# Patient Record
Sex: Female | Born: 1935
Health system: Southern US, Community
[De-identification: ages and names within clinical notes are randomized; demographics above are authoritative.]

## PROBLEM LIST (undated history)

## (undated) DIAGNOSIS — M8580 Other specified disorders of bone density and structure, unspecified site: Secondary | ICD-10-CM

## (undated) DIAGNOSIS — I10 Essential (primary) hypertension: Secondary | ICD-10-CM

## (undated) DIAGNOSIS — G2581 Restless legs syndrome: Secondary | ICD-10-CM

## (undated) DIAGNOSIS — Q211 Atrial septal defect, unspecified: Secondary | ICD-10-CM

## (undated) DIAGNOSIS — M1990 Unspecified osteoarthritis, unspecified site: Secondary | ICD-10-CM

## (undated) DIAGNOSIS — F329 Major depressive disorder, single episode, unspecified: Secondary | ICD-10-CM

## (undated) DIAGNOSIS — Z860100 Personal history of colon polyps, unspecified: Secondary | ICD-10-CM

## (undated) DIAGNOSIS — F419 Anxiety disorder, unspecified: Secondary | ICD-10-CM

## (undated) DIAGNOSIS — M109 Gout, unspecified: Secondary | ICD-10-CM

## (undated) DIAGNOSIS — D649 Anemia, unspecified: Secondary | ICD-10-CM

## (undated) HISTORY — DX: Personal history of colonic polyps: Z86.010

## (undated) HISTORY — PX: TONSILLECTOMY: SUR1361

## (undated) HISTORY — DX: Gout, unspecified: M10.9

## (undated) HISTORY — DX: Anxiety disorder, unspecified: F41.9

## (undated) HISTORY — DX: Personal history of colon polyps, unspecified: Z86.0100

## (undated) HISTORY — DX: Other specified disorders of bone density and structure, unspecified site: M85.80

## (undated) HISTORY — DX: Essential (primary) hypertension: I10

## (undated) HISTORY — DX: Atrial septal defect, unspecified: Q21.10

## (undated) HISTORY — DX: Atrial septal defect: Q21.1

## (undated) HISTORY — DX: Unspecified osteoarthritis, unspecified site: M19.90

## (undated) HISTORY — PX: BREAST SURGERY: SHX581

## (undated) HISTORY — DX: Major depressive disorder, single episode, unspecified: F32.9

## (undated) HISTORY — DX: Restless legs syndrome: G25.81

## (undated) HISTORY — DX: Anemia, unspecified: D64.9

---

## 1998-11-13 HISTORY — PX: BACK SURGERY: SHX140

## 2004-11-13 LAB — HM COLONOSCOPY: HM Colonoscopy: NORMAL

## 2005-08-14 ENCOUNTER — Ambulatory Visit (HOSPITAL_COMMUNITY): Admission: RE | Admit: 2005-08-14 | Discharge: 2005-08-14 | Payer: Self-pay | Admitting: Gastroenterology

## 2006-06-11 ENCOUNTER — Ambulatory Visit: Payer: Self-pay | Admitting: Internal Medicine

## 2006-08-09 ENCOUNTER — Encounter: Payer: Self-pay | Admitting: Internal Medicine

## 2006-09-10 ENCOUNTER — Ambulatory Visit: Payer: Self-pay | Admitting: Internal Medicine

## 2006-09-18 ENCOUNTER — Ambulatory Visit: Payer: Self-pay | Admitting: Cardiovascular Disease

## 2006-09-26 ENCOUNTER — Ambulatory Visit: Payer: Self-pay | Admitting: Internal Medicine

## 2006-10-09 ENCOUNTER — Ambulatory Visit: Payer: Self-pay

## 2006-10-09 ENCOUNTER — Encounter: Payer: Self-pay | Admitting: Cardiology

## 2006-10-23 ENCOUNTER — Ambulatory Visit: Payer: Self-pay | Admitting: Cardiovascular Disease

## 2006-10-31 ENCOUNTER — Ambulatory Visit: Payer: Self-pay | Admitting: Cardiovascular Disease

## 2006-10-31 ENCOUNTER — Inpatient Hospital Stay (HOSPITAL_BASED_OUTPATIENT_CLINIC_OR_DEPARTMENT_OTHER): Admission: RE | Admit: 2006-10-31 | Discharge: 2006-10-31 | Payer: Self-pay | Admitting: Cardiovascular Disease

## 2006-11-01 ENCOUNTER — Ambulatory Visit: Payer: Self-pay | Admitting: Cardiovascular Disease

## 2006-12-11 ENCOUNTER — Ambulatory Visit: Payer: Self-pay | Admitting: Internal Medicine

## 2006-12-31 ENCOUNTER — Ambulatory Visit: Payer: Self-pay | Admitting: Cardiovascular Disease

## 2007-01-01 ENCOUNTER — Other Ambulatory Visit: Admission: RE | Admit: 2007-01-01 | Discharge: 2007-01-01 | Payer: Self-pay | Admitting: Internal Medicine

## 2007-01-01 ENCOUNTER — Ambulatory Visit: Payer: Self-pay | Admitting: Internal Medicine

## 2007-01-01 ENCOUNTER — Encounter: Payer: Self-pay | Admitting: Internal Medicine

## 2007-01-01 LAB — CONVERTED CEMR LAB
ALT: 14 units/L (ref 0–40)
AST: 24 units/L (ref 0–37)
Basophils Relative: 1.7 % — ABNORMAL HIGH (ref 0.0–1.0)
Cholesterol: 190 mg/dL (ref 0–200)
HCT: 34.9 % — ABNORMAL LOW (ref 36.0–46.0)
HDL: 69.8 mg/dL (ref >39.0)
Hemoglobin: 11.9 g/dL — ABNORMAL LOW (ref 12.0–15.0)
Iron: 96 ug/dL (ref 42–145)
LDL Cholesterol: 107 mg/dL — ABNORMAL HIGH (ref 0–99)
Lymphocytes Relative: 31.4 % (ref 12.0–46.0)
MCHC: 34 g/dL (ref 30.0–36.0)
Monocytes Absolute: 0.4 10*3/uL (ref 0.2–0.7)
Monocytes Relative: 11.1 % — ABNORMAL HIGH (ref 3.0–11.0)
Neutro Abs: 1.6 10*3/uL (ref 1.4–7.7)
Neutrophils Relative %: 53.8 % (ref 43.0–77.0)
Pap Smear: NORMAL
RDW: 13.3 % (ref 11.5–14.6)

## 2007-01-22 ENCOUNTER — Ambulatory Visit: Payer: Self-pay | Admitting: Internal Medicine

## 2007-04-15 DIAGNOSIS — I10 Essential (primary) hypertension: Secondary | ICD-10-CM

## 2007-04-15 DIAGNOSIS — Z860100 Personal history of colon polyps, unspecified: Secondary | ICD-10-CM | POA: Insufficient documentation

## 2007-04-15 DIAGNOSIS — M8580 Other specified disorders of bone density and structure, unspecified site: Secondary | ICD-10-CM

## 2007-04-23 ENCOUNTER — Ambulatory Visit: Payer: Self-pay | Admitting: Internal Medicine

## 2007-04-23 DIAGNOSIS — I429 Cardiomyopathy, unspecified: Secondary | ICD-10-CM

## 2007-04-23 LAB — CONVERTED CEMR LAB: Rh Type: POSITIVE

## 2007-04-25 LAB — CONVERTED CEMR LAB
BUN: 26 mg/dL — ABNORMAL HIGH (ref 6–23)
Calcium: 10.6 mg/dL — ABNORMAL HIGH (ref 8.4–10.5)
Chloride: 104 meq/L (ref 96–112)
GFR calc Af Amer: 62 mL/min
GFR calc non Af Amer: 51 mL/min

## 2007-06-06 ENCOUNTER — Telehealth (INDEPENDENT_AMBULATORY_CARE_PROVIDER_SITE_OTHER): Payer: Self-pay | Admitting: *Deleted

## 2007-06-18 ENCOUNTER — Ambulatory Visit: Payer: Self-pay | Admitting: Cardiovascular Disease

## 2007-06-18 LAB — CONVERTED CEMR LAB
BUN: 26 mg/dL — ABNORMAL HIGH (ref 6–23)
Calcium: 10 mg/dL (ref 8.4–10.5)
GFR calc Af Amer: 56 mL/min
GFR calc non Af Amer: 46 mL/min
Potassium: 4.3 meq/L (ref 3.5–5.1)

## 2007-06-28 ENCOUNTER — Ambulatory Visit: Payer: Self-pay

## 2007-07-04 ENCOUNTER — Ambulatory Visit: Payer: Self-pay | Admitting: Cardiovascular Disease

## 2007-08-10 ENCOUNTER — Emergency Department (HOSPITAL_COMMUNITY): Admission: EM | Admit: 2007-08-10 | Discharge: 2007-08-10 | Payer: Self-pay | Admitting: Emergency Medicine

## 2007-08-26 ENCOUNTER — Encounter: Payer: Self-pay | Admitting: Internal Medicine

## 2007-08-28 ENCOUNTER — Telehealth (INDEPENDENT_AMBULATORY_CARE_PROVIDER_SITE_OTHER): Payer: Self-pay | Admitting: *Deleted

## 2007-10-08 ENCOUNTER — Telehealth: Payer: Self-pay | Admitting: Internal Medicine

## 2007-10-21 ENCOUNTER — Ambulatory Visit: Payer: Self-pay | Admitting: Internal Medicine

## 2007-10-21 ENCOUNTER — Telehealth: Payer: Self-pay | Admitting: Internal Medicine

## 2007-10-23 ENCOUNTER — Encounter: Payer: Self-pay | Admitting: Internal Medicine

## 2007-11-12 ENCOUNTER — Ambulatory Visit: Payer: Self-pay

## 2007-11-12 ENCOUNTER — Encounter: Payer: Self-pay | Admitting: Internal Medicine

## 2007-12-03 ENCOUNTER — Telehealth (INDEPENDENT_AMBULATORY_CARE_PROVIDER_SITE_OTHER): Payer: Self-pay | Admitting: *Deleted

## 2007-12-19 ENCOUNTER — Encounter: Payer: Self-pay | Admitting: Internal Medicine

## 2008-01-23 ENCOUNTER — Telehealth (INDEPENDENT_AMBULATORY_CARE_PROVIDER_SITE_OTHER): Payer: Self-pay | Admitting: *Deleted

## 2008-01-24 ENCOUNTER — Ambulatory Visit: Payer: Self-pay | Admitting: Internal Medicine

## 2008-01-24 DIAGNOSIS — R21 Rash and other nonspecific skin eruption: Secondary | ICD-10-CM | POA: Insufficient documentation

## 2008-03-09 ENCOUNTER — Ambulatory Visit: Payer: Self-pay | Admitting: Internal Medicine

## 2008-03-11 ENCOUNTER — Encounter (INDEPENDENT_AMBULATORY_CARE_PROVIDER_SITE_OTHER): Payer: Self-pay | Admitting: *Deleted

## 2008-03-12 ENCOUNTER — Telehealth (INDEPENDENT_AMBULATORY_CARE_PROVIDER_SITE_OTHER): Payer: Self-pay | Admitting: *Deleted

## 2008-03-13 ENCOUNTER — Ambulatory Visit: Payer: Self-pay | Admitting: Internal Medicine

## 2008-03-13 LAB — CONVERTED CEMR LAB
AST: 25 units/L (ref 0–37)
Basophils Absolute: 0 10*3/uL (ref 0.0–0.1)
Basophils Relative: 0.2 % (ref 0.0–1.0)
CO2: 31 meq/L (ref 19–32)
Calcium: 9.8 mg/dL (ref 8.4–10.5)
Cholesterol: 177 mg/dL (ref 0–200)
Creatinine, Ser: 1.1 mg/dL (ref 0.4–1.2)
GFR calc Af Amer: 62 mL/min
HCT: 34.5 % — ABNORMAL LOW (ref 36.0–46.0)
Hemoglobin: 11.7 g/dL — ABNORMAL LOW (ref 12.0–15.0)
LDL Cholesterol: 98 mg/dL (ref 0–99)
Lymphocytes Relative: 34.7 % (ref 12.0–46.0)
MCHC: 34 g/dL (ref 30.0–36.0)
MCV: 97.4 fL (ref 78.0–100.0)
Monocytes Absolute: 0.4 10*3/uL (ref 0.1–1.0)
Neutro Abs: 2.2 10*3/uL (ref 1.4–7.7)
RBC: 3.54 M/uL — ABNORMAL LOW (ref 3.87–5.11)
Triglycerides: 43 mg/dL (ref 0–149)

## 2008-03-30 ENCOUNTER — Ambulatory Visit: Payer: Self-pay | Admitting: Internal Medicine

## 2008-03-30 LAB — CONVERTED CEMR LAB
OCCULT 1: NEGATIVE
OCCULT 2: NEGATIVE

## 2008-04-03 ENCOUNTER — Encounter (INDEPENDENT_AMBULATORY_CARE_PROVIDER_SITE_OTHER): Payer: Self-pay | Admitting: *Deleted

## 2008-04-13 ENCOUNTER — Encounter (INDEPENDENT_AMBULATORY_CARE_PROVIDER_SITE_OTHER): Payer: Self-pay | Admitting: *Deleted

## 2008-04-14 ENCOUNTER — Telehealth (INDEPENDENT_AMBULATORY_CARE_PROVIDER_SITE_OTHER): Payer: Self-pay | Admitting: *Deleted

## 2008-04-15 ENCOUNTER — Ambulatory Visit: Payer: Self-pay | Admitting: Internal Medicine

## 2008-05-04 ENCOUNTER — Telehealth (INDEPENDENT_AMBULATORY_CARE_PROVIDER_SITE_OTHER): Payer: Self-pay | Admitting: *Deleted

## 2008-05-07 ENCOUNTER — Encounter (INDEPENDENT_AMBULATORY_CARE_PROVIDER_SITE_OTHER): Payer: Self-pay | Admitting: *Deleted

## 2008-06-23 ENCOUNTER — Ambulatory Visit: Payer: Self-pay | Admitting: Cardiovascular Disease

## 2008-08-04 ENCOUNTER — Encounter (INDEPENDENT_AMBULATORY_CARE_PROVIDER_SITE_OTHER): Payer: Self-pay | Admitting: *Deleted

## 2008-08-11 ENCOUNTER — Telehealth (INDEPENDENT_AMBULATORY_CARE_PROVIDER_SITE_OTHER): Payer: Self-pay | Admitting: *Deleted

## 2008-08-31 ENCOUNTER — Encounter: Payer: Self-pay | Admitting: Internal Medicine

## 2008-08-31 LAB — HM MAMMOGRAPHY: HM Mammogram: NORMAL

## 2008-09-07 ENCOUNTER — Encounter (INDEPENDENT_AMBULATORY_CARE_PROVIDER_SITE_OTHER): Payer: Self-pay | Admitting: *Deleted

## 2008-09-08 ENCOUNTER — Ambulatory Visit: Payer: Self-pay | Admitting: Internal Medicine

## 2008-09-10 LAB — CONVERTED CEMR LAB
GFR calc Af Amer: 56 mL/min
GFR calc non Af Amer: 46 mL/min
Potassium: 4.2 meq/L (ref 3.5–5.1)
Saturation Ratios: 26 % (ref 20.0–50.0)
Sodium: 142 meq/L (ref 135–145)
Transferrin: 272.1 mg/dL (ref >=212.0)

## 2008-09-11 ENCOUNTER — Encounter (INDEPENDENT_AMBULATORY_CARE_PROVIDER_SITE_OTHER): Payer: Self-pay | Admitting: *Deleted

## 2008-09-18 ENCOUNTER — Encounter: Payer: Self-pay | Admitting: Internal Medicine

## 2008-10-12 ENCOUNTER — Encounter: Payer: Self-pay | Admitting: Internal Medicine

## 2008-12-11 ENCOUNTER — Ambulatory Visit: Payer: Self-pay | Admitting: Family Medicine

## 2008-12-17 ENCOUNTER — Emergency Department (HOSPITAL_COMMUNITY): Admission: EM | Admit: 2008-12-17 | Discharge: 2008-12-18 | Payer: Self-pay | Admitting: Emergency Medicine

## 2008-12-23 ENCOUNTER — Encounter: Payer: Self-pay | Admitting: Internal Medicine

## 2008-12-31 ENCOUNTER — Encounter: Payer: Self-pay | Admitting: Internal Medicine

## 2009-01-07 ENCOUNTER — Encounter: Payer: Self-pay | Admitting: Internal Medicine

## 2009-02-10 ENCOUNTER — Encounter: Payer: Self-pay | Admitting: Internal Medicine

## 2009-03-09 ENCOUNTER — Ambulatory Visit: Payer: Self-pay | Admitting: Internal Medicine

## 2009-03-15 LAB — CONVERTED CEMR LAB
BUN: 31 mg/dL — ABNORMAL HIGH (ref 6–23)
Calcium: 10.4 mg/dL (ref 8.4–10.5)
Cholesterol: 205 mg/dL — ABNORMAL HIGH (ref 0–200)
Creatinine, Ser: 1.3 mg/dL — ABNORMAL HIGH (ref 0.4–1.2)
GFR calc non Af Amer: 50.72 mL/min (ref >60)

## 2009-05-28 ENCOUNTER — Encounter: Payer: Self-pay | Admitting: Internal Medicine

## 2009-07-10 DIAGNOSIS — G2581 Restless legs syndrome: Secondary | ICD-10-CM

## 2009-07-10 HISTORY — DX: Restless legs syndrome: G25.81

## 2009-07-16 DIAGNOSIS — F329 Major depressive disorder, single episode, unspecified: Secondary | ICD-10-CM | POA: Insufficient documentation

## 2009-07-16 DIAGNOSIS — F419 Anxiety disorder, unspecified: Secondary | ICD-10-CM

## 2009-07-16 DIAGNOSIS — F32A Depression, unspecified: Secondary | ICD-10-CM

## 2009-07-16 HISTORY — DX: Depression, unspecified: F32.A

## 2009-07-16 HISTORY — DX: Anxiety disorder, unspecified: F41.9

## 2009-07-20 ENCOUNTER — Ambulatory Visit: Payer: Self-pay | Admitting: Cardiovascular Disease

## 2009-07-20 DIAGNOSIS — I493 Ventricular premature depolarization: Secondary | ICD-10-CM

## 2009-07-30 ENCOUNTER — Telehealth: Payer: Self-pay | Admitting: Cardiovascular Disease

## 2009-08-04 ENCOUNTER — Ambulatory Visit: Payer: Self-pay

## 2009-08-04 ENCOUNTER — Encounter: Payer: Self-pay | Admitting: Cardiovascular Disease

## 2009-08-09 ENCOUNTER — Telehealth: Payer: Self-pay | Admitting: Cardiovascular Disease

## 2009-08-12 ENCOUNTER — Ambulatory Visit: Payer: Self-pay | Admitting: Internal Medicine

## 2009-08-19 ENCOUNTER — Encounter (INDEPENDENT_AMBULATORY_CARE_PROVIDER_SITE_OTHER): Payer: Self-pay | Admitting: *Deleted

## 2009-08-19 LAB — CONVERTED CEMR LAB
Basophils Absolute: 0.1 10*3/uL (ref 0.0–0.1)
CO2: 30 meq/L (ref 19–32)
GFR calc non Af Amer: 50.67 mL/min (ref >60)
Glucose, Bld: 76 mg/dL (ref 70–99)
HCT: 36.2 % (ref 36.0–46.0)
Hemoglobin: 12.4 g/dL (ref 12.0–15.0)
LDL Cholesterol: 117 mg/dL — ABNORMAL HIGH (ref 0–99)
Lymphs Abs: 1.9 10*3/uL (ref 0.7–4.0)
MCHC: 34.2 g/dL (ref 30.0–36.0)
Monocytes Relative: 10.5 % (ref 3.0–12.0)
Neutro Abs: 2.5 10*3/uL (ref 1.4–7.7)
Potassium: 4.8 meq/L (ref 3.5–5.1)
RDW: 13 % (ref 11.5–14.6)
Sodium: 141 meq/L (ref 135–145)
VLDL: 11.8 mg/dL (ref 0.0–40.0)

## 2009-09-01 ENCOUNTER — Encounter: Payer: Self-pay | Admitting: Internal Medicine

## 2009-12-01 ENCOUNTER — Ambulatory Visit: Payer: Self-pay | Admitting: Cardiovascular Disease

## 2010-02-09 ENCOUNTER — Ambulatory Visit: Payer: Self-pay | Admitting: Internal Medicine

## 2010-02-09 DIAGNOSIS — M1990 Unspecified osteoarthritis, unspecified site: Secondary | ICD-10-CM

## 2010-02-12 LAB — CONVERTED CEMR LAB
BUN: 37 mg/dL — ABNORMAL HIGH (ref 6–23)
CO2: 29 meq/L (ref 19–32)
Calcium: 10.2 mg/dL (ref 8.4–10.5)
Chloride: 103 meq/L (ref 96–112)
Creatinine, Ser: 1.3 mg/dL — ABNORMAL HIGH (ref 0.4–1.2)

## 2010-02-28 ENCOUNTER — Telehealth (INDEPENDENT_AMBULATORY_CARE_PROVIDER_SITE_OTHER): Payer: Self-pay | Admitting: *Deleted

## 2010-04-25 ENCOUNTER — Telehealth: Payer: Self-pay | Admitting: Internal Medicine

## 2010-06-29 ENCOUNTER — Encounter: Payer: Self-pay | Admitting: Internal Medicine

## 2010-06-29 ENCOUNTER — Ambulatory Visit: Payer: Self-pay | Admitting: Internal Medicine

## 2010-07-25 ENCOUNTER — Encounter: Payer: Self-pay | Admitting: Internal Medicine

## 2010-08-03 ENCOUNTER — Other Ambulatory Visit: Admission: RE | Admit: 2010-08-03 | Discharge: 2010-08-03 | Payer: Self-pay | Admitting: Internal Medicine

## 2010-08-03 ENCOUNTER — Ambulatory Visit: Payer: Self-pay | Admitting: Internal Medicine

## 2010-08-03 DIAGNOSIS — L989 Disorder of the skin and subcutaneous tissue, unspecified: Secondary | ICD-10-CM | POA: Insufficient documentation

## 2010-08-03 LAB — CONVERTED CEMR LAB: Vit D, 25-Hydroxy: 64 ng/mL (ref 30–89)

## 2010-08-03 LAB — HM PAP SMEAR: HM Pap smear: NORMAL

## 2010-08-08 LAB — CONVERTED CEMR LAB
ALT: 11 units/L (ref 0–35)
Chloride: 105 meq/L (ref 96–112)
Direct LDL: 123.7 mg/dL
GFR calc non Af Amer: 53.38 mL/min (ref >60)
Hemoglobin: 11.6 g/dL — ABNORMAL LOW (ref 12.0–15.0)
Pap Smear: NEGATIVE
Potassium: 4.1 meq/L (ref 3.5–5.1)
Sodium: 141 meq/L (ref 135–145)
Total CHOL/HDL Ratio: 3

## 2010-08-10 ENCOUNTER — Encounter: Payer: Self-pay | Admitting: Internal Medicine

## 2010-08-30 ENCOUNTER — Encounter: Payer: Self-pay | Admitting: Internal Medicine

## 2010-08-30 LAB — HM COLONOSCOPY

## 2010-09-07 ENCOUNTER — Encounter: Payer: Self-pay | Admitting: Internal Medicine

## 2010-09-12 ENCOUNTER — Encounter: Payer: Self-pay | Admitting: Internal Medicine

## 2010-09-27 ENCOUNTER — Telehealth: Payer: Self-pay | Admitting: Internal Medicine

## 2010-10-26 ENCOUNTER — Telehealth: Payer: Self-pay | Admitting: Internal Medicine

## 2010-12-04 ENCOUNTER — Encounter: Payer: Self-pay | Admitting: Cardiovascular Disease

## 2010-12-05 ENCOUNTER — Ambulatory Visit
Admission: RE | Admit: 2010-12-05 | Discharge: 2010-12-05 | Payer: Self-pay | Source: Home / Self Care | Attending: Internal Medicine | Admitting: Internal Medicine

## 2010-12-07 ENCOUNTER — Ambulatory Visit: Admission: RE | Admit: 2010-12-07 | Discharge: 2010-12-07 | Payer: Self-pay | Source: Home / Self Care

## 2010-12-07 ENCOUNTER — Encounter: Payer: Self-pay | Admitting: Cardiovascular Disease

## 2010-12-12 ENCOUNTER — Ambulatory Visit: Admission: RE | Admit: 2010-12-12 | Discharge: 2010-12-12 | Payer: Self-pay | Source: Home / Self Care

## 2010-12-12 ENCOUNTER — Ambulatory Visit (HOSPITAL_COMMUNITY)
Admission: RE | Admit: 2010-12-12 | Discharge: 2010-12-12 | Payer: Self-pay | Source: Home / Self Care | Attending: Cardiovascular Disease | Admitting: Cardiovascular Disease

## 2010-12-13 NOTE — Progress Notes (Signed)
Summary: venlafaxine rx should be xr cap NOT Venlafaxine hcl tab  Phone Note From Pharmacy   Caller: [facility]bbie [facility] pharmacist [facility] [address]  Summary of Call: pt questioning rx Venlafaxine rx. Has always been on the xr capsules, now rx given was for Venlafaxine Hcl tab. Per 07/20/09 med list rx was changed by cardilogy at office visit. Pt has always been on the XR capsule.  Pharmacy informed and rx changed back to xr Initial call taken by: Kandice Hams,  February 28, 2010 4:20 PM    New/Updated Medications: VENLAFAXINE HCL 75 MG XR24H-CAP (VENLAFAXINE HCL) 1 by mouth once daily

## 2010-12-13 NOTE — Progress Notes (Signed)
Summary: Refill Request  Phone Note Refill Request Call back at [phone] Message from:  Pharmacy on April 25, 2010 10:09 AM  Refills Requested: Medication #1:  KLONOPIN 0.5 MG TABS Take 1/2 tablet by mouth at bedtime   Dosage confirmed as above?Dosage Confirmed   Supply Requested: 1 month   Last Refilled: 02/23/2010 [facility]  Next Appointment Scheduled: Sept 21st 2011 Initial call taken by: Harold Barban,  April 25, 2010 10:10 AM  Follow-up for Phone Call        last filled 09-02-09 #30 3 refills, last OV 02-09-29 .....Marland KitchenMarland KitchenFelecia Deloach CMA  April 25, 2010 10:19 AM   ok 30 and 3 RF Jose E. Paz MD  April 25, 2010 11:24 AM      Prescriptions: KLONOPIN 0.5 MG TABS (CLONAZEPAM) Take 1/2 tablet by mouth at bedtime  #30 x 3   Entered by:   Jeremy Johann CMA   Authorized by:   Nolon Rod. Paz MD   Signed by:   Jeremy Johann CMA on 04/25/2010   Method used:   Printed then faxed to ...       [facility] (retail)       [phone]       [place], [place]  [address]       Ph: [phone]       Fax: [phone]   RxID:   [id number]

## 2010-12-13 NOTE — Assessment & Plan Note (Signed)
Summary: 6 MONTH FOLLOWUP/ALR   Vital Signs:  Patient profile:   77 year old female Height:      64 inches Weight:      145.8 pounds BMI:     25.12 Pulse rate:   70 / minute BP sitting:   124 / 62  Vitals Entered By: Shary Decamp (February 09, 2010 10:02 AM) CC: rov - fasting   History of Present Illness: routine office visit CARDIOMYOPATHY--cardiology note from January reviewed, stable    Current Medications (verified): 1)  Lisinopril-Hydrochlorothiazide 20-12.5 Mg Tabs (Lisinopril-Hydrochlorothiazide) .... Take 2 Tablet By Mouth Once A Day 2)  Losartan Potassium 100 Mg Tabs (Losartan Potassium) .Marland Kitchen.. 1 Tab By Mouth Once Daily 3)  Klonopin 0.5 Mg Tabs (Clonazepam) .... Take 1/2 Tablet By Mouth At Bedtime 4)  Venlafaxine Hcl 75 Mg Tabs (Venlafaxine Hcl) .Marland Kitchen.. 1 Tab By Mouth Once Daily 5)  Mirtazapine 30 Mg Tabs (Mirtazapine) .... 2by Mouth At Bedtime [phone] 6)  Aspirin 325 Mg Tabs (Aspirin) .Marland Kitchen.. 1 Tab By Mouth Once Daily 7)  Multivitamins  Tabs (Multiple Vitamin) .Marland Kitchen.. 1 Tab By Mouth Once Daily 8)  Calcium Carbonate 600 Mg Tabs (Calcium Carbonate) .... 2 By Mouth Qd 9)  Metamucil 30.9 % Powd (Psyllium) .... As Needed 10)  Echinacea (During Flu Season) .... Seasonal As Needed 11)  Vitamin D .... 1 Tab By Mouth Once Daily 12)  Coreg 12.5 Mg Tabs (Carvedilol) .... Take One Tablet By Mouth Twice A Day 13)  Voltaren 1 % Gel (Diclofenac Sodium) .... As Directed  Allergies (verified): 1)  ! Codeine 2)  ! Hydrocodone 3)  ! Adhesive Tape 4)  ! * Shellfish  Past History:  Past Medical History: CARDIOMYOPATHY nonischemic cardiomyopathy.  She had a normal cath in December 2007, followup MUGA scan showed an EF improving to 52% from 40%.  h/o ASD per Bubble study 2004     Colonic polyps, hx of  Depression  Hypertension  Osteopenia Anemia  Osteoarthritis  Past Surgical History: Reviewed history from 08/12/2009 and no changes required. back surgery --2000--due to a fall Breast  Bx (-) tonsillectomy   Social History: Reviewed history from 03/09/2008 and no changes required. Never Smoked Alcohol use-no Widow , moved to [place] 2007 (has a daughter in town) Live by self motivational speaker  Review of Systems       feels very well more active in the last few months, has noted some weight loss her diet remains excellent, she eats a lot of fruits, vegetables, fish denies chest pain, lower extremity edema or difficulty breathing sometimes her DIPs  in both hands is a little numb, symptoms decrease with massage.  Denies redness or puffiness in any of her hands joints or wrists;no actual  numbness in her hands  Physical Exam  General:  alert, well-developed, and well-nourished.   Lungs:  normal respiratory effort, no intercostal retractions, no accessory muscle use, and normal breath sounds.   Heart:  normal rate, regular rhythm, and no murmur.   Extremities:  no lower extremity edema inspection on palpation of the hands and wrists is normal except for some changes consistent with OA, no puffiness or redness   Impression & Recommendations:  Problem # 1:  OSTEOARTHRITIS (ICD-715.90) finger numbness likely OA, observe Her updated medication list for this problem includes:    Aspirin 325 Mg Tabs (Aspirin) .Marland Kitchen... 1 tab by mouth once daily  Problem # 2:  HYPERTENSION (ICD-401.9) at goal  Her updated medication list for this problem  includes:    Lisinopril-hydrochlorothiazide 20-12.5 Mg Tabs (Lisinopril-hydrochlorothiazide) .Marland Kitchen... Take 2 tablet by mouth once a day    Losartan Potassium 100 Mg Tabs (Losartan potassium) .Marland Kitchen... 1 tab by mouth once daily    Coreg 12.5 Mg Tabs (Carvedilol) .Marland Kitchen... Take one tablet by mouth twice a day  BP today: 124/62 Prior BP: 126/75 (12/01/2009)  Labs Reviewed: K+: 4.8 (08/12/2009) Creat: : 1.3 (08/12/2009)   Chol: 187 (08/12/2009)   HDL: 57.90 (08/12/2009)   LDL: 117 (08/12/2009)   TG: 59.0 (08/12/2009)  Orders: Venipuncture  ([address]) TLB-BMP (Basic Metabolic Panel-BMET) (80048-METABOL)  Problem # 3:  CARDIOMYOPATHY (ICD-425.4) stable   Complete Medication List: 1)  Lisinopril-hydrochlorothiazide 20-12.5 Mg Tabs (Lisinopril-hydrochlorothiazide) .... Take 2 tablet by mouth once a day 2)  Losartan Potassium 100 Mg Tabs (Losartan potassium) .Marland Kitchen.. 1 tab by mouth once daily 3)  Coreg 12.5 Mg Tabs (Carvedilol) .... Take one tablet by mouth twice a day 4)  Klonopin 0.5 Mg Tabs (Clonazepam) .... Take 1/2 tablet by mouth at bedtime 5)  Venlafaxine Hcl 75 Mg Tabs (Venlafaxine hcl) .Marland Kitchen.. 1 tab by mouth once daily 6)  Mirtazapine 30 Mg Tabs (Mirtazapine) .... 2by mouth at bedtime [phone] 7)  Aspirin 325 Mg Tabs (Aspirin) .Marland Kitchen.. 1 tab by mouth once daily 8)  Multivitamins Tabs (Multiple vitamin) .Marland Kitchen.. 1 tab by mouth once daily 9)  Calcium Carbonate 600 Mg Tabs (Calcium carbonate) .... 2 by mouth qd 10)  Metamucil 30.9 % Powd (Psyllium) .... As needed 11)  Echinacea (during Flu Season)  .... Seasonal as needed 12)  Vitamin D  .... 1 tab by mouth once daily 13)  Voltaren 1 % Gel (Diclofenac sodium) .... As directed  Patient Instructions: 1)  Please schedule a follow-up appointment in 6 months ( fasting, Yearly checkup) Prescriptions: VENLAFAXINE HCL 75 MG TABS (VENLAFAXINE HCL) 1 tab by mouth once daily  #30 Capsule x 6   Entered by:   Shary Decamp   Authorized by:   Nolon Rod. Paz MD   Signed by:   Shary Decamp on 02/09/2010   Method used:   Electronically to        [id number] (retail)       [address]       [place], [place]  [address]       Ph: [phone]       Fax: [phone]   RxID:   [id number] COREG 12.5 MG TABS (CARVEDILOL) take one tablet by mouth twice a day  #60 x 6   Entered by:   Shary Decamp   Authorized by:   Nolon Rod. Paz MD   Signed by:   Shary Decamp on 02/09/2010   Method used:   Electronically to        [id number] (retail)       [address]       [place], [place]  [address]       Ph: [phone]       Fax: [phone]   RxID:   [id number] LISINOPRIL-HYDROCHLOROTHIAZIDE 20-12.5 MG TABS (LISINOPRIL-HYDROCHLOROTHIAZIDE) Take 2 tablet by mouth once a day  #60 Tablet x 6   Entered by:   Shary Decamp   Authorized by:   Nolon Rod. Paz MD   Signed by:   Shary Decamp on 02/09/2010   Method used:   Electronically to        [id number] (retail)  [address]       [place], [place]  [address]       Ph: [phone]       Fax: [phone]   RxID:   [id number]

## 2010-12-13 NOTE — Progress Notes (Signed)
Summary: Question on meds  Phone Note Call from Patient Call back at Home Phone [phone]   Summary of Call: Patient called a little confused about being on Losartan and Lisinopril. The pharmacist mentioned the meds and she wanted to be sure she was taking them. I made the patient aware to continue the meds (she has been on both for a great while) and call us if she has any problems. She notes that she feels fine and expressed understanding. Initial call taken by: Lucious Groves CMA,  September 27, 2010 11:13 AM

## 2010-12-13 NOTE — Letter (Signed)
Summary: return PRN  [facility]   Imported By: Lanelle Bal 08/04/2010 08:27:12  _____________________________________________________________________  External Attachment:    Type:   Image     Comment:   External Document

## 2010-12-13 NOTE — Assessment & Plan Note (Signed)
Summary: cpx/kdc   Vital Signs:  Patient profile:   77 year old female Height:      64 inches Weight:      145.13 pounds Pulse rate:   81 / minute Pulse rhythm:   regular BP sitting:   126 / 88  (left arm) Cuff size:   large  Vitals Entered By: Army Fossa CMA (August 03, 2010 9:01 AM) CC: CPX, fasting Comments her BP cuff read 134/81 pap if insurance will cover- has medicare flu shot pharm- [facility]   History of Present Illness: Here for Medicare AWV: 1.   Risk factors based on Past M, S, F history: reviewed  2.   Physical Activities: [facility] the [facility]  3.   Depression/mood: symptoms well controlled w/ effexor  4.   Hearing: no problems reported or noted  5.   ADL's: totally independent  6.   Fall Risk: no recent fall, low risk  7.   Home Safety: does feel safe ar home  8.   Height, weight, &visual acuity: see VS, saw the eye doctor 2 days ago, vision well corrected                w/ glasses  9.   Counseling: yes, see a/p  10.   Labs ordered based on risk factors: yes  11.           Referral Coordination: yes , see below  12.           Care Plan:see a/p  13.            Cognitive Assessment.. motor skills, memory and cognition seemed appropriate    in addition we discussed the following issues --has 2 skin lesion , new x few months -- saw urology few days ago in ref. to abnormal urinary stream, a bladder scan was done and it was ok. in the past she used a hormonal cream w/o much help and it was expensive. She is to return to urology as needed; no h/o recurrent UTIs    --Colonic polyps, got a letter from GI...should I proceed w/ Cscope? -- Hypertension-- ambulatory BPs 120-130/70 -- Osteopenia, records reviewed, DEXA normal 06-2010      Preventive Screening-Counseling & Management  Caffeine-Diet-Exercise     Does Patient Exercise: yes     Times/week: 3  Current Medications (verified): 1)  Lisinopril-Hydrochlorothiazide 20-12.5 Mg Tabs  (Lisinopril-Hydrochlorothiazide) .... Take 2 Tablet By Mouth Once A Day 2)  Losartan Potassium 100 Mg Tabs (Losartan Potassium) .Marland Kitchen.. 1 Tab By Mouth Once Daily 3)  Coreg 12.5 Mg Tabs (Carvedilol) .... Take One Tablet By Mouth Twice A Day 4)  Klonopin 0.5 Mg Tabs (Clonazepam) .... Take 1/2 Tablet By Mouth At Bedtime 5)  Venlafaxine Hcl 75 Mg Xr24h-Cap (Venlafaxine Hcl) .Marland Kitchen.. 1 By Mouth Once Daily 6)  Mirtazapine 30 Mg Tabs (Mirtazapine) .... 2by Mouth At Bedtime [phone] 7)  Aspirin 325 Mg Tabs (Aspirin) .Marland Kitchen.. 1 Tab By Mouth Once Daily 8)  Multivitamins  Tabs (Multiple Vitamin) .Marland Kitchen.. 1 Tab By Mouth Once Daily 9)  Calcium Carbonate 600 Mg Tabs (Calcium Carbonate) .... 2 By Mouth Qd 10)  Metamucil 30.9 % Powd (Psyllium) .... As Needed 11)  Echinacea (During Flu Season) .... Seasonal As Needed 12)  Vitamin D .... 1 Tab By Mouth Once Daily  Allergies: 1)  ! Codeine 2)  ! Hydrocodone 3)  ! Adhesive Tape 4)  ! * Shellfish  Past History:  Past Medical History: CARDIOMYOPATHY  nonischemic cardiomyopathy.  She had a normal cath in December 2007, followup MUGA scan showed an EF improving to 52% from 40%.  h/o ASD per Bubble study 2004     Colonic polyps, hx of  Depression  Hypertension  Osteopenia Anemia  Osteoarthritis  Past Surgical History: Reviewed history from 08/12/2009 and no changes required. back surgery --2000--due to a fall Breast Bx (-) tonsillectomy   Family History: Reviewed history from 08/12/2009 and no changes required. Hypertension... + Heart disease--MI F and 2 Brothers DM--M  and mother side  colon ca--no breast ca--no  Social History: Never Smoked Alcohol use-no Widow , moved to [place] 2007 (has a daughter in town) Live by self motivational speaker, works PT; writting a book Does Patient Exercise:  yes  Review of Systems CV:  Denies chest pain or discomfort and swelling of feet. Resp:  Denies cough and wheezing. GI:  Denies bloody stools, diarrhea, and  nausea. GU:  no vag d/c or bleed  no SBE .  Physical Exam  General:  alert, well-developed, and well-nourished.   Neck:  no masses, no thyromegaly, and normal carotid upstroke.   Breasts:  No mass, nodules, thickening, tenderness, bulging, retraction, inflamation, nipple discharge or skin changes noted.  no axillary lymphadenopathy  Lungs:  normal respiratory effort, no intercostal retractions, no accessory muscle use, and normal breath sounds.   Heart:  normal rate, regular rhythm, and no murmur.   Abdomen:  soft, non-tender, no distention, and no masses.   Genitalia:  normal introitus, no external lesions, and no vaginal discharge.  there was some vaginal atrophy but no lesions. Cervix was a small without lesions. 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     Impression & Recommendations:  Problem # 1:  HEALTH SCREENING (ICD-V70.0)  CHART REVIEWED  Td 2-08 shingles shot 2009 pneumonia shot 1-08 flu shot today  MMG (-) 10-10 , recommend to schedule one  PAP 2-08 (-), PAP sent today she never had an abnormal Pap smear, we discussed that with the patient and decide not to do any further Paps unless something changes.  bone density test  06-2010 negative s/p Cscope Dr Randa Evens 2006, encouraged to go back to Dr. Ladona Ridgel for another colonoscopy  Diet and exercise discussed  Orders: Radiology Referral (Radiology) Welcome to [facility], Physical [phone])  Problem # 2:  BLADDER DISORDER (ICD-596.9)  saw urology several times in the past, last visit few days ago she has and abnormal urinary stream (" sprays all over")  a  bladder scan was done and it was ok. in the past she used a hormonal cream w/o much help and it was expensive.  She was discharged from urology and is to return her p.r.n. no h/o recurrent UTIs    Plan: Observation, discontinue hormonal cream because it was expensive and did not helped  much  Problem # 3:  SKIN LESION (ICD-709.9)  refer to dermatology in reference of the 2 skin lesions. Bx the leg lesion??  Orders: Dermatology Referral (Derma)  Problem # 4:  CARDIOMYOPATHY (ICD-425.4)  seems stable, labs  Orders: Venipuncture ([address]) TLB-Lipid Panel (80061-LIPID) TLB-ALT (SGPT) (  84460-ALT) TLB-AST (SGOT) (84450-SGOT) Specimen Handling ([address])  Problem # 5:  HYPERTENSION (ICD-401.9) at goal  Her updated medication list for this problem includes:    Lisinopril-hydrochlorothiazide 20-12.5 Mg Tabs (Lisinopril-hydrochlorothiazide) .Marland Kitchen... Take 2 tablet by mouth once a day    Losartan Potassium 100 Mg Tabs (Losartan potassium) .Marland Kitchen... 1 tab by mouth once daily    Coreg 12.5 Mg Tabs (Carvedilol) .Marland Kitchen... Take one tablet by mouth twice a day    BP today: 126/88 Prior BP: 124/62 (02/09/2010)  Labs Reviewed: K+: 4.4 (02/09/2010) Creat: : 1.3 (02/09/2010)   Chol: 187 (08/12/2009)   HDL: 57.90 (08/12/2009)   LDL: 117 (08/12/2009)   TG: 59.0 (08/12/2009)  Orders: TLB-BMP (Basic Metabolic Panel-BMET) (80048-METABOL) TLB-Hemoglobin (Hgb) (85018-HGB) Specimen Handling ([address])  Problem # 6:  OSTEOPENIA (ICD-733.90) bone density it is actually negative 8-11 Her updated medication list for this problem includes:    Calcium Carbonate 600 Mg Tabs (Calcium carbonate) .Marland Kitchen... 2 by mouth qd  Orders: T-Vitamin D (25-Hydroxy) ([address]) Specimen Handling ([address])  Problem # 7:  DEPRESSION (ICD-311) well-controlled Her updated medication list for this problem includes:    Klonopin 0.5 Mg Tabs (Clonazepam) .Marland Kitchen... Take 1/2 tablet by mouth at bedtime    Venlafaxine Hcl 75 Mg Xr24h-cap  (Venlafaxine hcl) .Marland Kitchen... 1 by mouth once daily    Mirtazapine 30 Mg Tabs (Mirtazapine) .Marland Kitchen... 2by mouth at bedtime [phone]  Complete Medication List: 1)  Lisinopril-hydrochlorothiazide 20-12.5 Mg Tabs (Lisinopril-hydrochlorothiazide) .... Take 2 tablet by mouth once a day 2)  Losartan Potassium 100 Mg Tabs (Losartan potassium) .Marland Kitchen.. 1 tab by mouth once daily 3)  Coreg 12.5 Mg Tabs (Carvedilol) .... Take one tablet by mouth twice a day 4)  Klonopin 0.5 Mg Tabs (Clonazepam) .... Take 1/2 tablet by mouth at bedtime 5)  Venlafaxine Hcl 75 Mg Xr24h-cap (Venlafaxine hcl) .Marland Kitchen.. 1 by mouth once daily 6)  Mirtazapine 30 Mg Tabs (Mirtazapine) .... 2by mouth at bedtime [phone] 7)  Aspirin 325 Mg Tabs (Aspirin) .Marland Kitchen.. 1 tab by mouth once daily 8)  Multivitamins Tabs (Multiple vitamin) .Marland Kitchen.. 1 tab by mouth once daily 9)  Calcium Carbonate 600 Mg Tabs (Calcium carbonate) .... 2 by mouth qd 10)  Metamucil 30.9 % Powd (Psyllium) .... As needed 11)  Echinacea (during Flu Season)  .... Seasonal as needed 12)  Vitamin D  .... 1 tab by mouth once daily  Other Orders: Flu Vaccine 35yrs + MEDICARE PATIENTS (X5284) Administration Flu vaccine - MCR (X3244)  Patient Instructions: 1)  Please schedule a follow-up appointment in 6 months .  Prescriptions: VENLAFAXINE HCL 75 MG XR24H-CAP (VENLAFAXINE HCL) 1 by mouth once daily  #30 x 3   Entered by:   Army Fossa CMA   Authorized by:   Nolon Rod. Paz MD   Signed by:   Nolon Rod. Paz MD on 08/03/2010   Method used:   Electronically to        [facility] [address] (retail)       [address]       [place], [place]  [address]       Ph: [phone]       Fax: [phone]   RxID:   [id number] COREG 12.5 MG TABS (CARVEDILOL) take one tablet by mouth twice a day  #60 x 5   Entered by:   Army Fossa CMA   Authorized by:   Nolon Rod. Paz MD   Signed by:   Nolon Rod. Paz MD on 08/03/2010   Method  used:   Electronically to        [facility] [address] (retail)       [address]       [place], [place]  [address]       Ph: [phone]       Fax: [phone]   RxID:   [id number] LOSARTAN POTASSIUM 100 MG TABS (LOSARTAN POTASSIUM) 1 tab by mouth once daily  #30 Tablet x 5   Entered by:   Army Fossa CMA   Authorized by:   Nolon Rod. Paz MD   Signed by:   Nolon Rod. Paz MD on 08/03/2010   Method used:   Electronically to        [facility] [address] (retail)       [address]       [place], [place]  [address]       Ph: [phone]       Fax: [phone]   RxID:   [id number]    Risk Factors:  Exercise:  yes    Times per week:  3 Flu Vaccine Consent Questions     Do you have a history of severe allergic reactions to this vaccine? no    Any prior history of allergic reactions to egg and/or gelatin? no    Do you have a sensitivity to the preservative Thimersol? no    Do you have a past history of Guillan-Barre Syndrome? no    Do you currently have an acute febrile illness? no    Have you ever had a severe reaction to latex? no    Vaccine information given and explained to patient? yes    Are you currently pregnant? no    Lot Number:[id number]   Exp Date:05/13/2011   Site Given  Left Deltoid IM yes    Times per week:  3    .lbmedflu

## 2010-12-13 NOTE — Miscellaneous (Signed)
Summary: BONE DENSITY  Clinical Lists Changes  Orders: Added new Test order of T-Bone Densitometry (77080) - Signed Added new Test order of T-Lumbar Vertebral Assessment (77082) - Signed 

## 2010-12-13 NOTE — Assessment & Plan Note (Signed)
Summary: per check out/sf      Allergies Added:   CC:  check up.  History of Present Illness: [name] [name] is seen today for F/U of DCM presumed non-ischemic with normal cath in 2007.  Her EF has returned to nomral by most recent echo. She has had asymptomatic PVC's in the past with no history of syncope.  She was concerned about her left ankle which has been broken before.  She has no venous or arterial disease in this leg and I think her issues are more orthopedic or related to restless legs.  She will F/U with Dr Clydell Hakim for her orthopedic issues.  She has not had any dyspnea, palpitaotns SSCP or syncope.  She is active and involved with her church.  Current Problems (verified): 1)  Premature Ventricular Contractions  (ICD-427.69) 2)  Cardiomyopathy  (ICD-425.4) 3)  Hypertension  (ICD-401.9) 4)  Restless Leg Syndrome  (ICD-333.94) 5)  Elbow Pain, Left  (ICD-719.42) 6)  Bladder Disorder  (ICD-596.9) 7)  Anemia-nos  (ICD-285.9) 8)  Localized Superficial Swelling Mass or Lump  (ICD-782.2) 9)  Osteopenia  (ICD-733.90) 10)  Depression  (ICD-311) 11)  Colonic Polyps, Hx of  (ICD-V12.72) 12)  Health Screening  (ICD-V70.0) 13)  Anxiety  (ICD-300.00)  Current Medications (verified): 1)  Lisinopril-Hydrochlorothiazide 20-12.5 Mg Tabs (Lisinopril-Hydrochlorothiazide) .... Take 2 Tablet By Mouth Once A Day 2)  Losartan Potassium 100 Mg Tabs (Losartan Potassium) .Marland Kitchen.. 1 Tab By Mouth Once Daily 3)  Klonopin 0.5 Mg Tabs (Clonazepam) .... Take 1/2 Tablet By Mouth At Bedtime 4)  Venlafaxine Hcl 75 Mg Tabs (Venlafaxine Hcl) .Marland Kitchen.. 1 Tab By Mouth Once Daily 5)  Mirtazapine 30 Mg Tabs (Mirtazapine) .... 2by Mouth At Bedtime [phone] 6)  Aspirin 325 Mg Tabs (Aspirin) .Marland Kitchen.. 1 Tab By Mouth Once Daily 7)  Multivitamins  Tabs (Multiple Vitamin) .Marland Kitchen.. 1 Tab By Mouth Once Daily 8)  Calcium Carbonate 600 Mg Tabs (Calcium Carbonate) .... 2 By Mouth Qd 9)  Metamucil 30.9 % Powd (Psyllium) .... As Needed 10)   Echinacea (During Flu Season) .... Seasonal As Needed 11)  Vitamin D .... 1 Tab By Mouth Once Daily 12)  Coreg 12.5 Mg Tabs (Carvedilol) .... Take One Tablet By Mouth Twice A Day 13)  Voltaren 1 % Gel (Diclofenac Sodium) .... As Directed  Allergies (verified): 1)  ! Codeine 2)  ! Hydrocodone 3)  ! Adhesive Tape 4)  ! * Shellfish  Past History:  Past Medical History: Last updated: 08/12/2009 CARDIOMYOPATHY nonischemic cardiomyopathy.  She had a normal cath in December 2007, followup MUGA scan showed an EF improving to 52% from 40%.  h/o ASD per Bubble study 2004     Colonic polyps, hx of  Depression  Hypertension  Osteopenia Anemia   Past Surgical History: Last updated: 08/12/2009 back surgery --2000--due to a fall Breast Bx (-) tonsillectomy   Family History: Last updated: 08/12/2009 Hypertension... + Heart disease--MI F and 2 Brothers DM--M  and mother side  colon ca--no breast ca--no  Social History: Last updated: 03/09/2008 Never Smoked Alcohol use-no Widow , moved to [place] 2007 (has a daughter in town) Live by self motivational speaker  Review of Systems       Denies fever, malais, weight loss, blurry vision, decreased visual acuity, cough, sputum, SOB, hemoptysis, pleuritic pain, palpitaitons, heartburn, abdominal pain, melena, lower extremity edema, claudication, or rash.   Vital Signs:  Patient profile:   77 year old female Height:      64 inches Weight:  150 pounds BMI:     25.84 Pulse rate:   78 / minute Resp:     12 per minute BP sitting:   126 / 75  (left arm)  Vitals Entered By: Kem Parkinson (December 01, 2009 10:47 AM)  Physical Exam  General:  Affect appropriate Healthy:  appears stated age HEENT: normal Neck supple with no adenopathy JVP normal no bruits no thyromegaly Lungs clear with no wheezing and good diaphragmatic motion Heart:  S1/S2 no murmur,rub, gallop or click PMI normal Abdomen: benighn, BS positve, no  tenderness, no AAA no bruit.  No HSM or HJR Distal pulses intact with no bruits No edema Neuro non-focal Skin warm and dry Hig arches in feet   Impression & Recommendations:  Problem # 1:  PREMATURE VENTRICULAR CONTRACTIONS (ICD-427.69) Benign continue BB Her updated medication list for this problem includes:    Lisinopril-hydrochlorothiazide 20-12.5 Mg Tabs (Lisinopril-hydrochlorothiazide) .Marland Kitchen... Take 2 tablet by mouth once a day    Aspirin 325 Mg Tabs (Aspirin) .Marland Kitchen... 1 tab by mouth once daily    Coreg 12.5 Mg Tabs (Carvedilol) .Marland Kitchen... Take one tablet by mouth twice a day  Problem # 2:  CARDIOMYOPATHY (ICD-425.4) Improved, functinal class one.  No need to assess LV function at this time Her updated medication list for this problem includes:    Lisinopril-hydrochlorothiazide 20-12.5 Mg Tabs (Lisinopril-hydrochlorothiazide) .Marland Kitchen... Take 2 tablet by mouth once a day    Losartan Potassium 100 Mg Tabs (Losartan potassium) .Marland Kitchen... 1 tab by mouth once daily    Aspirin 325 Mg Tabs (Aspirin) .Marland Kitchen... 1 tab by mouth once daily    Coreg 12.5 Mg Tabs (Carvedilol) .Marland Kitchen... Take one tablet by mouth twice a day  Problem # 3:  HYPERTENSION (ICD-401.9) Well cdntrolled including reviewe of home readings Her updated medication list for this problem includes:    Lisinopril-hydrochlorothiazide 20-12.5 Mg Tabs (Lisinopril-hydrochlorothiazide) .Marland Kitchen... Take 2 tablet by mouth once a day    Losartan Potassium 100 Mg Tabs (Losartan potassium) .Marland Kitchen... 1 tab by mouth once daily    Aspirin 325 Mg Tabs (Aspirin) .Marland Kitchen... 1 tab by mouth once daily    Coreg 12.5 Mg Tabs (Carvedilol) .Marland Kitchen... Take one tablet by mouth twice a day  Patient Instructions: 1)  Your physician recommends that you schedule a follow-up appointment in:  1 year with Dr Eden Emms 2)  Your physician recommends that you continue on your current medications as directed. Please refer to the Current Medication list given to you today.

## 2010-12-13 NOTE — Procedures (Signed)
Summary: Colonoscopy--no further cscopes, iFOBs in 3-5 years   Colonoscopy/[facility]   Imported By: Lanelle Bal 09/13/2010 10:37:07  _____________________________________________________________________  External Attachment:    Type:   Image     Comment:   External Document

## 2010-12-15 NOTE — Assessment & Plan Note (Signed)
Summary: 1 year rov PVC,Edema  pfh,rn  Medications Added PREDNISONE 10 MG TABS (PREDNISONE) as directed * HYDROCORTISONE as directed      Allergies Added:   CC:  check up.  History of Present Illness: [name] [name] is seen today for F/U of DCM presumed non-ischemic with normal cath in 2007.  Her EF has returned to nomral by most recent echo. She has had asymptomatic PVC's in the past with no history of syncope.  She was concerned about her left ankle which has been broken before.  She has no venous or arterial disease in this leg and I think her issues are more orthopedic or related to restless legs.  She will F/U with Dr Clydell Hakim for her orthopedic issues.  She has not had any dyspnea, palpitaotns SSCP or syncope.  She is active and involved with her church. Needs a F/U echo to assess EF  Currently on prednisone for rash on neck she got using new perfume.  BP running a little higher on prednisone  Current Problems (verified): 1)  Rash-nonvesicular  (ICD-782.1) 2)  Skin Lesion  (ICD-709.9) 3)  Osteoarthritis  (ICD-715.90) 4)  Premature Ventricular Contractions  (ICD-427.69) 5)  Cardiomyopathy  (ICD-425.4) 6)  Hypertension  (ICD-401.9) 7)  Restless Leg Syndrome  (ICD-333.94) 8)  Bladder Disorder  (ICD-596.9) 9)  Osteopenia  (ICD-733.90) 10)  Depression  (ICD-311) 11)  Colonic Polyps, Hx of  (ICD-V12.72) 12)  Health Screening  (ICD-V70.0) 13)  Anxiety  (ICD-300.00)  Current Medications (verified): 1)  Lisinopril-Hydrochlorothiazide 20-12.5 Mg Tabs (Lisinopril-Hydrochlorothiazide) .... Take 2 Tablet By Mouth Once A Day 2)  Losartan Potassium 100 Mg Tabs (Losartan Potassium) .Marland Kitchen.. 1 Tab By Mouth Once Daily 3)  Coreg 12.5 Mg Tabs (Carvedilol) .... Take One Tablet By Mouth Twice A Day 4)  Klonopin 0.5 Mg Tabs (Clonazepam) .... Take 1/2 Tablet By Mouth At Bedtime 5)  Venlafaxine Hcl 75 Mg Xr24h-Cap (Venlafaxine Hcl) .Marland Kitchen.. 1 By Mouth Once Daily 6)  Mirtazapine 30 Mg Tabs (Mirtazapine) .... 2by  Mouth At Bedtime [phone] 7)  Aspirin 325 Mg Tabs (Aspirin) .Marland Kitchen.. 1 Tab By Mouth Once Daily 8)  Multivitamins  Tabs (Multiple Vitamin) .Marland Kitchen.. 1 Tab By Mouth Once Daily 9)  Calcium Carbonate 600 Mg Tabs (Calcium Carbonate) .... 2 By Mouth Qd 10)  Metamucil 30.9 % Powd (Psyllium) .... As Needed 11)  Echinacea (During Flu Season) .... Seasonal As Needed 12)  Vitamin D .... 1 Tab By Mouth Once Daily 13)  Prednisone 10 Mg Tabs (Prednisone) .... As Directed 14)  Hydrocortisone .... As Directed  Allergies (verified): 1)  ! Codeine 2)  ! Hydrocodone 3)  ! Adhesive Tape 4)  ! * Shellfish  Past History:  Past Medical History: Last updated: 08/03/2010 CARDIOMYOPATHY nonischemic cardiomyopathy.  She had a normal cath in December 2007, followup MUGA scan showed an EF improving to 52% from 40%.  h/o ASD per Bubble study 2004     Colonic polyps, hx of  Depression  Hypertension  Osteopenia Anemia  Osteoarthritis  Past Surgical History: Last updated: 08/12/2009 back surgery --2000--due to a fall Breast Bx (-) tonsillectomy   Family History: Last updated: 08/12/2009 Hypertension... + Heart disease--MI F and 2 Brothers DM--M  and mother side  colon ca--no breast ca--no  Social History: Last updated: 08/03/2010 Never Smoked Alcohol use-no Widow , moved to [place] 2007 (has a daughter in town) Live by self motivational speaker, works PT; writting a book  Review of Systems       Denies  fever, malais, weight loss, blurry vision, decreased visual acuity, cough, sputum, SOB, hemoptysis, pleuritic pain, palpitaitons, heartburn, abdominal pain, melena, lower extremity edema, claudication, or rash.   Vital Signs:  Patient profile:   77 year old female Height:      64 inches Weight:      146 pounds BMI:     25.15 Pulse rate:   71 / minute Resp:     12 per minute BP sitting:   154 / 79  (left arm)  Vitals Entered By: Kem Parkinson (December 07, 2010 9:09 AM)  Physical  Exam  General:  Affect appropriate Healthy:  appears stated age HEENT: normal Neck supple with no adenopathy JVP normal no bruits no thyromegaly Lungs clear with no wheezing and good diaphragmatic motion Heart:  S1/S2 no murmur,rub, gallop or click PMI normal Abdomen: benighn, BS positve, no tenderness, no AAA no bruit.  No HSM or HJR Distal pulses intact with no bruits No edema Neuro non-focal Skin warm and dry Rash on neck from perfume   Impression & Recommendations:  Problem # 1:  RASH-NONVESICULAR (ICD-782.1) F/U Dr Drue Novel improving  Continue sterioid cream and prednisone taper  Problem # 2:  CARDIOMYOPATHY (ICD-425.4) F/U echo to assess EF.  Functional class one Her updated medication list for this problem includes:    Lisinopril-hydrochlorothiazide 20-12.5 Mg Tabs (Lisinopril-hydrochlorothiazide) .Marland Kitchen... Take 2 tablet by mouth once a day    Losartan Potassium 100 Mg Tabs (Losartan potassium) .Marland Kitchen... 1 tab by mouth once daily    Coreg 12.5 Mg Tabs (Carvedilol) .Marland Kitchen... Take one tablet by mouth twice a day    Aspirin 325 Mg Tabs (Aspirin) .Marland Kitchen... 1 tab by mouth once daily  Orders: Echocardiogram (Echo) EKG w/ Interpretation (93000)  Problem # 3:  HYPERTENSION (ICD-401.9) F/U after being of prednisone.  Continue current meds Her updated medication list for this problem includes:    Lisinopril-hydrochlorothiazide 20-12.5 Mg Tabs (Lisinopril-hydrochlorothiazide) .Marland Kitchen... Take 2 tablet by mouth once a day    Losartan Potassium 100 Mg Tabs (Losartan potassium) .Marland Kitchen... 1 tab by mouth once daily    Coreg 12.5 Mg Tabs (Carvedilol) .Marland Kitchen... Take one tablet by mouth twice a day    Aspirin 325 Mg Tabs (Aspirin) .Marland Kitchen... 1 tab by mouth once daily  Orders: EKG w/ Interpretation (93000)  Patient Instructions: 1)  Your physician recommends that you schedule a follow-up appointment in: YEAR WITH DR Eden Emms 2)  Your physician recommends that you continue on your current medications as directed. Please  refer to the Current Medication list given to you today. 3)  Your physician has requested that you have an echocardiogram.  Echocardiography is a painless test that uses sound waves to create images of your heart. It provides your doctor with information about the size and shape of your heart and how well your heart's chambers and valves are working.  This procedure takes approximately one hour. There are no restrictions for this procedure.

## 2010-12-15 NOTE — Assessment & Plan Note (Signed)
Summary: rash on neck//lch   Vital Signs:  Patient profile:   77 year old female Height:      64 inches Weight:      144 pounds BMI:     24.81 Temp:     98.9 degrees F oral Pulse rate:   78 / minute Pulse rhythm:   regular BP sitting:   148 / 84  (left arm) Cuff size:   large  Vitals Entered By: Army Fossa CMA (December 05, 2010 1:37 PM) CC: Pt here for a rash on neck Comments Started Saturday Used Triple ATB Ointment Itches BP Before rash- 129/60, 127/69, 127/65 BP after rash- 157/84, 157/86, 149/78 [facility]   History of Present Illness: symptoms started a week ago with itching around the neck 3 days ago symptoms got worse after she used antibiotic ointment over-the-counter. She has also noted that her BP has increased. BP Before rash- 129/60, 127/69, 127/65  ROS  No fevers No other areas affected except the neck On looking back, the patient noticed that she has been using a new perfume and a new necklace. She also has eaten pineapple,  tuna which she does not eat frequently.  Current Medications (verified): 1)  Lisinopril-Hydrochlorothiazide 20-12.5 Mg Tabs (Lisinopril-Hydrochlorothiazide) .... Take 2 Tablet By Mouth Once A Day 2)  Losartan Potassium 100 Mg Tabs (Losartan Potassium) .Marland Kitchen.. 1 Tab By Mouth Once Daily 3)  Coreg 12.5 Mg Tabs (Carvedilol) .... Take One Tablet By Mouth Twice A Day 4)  Klonopin 0.5 Mg Tabs (Clonazepam) .... Take 1/2 Tablet By Mouth At Bedtime 5)  Venlafaxine Hcl 75 Mg Xr24h-Cap (Venlafaxine Hcl) .Marland Kitchen.. 1 By Mouth Once Daily 6)  Mirtazapine 30 Mg Tabs (Mirtazapine) .... 2by Mouth At Bedtime [phone] 7)  Aspirin 325 Mg Tabs (Aspirin) .Marland Kitchen.. 1 Tab By Mouth Once Daily 8)  Multivitamins  Tabs (Multiple Vitamin) .Marland Kitchen.. 1 Tab By Mouth Once Daily 9)  Calcium Carbonate 600 Mg Tabs (Calcium Carbonate) .... 2 By Mouth Qd 10)  Metamucil 30.9 % Powd (Psyllium) .... As Needed 11)  Echinacea (During Flu Season) .... Seasonal As Needed 12)  Vitamin D  .... 1 Tab By Mouth Once Daily  Allergies (verified): 1)  ! Codeine 2)  ! Hydrocodone 3)  ! Adhesive Tape 4)  ! * Shellfish   Past History:  Past Medical History: Reviewed history from 08/03/2010 and no changes required. CARDIOMYOPATHY nonischemic cardiomyopathy.  She had a normal cath in December 2007, followup MUGA scan showed an EF improving to 52% from 40%.  h/o ASD per Bubble study 2004     Colonic polyps, hx of  Depression  Hypertension  Osteopenia Anemia  Osteoarthritis  Past Surgical History: Reviewed history from 08/12/2009 and no changes required. back surgery --2000--due to a fall Breast Bx (-) tonsillectomy   Social History: Reviewed history from 08/03/2010 and no changes required. Never Smoked Alcohol use-no Widow , moved to [place] 2007 (has a daughter in town) Live by self motivational speaker, works PT; writting a book  Physical Exam  General:  alert, well-developed, and well-nourished.   Skin:  skin in the neck has a rash that is papular, red, few blisters, some scales, rashes mostly linear and confluent. Much worse anteriorly. hands and wrists unaffected   Impression & Recommendations:  Problem # 1:  RASH-NONVESICULAR (ICD-782.1) symptoms consistent with contact dermatitis, likely triggered by her new perfume or necklace. Symptoms worse after using an over-the-counter antibiotic ointment. Recommend to discontinue the perfume, neck place and antibiotic ointment. Start  hydrocortisone and prednisone by mouth. See instructions Her updated medication list for this problem includes:    Hydrocortisone 2.5 % Crea (Hydrocortisone) .Marland Kitchen... Apply three times a day x 1 week  Complete Medication List: 1)  Lisinopril-hydrochlorothiazide 20-12.5 Mg Tabs (Lisinopril-hydrochlorothiazide) .... Take 2 tablet by mouth once a day 2)  Losartan Potassium 100 Mg Tabs (Losartan potassium) .Marland Kitchen.. 1 tab by mouth once daily 3)  Coreg 12.5 Mg Tabs (Carvedilol) .... Take one  tablet by mouth twice a day 4)  Klonopin 0.5 Mg Tabs (Clonazepam) .... Take 1/2 tablet by mouth at bedtime 5)  Venlafaxine Hcl 75 Mg Xr24h-cap (Venlafaxine hcl) .Marland Kitchen.. 1 by mouth once daily 6)  Mirtazapine 30 Mg Tabs (Mirtazapine) .... 2by mouth at bedtime [phone] 7)  Aspirin 325 Mg Tabs (Aspirin) .Marland Kitchen.. 1 tab by mouth once daily 8)  Multivitamins Tabs (Multiple vitamin) .Marland Kitchen.. 1 tab by mouth once daily 9)  Calcium Carbonate 600 Mg Tabs (Calcium carbonate) .... 2 by mouth qd 10)  Metamucil 30.9 % Powd (Psyllium) .... As needed 11)  Echinacea (during Flu Season)  .... Seasonal as needed 12)  Vitamin D  .... 1 tab by mouth once daily 13)  Hydrocortisone 2.5 % Crea (Hydrocortisone) .... Apply three times a day x 1 week 14)  Prednisone 10 Mg Tabs (Prednisone) .... 4 by mouth once daily x 2 days, 3 by mouth once daily x 2 days, 2 by mouth once daily x 2 days, 1 by mouth once daily x2 days  Patient Instructions: 1)  avoid the new perfume and neck lace 2)  cream and pills as prescribed 3)  call if no better in 5 days  Prescriptions: PREDNISONE 10 MG TABS (PREDNISONE) 4 by mouth once daily x 2 days, 3 by mouth once daily x 2 days, 2 by mouth once daily x 2 days, 1 by mouth once daily x2 days  #20 x 0   Entered and Authorized by:   Nolon Rod. Cashus Halterman MD   Signed by:   Nolon Rod. Nell Schrack MD on 12/05/2010   Method used:   Print then Give to Patient   RxID:   [id number] HYDROCORTISONE 2.5 % CREA (HYDROCORTISONE) apply three times a day x 1 week  #1 x 0   Entered and Authorized by:   Nolon Rod. Tyiana Hill MD   Signed by:   Nolon Rod. Drequan Ironside MD on 12/05/2010   Method used:   Print then Give to Patient   RxID:   [id number]    Orders Added: 1)  Est. Patient Level III [84696]

## 2010-12-15 NOTE — Progress Notes (Signed)
Summary: Clonazepam refill  Phone Note Refill Request Message from:  Fax from Pharmacy on October 26, 2010 10:34 AM  Refills Requested: Medication #1:  KLONOPIN 0.5 MG TABS Take 1/2 tablet by mouth at bedtime   Last Refilled: 08/24/2010 [facility], [place], [place], [place]   phone-[phone], fax  [phone]   qty - 30  Next Appointment Scheduled: Mon 01/30/2011  Paz Initial call taken by: Jerolyn Shin,  October 26, 2010 10:39 AM  Follow-up for Phone Call        ok 30 and 6  RF Jose E. Paz MD  October 26, 2010 10:44 AM     Prescriptions: KLONOPIN 0.5 MG TABS (CLONAZEPAM) Take 1/2 tablet by mouth at bedtime  #30 x 6   Entered by:   Army Fossa CMA   Authorized by:   Nolon Rod. Paz MD   Signed by:   Army Fossa CMA on 10/26/2010   Method used:   Printed then faxed to ...       [facility] [address] (retail)       [address]       [place], [place]  [address]       Ph: [phone]       Fax: [phone]   RxID:   [id number]

## 2010-12-16 NOTE — Consult Note (Signed)
Summary: Duard Larsen MD Dermatology  Duard Larsen MD Dermatology   Imported By: Lanelle Bal 08/31/2010 15:34:46  _____________________________________________________________________  External Attachment:    Type:   Image     Comment:   External Document

## 2010-12-26 ENCOUNTER — Encounter: Payer: Self-pay | Admitting: Internal Medicine

## 2011-01-24 NOTE — Letter (Signed)
Summary: Health Assessment/[employer]  Health Assessment/[employer]   Imported By: Lanelle Bal 01/10/2011 13:49:38  _____________________________________________________________________  External Attachment:    Type:   Image     Comment:   External Document

## 2011-01-30 ENCOUNTER — Other Ambulatory Visit (INDEPENDENT_AMBULATORY_CARE_PROVIDER_SITE_OTHER): Payer: Medicare Other | Admitting: Internal Medicine

## 2011-01-30 ENCOUNTER — Encounter: Payer: Self-pay | Admitting: Internal Medicine

## 2011-01-30 ENCOUNTER — Ambulatory Visit (INDEPENDENT_AMBULATORY_CARE_PROVIDER_SITE_OTHER): Payer: Medicare Other | Admitting: Internal Medicine

## 2011-01-30 DIAGNOSIS — I10 Essential (primary) hypertension: Secondary | ICD-10-CM

## 2011-01-31 LAB — BASIC METABOLIC PANEL
CO2: 30 mEq/L (ref 19–32)
Calcium: 9.6 mg/dL (ref 8.4–10.5)
Creatinine, Ser: 1.1 mg/dL (ref 0.4–1.2)
GFR: 63.2 mL/min (ref >60.00)
Sodium: 136 mEq/L (ref 135–145)

## 2011-02-09 NOTE — Assessment & Plan Note (Signed)
Summary: 6 MONTH ROV/SPH   Vital Signs:  Patient profile:   77 year old female Weight:      148.38 pounds Pulse rate:   76 / minute Pulse rhythm:   regular BP sitting:   128 / 80  (left arm) Cuff size:   large  Vitals Entered By: Army Fossa CMA (January 30, 2011 1:06 PM) CC: 6 month f/u- not fasting  Comments [place]   History of Present Illness: ROV  CARDIOMYOPATHY, saw cards, good report   was seen here w/ a rash, doing better    Colonic polyps, hx of-- recent Cscope neg, no further Cscopes per GI   Depression-- on meds, symptoms well controlled    Hypertension-  ambulatory BPs 120/60s       Current Medications (verified): 1)  Lisinopril-Hydrochlorothiazide 20-12.5 Mg Tabs (Lisinopril-Hydrochlorothiazide) .... Take 2 Tablet By Mouth Once A Day 2)  Losartan Potassium 100 Mg Tabs (Losartan Potassium) .Marland Kitchen.. 1 Tab By Mouth Once Daily 3)  Coreg 12.5 Mg Tabs (Carvedilol) .... Take One Tablet By Mouth Twice A Day 4)  Klonopin 0.5 Mg Tabs (Clonazepam) .... Take 1/2 Tablet By Mouth At Bedtime 5)  Venlafaxine Hcl 75 Mg Xr24h-Cap (Venlafaxine Hcl) .Marland Kitchen.. 1 By Mouth Once Daily 6)  Mirtazapine 30 Mg Tabs (Mirtazapine) .... 2by Mouth At Bedtime [phone] 7)  Aspirin 325 Mg Tabs (Aspirin) .Marland Kitchen.. 1 Tab By Mouth Once Daily 8)  Multivitamins  Tabs (Multiple Vitamin) .Marland Kitchen.. 1 Tab By Mouth Once Daily 9)  Calcium Carbonate 600 Mg Tabs (Calcium Carbonate) .... 2 By Mouth Qd 10)  Metamucil 30.9 % Powd (Psyllium) .... As Needed 11)  Echinacea (During Flu Season) .... Seasonal As Needed 12)  Vitamin D .... 1 Tab By Mouth Once Daily  Allergies (verified): 1)  ! Codeine 2)  ! Hydrocodone 3)  ! Adhesive Tape 4)  ! * Shellfish  Past History:  Past Medical History: Reviewed history from 08/03/2010 and no changes required. CARDIOMYOPATHY nonischemic cardiomyopathy.  She had a normal cath in December 2007, followup MUGA scan showed an EF improving to 52% from 40%.  h/o ASD per [place]  study 2004     Colonic polyps, hx of  Depression  Hypertension  Osteopenia Anemia  Osteoarthritis  Past Surgical History: Reviewed history from 08/12/2009 and no changes required. back surgery --2000--due to a fall Breast Bx (-) tonsillectomy   Social History: Reviewed history from 08/03/2010 and no changes required. Never Smoked Alcohol use-no Widow , moved to [place] 2007 (has a daughter in town) Live by self motivational speaker, works PT; writting a book  Review of Systems General:  diet very good, exercises, goes to the "[employer]"". CV:  Denies chest pain or discomfort and swelling of feet. Resp:  Denies cough and shortness of breath. GU:  Denies dysuria and hematuria.  Physical Exam  General:  alert, well-developed, and well-nourished.   Lungs:  normal respiratory effort, no intercostal retractions, no accessory muscle use, and normal breath sounds.   Heart:  normal rate, regular rhythm, and no murmur.   Psych:  Oriented X3, memory intact for recent and remote, normally interactive, good eye contact, not anxious appearing, and not depressed appearing.  Good spirits   Impression & Recommendations:  Problem # 1:  HYPERTENSION (ICD-401.9) at goal  Her updated medication list for this problem includes:    Lisinopril-hydrochlorothiazide 20-12.5 Mg Tabs (Lisinopril-hydrochlorothiazide) .Marland Kitchen... Take 2 tablet by mouth once a day    Losartan Potassium 100 Mg Tabs (Losartan potassium) .Marland KitchenMarland KitchenMarland KitchenMarland Kitchen  1 tab by mouth once daily    Coreg 12.5 Mg Tabs (Carvedilol) .Marland Kitchen... Take one tablet by mouth twice a day  Orders: Venipuncture (16109) TLB-BMP (Basic Metabolic Panel-BMET) (80048-METABOL)  BP today: 128/80 Prior BP: 154/79 (12/07/2010)  Labs Reviewed: K+: 4.1 (08/03/2010) Creat: : 1.2 (08/03/2010)   Chol: 209 (08/03/2010)   HDL: 68.40 (08/03/2010)   LDL: 117 (08/12/2009)   TG: 62.0 (08/03/2010)  Problem # 2:  CARDIOMYOPATHY (ICD-425.4) saw cards, stable   Complete Medication List: 1)   Lisinopril-hydrochlorothiazide 20-12.5 Mg Tabs (Lisinopril-hydrochlorothiazide) .... Take 2 tablet by mouth once a day 2)  Losartan Potassium 100 Mg Tabs (Losartan potassium) .Marland Kitchen.. 1 tab by mouth once daily 3)  Coreg 12.5 Mg Tabs (Carvedilol) .... Take one tablet by mouth twice a day 4)  Klonopin 0.5 Mg Tabs (Clonazepam) .... Take 1/2 tablet by mouth at bedtime 5)  Venlafaxine Hcl 75 Mg Xr24h-cap (Venlafaxine hcl) .Marland Kitchen.. 1 by mouth once daily 6)  Mirtazapine 30 Mg Tabs (Mirtazapine) .... 2by mouth at bedtime [phone] 7)  Aspirin 325 Mg Tabs (Aspirin) .Marland Kitchen.. 1 tab by mouth once daily 8)  Multivitamins Tabs (Multiple vitamin) .Marland Kitchen.. 1 tab by mouth once daily 9)  Calcium Carbonate 600 Mg Tabs (Calcium carbonate) .... 2 by mouth qd 10)  Metamucil 30.9 % Powd (Psyllium) .... As needed 11)  Echinacea (during Flu Season)  .... Seasonal as needed 12)  Vitamin D  .... 1 tab by mouth once daily  Patient Instructions: 1)  Please schedule a follow-up appointment in 6 months . Fasting, physical exam   Orders Added: 1)  Venipuncture [36415] 2)  TLB-BMP (Basic Metabolic Panel-BMET) [80048-METABOL] 3)  Est. Patient Level III [60454]

## 2011-02-14 ENCOUNTER — Ambulatory Visit (INDEPENDENT_AMBULATORY_CARE_PROVIDER_SITE_OTHER): Payer: Medicare Other | Admitting: Internal Medicine

## 2011-02-14 ENCOUNTER — Encounter: Payer: Self-pay | Admitting: Internal Medicine

## 2011-02-14 VITALS — BP 130/78 | HR 72 | Temp 98.4°F | Wt 145.4 lb

## 2011-02-14 DIAGNOSIS — S0083XA Contusion of other part of head, initial encounter: Secondary | ICD-10-CM

## 2011-02-14 DIAGNOSIS — S1093XA Contusion of unspecified part of neck, initial encounter: Secondary | ICD-10-CM

## 2011-02-14 NOTE — Patient Instructions (Addendum)
Until  the bruising has completely resolved, please use warm moist compresses 3-4 times a day. Decrease the aspirin to 81 mg daily until the hematoma has resolved completely as well.  Call immediately should you have significant headaches, visual change, or limb weakness.
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
## 2011-02-17 ENCOUNTER — Other Ambulatory Visit: Payer: Self-pay | Admitting: Internal Medicine

## 2011-03-15 ENCOUNTER — Other Ambulatory Visit: Payer: Self-pay | Admitting: Internal Medicine

## 2011-03-19 ENCOUNTER — Other Ambulatory Visit: Payer: Self-pay | Admitting: Internal Medicine

## 2011-03-28 NOTE — Assessment & Plan Note (Signed)
[facility]                            CARDIOLOGY OFFICE NOTE   NAME:Kolbeck, VANA ARIF                MRN:          [id number]  DATE:06/23/2008                            DOB:          28-Sep-1927    HISTORY OF PRESENT ILLNESS:  Ms. Erck returns today for followup.  She has had previous nonischemic cardiomyopathy.  She had a normal cath  in December 2007, followup MUGA scan showed an EF improving to 52% from  40%.  Her hypertension has been well controlled.  She is not having any  significant chest pain, PND, or orthopnea.   REVIEW OF SYSTEMS:  Remarkable for some left shoulder pain.  I believe  she probably strained the muscle, pulling a hose, otherwise negative.  In particular, her weight is stable.  She is not having PND, orthopnea,  no palpitations, or syncope.   MEDICATIONS:  1. Lisinopril and hydrochlorothiazide 20/12.5.  2. Cozaar 100 a day.  3. Mirtazapine.  4. Effexor 75 a day.  5. Aspirin a day.  6. Centrum.  7. Metamucil.  8. Cardizem 240 a day.   PHYSICAL EXAMINATION:  GENERAL:  Remarkable for healthy-appearing  elderly black female, who looks younger than her stated age.  VITAL SIGNS:  Blood pressure is 120/70, pulse 81 regular, respiratory  rate 14, afebrile.  HEENT:  Unremarkable.  NECK:  Carotids are normal without bruit, no lymphadenopathy, no  thyromegaly, no JVP elevation.  LUNGS:  Clear.  Good diaphragmatic motion.  No wheezing.  S1 and S2 with  normal heart sounds.  PMI normal.  ABDOMEN:  Benign.  Bowel sounds positive.  No AAA, no tenderness, no  bruit, no hepatosplenomegaly,  no hepatojugular reflux.  EXTREMITIES:  Distal pulses are intact.  No edema.  NEURO:  Nonfocal.  SKIN:  Warm and dry.  No weakness.   EKG is normal.   IMPRESSION:  1. Hypertension, currently well controlled.  Continue current      medication, low-salt diet.  2. History of dilated cardiomyopathy with no coronary artery disease.   Ejection fraction improved on last MUGA scan from 40 to 52%.      Consider followup MRI or MUGA in a year.  Continue current ARB and      ACE inhibitor dose.  3. Restless leg syndrome.  Continue mirtazapine 60 mg at bedtime.      Overall, I think [name] is      doing well.  Her cardiomyopathy seems to be improved.  She will      follow with Dr. Drue Novel for her primary care needs, and I will see her      back in a year.     [name]. Eden Emms, MD, [facility]  Electronically Signed    PCN/MedQ  DD: 06/23/2008  DT: 06/24/2008  Job #: [id number]   cc:   Willow Ora, MD

## 2011-03-28 NOTE — Assessment & Plan Note (Signed)
[facility]                            CARDIOLOGY OFFICE NOTE   NAME:Geerts, NICLOE FRONTERA                MRN:          [id number]  DATE:07/04/2007                            DOB:          09-Mar-1927    [place] returns today for followup.  She is her usual animated self.  She has had significant hypertension and nonischemic cardiomyopathy.   She has been very good about exercising and taking her blood pressure  medicine and watching her diet.  She has lost about 6 pounds.  She works  out at the [employer] 3 days a week.  She has been taking her home  blood pressure monitoring and actually was able to show me the memory of  these readings from her machine which she brought with it.  In short,  [name] has been an ideal patient.  Her EF has been as low as 45% in  the past.  She has no significant coronary disease by cath, I believe,  in the last 2 years.  She had a MUGA scan done on August 15 and her  ejection fraction has improved to 52%.   I congratulated her on this and told her it had a lot to do with how she  is taking care of herself.   REVIEW OF SYSTEMS:  Is otherwise negative.  In particular, she is  walking, she has no PND or orthopnea, she is functional Class I.  There  have been no palpitations, syncope or chest pain.  She has been  compliant with her meds.  She is currently taking lisinopril 20/12.5,  Cozaar 10 a day, mirtazapine 60 q.h.s., Effexor 75 a day, an aspirin a  day, Metamucil, Cardizem 240 a day and clonazepam 0.5 1/2 tablet a day.   EXAM:  Remarkable for a blood pressure 120/80, pulse is 80 and regular,  weight is 144, she is afebrile, respiratory rate is 14.  HEENT:  Normal.  Carotids normal without bruit.  There is no  lymphadenopathy, no thyromegaly, no JVP elevation.  LUNGS:  Clear with good diaphragmatic motion, no wheezing.  There is an  S1, S2 with normal heart sounds.  PMI is normal.  ABDOMEN:  Benign, bowel  sounds positive, there is no tenderness, no AAA  or renal bruits, no hepatosplenomegaly or hepatojugular reflux.  Femorals are +3 bilaterally without bruit.  PTs are +3.  There is no  lower extremity edema.  NEURO:  Nonfocal.  SKIN:  Warm and dry.  There is no muscular weakness.   Her baseline EKG shows sinus rhythm with nonspecific ST-T wave  abnormalities in inferolateral leads.   IMPRESSION:  1. Hypertension, currently well controlled.  Continue home blood      pressure readings and low salt diet.  No change in medication.  2. History of nonischemic cardiomyopathy, currently functional Class      I.  Ejection improved by MUGA scanning.  Continue with current      regimen.  She will call me if her weight starts to go up or if she      has any exercise-induced dyspnea.  3. Direct constipation.  Continue Metamucil on a daily basis, high      fiber diet.  4. Anxiety/depression.  The patient has actually been doing quite      well.  She moved here when she became a widow.  She has a daughter      in the area with grandchildren.  She has had a good result from her      Effexor XR 75 a day and will continue this.   Overall, I think [name] is doing well and I will see her back in a  year.  She will followup with Dr. Drue Novel for her primary care needs.     [name]. Eden Emms, MD, [facility]  Electronically Signed    PCN/MedQ  DD: 07/04/2007  DT: 07/05/2007  Job #: [id number]   cc:   Willow Ora, MD

## 2011-03-28 NOTE — Procedures (Signed)
[facility]                             NUCLEAR IMAGING STUDY   NAME:[name] [name], [name] [name]                MRN:          [id number]  DATE:06/28/2007                            DOB:          11/10/1927    RESTING MUGA:  The patient is a 77 year old female who has a history of  nonischemic cardiomyopathy.  This study is performed to quantify left  ventricular function.   The patient's red blood cells were labeled using the UltraTag method  with 32 mCi of technetium-99 labeled pertechnetate.  The images were  reconstructed in the anterior, lateral, and left anterior oblique views.  The left ventricular function was calculated at 52%.  The wall motion  appeared to be normal.   FINAL INTERPRETATION:  Resting MGA with calculated ejection fraction of  52% and normal wall motion.     Madolyn Frieze Jens Som, MD, [facility]  Electronically Signed    BSC/MedQ  DD: 07/01/2007  DT: 07/02/2007  Job #: [id number]   cc:   [name]. Eden Emms, MD, [facility]

## 2011-03-31 NOTE — Assessment & Plan Note (Signed)
[facility]                            CARDIOLOGY OFFICE NOTE   NAME:[name] [name], [name] [name]                MRN:          [id number]  DATE:10/23/2006                            DOB:          February 22, 1927    HISTORY:  Ms. [name] [name] is seen today in followup.  I saw her as a new  consult on September 18, 2006.   The patient has multiple coronary risk factors including hypertension  and palpitations.  She is going to need shell fish premedication.  The  patient had a 2-D echocardiogram which showed global hypokinesis with an  ejection fraction of 40%-45%.  Her abdominal ultrasound was normal.  The  patient had no evidence of mitral valve prolapse and only trivial  regurgitation.   The patient has been having palpitations.  Given her decreased LV  function, this increases the risk that she may be having PVCs or  nonsustained VT.  I had a long discussion with the patient today,  regarding the need for a right and left heart catheterization, given a  presumed diagnosis of cardiomyopathy.  She agrees to proceed.   MEDICATIONS:  1. Lisinopril 20/12.5 mg.  2. Cozaar 100 mg a day.  3. Effexor 75 mg a day.  4. Aspirin a day.  5. Cardizem 240 mg q.d.   ALLERGIES:  CODEINE AND SHELL FISH AND ADHESIVE TAPE.  She will need dye  allergy prophylaxis.   PHYSICAL EXAMINATION:  VITAL SIGNS:  Blood pressure 132/70, pulse 70 and  regular.  HEENT:  Normal.  NECK:  No carotid bruits.  No lymphadenopathy.  LUNGS:  Clear.  HEART:  There is an S1, S2, without murmur, rub, gallop or click.  ABDOMEN:  Benign.  EXTREMITIES:  Lower extremity pulses good.  No edema.   IMPRESSION:  1. Presumed cardiomyopathy with an ejection fraction of 40% by      echocardiogram, with trivial mitral regurgitation.  2. Palpitations, possible premature atrial contractions and premature      ventricular contractions.   PLAN:  The patient will be referred for a right and left heart  catheterization, as long as there is no presumed coronary disease.  She  will have an outpatient event monitor.  Will continue her current  therapy with an ARB and ACE inhibitor.  She will continue her aspirin.  At some point, given her decreased left ventricular function, I would  like to stop the Cardizem and likely start a beta blocker such as Coreg  3.125 mg b.i.d.  We will make these changes after her heart  catheterization.   The risks of catheterization, including stroke, bleeding, dye allergy  despite prophylaxis and need for emergency surgery were discussed.  The  patient is willing to proceed.     [name]. Eden Emms, MD, [facility]  Electronically Signed    PCN/MedQ  DD: 10/23/2006  DT: 10/23/2006  Job #: [id number]

## 2011-03-31 NOTE — Cardiovascular Report (Signed)
[name] [name], [name] [name]         ACCOUNT NO.:  [id number]   MEDICAL RECORD NO.:  [id number]          PATIENT TYPE:  OIB   LOCATION:  1961                         FACILITY:  [facility]   PHYSICIAN:  Peter C. Eden Emms, MD, FACCDATE OF BIRTH:  Oct 03, 1927   DATE OF PROCEDURE:  10/31/2006  DATE OF DISCHARGE:                            CARDIAC CATHETERIZATION   [name] has decreased LV function by echocardiogram with an EF  of 40%.  Heart catheterization was done to assess for ischemic versus  nonischemic cardiomyopathy.  The patient has been having exertional  dyspnea.   Cine catheterization was done from the right femoral artery and vein. A  4-French arterial catheter was used. A 7-French venous catheter was used   The left main coronary artery was normal.   Left anterior descending artery was normal.   First and second diagonal branches were normal.   The circumflex coronary artery was left dominant and normal.   The right coronary artery was small and nondominant and normal.   RAO ventriculography: RAO ventriculography showed mild global  hypokinesis with an EF of 45-50%.  The inferior base was somewhat more  hypokinetic than the rest.   There was no significant mitral insufficiency.   Right heart catheterization was done due to the diagnosis of  cardiomyopathy and shortness of breath.  Mean right atrial pressure was  6. RV pressure was 35/6. PA pressure was 35/12.  Mean pulmonary  capillary wedge pressure was 11.  Aortic pressure was 150/73. LV  pressure was 155/11.   IMPRESSION:  The patient would appear to have a nonischemic  cardiomyopathy.  We will continue her current medical therapy including  lisinopril, hydrochlorothiazide and Cozaar   The patient will follow up with me in the office.  We will quantitate  her LV function sometime in the future with an MRI   Overall, she tolerated the procedure well, and hopefully her LV function  will stay stable on medical  therapy.      [name]. Eden Emms, MD, [facility]  Electronically Signed     PCN/MEDQ  D:  10/31/2006  T:  10/31/2006  Job:  [id number]   cc:   Willow Ora, MD

## 2011-03-31 NOTE — Assessment & Plan Note (Signed)
[facility]ALTHCARE                              CARDIOLOGY OFFICE NOTE   NAME:[name] [name], [name] [name]                MRN:          [id number]  DATE:09/18/2006                            DOB:          October 24, 1927    CHIEF COMPLAINT:  [name] [name] is a delightful 77 year old patient of Dr.  Elita Quick Paz's.  She is referred for a history of hypertension, palpitations,  question mitral valve prolapse.   HISTORY OF PRESENT ILLNESS:  [name] [name] was seen by a cardiologist in  [place] about two years ago.  I do not have any records.  There is no  documented history of coronary artery disease or myocardial infarction.  The  patient has had irregular heart beats over many years.  She has never had  syncope from them.  She has never had documented atrial fibrillation.  Her  palpitations can occur at rest or stress.  They tend to be isolated skips  and not long runs of tachycardia.  She has a longstanding history of  hypertension.  Her medications have not been changed in quite some time.  Overall, she takes her blood pressure at home and her systolics run 130-  140/80-88.   From a cardiac perspective, there is a questionable history of mitral valve  prolapse by previous echocardiogram.  As far as I know, she has not been  actively using SBE prophylaxis, and there is no documented history of  murmurs.   REVIEW OF SYSTEMS:  Remarkable for previous laminectomy and back problem  after a fall.  She seems to have recovered well from this.   ALLERGIES:  CODEINE, SHELL FISH.   SOCIAL HISTORY:  She is widowed for the last two years.  She just moved here  from [place] and wanted to be established with a cardiologist, and was  referred by Dr. Drue Novel.  She does aerobic walking three days a week.  She does  have a shell fish allergy, but apparently is not allergic to contrast dye.  She does not follow any special diet.  She is a retired Engineer, site.   PAST SURGICAL  HISTORY:  Her only previous surgery is a laminectomy.   FAMILY HISTORY:  Remarkable for two brothers who are alive and well.  Her  mother died at age 5, from aspiration.  Her father died of non-cardiac  problems.   MEDICATIONS:  1. Lisinopril 20/12.5 mg.  2. Cardizem 240 mg b.i.d.  3. Cozaar 100 mg daily.  4. Effexor 75 mg daily.  5. One aspirin daily.  6. Vitamins.   PHYSICAL EXAMINATION:  VITAL SIGNS:  Blood pressure 135/75.  SKIN:  Warm and dry.  HEENT:  Normal.  NECK:  There is no lymphadenopathy.  There is no thyromegaly.  LUNGS:  Clear lung fields.  HEART:  There is an S1, S2, with normal heart sounds.  ABDOMEN:  Remarkable for a palpable aorta.  It seems somewhat enlarged but  not tender.  There are no renal bruits.  There is no hepatosplenomegaly.  There is no hepatojugular reflux.  EXTREMITIES:  Distal pulses intact.  No edema.  NEUROLOGIC:  Nonfocal.  Her baseline electrocardiogram shows sinus rhythm with right atrial  enlargement.   I do not have a copy of a chest x-ray.   IMPRESSION:  [name] [name] essentially has hypertensive heart disease.  I  suspect her palpitations are secondary to longstanding hypertension and some  mild fibrosis.  She probably has occasional premature atrial contractions or  premature ventricular contractions.  There is no documented history of  atrial fibrillation.  There is a questionable history of a patent foramen  ovale or mitral valve prolapse.  I do not hear a significant murmur;  however, I think it is reasonable to check a 2-D echocardiogram to assess  for a patent foramen ovale, as she may be at risk for stroke, given her  hypertensive heart disease and age.  It will also allow [place] to see if her  blood pressure is well-controlled, based on cardiac size and hypertrophy.   I would also like to do an abdominal ultrasound, including a renal artery  evaluation, since she has significant hypertension and a palpable aorta,  which  seems mildly enlarged.  Her electrocardiogram does not show left  ventricular dysfunction.   Overall I think she is on a reasonable blood pressure regimen.  I think her  lisinopril/hydrochlorothiazide could be increased to 40/25 mg, with an  attempt to stop her Cozaar if things needed to be simplified.  Her baseline  heart rate is a bit elevated, and beta blockers could also be added in the  future.   Dr. Drue Novel will follow up with her other risk factors, including a hemoglobin  A1c and fasting lipid profile.   I will see her back in about six months, as long as her echocardiogram and  the ultrasound of her abdominal aorta and renals does not show any  significant abnormality.    ______________________________  [name] Eden Emms, MD, [facility]    PCN/MedQ  DD: 09/18/2006  DT: 09/18/2006  Job #: [id number]

## 2011-03-31 NOTE — Op Note (Signed)
[name] [name], [place]         ACCOUNT NO.:  [id number]   MEDICAL RECORD NO.:  [id number]          PATIENT TYPE:  AMB   LOCATION:  ENDO                         FACILITY:  [facility]   PHYSICIAN:  James L. Malon Kindle., M.D.DATE OF BIRTH:  01-07-1927   DATE OF PROCEDURE:  08/14/2005  DATE OF DISCHARGE:                                 OPERATIVE REPORT   PROCEDURE PERFORMED:  Colonoscopy.   ENDOSCOPIST:  Llana Aliment. Edwards, M.D.   MEDICATIONS:  Fentanyl 75 mcg, Versed 8 mg IV.   INSTRUMENT USED:  Olympus pediatric adjustable colonoscope.   INDICATIONS FOR PROCEDURE:  History of previous colon polyps with recent  colonoscopy inconclusive due to inadequate prep.  This was done to evaluate  for further colon polyps.   DESCRIPTION OF PROCEDURE:  The procedure had been explained to the patient  and consent obtained.  With the patient in the left lateral decubitus  position, the pediatric adjustable Olympus scope was inserted and advanced.  Prep excellent.  The patient had a long tortuous colon.  Multiple maneuvers  including abdominal pressure and position changes were required.  Eventually  the cecum was reached.  The ileocecal valve and appendiceal orifice were  seen.  The scope was withdrawn and all mucosa carefully examined on  withdrawal and no polyps seen throughout the entire colon.  No significant  diverticular disease.  The rectum was free of polyps.  The scope was  withdrawn.  The patient tolerated the procedure well.   ASSESSMENT:  History of colon polyps, negative colonoscopy at this time.   PLAN:  Will recommend yearly Hemoccults, repeat colonoscopy in five years,  routine post colonoscopy instructions.           ______________________________  Llana Aliment Malon Kindle., M.D.     Waldron Session  D:  08/14/2005  T:  08/14/2005  Job:  [id number]   cc:   Onalee Hua M.D. [place]  [address]  [place] [address]

## 2011-03-31 NOTE — Assessment & Plan Note (Signed)
[facility]ALTHCARE                            CARDIOLOGY OFFICE NOTE   NAME:[name] [name] [name], [name] [name] HELBIG                MRN:          [id number]  DATE:12/31/2006                            DOB:          01/01/1927    HISTORY OF PRESENT ILLNESS:  [name] [name] [name] [name] [name] returns today for follow up.  She  has nonischemic cardiomyopathy with an EF in the 45% range.  We just  catheterized her and her filling pressures were fine.  She denied  significant coronary artery disease.  She is on good medical therapy.   She is really not having any significant PND or orthopnea.   The patient has been compliant with her medications.  She has not had  any significant presyncope or lower extremity edema.   MEDICATIONS:  1. Lisinopril/Hydrochlorothiazide 10/12.5.  2. Cozaar 100 mg daily.  3. Mirtazapine 60 mg q.h.s.  4. Effexor 75 mg daily.  5. Aspirin daily.  6. Metamucil p.r.n.  7. Cardizem 240 mg daily.   PHYSICAL EXAMINATION:  VITAL SIGNS:  Blood pressure 118/70, pulse 60 and  regular.  HEENT:  Normal.  Carotids without bruit.  There is no JVP elevation.  LUNGS:  Clear.  HEART:  S1, S2.  Normal heart sounds.  ABDOMEN:  Benign.  EXTREMITIES:  Lower extremities intact.  No edema.  Cath site is well-  healed.  NEUROLOGICAL:  Nonfocal.  SKIN:  Warm and dry.   IMPRESSION:  Nonischemic cardiomyopathy.  Good feeling pressures and no  coronary artery disease by catheter.  Cath site is well-healed.  In the  future, we may stop her Cardizem and switch her over to Coreg.  However,  her current resting heart rate is only 60.  She is currently functional  class I.  She will see me back in about six months.  We will follow up  her EF with an MRI.  Hopefully, she will stay in the 40% range.  She is  active, and I think she will mostly likely stay in functional class I.  Overall for her age, she is doing extremely well.  Her blood pressure is  well controlled.  Although, I did not start her on  both ACE inhibitors  and ARB.  We will continue it since she seems to be tolerating it well.  I have reviewed her blood pressure readings from  home on her blood pressure machine, and they were all within a good  range with only a couple having systolics over 140.  I will see her in  six months when she has her cardiac MRI.     [name]. Eden Emms, MD, [facility]  Electronically Signed    PCN/MedQ  DD: 12/31/2006  DT: 12/31/2006  Job #: [id number]

## 2011-03-31 NOTE — Assessment & Plan Note (Signed)
[facility] HEALTHCARE                            CARDIOLOGY OFFICE NOTE   NAME:[name], [name]                MRN:          [id number]  DATE:11/01/2006                            DOB:          [birth year]    [name] [name] returns today for followup.  I saw her yesterday and did  a right and left heart catheterization.  She called this morning to say  that she was having some bleeding from her groin site.  Patient has an  extensive history of nonischemic cardiomyopathy with mildly decreased LV  function.  She had normal coronary arteries and no evidence of pulmonary  hypertension on a catheterization  yesterday.  I inspected her wound  today, and she had some very mild oozing from the skin at the  arteriotomy site.  The venous site was fine.  There were no bruits and  no hematoma.   Otherwise, she is doing fairly well.  There was a question of a drug  allergy to shellfish and adhesive tape, but she had no reactions during  the catheterization.   MEDICATIONS:  1. Lisinopril/hydrochlorothiazide 20/12.5.  2. Cozaar 100 a day.  3. Mirtazapine 60 nightly.  4. Effexor 75 a day.  5. Aspirin a day.  6. Cardizem 240 a day.   Here in the office her blood pressure was 126/73.  Pulse is 82 and  regular.  HEENT:  Was normal.  There was no carotid bruits.  There was no thyromegaly.  LUNGS:  Clear.  There was an S1 and S2 with normal heart sounds.  ABDOMEN:  Benign.  Right groin was as inspected with very minimal oozing from the  arteriotomy site with no hematoma and no bruit.  No active bleeding.  This was dressed with Neosporin and a bandage.  Distal pulses were  intact with no edema.  NEUROLOGIC:  Nonfocal.   IMPRESSION:  Stable nonischemic cardiomyopathy.  Ejection fraction 45%  to 50%.  Continue afterload reduction with angiotensin II receptor  blocker and angiotensin-converting enzyme inhibitor.  Continue an  aspirin a day.   At some point in time  we may stop her Cardizem and switch her over to  Coreg.   She will call us if she has anymore problems with her wound, otherwise I  will see her in 2 to 3 months.     [name] [name]. [name] Emms, MD, [facility]  Electronically Signed    PCN/MedQ  DD: 11/01/2006  DT: 11/01/2006  Job #: [id number]

## 2011-03-31 NOTE — Assessment & Plan Note (Signed)
[facility] HEALTHCARE                          [place] OFFICE NOTE   NAME:[name] [name], [name] [name]                MRN:          [id number]  DATE:06/11/2006                            DOB:          05-27-1927    CHIEF COMPLAINT:  To get established.   HISTORY OF PRESENT ILLNESS:  Mrs. Deandrade is a 77 year old African-  American female, who came to the office for the first time today, to get  established.  She recently left the [place] area two months ago and moved  to [place] to stay closer to her family.  Unfortunately, she lost her  husband, 18 months ago.  Today, she also brought records from Dr. Renata Caprice,  her previous primary care doctor, that she has seen for 20 years.  In  general, she is doing well.  She is slightly concerned about her blood  pressure, which varies from the 120/70 to 139/89, despite the fact that she  is very careful with her diet and exercise routinely three or four times a  week.   PAST MEDICAL HISTORY:  1.  Hypertension.  2.  Depression, which is currently in remission.  She has been seen and her      medications been adjusted by her psychiatrist.  3.  Her heart history includes a history of ASD, diagnosed in 2004 by a      bubble study.  She has been told that she has irregular heartbeat and I      am not sure if this represents transient atrial fibrillation.      Nevertheless, she requests cardiology referral at some point.  4.  Osteopenia.  5.  History of colon polyps.  Her GI doctor is Dr. Randa Evens, here in      [place].  The last colonoscopy was in 2006 and the next is supposed      to be in 2011.  6.  Strong family history of coronary artery disease.  7.  Back surgery in 2000, after she had a fall and back injury.  Currently      asymptomatic.  8.  History of negative breast biopsy in the past.   FAMILY HISTORY:  1.  Father is deceased from heart problems when he was in his fifties.  2.  Mother has a  history of hypertension and osteoarthritis.  3.  One brother died early in his life with MI.  The other one has heart      disease, strokes and CHF.  4.  Positive history of diabetes on the mother's side, particularly an      uncle.  5.  No history of breast or colon cancer.   SOCIAL HISTORY:  Again, she is a widow, who moved from [place] to  [place] two months ago.  She lives by herself.  Denies any tobacco or  alcohol use.   REVIEW OF SYSTEMS:  She denies any back pain, leg pain or paresthesias.  She  gets constipated from time to time, but the use of Metamucil helped  tremendously.   MEDICATION LIST:  1.  Clonazepam 0.5 a half p.o. q.h.s.  2.  Lisinopril/hydrochlorothiazide 20/12.5  two p.o. daily.  3.  Remeron 30 one p.o. q.h.s.  4.  Cozaar 100 one p.o. daily.  5.  Cardizem-LA 240 one p.o. daily.  6.  Effexor-XR 75 one p.o. daily.  7.  Aspirin 325 once daily.  8.  She takes calcium 600 once daily, along with a multivitamin that has      vitamin D in it.  9.  Fiber, multivitamins and Tylenol as needed.   ALLERGIES:  1.  Codeine.  2.  Hydrocodone.  3.  Shellfish.  4.  Adhesive tape.   PHYSICAL EXAMINATION:  GENERAL:  The patient is alert and oriented, in no  apparent distress.  VITAL SIGNS:  Blood pressure today is 140/90.  She weighs 148.2 pounds.  Pulse 80, respirations 16.  NECK:  No thyromegaly.  LUNGS:  Clear to auscultation bilaterally.  CARDIOVASCULAR:  Regular rate and rhythm without murmur.  ABDOMEN:  Not distended, soft, good bowel sounds, no organomegaly.  EXTREMITIES:  No edema on either side.  NEUROLOGICAL EXAM:  Speech, gait and motor are intact.   LABORATORY AND X-RAYS:  I do not have any recent labs.  I did have a chance  to review the records from her previous physician.  The problem list was  very useful.  They have documented good, acceptable blood pressure levels on  the last two office visits to her previous primary care doctor.  They make a   notation of the last creatinine being 1.2 in January 2007.   ASSESSMENT AND PLAN:  1.  Hypertension.  At this point, the blood pressure, per previous records,      is mostly well-controlled.  We will check a BMP and we will not change      any medication.  2.  Depression.  This seems to be stable.  I will refer her to a local      psychiatrist.  3.  History of ASD and irregular heartbeat.  We will get more records from      her previous doctor, including the cardiac workup.  4.  History of osteopenia.  Again, we will try to obtain the last bone      density test.  She is advised to increase the calcium 600 mg to one      twice a day.  5.  History of back surgery, at this point asymptomatic.  I do not think she      needs orthopedic referral at this time.  6.  Office visit in three months.  The patient is signing a release of      information to send to Dr.      Renata Caprice.  We will attempt to get their records from the heart doctor, GI,      x-rays, labs, EKGs, etc.                                   Willow Ora, MD   JP/MedQ  DD:  06/11/2006  DT:  06/11/2006  Job #:  [id number]

## 2011-04-03 ENCOUNTER — Telehealth: Payer: Self-pay | Admitting: Internal Medicine

## 2011-04-03 NOTE — Telephone Encounter (Signed)
Left msg for pt to return call.

## 2011-04-03 NOTE — Telephone Encounter (Signed)
See office visit dated 02/14/2011 ---patient fell on 3/28 and was seen 4/3 by Dr Elvera Maria reduced aspirin from 325 mg to 81 mg until bruising subsided---pt says bruising is gone, face is still sore when she "mashes" on it---should she got back to 325 mg dose of aspirin since bruising is gone?   Or does Dr Drue Novel want to see her for an appointment first???

## 2011-04-04 NOTE — Telephone Encounter (Signed)
Spoke w/ pt aware that after reviewing office note that once bruising was completely gone that she could resume 325mg  of aspirin.

## 2011-05-02 ENCOUNTER — Other Ambulatory Visit: Payer: Self-pay | Admitting: *Deleted

## 2011-05-02 MED ORDER — CLONAZEPAM 0.5 MG PO TABS: ORAL_TABLET | ORAL | Status: DC

## 2011-05-02 NOTE — Telephone Encounter (Signed)
Ok klonopin #30, 6 RF

## 2011-05-15 ENCOUNTER — Other Ambulatory Visit: Payer: Self-pay | Admitting: Internal Medicine

## 2011-06-13 ENCOUNTER — Other Ambulatory Visit: Payer: Self-pay | Admitting: Internal Medicine

## 2011-06-27 ENCOUNTER — Other Ambulatory Visit: Payer: Self-pay | Admitting: Internal Medicine

## 2011-06-27 NOTE — Telephone Encounter (Signed)
Effexor XR 75 mg request [last refill 05/15/11 #30x0]

## 2011-06-28 NOTE — Telephone Encounter (Signed)
Rx done. 

## 2011-06-28 NOTE — Telephone Encounter (Signed)
Ok 30 and 6 RF 

## 2011-08-06 ENCOUNTER — Other Ambulatory Visit: Payer: Self-pay | Admitting: Internal Medicine

## 2011-08-07 NOTE — Telephone Encounter (Signed)
Done

## 2011-08-17 ENCOUNTER — Other Ambulatory Visit: Payer: Self-pay | Admitting: Internal Medicine

## 2011-08-24 LAB — I-STAT 8, (EC8 V) (CONVERTED LAB)
BUN: 38 — ABNORMAL HIGH
Bicarbonate: 25.9 — ABNORMAL HIGH
Hemoglobin: 13.9
Operator id: 285491
Sodium: 138

## 2011-09-04 ENCOUNTER — Encounter: Payer: Self-pay | Admitting: Internal Medicine

## 2011-09-04 ENCOUNTER — Ambulatory Visit (INDEPENDENT_AMBULATORY_CARE_PROVIDER_SITE_OTHER): Payer: Medicare Other | Admitting: Internal Medicine

## 2011-09-04 DIAGNOSIS — I428 Other cardiomyopathies: Secondary | ICD-10-CM

## 2011-09-04 DIAGNOSIS — I10 Essential (primary) hypertension: Secondary | ICD-10-CM

## 2011-09-04 DIAGNOSIS — Z23 Encounter for immunization: Secondary | ICD-10-CM

## 2011-09-04 DIAGNOSIS — F341 Dysthymic disorder: Secondary | ICD-10-CM

## 2011-09-04 DIAGNOSIS — Z0000 Encounter for general adult medical examination without abnormal findings: Secondary | ICD-10-CM

## 2011-09-04 DIAGNOSIS — F329 Major depressive disorder, single episode, unspecified: Secondary | ICD-10-CM

## 2011-09-04 DIAGNOSIS — Z79899 Other long term (current) drug therapy: Secondary | ICD-10-CM

## 2011-09-04 LAB — CBC WITH DIFFERENTIAL/PLATELET
Basophils Relative: 1 % (ref 0.0–3.0)
Eosinophils Relative: 3.7 % (ref 0.0–5.0)
Lymphocytes Relative: 39.1 % (ref 12.0–46.0)
Neutrophils Relative %: 47.5 % (ref 43.0–77.0)
RBC: 3.78 Mil/uL — ABNORMAL LOW (ref 3.87–5.11)
WBC: 4.7 10*3/uL (ref 4.5–10.5)

## 2011-09-04 LAB — BASIC METABOLIC PANEL
Calcium: 10.6 mg/dL — ABNORMAL HIGH (ref 8.4–10.5)
Chloride: 101 mEq/L (ref 96–112)
Creatinine, Ser: 1.3 mg/dL — ABNORMAL HIGH (ref 0.4–1.2)

## 2011-09-04 LAB — LIPID PANEL
Cholesterol: 206 mg/dL — ABNORMAL HIGH (ref 0–200)
HDL: 72.2 mg/dL (ref >39.00)

## 2011-09-04 NOTE — Assessment & Plan Note (Addendum)
CHART REVIEWED  Td 2-08, shingles shot 2009, pneumonia shot 1-08 flu shot today  Has yearly MMGs  PAP 2-08 and 9-11 (-)---> we discussed that with the patient and decide not to do any further Paps unless something changes.  bone density test  06-2010 negative, repeat DEXA next year s/p Cscope Dr Randa Evens 2006, and 10- 2011, no further scopes, iFOB in 5 years? Diet and exercise discussed Continue with calcium and vitamin D

## 2011-09-04 NOTE — Progress Notes (Signed)
Subjective:    Patient ID: [name] [name], female    DOB: [birth year], 77 y.o.   MRN: [id number]  HPI Here for Medicare AWV: 1. Risk factors based on Past M, S, F history: reviewed  2. Physical Activities: goes to the [employer]/week , witting a book 3. Depression/mood: symptoms well controlled w/ effexor  4. Hearing: no problems reported or noted  5. ADL's: totally independent , still drives  6. Fall Risk: had a mechanical fall 3-12, and 4-12---->  counseled 7. Home Safety: does feel safe ar home  8. Height, weight, &visual acuity: see VS,  vision well corrected  w/ glasses  9. Counseling: yes, see a/p  10. Labs ordered based on risk factors: yes  11.       Referral Coordination: yes , see below  12.       Care Plan:see a/p  13.       Cognitive Assessment.. motor skills, memory and cognition seemed appropriate  in addition we discussed the following issues Hypertension, good medication compliance, somehow concerned because she is taking both lisinopril and losartan Cardiomyopathy seems to be doing well, chart reviewed, office visit with cardiology was 9 months ago, had echocardiogram 11-2010 and it was stable. She's also concerned because she had 2 falls  3-12 and 4-12, both were mechanical falls because she was trying hurry, no loss of consciousness. Patient is counseled, see above   Past Medical History  Diagnosis Date  . Cardiomyopathy     had normal cath 2007 w/ EF 52%  . Hx of colonic polyps   . Hypertension   . Osteopenia   . Anemia   . Osteoarthritis   . Anxiety and depression 07/16/2009  . ASD (atrial septal defect)      per Bubble study 2004   Past Surgical History  Procedure Date  . Tonsillectomy   . Back surgery 2000    due to fall  . Breast surgery     negative Bx   History   Social History  . Marital Status: Widowed    Spouse Name: N/A    Number of Children: N/A  . Years of Education: N/A   Occupational History  . Motivational Speaker    Social  History Main Topics  . Smoking status: Never Smoker   . Smokeless tobacco: Never Used  . Alcohol Use: No  . Drug Use: No  . Sexually Active: Not on file   Other Topics Concern  . Not on file   Social History Narrative   Widow, moved to [place] 2007Lives by self, volunteers at churchHas a daughter and a G-d in [place]; lost her son-in-lawwriting a bookStill goes to the [facility]   Family History  Problem Relation Age of Onset  . Diabetes Neg Hx   . Heart attack Father     F and 2 brothers   . Cancer Neg Hx     colon or breast  . Hypertension Other     Review of Systems Denies any chest pain or shortness of breath No nausea, vomiting, diarrhea. No  blood in the stools.    Objective:   Physical Exam  Constitutional: She is oriented to person, place, and time. She appears well-developed and well-nourished. No distress.  HENT:  Head: Normocephalic and atraumatic.  Neck: Normal range of motion. Neck supple. No thyromegaly present.  Cardiovascular: Normal rate, regular rhythm and normal heart sounds.   No murmur heard. Pulmonary/Chest: Effort normal and breath sounds normal. No respiratory distress.  She has no wheezes. She has no rales.  Abdominal: Soft. Bowel sounds are normal. She exhibits no distension. There is no tenderness. There is no rebound and no guarding.  Genitourinary:       Breast exam normal, no dominant nodules, axillary areas free of mass or lymphadenopathy  Musculoskeletal: She exhibits no edema.  Neurological: She is alert and oriented to person, place, and time.  Skin: She is not diaphoretic.  Psychiatric: She has a normal mood and affect. Her behavior is normal. Judgment and thought content normal.          Assessment & Plan:

## 2011-09-04 NOTE — Assessment & Plan Note (Signed)
Seems stable 

## 2011-09-04 NOTE — Assessment & Plan Note (Signed)
Well controlled 

## 2011-09-04 NOTE — Assessment & Plan Note (Signed)
Taking klonopin, effexor and remeron x years (started before she moved to [place]), sx well controlled: no change , RF PRN

## 2011-09-09 ENCOUNTER — Other Ambulatory Visit: Payer: Self-pay | Admitting: Internal Medicine

## 2011-09-13 ENCOUNTER — Telehealth: Payer: Self-pay

## 2011-09-13 NOTE — Telephone Encounter (Signed)
Pt received lab work and noticed that calcium level is slightly elevated. She wants MD to know that she is taking calcium/vit d 600/125u bid and vit d3 2000u qd. She would like to know if she should stop taking one of the supplements. Please advise

## 2011-09-14 NOTE — Telephone Encounter (Signed)
Please reduce calcium to 600 mg daily and vitamin D to 600u

## 2011-09-15 NOTE — Telephone Encounter (Signed)
Pt aware of MD's note and verbalized understanding. Advised pt to call back with questions or concerns

## 2011-09-19 ENCOUNTER — Telehealth: Payer: Self-pay

## 2011-09-19 NOTE — Telephone Encounter (Signed)
Pt called to ask about her vit d supplement. She states that it come in 400 iu or 1000 iu and wanted to know which one would be best for her to take.  Advised pt that the 400 iu is closer to 600 iu than the 1000 iu, so she should be ok to take the 400 iu along with the vit d that she is going to get from the calcium supplement. Advised pt the her levels will be checked periodically and meds will be adjusted accordingly. Pt verbalized understanding

## 2011-09-25 ENCOUNTER — Encounter: Payer: Self-pay | Admitting: Internal Medicine

## 2011-10-13 ENCOUNTER — Other Ambulatory Visit: Payer: Self-pay | Admitting: Internal Medicine

## 2011-12-14 ENCOUNTER — Other Ambulatory Visit: Payer: Self-pay | Admitting: Internal Medicine

## 2011-12-14 MED ORDER — CLONAZEPAM 0.5 MG PO TABS: ORAL_TABLET | ORAL | Status: DC

## 2011-12-14 NOTE — Telephone Encounter (Signed)
Refill done.  

## 2011-12-15 ENCOUNTER — Other Ambulatory Visit: Payer: Self-pay | Admitting: Internal Medicine

## 2011-12-15 NOTE — Telephone Encounter (Signed)
Refill done.  

## 2012-01-15 ENCOUNTER — Telehealth: Payer: Self-pay | Admitting: Internal Medicine

## 2012-01-15 NOTE — Telephone Encounter (Signed)
Patient states she called Friday about getting a handicap sticker mailed to her. I do not see anything in the chart regarding the request. Patient states that this is a time-sensitive matter. Best # [phone]

## 2012-01-15 NOTE — Telephone Encounter (Signed)
Done already

## 2012-01-15 NOTE — Telephone Encounter (Signed)
I called and spoke with patient and informed her message received, I will send to MD for review with the indication (Time Sensitive). Patient stated she received a ticket for an outdated Handicap sticker that she currently has. Patient would like to pick-up when available   Dr.Paz please advise if ok to provide patient with Handicap sticker

## 2012-01-18 ENCOUNTER — Other Ambulatory Visit: Payer: Self-pay | Admitting: Internal Medicine

## 2012-01-18 NOTE — Telephone Encounter (Signed)
Refill done.  

## 2012-01-26 ENCOUNTER — Ambulatory Visit (INDEPENDENT_AMBULATORY_CARE_PROVIDER_SITE_OTHER): Payer: Medicare Other | Admitting: Internal Medicine

## 2012-01-26 VITALS — BP 118/78 | HR 87 | Temp 98.2°F | Wt 143.0 lb

## 2012-01-26 DIAGNOSIS — R221 Localized swelling, mass and lump, neck: Secondary | ICD-10-CM

## 2012-01-26 DIAGNOSIS — M1990 Unspecified osteoarthritis, unspecified site: Secondary | ICD-10-CM

## 2012-01-26 DIAGNOSIS — R220 Localized swelling, mass and lump, head: Secondary | ICD-10-CM

## 2012-01-26 NOTE — Assessment & Plan Note (Signed)
L shoulder pain, ?rotator cuff Discussed options: pain meds, ortho referral, PT Elected PT If no better , refer to ortho

## 2012-01-26 NOTE — Assessment & Plan Note (Signed)
benigh features, ?salivary gland; discussed ENT vs observation We agreed on observation, re asses on RTC

## 2012-01-26 NOTE — Progress Notes (Signed)
  Subjective:    Patient ID: [name] [name], female    DOB: [birth year], 78 y.o.   MRN: [id number]  HPI Acute visit 2 days ago she found a lump on the right side of the neck. No pain-hurting. Denies any sore throat, runny nose. No recent dental work, no dental pain. She is a nonsmoker.  Also for the last 2 months is complaining of left shoulder pain, mostly when she elevated her arm and also when she reaches across. Denies any neck pain or upper extremity paresthesias. Has not taken any meds .  Past Medical History  Diagnosis Date  . Cardiomyopathy     had normal cath 2007 w/ EF 52%  . Hx of colonic polyps   . Hypertension   . Osteopenia   . Anemia   . Osteoarthritis   . Anxiety and depression 07/16/2009  . ASD (atrial septal defect)      per Bubble study 2004   Past Surgical History  Procedure Date  . Tonsillectomy   . Back surgery 2000    due to fall  . Breast surgery     negative Bx      Review of Systems See HPI    Objective:   Physical Exam  HENT:  Head:         Dentition in good condition, palpation of the lower gum without evidence of an abscess. No mucosal lesions found.  Neck: No thyromegaly present.       No supraclavicular mass  Musculoskeletal:       Shoulders symmetric, range of motion normal. Shoulder pain on the left elicited by arm elevation and reaching across.          Assessment & Plan:

## 2012-01-29 ENCOUNTER — Encounter: Payer: Self-pay | Admitting: Internal Medicine

## 2012-02-08 ENCOUNTER — Ambulatory Visit: Payer: Medicare Other | Attending: Internal Medicine | Admitting: Physical Therapy

## 2012-02-08 DIAGNOSIS — IMO0001 Reserved for inherently not codable concepts without codable children: Secondary | ICD-10-CM | POA: Insufficient documentation

## 2012-02-08 DIAGNOSIS — M25519 Pain in unspecified shoulder: Secondary | ICD-10-CM | POA: Insufficient documentation

## 2012-02-08 DIAGNOSIS — M25619 Stiffness of unspecified shoulder, not elsewhere classified: Secondary | ICD-10-CM | POA: Insufficient documentation

## 2012-02-22 ENCOUNTER — Ambulatory Visit: Payer: Medicare Other | Attending: Internal Medicine | Admitting: Physical Therapy

## 2012-02-22 DIAGNOSIS — M25619 Stiffness of unspecified shoulder, not elsewhere classified: Secondary | ICD-10-CM | POA: Insufficient documentation

## 2012-02-22 DIAGNOSIS — M25519 Pain in unspecified shoulder: Secondary | ICD-10-CM | POA: Insufficient documentation

## 2012-02-22 DIAGNOSIS — IMO0001 Reserved for inherently not codable concepts without codable children: Secondary | ICD-10-CM | POA: Insufficient documentation

## 2012-02-28 ENCOUNTER — Ambulatory Visit (INDEPENDENT_AMBULATORY_CARE_PROVIDER_SITE_OTHER): Payer: Medicare Other | Admitting: Internal Medicine

## 2012-02-28 ENCOUNTER — Encounter: Payer: Self-pay | Admitting: Internal Medicine

## 2012-02-28 VITALS — BP 132/84 | HR 70 | Temp 97.8°F | Wt 144.0 lb

## 2012-02-28 DIAGNOSIS — F329 Major depressive disorder, single episode, unspecified: Secondary | ICD-10-CM

## 2012-02-28 DIAGNOSIS — F341 Dysthymic disorder: Secondary | ICD-10-CM

## 2012-02-28 MED ORDER — VENLAFAXINE HCL ER 75 MG PO CP24: 150.0000 mg | ORAL_CAPSULE | Freq: Every day | ORAL | Status: DC

## 2012-02-28 NOTE — Assessment & Plan Note (Signed)
Fatigue most likely due to to increase in anxiety and depression. She is counseled today here. Plan: List of local counselors provided to patient. She actually takes 1 Remeron at night (instead of two), no change Increase Klonopin to twice a day when necessary. Increase Effexor from 75-150 mg daily. Reassess in 4-6 weeks Patient knows to call me if something changes or if she gets worse.

## 2012-02-28 NOTE — Progress Notes (Signed)
  Subjective:    Patient ID: [name] [name], female    DOB: 1927-11-12, 78 y.o.   MRN: [id number]  HPI Acute visit, here with her daughter 2 weeks history of decreased energy, she thinks related to anxiety. She lost her son in law a year ago and she still has a difficult time dealing with that. April is the month of his death anniversary. She is also somehow depressed but not suicidal.  Past Medical History  Diagnosis Date  . Cardiomyopathy     had normal cath 2007 w/ EF 52%  . Hx of colonic polyps   . Hypertension   . Osteopenia   . Anemia   . Osteoarthritis   . Anxiety and depression 07/16/2009  . ASD (atrial septal defect)      per Bubble study 2004   Past Surgical History  Procedure Date  . Tonsillectomy   . Back surgery 2000    due to fall  . Breast surgery     negative Bx     Review of Systems No fever or chills No chest pain or shortness of breath. No lower extremity edema. Note nausea, vomiting, diarrhea or blood in the stools. No headaches. Ambulatory BPs ranging from 100/60-130/76. Most of the numbers are within normal.     Objective:   Physical Exam General -- alert, well-developed, and well-nourished. slt anxious and depress appearing but no distress   Neck --no JVD Lungs -- normal respiratory effort, no intercostal retractions, no accessory muscle use, and normal breath sounds.   Heart-- normal rate, regular rhythm, no murmur, and no gallop.   Extremities-- no pretibial edema bilaterally      Assessment & Plan:

## 2012-02-28 NOTE — Patient Instructions (Signed)
Clonazepam  twice a day, watch for excessive somnolence Remeron one at bedtime Effexor 2 capsules daily. Please come back in 4-6 weeks for another visit.

## 2012-02-29 ENCOUNTER — Encounter: Payer: Medicare Other | Admitting: Physical Therapy

## 2012-03-04 ENCOUNTER — Telehealth: Payer: Self-pay | Admitting: Internal Medicine

## 2012-03-04 NOTE — Telephone Encounter (Signed)
Last seen 02/28/12 and filled 12/14/11 # 30 please advise    KP

## 2012-03-04 NOTE — Telephone Encounter (Signed)
Klonopin 0.5mg  tab. Take one-half tablet by mouth at bedtime  Pharmacy comments: patient says should be 1/2 in morning and 1/2 at night.

## 2012-03-05 ENCOUNTER — Encounter: Payer: Medicare Other | Admitting: Physical Therapy

## 2012-03-05 NOTE — Telephone Encounter (Signed)
Call  #30 and 5 refills. It is ok half tablet twice a day.

## 2012-03-06 ENCOUNTER — Ambulatory Visit: Payer: Medicare Other | Admitting: Internal Medicine

## 2012-03-06 MED ORDER — CLONAZEPAM 0.5 MG PO TABS: 0.2500 mg | ORAL_TABLET | Freq: Two times a day (BID) | ORAL | Status: DC | PRN

## 2012-03-06 NOTE — Telephone Encounter (Signed)
Addended by: Arnette Norris on: 03/06/2012 02:51 PM   Modules accepted: Orders

## 2012-03-06 NOTE — Telephone Encounter (Signed)
Rx reprinted in Dr.Paz's name and faxed.     KP

## 2012-03-07 ENCOUNTER — Telehealth: Payer: Self-pay | Admitting: Internal Medicine

## 2012-03-07 ENCOUNTER — Encounter: Payer: Medicare Other | Admitting: Physical Therapy

## 2012-03-07 DIAGNOSIS — R221 Localized swelling, mass and lump, neck: Secondary | ICD-10-CM

## 2012-03-07 NOTE — Telephone Encounter (Signed)
Advised patient, I got a note  from Dr. Bradly Chris, dentist, he recommended a ENT referral for the swelling in the neck. Please arrange ENT ref

## 2012-03-08 NOTE — Telephone Encounter (Signed)
Discussed with pt, arranged referral.

## 2012-03-11 ENCOUNTER — Telehealth: Payer: Self-pay | Admitting: Internal Medicine

## 2012-03-11 NOTE — Telephone Encounter (Signed)
Patients daughter Harrison Mons called ans states mother is doing better but has increased sleepiness with new medications/dosages. Harrison Mons was not sure which medication was causing the reaction, could be either Effexor or Klonopin. Valeria's ph# [phone]

## 2012-03-11 NOTE — Telephone Encounter (Signed)
Continue with Effexor. Change clonazepam to half tablet at bedtime only. (I already corrected the medication list) Most likely clonazepam is what makes her sleepy.

## 2012-03-11 NOTE — Telephone Encounter (Signed)
Discussed with pt's daughter

## 2012-03-11 NOTE — Telephone Encounter (Signed)
Please advise 

## 2012-03-14 ENCOUNTER — Other Ambulatory Visit: Payer: Self-pay | Admitting: Internal Medicine

## 2012-03-14 NOTE — Telephone Encounter (Signed)
Refill done.  

## 2012-03-28 ENCOUNTER — Ambulatory Visit: Payer: Self-pay | Admitting: Internal Medicine

## 2012-04-02 ENCOUNTER — Ambulatory Visit (INDEPENDENT_AMBULATORY_CARE_PROVIDER_SITE_OTHER): Payer: Medicare Other | Admitting: Internal Medicine

## 2012-04-02 VITALS — BP 130/84 | HR 68 | Temp 97.9°F | Wt 144.0 lb

## 2012-04-02 DIAGNOSIS — R220 Localized swelling, mass and lump, head: Secondary | ICD-10-CM

## 2012-04-02 DIAGNOSIS — M1990 Unspecified osteoarthritis, unspecified site: Secondary | ICD-10-CM

## 2012-04-02 DIAGNOSIS — F419 Anxiety disorder, unspecified: Secondary | ICD-10-CM

## 2012-04-02 DIAGNOSIS — R221 Localized swelling, mass and lump, neck: Secondary | ICD-10-CM

## 2012-04-02 DIAGNOSIS — F341 Dysthymic disorder: Secondary | ICD-10-CM

## 2012-04-02 DIAGNOSIS — I10 Essential (primary) hypertension: Secondary | ICD-10-CM

## 2012-04-02 DIAGNOSIS — F32A Depression, unspecified: Secondary | ICD-10-CM

## 2012-04-02 DIAGNOSIS — M899 Disorder of bone, unspecified: Secondary | ICD-10-CM

## 2012-04-02 LAB — BASIC METABOLIC PANEL
CO2: 29 mEq/L (ref 19–32)
Chloride: 102 mEq/L (ref 96–112)
Potassium: 4 mEq/L (ref 3.5–5.1)
Sodium: 140 mEq/L (ref 135–145)

## 2012-04-02 NOTE — Patient Instructions (Signed)
Come back in 5 months for a general checkup. In the meantime, if anxiety and depression are not as well-controlled as you like them to be, let me know

## 2012-04-02 NOTE — Assessment & Plan Note (Addendum)
Definitely improved with current medications. Because drowsiness, we cut down on, clonazepam to half tablet at night and that is working well for her. She's not feeling "100% better" however we increase her meds only a few weeks ago, most likely she will continue improving. Plan: Continue with counseling and same medications.

## 2012-04-02 NOTE — Assessment & Plan Note (Signed)
Shoulder pain much improved, she is physical therapy. Recommend to take Tylenol as needed and avoid Motrin.

## 2012-04-02 NOTE — Assessment & Plan Note (Signed)
Saw ENT, no further eval recommended

## 2012-04-02 NOTE — Progress Notes (Signed)
  Subjective:    Patient ID: [name] [name], female    DOB: [birth year], 78 y.o.   MRN: [id number]  HPI Routine office visit As far as anxiety, she is doing better. See assessment and plan. Hypertension, good medication compliance and normal ambulatory BPs. She's taking a number of over-the-counter medications, we reviewed them. see "osteopenia"   Past Medical History  Diagnosis Date  . Cardiomyopathy     had normal cath 2007 w/ EF 52%  . Hx of colonic polyps   . Hypertension   . Osteopenia   . Anemia   . Osteoarthritis   . Anxiety and depression 07/16/2009  . ASD (atrial septal defect)      per Bubble study 2004     Review of Systems Was seen recently with shoulder pain, doing much better. No chest pain or shortness of breath No lower extremity edema     Objective:   Physical Exam  General -- alert, well-developed, and well-nourished.   Lungs -- normal respiratory effort, no intercostal retractions, no accessory muscle use, and normal breath sounds.   Heart-- normal rate, regular rhythm, no murmur, and no gallop.   Psych--  seems to be doing great, better compared to the last time she was here.    Assessment & Plan:

## 2012-04-02 NOTE — Assessment & Plan Note (Signed)
She is taking a number of over-the-counter vitamins and supplements, we review all of them, she was taking too much vitamin D.  I recommend her to continue with  --multivitamin 1 tablet daily  --calcium and vitamin D tablet

## 2012-04-02 NOTE — Assessment & Plan Note (Signed)
BP well-controlled, check a BMP 

## 2012-04-03 ENCOUNTER — Encounter: Payer: Self-pay | Admitting: Internal Medicine

## 2012-04-05 ENCOUNTER — Encounter: Payer: Self-pay | Admitting: *Deleted

## 2012-06-18 ENCOUNTER — Ambulatory Visit (INDEPENDENT_AMBULATORY_CARE_PROVIDER_SITE_OTHER): Payer: Medicare Other | Admitting: Family Medicine

## 2012-06-18 ENCOUNTER — Telehealth: Payer: Self-pay | Admitting: Internal Medicine

## 2012-06-18 ENCOUNTER — Encounter: Payer: Self-pay | Admitting: Family Medicine

## 2012-06-18 VITALS — BP 120/82 | HR 83 | Temp 97.8°F | Wt 143.2 lb

## 2012-06-18 DIAGNOSIS — M62838 Other muscle spasm: Secondary | ICD-10-CM

## 2012-06-18 DIAGNOSIS — S20219A Contusion of unspecified front wall of thorax, initial encounter: Secondary | ICD-10-CM

## 2012-06-18 MED ORDER — CYCLOBENZAPRINE HCL 10 MG PO TABS: 10.0000 mg | ORAL_TABLET | Freq: Three times a day (TID) | ORAL | Status: AC | PRN
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
## 2012-06-18 NOTE — Progress Notes (Signed)
  Subjective:    Patient ID: [name] [name], female    DOB: [birth year], 78 y.o.   MRN: [id number]  HPI Pt here c/o pain in breast and brusing after falling down steps in w/s.  She also has a contusion on her forehead and L elbow. No visual changes, no chest pain, no headaches.   No Loc.  EMS came but pt did not go to hospital.   Review of Systems    as above Objective:   Physical Exam  Constitutional: She is oriented to person, place, and time. She appears well-developed and well-nourished.  Musculoskeletal: She exhibits no edema and no tenderness.       Pain in L shoulder and trap--+ muscle spasms  Neurological: She is alert and oriented to person, place, and time.  Skin:       Ecchymosis-- resolving R breast and chest + abrasion L elbow  Psychiatric: She has a normal mood and affect. Her behavior is normal. Judgment and thought content normal.          Assessment & Plan:  Cervical strain=--- flexeril 10 mg po qhs                               Warm compresses Mult ecchymosis--- warm compresses                                They are already resolving

## 2012-06-23 ENCOUNTER — Other Ambulatory Visit: Payer: Self-pay | Admitting: Internal Medicine

## 2012-08-31 ENCOUNTER — Other Ambulatory Visit: Payer: Self-pay | Admitting: Internal Medicine

## 2012-09-01 ENCOUNTER — Other Ambulatory Visit: Payer: Self-pay | Admitting: Internal Medicine

## 2012-09-02 ENCOUNTER — Ambulatory Visit (INDEPENDENT_AMBULATORY_CARE_PROVIDER_SITE_OTHER): Payer: Medicare Other | Admitting: Internal Medicine

## 2012-09-02 ENCOUNTER — Encounter: Payer: Self-pay | Admitting: Internal Medicine

## 2012-09-02 VITALS — BP 118/82 | HR 89 | Temp 98.3°F | Wt 141.0 lb

## 2012-09-02 DIAGNOSIS — F419 Anxiety disorder, unspecified: Secondary | ICD-10-CM

## 2012-09-02 DIAGNOSIS — S139XXA Sprain of joints and ligaments of unspecified parts of neck, initial encounter: Secondary | ICD-10-CM

## 2012-09-02 DIAGNOSIS — I10 Essential (primary) hypertension: Secondary | ICD-10-CM

## 2012-09-02 DIAGNOSIS — F341 Dysthymic disorder: Secondary | ICD-10-CM

## 2012-09-02 DIAGNOSIS — Z23 Encounter for immunization: Secondary | ICD-10-CM

## 2012-09-02 NOTE — Telephone Encounter (Signed)
Rx sent.    MW 

## 2012-09-02 NOTE — Assessment & Plan Note (Signed)
Doing great at the present time. No change
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
## 2012-09-02 NOTE — Patient Instructions (Signed)
Warm compress to neck twice a day Tylenol  500 mg OTC 2 tabs a day every 8 hours as needed for pain Call oif not improving in the next 2-3 weeks. Call any time if pain increases
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
## 2012-09-16 ENCOUNTER — Telehealth: Payer: Self-pay | Admitting: Internal Medicine

## 2012-09-16 NOTE — Telephone Encounter (Signed)
Ok to refill 
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
## 2012-09-17 MED ORDER — CLONAZEPAM 0.5 MG PO TABS: 0.2500 mg | ORAL_TABLET | Freq: Every evening | ORAL | Status: DC | PRN

## 2012-09-17 NOTE — Telephone Encounter (Signed)
done

## 2012-09-26 ENCOUNTER — Telehealth: Payer: Self-pay

## 2012-09-26 NOTE — Telephone Encounter (Signed)
Pt called [facility] triage line asking could she take echinacea with her current med? Plz advise      MW

## 2012-09-27 NOTE — Telephone Encounter (Signed)
Echinacea has several interactions, recommend not to take

## 2012-10-01 NOTE — Telephone Encounter (Signed)
Left detailed msg on pt's vmail.  

## 2012-10-09 ENCOUNTER — Ambulatory Visit (INDEPENDENT_AMBULATORY_CARE_PROVIDER_SITE_OTHER): Payer: Medicare Other | Admitting: Internal Medicine

## 2012-10-09 VITALS — BP 144/74 | HR 71 | Temp 97.4°F | Wt 147.0 lb

## 2012-10-09 DIAGNOSIS — F341 Dysthymic disorder: Secondary | ICD-10-CM

## 2012-10-09 DIAGNOSIS — F329 Major depressive disorder, single episode, unspecified: Secondary | ICD-10-CM

## 2012-10-09 DIAGNOSIS — I10 Essential (primary) hypertension: Secondary | ICD-10-CM

## 2012-10-09 LAB — CBC WITH DIFFERENTIAL/PLATELET
Basophils Relative: 1 % (ref 0–1)
Eosinophils Absolute: 0.1 10*3/uL (ref 0.0–0.7)
Hemoglobin: 11.6 g/dL — ABNORMAL LOW (ref 12.0–15.0)
Lymphs Abs: 1.8 10*3/uL (ref 0.7–4.0)
Monocytes Relative: 10 % (ref 3–12)
Neutro Abs: 3.5 10*3/uL (ref 1.7–7.7)
Neutrophils Relative %: 58 % (ref 43–77)
Platelets: 234 10*3/uL (ref 150–400)
RBC: 3.59 MIL/uL — ABNORMAL LOW (ref 3.87–5.11)

## 2012-10-09 LAB — BASIC METABOLIC PANEL
BUN: 19 mg/dL (ref 6–23)
Chloride: 102 mEq/L (ref 96–112)
Creat: 1.05 mg/dL (ref 0.50–1.10)
Glucose, Bld: 75 mg/dL (ref 70–99)

## 2012-10-09 MED ORDER — AMLODIPINE BESYLATE 5 MG PO TABS: 5.0000 mg | ORAL_TABLET | Freq: Every day | ORAL | Status: DC

## 2012-10-09 NOTE — Assessment & Plan Note (Signed)
Has been very well controlled up until last week. Has changed her diet , BP is elevated and has gain weight. plan Add  Amlodipine Labs

## 2012-10-09 NOTE — Assessment & Plan Note (Addendum)
Used to be better, currently on a good medicsl regimen; at some point she was recommended to take Remeron 60 mg but she is currently taking 30 mg daily, I'm somehow reluctant to increase the doses. Plan: rec referral to psych, list provided

## 2012-10-09 NOTE — Progress Notes (Signed)
  Subjective:    Patient ID: [name] [name], female    DOB: 1927-09-27, 78 y.o.   MRN: [id number]  HPI Here to discuss the following issues: BP has been usually around 120/78, in the last few days has been in the 150s, this morning was 160/89. Also complains of increased anxiety and depression, the last time I talk to her 09/02/2012 she was doing great, she reports that November it is the month she lost her husband, also lost a son in Social worker.  Past Medical History  Diagnosis Date  . Cardiomyopathy     had normal cath 2007 w/ EF 52%  . Hx of colonic polyps   . Hypertension   . Osteopenia   . Anemia   . Osteoarthritis   . Anxiety and depression 07/16/2009  . ASD (atrial septal defect)      per Bubble study 2004   Past Surgical History  Procedure Date  . Tonsillectomy   . Back surgery 2000    due to fall  . Breast surgery     negative Bx      Review of Systems Admits to anxiety as well, feeling fidgety. Denies suicidal ideas. In the morning she feels that she doesn't like to do anything, she sleeps 8 hours qhs but "could sleep more";  she still able to exercise 3 times a week.  Appetite remains good, no weight loss, she actually has gained some weight. She did  change her diet a little but is not taking more salt than usual. Taking medications correctly, not taking OTCs, specifically not taking Motrin and Motrin-like medications. Denies headaches, chest pain, shortness of breath or cough. No nausea, vomiting, diarrhea.     Objective:   Physical Exam General -- alert, well-developed, and well-nourished.   Neck --no JVD at 45  HEENT --not pale or jaundice  Lungs -- normal respiratory effort, no intercostal retractions, no accessory muscle use, and normal breath sounds.   Heart-- normal rate, regular rhythm, no murmur, and no gallop.    Extremities-- no pretibial edema bilaterally Psych-- Cognition and judgment appear intact. Alert and cooperative with normal attention  span and concentration.  slt  anxious but not depress appearing , PHQ 9 is 15     Assessment & Plan:

## 2012-10-09 NOTE — Patient Instructions (Addendum)
Take lisinopril in the mornings Add amlodipine, one tablet every morning as well. Other medications are the same, see list below. Come back in one month. Check the  blood pressure 2 or 3 times a week, be sure it is between 110/60 and 140/85. If it is consistently higher or lower, let me know

## 2012-10-11 ENCOUNTER — Encounter: Payer: Self-pay | Admitting: *Deleted

## 2012-10-14 ENCOUNTER — Other Ambulatory Visit: Payer: Self-pay | Admitting: Internal Medicine

## 2012-10-14 NOTE — Telephone Encounter (Signed)
Please advise      KP 

## 2012-10-16 ENCOUNTER — Other Ambulatory Visit: Payer: Self-pay | Admitting: Internal Medicine

## 2012-10-16 NOTE — Telephone Encounter (Signed)
Refill done.  

## 2012-10-16 NOTE — Telephone Encounter (Signed)
She is on Remeron 30 mg 1 tablet at bedtime. Okay to call #30 and 2 RF

## 2012-10-16 NOTE — Telephone Encounter (Signed)
refill Mirtazapine (Tab) 30 MG 30 mg at bedtime.  #30  last fill 10.4.13--last OV wt/labs 11.27.13

## 2012-10-17 ENCOUNTER — Encounter: Payer: Self-pay | Admitting: Internal Medicine

## 2012-10-22 ENCOUNTER — Other Ambulatory Visit: Payer: Self-pay | Admitting: Family Medicine

## 2012-11-01 ENCOUNTER — Encounter: Payer: Self-pay | Admitting: Internal Medicine

## 2012-11-07 ENCOUNTER — Encounter: Payer: Self-pay | Admitting: Lab

## 2012-11-08 ENCOUNTER — Encounter: Payer: Self-pay | Admitting: Internal Medicine

## 2012-11-08 ENCOUNTER — Other Ambulatory Visit (INDEPENDENT_AMBULATORY_CARE_PROVIDER_SITE_OTHER): Payer: Medicare Other

## 2012-11-08 ENCOUNTER — Ambulatory Visit (INDEPENDENT_AMBULATORY_CARE_PROVIDER_SITE_OTHER): Payer: Medicare Other | Admitting: Internal Medicine

## 2012-11-08 ENCOUNTER — Ambulatory Visit: Payer: Medicare Other | Admitting: Internal Medicine

## 2012-11-08 VITALS — BP 126/82 | HR 79 | Temp 97.8°F | Wt 143.0 lb

## 2012-11-08 DIAGNOSIS — Z79899 Other long term (current) drug therapy: Secondary | ICD-10-CM

## 2012-11-08 DIAGNOSIS — I10 Essential (primary) hypertension: Secondary | ICD-10-CM

## 2012-11-08 DIAGNOSIS — F341 Dysthymic disorder: Secondary | ICD-10-CM

## 2012-11-08 DIAGNOSIS — F3131 Bipolar disorder, current episode depressed, mild: Secondary | ICD-10-CM

## 2012-11-08 DIAGNOSIS — F419 Anxiety disorder, unspecified: Secondary | ICD-10-CM

## 2012-11-08 LAB — HEMOGLOBIN A1C: Hgb A1c MFr Bld: 6.3 % (ref 4.6–6.5)

## 2012-11-08 LAB — LDL CHOLESTEROL, DIRECT: Direct LDL: 116.5 mg/dL

## 2012-11-08 LAB — LIPID PANEL: VLDL: 10 mg/dL (ref 0.0–40.0)

## 2012-11-08 NOTE — Assessment & Plan Note (Signed)
Since the last time she was here, ambulatory BPs are normal. Eventually she decided not to take amlodipine due to potential for side effects. Plan: Continue with present care

## 2012-11-08 NOTE — Progress Notes (Signed)
  Subjective:    Patient ID: [name] [name], female    DOB: Apr 09, 1927, 78 y.o.   MRN: [id number]  HPI Followup from previous visit Since the last time she was here, she went to see Dr. Tomasa Rand, medications were changed, she was diagnosed with bipolar. See assessment and plan Hypertension, after she talked to the pharmacist about  amlodipine, she decided not to take it due to to potential side effects.  Past Medical History  Diagnosis Date  . Cardiomyopathy     had normal cath 2007 w/ EF 52%  . Hx of colonic polyps   . Hypertension   . Osteopenia   . Anemia   . Osteoarthritis   . Anxiety and depression 07/16/2009  . ASD (atrial septal defect)      per Bubble study 2004   Past Surgical History  Procedure Date  . Tonsillectomy   . Back surgery 2000    due to fall  . Breast surgery     negative Bx    Review of Systems In general feeling well, depression improved. Ambulatory BPs 136/79 on average.     Objective:   Physical Exam Alert oriented x3, no apparent distress. Emotionally he seems to be doing better       Assessment & Plan:

## 2012-11-08 NOTE — Assessment & Plan Note (Addendum)
S/p evsal by Dr Koren Bound, she was diagnosed with bipolar, started latuda, Lamictal and Risperdal. They are  weaning her off Effexor. She still has some difficulty with insomnia and I recommended today to increase clonazepam from half tablet to one tablet at night. Dr. Tomasa Rand request labs: A1c 6.3, cholesterol normal. Will fax results to  [phone]. Discussed w/  the patient  that a A1c of 6.3 is in the high side of normal consequently we need to recheck from time to time.

## 2012-11-22 ENCOUNTER — Other Ambulatory Visit: Payer: Self-pay | Admitting: Internal Medicine

## 2012-11-22 NOTE — Telephone Encounter (Signed)
Refill done.  

## 2012-11-28 ENCOUNTER — Other Ambulatory Visit: Payer: Self-pay | Admitting: Internal Medicine

## 2012-11-28 MED ORDER — CLONAZEPAM 0.5 MG PO TABS: 0.5000 mg | ORAL_TABLET | Freq: Every evening | ORAL | Status: DC | PRN

## 2012-11-28 NOTE — Telephone Encounter (Signed)
Done

## 2012-11-28 NOTE — Telephone Encounter (Signed)
Ok to refill? Last OV 12.27.13 Last filled 11.5.13

## 2012-11-28 NOTE — Telephone Encounter (Signed)
REFILL ClonazePAM (Tab) 0.5 MG Take 0.5 mg by mouth at bedtime as needed #30 wt/5-refills. --PT req up directions to 1 HS per hand wrt note from pharmacy--last fill 12.28.13

## 2012-12-28 ENCOUNTER — Emergency Department (HOSPITAL_COMMUNITY)
Admission: EM | Admit: 2012-12-28 | Discharge: 2012-12-29 | Disposition: A | Discharge status: Home / Self Care | Payer: Medicare Other | Attending: Emergency Medicine | Admitting: Emergency Medicine

## 2012-12-28 DIAGNOSIS — E871 Hypo-osmolality and hyponatremia: Secondary | ICD-10-CM | POA: Insufficient documentation

## 2012-12-28 DIAGNOSIS — F341 Dysthymic disorder: Secondary | ICD-10-CM | POA: Insufficient documentation

## 2012-12-28 DIAGNOSIS — F329 Major depressive disorder, single episode, unspecified: Secondary | ICD-10-CM

## 2012-12-28 DIAGNOSIS — Q2111 Secundum atrial septal defect: Secondary | ICD-10-CM | POA: Insufficient documentation

## 2012-12-28 DIAGNOSIS — I10 Essential (primary) hypertension: Secondary | ICD-10-CM | POA: Insufficient documentation

## 2012-12-28 DIAGNOSIS — R4182 Altered mental status, unspecified: Secondary | ICD-10-CM

## 2012-12-28 DIAGNOSIS — Z860100 Personal history of colon polyps, unspecified: Secondary | ICD-10-CM | POA: Insufficient documentation

## 2012-12-28 DIAGNOSIS — Z7982 Long term (current) use of aspirin: Secondary | ICD-10-CM | POA: Insufficient documentation

## 2012-12-28 DIAGNOSIS — Z862 Personal history of diseases of the blood and blood-forming organs and certain disorders involving the immune mechanism: Secondary | ICD-10-CM | POA: Insufficient documentation

## 2012-12-28 DIAGNOSIS — I428 Other cardiomyopathies: Secondary | ICD-10-CM | POA: Insufficient documentation

## 2012-12-28 DIAGNOSIS — Q211 Atrial septal defect: Secondary | ICD-10-CM | POA: Insufficient documentation

## 2012-12-28 DIAGNOSIS — Z79899 Other long term (current) drug therapy: Secondary | ICD-10-CM | POA: Insufficient documentation

## 2012-12-28 DIAGNOSIS — E876 Hypokalemia: Secondary | ICD-10-CM | POA: Insufficient documentation

## 2012-12-28 DIAGNOSIS — Z8739 Personal history of other diseases of the musculoskeletal system and connective tissue: Secondary | ICD-10-CM | POA: Insufficient documentation

## 2012-12-28 DIAGNOSIS — M1990 Unspecified osteoarthritis, unspecified site: Secondary | ICD-10-CM | POA: Insufficient documentation

## 2012-12-28 LAB — COMPREHENSIVE METABOLIC PANEL
Alkaline Phosphatase: 53 U/L (ref 39–117)
BUN: 23 mg/dL (ref 6–23)
CO2: 23 mEq/L (ref 19–32)
Chloride: 93 mEq/L — ABNORMAL LOW (ref 96–112)
Creatinine, Ser: 1.12 mg/dL — ABNORMAL HIGH (ref 0.50–1.10)
GFR calc non Af Amer: 44 mL/min — ABNORMAL LOW (ref >90)
Glucose, Bld: 128 mg/dL — ABNORMAL HIGH (ref 70–99)
Total Bilirubin: 0.2 mg/dL — ABNORMAL LOW (ref 0.3–1.2)

## 2012-12-28 LAB — CBC WITH DIFFERENTIAL/PLATELET
HCT: 30.7 % — ABNORMAL LOW (ref 36.0–46.0)
Hemoglobin: 10.9 g/dL — ABNORMAL LOW (ref 12.0–15.0)
Lymphocytes Relative: 24 % (ref 12–46)
Lymphs Abs: 1.6 10*3/uL (ref 0.7–4.0)
MCHC: 35.5 g/dL (ref 30.0–36.0)
Monocytes Absolute: 0.7 10*3/uL (ref 0.1–1.0)
Monocytes Relative: 10 % (ref 3–12)
Neutro Abs: 4.5 10*3/uL (ref 1.7–7.7)
RBC: 3.27 MIL/uL — ABNORMAL LOW (ref 3.87–5.11)
WBC: 7 10*3/uL (ref 4.0–10.5)

## 2012-12-28 LAB — URINALYSIS, ROUTINE W REFLEX MICROSCOPIC
Glucose, UA: NEGATIVE mg/dL
Ketones, ur: NEGATIVE mg/dL
Leukocytes, UA: NEGATIVE
pH: 7 (ref 5.0–8.0)

## 2012-12-28 NOTE — ED Notes (Signed)
EMS called to home.  Found sitting, alert and oriented x3.   Family called EMS due to patient falling asleep at her computer and difficulty to arouse. Patient denies any pain.

## 2012-12-29 LAB — TROPONIN I: Troponin I: <0.3 ng/mL (ref <0.30)

## 2012-12-29 MED ORDER — POTASSIUM CHLORIDE CRYS ER 20 MEQ PO TBCR
40.0000 meq | EXTENDED_RELEASE_TABLET | Freq: Once | ORAL | Status: AC
  Administered 2012-12-29: 40 meq via ORAL
  Filled 2012-12-29: qty 2

## 2012-12-29 MED ORDER — POTASSIUM CHLORIDE ER 10 MEQ PO TBCR: 10.0000 meq | EXTENDED_RELEASE_TABLET | Freq: Two times a day (BID) | ORAL | Status: DC

## 2012-12-29 NOTE — ED Provider Notes (Signed)
History     CSN: [id number]  Arrival date & time 12/28/12  2158   First MD Initiated Contact with Patient 12/28/12 2300      Chief Complaint  Patient presents with  . Near Syncope    (Consider location/radiation/quality/duration/timing/severity/associated sxs/prior treatment) HPI 79 year old female presents to emergency department with a brief period of altered consciousness. Family reports she was sitting at the computer, and was difficult over to arouse, snoring respirations and drooling. They were finally able to shake her awake. Episode lasted about 3-4 minutes. It is unclear if she fell asleep or passed out. No previous history of syncope. Patient reports she was very tired sitting at the computer and had noted off prior to this episode. Family is concerned about the medications that she is currently taking, she was recently diagnosed with bipolar after having worsening depression over the last several months. She has been started on Lamictal, Latuda, and Seroquel. Family does not feel that the medications are helping. Patient has had decline in functioning do to feeling sad, depressed, lack of energy. She is living with her daughter currently.  Past Medical History  Diagnosis Date  . Cardiomyopathy     had normal cath 2007 w/ EF 52%  . Hx of colonic polyps   . Hypertension   . Osteopenia   . Anemia   . Osteoarthritis   . Anxiety and depression 07/16/2009  . ASD (atrial septal defect)      per Bubble study 2004    Past Surgical History  Procedure Laterality Date  . Tonsillectomy    . Back surgery  2000    due to fall  . Breast surgery      negative Bx    Family History  Problem Relation Age of Onset  . Diabetes Neg Hx   . Heart attack Father     F and 2 brothers   . Cancer Neg Hx     colon or breast  . Hypertension Other     History  Substance Use Topics  . Smoking status: Never Smoker   . Smokeless tobacco: Never Used  . Alcohol Use: No    OB History   Grav Para Term Preterm Abortions TAB SAB Ect Mult Living                  Review of Systems  See History of Present Illness; otherwise all other systems are reviewed and negative  Allergies  Shellfish allergy; Codeine; Hydrocodone; and Adhesive  Home Medications   Current Outpatient Rx  Name  Route  Sig  Dispense  Refill  . aspirin 325 MG tablet   Oral   Take 325 mg by mouth daily.           . carvedilol (COREG) 12.5 MG tablet      TAKE ONE TABLET BY MOUTH TWICE DAILY   60 tablet   4   . clonazePAM (KLONOPIN) 0.5 MG tablet   Oral   Take 1 tablet (0.5 mg total) by mouth at bedtime as needed.   30 tablet   3   . lamoTRIgine (LAMICTAL) 25 MG tablet   Oral   Take 25 mg by mouth daily. 2 tablets at bed time x14 days then 4 tablets at bedtime.         Marland Kitchen lisinopril-hydrochlorothiazide (PRINZIDE,ZESTORETIC) 20-12.5 MG per tablet      TAKE TWO TABLETS BY MOUTH DAILY   60 tablet   4   . losartan (COZAAR) 100 MG  tablet      TAKE ONE TABLET BY MOUTH ONE TIME DAILY   90 tablet   1   . Lurasidone HCl (LATUDA) 60 MG TABS   Oral   Take 1 tablet by mouth daily.         . mirtazapine (REMERON) 30 MG tablet      30 mg at bedtime.          . Multiple Vitamin (MULTIVITAMIN) tablet   Oral   Take 1 tablet by mouth daily.           . Psyllium (METAMUCIL) 30.9 % POWD   Oral   Take by mouth as needed.           . risperiDONE (RISPERDAL) 0.5 MG tablet   Oral   Take 0.5 mg by mouth at bedtime.         Marland Kitchen venlafaxine XR (EFFEXOR-XR) 75 MG 24 hr capsule                 BP 169/81  Pulse 77  Temp(Src) 98 F (36.7 C)  Resp 16  SpO2 100%  Physical Exam  Nursing note and vitals reviewed. Constitutional: She is oriented to person, place, and time. She appears well-developed and well-nourished.  HENT:  Head: Normocephalic and atraumatic.  Right Ear: External ear normal.  Left Ear: External ear normal.  Nose: Nose normal.  Mouth/Throat: Oropharynx is  clear and moist.  Eyes: Conjunctivae and EOM are normal. Pupils are equal, round, and reactive to light.  Neck: Normal range of motion. Neck supple. No JVD present. No tracheal deviation present. No thyromegaly present.  Cardiovascular: Normal rate, regular rhythm, normal heart sounds and intact distal pulses.  Exam reveals no gallop and no friction rub.   No murmur heard. Pulmonary/Chest: Effort normal and breath sounds normal. No stridor. No respiratory distress. She has no wheezes. She has no rales. She exhibits no tenderness.  Abdominal: Soft. Bowel sounds are normal. She exhibits no distension and no mass. There is no tenderness. There is no rebound and no guarding.  Musculoskeletal: Normal range of motion. She exhibits no edema and no tenderness.  Lymphadenopathy:    She has no cervical adenopathy.  Neurological: She is alert and oriented to person, place, and time. She has normal reflexes. No cranial nerve deficit. She exhibits normal muscle tone. Coordination normal.  Skin: Skin is warm and dry. No rash noted. No erythema. No pallor.  Psychiatric: She has a normal mood and affect. Her behavior is normal. Judgment and thought content normal.    ED Course  Procedures (including critical care time)  Labs Reviewed  CBC WITH DIFFERENTIAL - Abnormal; Notable for the following:    RBC 3.27 (*)    Hemoglobin 10.9 (*)    HCT 30.7 (*)    All other components within normal limits  COMPREHENSIVE METABOLIC PANEL - Abnormal; Notable for the following:    Sodium 129 (*)    Potassium 3.1 (*)    Chloride 93 (*)    Glucose, Bld 128 (*)    Creatinine, Ser 1.12 (*)    Total Bilirubin 0.2 (*)    GFR calc non Af Amer 44 (*)    GFR calc Af Amer 51 (*)    All other components within normal limits  URINALYSIS, ROUTINE W REFLEX MICROSCOPIC  TROPONIN I   No results found.   Date: 12/28/2012  Rate: 73  Rhythm: normal sinus rhythm  QRS Axis: normal  Intervals: normal  ST/T Wave abnormalities:  nonspecific T wave changes  Conduction Disutrbances:none  Narrative Interpretation:   Old EKG Reviewed: unchanged     1. Hyponatremia   2. Hypokalemia   3. Altered mental status   4. Depression       MDM  79 year old female with possible syncope, more likely that she fell asleep secondary to medication side effect. Patient has hyponatremia at 129. This may be a side effect of her hydrochlorothiazide, possibly Latuda.  Family does not wish her to be admitted, patient wishes to go home. They want to followup with her primary care Dr. and her psychiatrist on Monday. She is not orthostatic, EKG is unremarkable. Will have her restrict fluids, stop hydrochlorothiazide and latuda, hold Seroquel, and return for worsening condition.        Olivia Mackie, MD 12/29/12 [phone]

## 2012-12-29 NOTE — ED Notes (Signed)
Patient is alert and oriented x3.  She was given DC instructions and follow up visit instructions.  Patient gave verbal understanding. She was DC ambulatory under her own power to home.  V/S stable.  He was not showing any signs of distress on DC 

## 2012-12-29 NOTE — ED Notes (Signed)
[id number]<AV> Expected date:<BR> Expected time:<BR> Means of arrival:<BR> Comments:<BR> Transfer from [facility]

## 2012-12-30 ENCOUNTER — Encounter: Payer: Self-pay | Admitting: Internal Medicine

## 2012-12-30 ENCOUNTER — Ambulatory Visit (INDEPENDENT_AMBULATORY_CARE_PROVIDER_SITE_OTHER): Payer: Medicare Other | Admitting: Internal Medicine

## 2012-12-30 ENCOUNTER — Ambulatory Visit (HOSPITAL_COMMUNITY)
Admission: RE | Admit: 2012-12-30 | Discharge: 2012-12-30 | Disposition: A | Discharge status: Home / Self Care | Payer: Medicare Other | Source: Ambulatory Visit | Attending: Internal Medicine | Admitting: Internal Medicine

## 2012-12-30 VITALS — BP 136/82 | HR 90 | Temp 97.7°F | Wt 141.0 lb

## 2012-12-30 DIAGNOSIS — R55 Syncope and collapse: Secondary | ICD-10-CM | POA: Insufficient documentation

## 2012-12-30 DIAGNOSIS — I10 Essential (primary) hypertension: Secondary | ICD-10-CM

## 2012-12-30 DIAGNOSIS — F341 Dysthymic disorder: Secondary | ICD-10-CM

## 2012-12-30 DIAGNOSIS — R32 Unspecified urinary incontinence: Secondary | ICD-10-CM | POA: Insufficient documentation

## 2012-12-30 DIAGNOSIS — F419 Anxiety disorder, unspecified: Secondary | ICD-10-CM

## 2012-12-30 LAB — CBC WITH DIFFERENTIAL/PLATELET
Basophils Relative: 0.6 % (ref 0.0–3.0)
Eosinophils Relative: 0.6 % (ref 0.0–5.0)
HCT: 34.4 % — ABNORMAL LOW (ref 36.0–46.0)
Hemoglobin: 11.6 g/dL — ABNORMAL LOW (ref 12.0–15.0)
Lymphs Abs: 1.2 10*3/uL (ref 0.7–4.0)
Monocytes Relative: 9.2 % (ref 3.0–12.0)
Platelets: 274 10*3/uL (ref 150.0–400.0)
RBC: 3.51 Mil/uL — ABNORMAL LOW (ref 3.87–5.11)
WBC: 6.3 10*3/uL (ref 4.5–10.5)

## 2012-12-30 LAB — BASIC METABOLIC PANEL
BUN: 21 mg/dL (ref 6–23)
Chloride: 100 mEq/L (ref 96–112)
GFR: 54.07 mL/min — ABNORMAL LOW (ref >60.00)
Potassium: 4.2 mEq/L (ref 3.5–5.1)
Sodium: 137 mEq/L (ref 135–145)

## 2012-12-30 MED ORDER — CLONAZEPAM 0.5 MG PO TABS
0.5000 mg | ORAL_TABLET | Freq: Three times a day (TID) | ORAL | Status: DC | PRN
Start: 2012-12-30 — End: 2013-04-01

## 2012-12-30 MED ORDER — MIRTAZAPINE 15 MG PO TABS: 15.0000 mg | ORAL_TABLET | Freq: Every day | ORAL | Status: DC

## 2012-12-30 NOTE — Patient Instructions (Addendum)
Clonazepam 0.5 mg one tablet 3 times a day as needed. You may consider take 1/2 tablet twice a day and one tablet at bedtime to avoid feeling too sleepy If worse, go to the ER Please come back in 10 days

## 2012-12-30 NOTE — Assessment & Plan Note (Addendum)
Recently dx w/ bipolar, intolerant to meds:  -- prescribed Lamictal 200 mg, Risperdal, latuda started 10-25-2012, felt twice while in that combo. effexor was tappered off. -- pt felt was no better so Risperdal  and latuda were d/c early February and lamictal dose decreased to 100 mg  -- on 12-25-2012 she was  back on latuda (lower dose) and started seroquel 50 mg for insomnia; remeron , klonopin were d/c  --since she left the ER 2 days ago, is only on lamictal 100 I spoke with Dr. Tomasa Rand, I am concerned about a restlessness and twitching. I wonder if that could be a side effect of the medications she was taking or withdrawal from  Clonazepam, he said all that is possible. He recommend something to help her sleep and offered to see her soon. Also advised not to stop  Lamictal abruptly The patient does not desire to go back to him, consequently we'll do the following: Go back on clonazepam Stay on Lamictal for now Go back to Remeron , low dose, as it worked very well for her for a long time. She already has a scheduled appointment next month to see Dr. Edd Fabian --- will call and ask for a sooner appointment ER if not better, followup in 10 days Labs to follow up on mild anemia, hypokalemia and hyponatremia

## 2012-12-30 NOTE — Progress Notes (Signed)
  Subjective:    Patient ID: [name] [name], female    DOB: [birth year], 79 y.o.   MRN: [id number]  HPI ER followup Micah Flesher to the ER 12/29/2011 after episodes of mental status changes, unclear to the ER staff if she actually lost consciousness or was simply very sleepy; today the daughter reports she did loss conciousness.  Symptoms have been in the setting of recently diagnosed with bipolar and: -- prescribed Lamictal 200 mg, Risperdal, latuda started 10-25-2012, felt twice while in that combo. effexor was tappered off.  -- pt felt was no better so Risperdal and latuda were d/c early February and lamictal dose decreased to 100 mg  -- on ~ 12-25-2012 she was back on latuda (lower dose) and started seroquel 50 mg for insomnia; remeron , klonopin were d/c  --since she left the ER 2 days ago, is only on lamictal 100  ER records reviewed: EKG was at baseline. Sodium was 129, potassium 3.1, hemoglobin 10.9, slightly lower than baseline. LFTs were normal. Was recommended to stop  latuda as they  felt it could be causing hyponatremia, also was prescribed potassium.  Past Medical History  Diagnosis Date  . Cardiomyopathy     had normal cath 2007 w/ EF 52%  . Hx of colonic polyps   . Hypertension   . Osteopenia   . Anemia   . Osteoarthritis   . Anxiety and depression 07/16/2009  . ASD (atrial septal defect)      per Bubble study 2004   Past Surgical History  Procedure Laterality Date  . Tonsillectomy    . Back surgery  2000    due to fall  . Breast surgery      negative Bx     Review of Systems Since she left the hospital she is extremely restless, shaky, feels like her muscles are twitching. Has not been able to sleep in 3 days according to the patient's daughter. On further questioning, at the time of his syncope the daughter reports that the patient was drooling and had bladder incontinence. There was no obvious seizure activity. There was no slurred speech, facial paralysis or  motor deficits.    Objective:   Physical Exam General -- alert, well-developed Lungs -- normal respiratory effort, no intercostal retractions, no accessory muscle use, and normal breath sounds.   Heart-- normal rate, regular rhythm, no murmur, and no gallop.   Extremities-- no pretibial edema bilaterally Neurologic-- The patient does seem restless, walks around the room constantly. She is however oriented x3, has a good recollection of what is going on. Face is symmetric, speech is fluent. Psych-- Cognition and judgment appear intact. Cooperative slightly decreased attention span ;+ anxious appearing but not depressed appearing.        Assessment & Plan:   Today , I spent more than 45 min with the patient, >50% of the time counseling, reviewing the chart and labs ordered by other providers, coordinating her care and discussing the plan with the patient and her daughter

## 2012-12-30 NOTE — Assessment & Plan Note (Signed)
Recent episode of syncope, reports no slurred speech or motor deficits. She did have some drooling and bladder incontinence. Plan: Brain MRI

## 2013-01-01 ENCOUNTER — Telehealth: Payer: Self-pay | Admitting: Internal Medicine

## 2013-01-01 MED ORDER — LAMOTRIGINE 25 MG PO TABS: 50.0000 mg | ORAL_TABLET | Freq: Every day | ORAL | Status: DC

## 2013-01-01 NOTE — Telephone Encounter (Signed)
Discussed with pt's daughter. Pt's daughter asked if there was anything else the pt could take that would help with her depression. Please advise.

## 2013-01-01 NOTE — Telephone Encounter (Signed)
Spoke with pt's daughter. She would like to know if there is a good way to wean the pt off of lamictal. I advised the pt's daughter I have contacted Dr. Caprice Renshaw office to see if her appt can be moved up any earlier, & i'm just waiting to hear back. Please advise.

## 2013-01-01 NOTE — Telephone Encounter (Signed)
lmovm to return call  °

## 2013-01-01 NOTE — Telephone Encounter (Signed)
Patient Information:  Caller Name: Vikki Ports  Phone: [phone]  Patient: [name] [name], [name] [name]  Gender: Female  DOB: 03-Mar-1927  Age: 79 Years  PCP: Willow Ora  Office Follow Up:  Does the office need to follow up with this patient?: Yes  Instructions For The Office: See note- not sleeping well; asking for MRI results and earlier psychiatry appt.  RN Note:  Weaning from [employer].  Poor concentration, mildly unsteady. Unable to go to [facility] or [facility] due to feeling poorly. Concerned may have missed call with MRI results since [name] is staying with her daughter.  Please call Vikki Ports at [phone] with MRI results and advise if appointment with psychiatrist can be moved up.  Symptoms  Reason For Call & Symptoms: Called regarding restlessness, not sleeping, very sad/depressed.  Asking for results of MRI done 12/30/12 and if psychiatry appointment can be moved up.  Reviewed Health History In EMR: Yes  Reviewed Medications In EMR: Yes  Reviewed Allergies In EMR: Yes  Reviewed Surgeries / Procedures: Yes  Date of Onset of Symptoms: 10/13/2012  Treatments Tried: Klonapin  Treatments Tried Worked: Yes  Guideline(s) Used:  Depression  Disposition Per Guideline:   Medical sales representative Today  Reason For Disposition Reached:   Symptoms persisting > 2 weeks  Advice Given:  Reassurance:   People with depression do get through this -- even people who feel as badly as you feel now. You can be helped.  Encourage the caller to talk about his/her problems and feelings.  Offer hope.  Call Back If:  Sadness or depression symptoms persist more than 2 weeks  You feel like harming yourself  You become worse.  RN Overrode Recommendation:  Follow Up With Office Later  Office to call back-see note

## 2013-01-01 NOTE — Telephone Encounter (Signed)
She is currently taking Lamictal 200 mg half tablet daily. Advise patient: Start Lamictal 25 mg: 2 tablets daily for 10 days, then one tablet daily. Stay on  one tablet daily until she sees psychiatry. prescription already sent.

## 2013-01-02 NOTE — Telephone Encounter (Signed)
Discussed with pt's daughter.  Pt was able to see Dr. Donell Beers today.

## 2013-01-02 NOTE — Telephone Encounter (Signed)
She is back on Remeron, I anticipate she will improve gradually.

## 2013-01-06 ENCOUNTER — Encounter: Payer: Self-pay | Admitting: Lab

## 2013-01-07 ENCOUNTER — Ambulatory Visit: Payer: Medicare Other | Admitting: Internal Medicine

## 2013-01-08 ENCOUNTER — Encounter: Payer: Medicare Other | Admitting: Internal Medicine

## 2013-01-09 ENCOUNTER — Telehealth: Payer: Self-pay | Admitting: *Deleted

## 2013-01-09 NOTE — Telephone Encounter (Signed)
Pt was previously on 30 mg of REMERON but recent Rx was sent in for 15 mg please clarify dose. Per OV 12-30-12   Go back to Remeron , low dose, as it worked very well for her for a long time

## 2013-01-10 NOTE — Telephone Encounter (Signed)
Left message to call office to advise Pt of med change. Spoke with pharmacy and made them aware as well. Pt already picked up Rx.

## 2013-01-10 NOTE — Telephone Encounter (Signed)
I recommended remeron 15 mg , no change unless her psychiatrist likes to do something different

## 2013-01-13 ENCOUNTER — Other Ambulatory Visit: Payer: Self-pay | Admitting: Internal Medicine

## 2013-01-13 NOTE — Telephone Encounter (Signed)
Refill done.  

## 2013-01-29 ENCOUNTER — Other Ambulatory Visit: Payer: Self-pay | Admitting: Internal Medicine

## 2013-01-29 NOTE — Telephone Encounter (Signed)
Refill done.  

## 2013-02-02 ENCOUNTER — Other Ambulatory Visit: Payer: Self-pay | Admitting: Internal Medicine

## 2013-02-03 NOTE — Telephone Encounter (Signed)
Refill done.  

## 2013-02-10 ENCOUNTER — Encounter: Payer: Self-pay | Admitting: Internal Medicine

## 2013-02-10 ENCOUNTER — Encounter: Payer: Self-pay | Admitting: Lab

## 2013-02-10 ENCOUNTER — Ambulatory Visit (INDEPENDENT_AMBULATORY_CARE_PROVIDER_SITE_OTHER): Payer: Medicare Other | Admitting: Internal Medicine

## 2013-02-10 VITALS — BP 136/84 | HR 70 | Temp 97.7°F | Wt 141.0 lb

## 2013-02-10 DIAGNOSIS — R739 Hyperglycemia, unspecified: Secondary | ICD-10-CM | POA: Insufficient documentation

## 2013-02-10 DIAGNOSIS — M26629 Arthralgia of temporomandibular joint, unspecified side: Secondary | ICD-10-CM | POA: Insufficient documentation

## 2013-02-10 DIAGNOSIS — R7303 Prediabetes: Secondary | ICD-10-CM

## 2013-02-10 DIAGNOSIS — R7309 Other abnormal glucose: Secondary | ICD-10-CM

## 2013-02-10 DIAGNOSIS — I10 Essential (primary) hypertension: Secondary | ICD-10-CM

## 2013-02-10 LAB — POTASSIUM: Potassium: 3.6 mEq/L (ref 3.5–5.1)

## 2013-02-10 LAB — HEMOGLOBIN A1C: Hgb A1c MFr Bld: 6 % (ref 4.6–6.5)

## 2013-02-10 NOTE — Assessment & Plan Note (Signed)
Patient presents with TMJ pain, review of systems does not point to  temporal arteritis. Plan: see instructions, I think she needs to see her dentist, she actually has an appointment in few days.

## 2013-02-10 NOTE — Patient Instructions (Signed)
Tylenol  500 mg OTC 2 tabs a day every 8 hours as needed for pain For the next 2 weeks, eat a soft diet, not stakes or any thing that requires a lot of chewing. No big bites Ice 2 or 3 times a day Please discuss this problem week your dentist as well. If not better, let me know     Temporomandibular Problems  Temporomandibular joint (TMJ) dysfunction means there are problems with the joint between your jaw and your skull. This is a joint lined by cartilage like other joints in your body but also has a small disc in the joint which keeps the bones from rubbing on each other. These joints are like other joints and can get inflamed (sore) from arthritis and other problems. When this joint gets sore, it can cause headaches and pain in the jaw and the face. CAUSES  Usually the arthritic types of problems are caused by soreness in the joint. Soreness in the joint can also be caused by overuse. This may come from grinding your teeth. It may also come from mis-alignment in the joint. DIAGNOSIS Diagnosis of this condition can often be made by history and exam. Sometimes your caregiver may need X-rays or an MRI scan to determine the exact cause. It may be necessary to see your dentist to determine if your teeth and jaws are lined up correctly. TREATMENT  Most of the time this problem is not serious; however, sometimes it can persist (become chronic). When this happens medications that will cut down on inflammation (soreness) help. Sometimes a shot of cortisone into the joint will be helpful. If your teeth are not aligned it may help for your dentist to make a splint for your mouth that can help this problem. If no physical problems can be found, the problem may come from tension. If tension is found to be the cause, biofeedback or relaxation techniques may be helpful. HOME CARE INSTRUCTIONS   Later in the day, applications of ice packs may be helpful. Ice can be used in a plastic bag with a towel around it  to prevent frostbite to skin. This may be used about every 2 hours for 20 to 30 minutes, as needed while awake, or as directed by your caregiver.  Only take over-the-counter or prescription medicines for pain, discomfort, or fever as directed by your caregiver.  If physical therapy was prescribed, follow your caregiver's directions.  Wear mouth appliances as directed if they were given. Document Released: 07/25/2001 Document Revised: 01/22/2012 Document Reviewed: 11/01/2008 [facility] Patient Information 2013 [place], [place].

## 2013-02-10 NOTE — Assessment & Plan Note (Signed)
Had a A1C few months ago ----> 6.3  Has a healthy life style, redo labs

## 2013-02-10 NOTE — Assessment & Plan Note (Signed)
Since the last time she was here, she is not on K+: labs

## 2013-02-10 NOTE — Progress Notes (Signed)
  Subjective:    Patient ID: [name] [name], female    DOB: 28-Aug-1927, 79 y.o.   MRN: [id number]  HPI Acute visit Left TMJ pain for the last 4 or 5 days, symptoms were not related to any particular episode, has not changed her diet recently, denies swelling, pain increased by chewing. Also, she has prediabetes, needs a A1c. Hypertension, since the last time she was here, she stopped taking potassium, BP today is very good. Needs a potassium recheck.  Past Medical History  Diagnosis Date  . Cardiomyopathy     had normal cath 2007 w/ EF 52%  . Hx of colonic polyps   . Hypertension   . Osteopenia   . Anemia   . Osteoarthritis   . Anxiety and depression 07/16/2009  . ASD (atrial septal defect)      per Bubble study 2004   Past Surgical History  Procedure Laterality Date  . Tonsillectomy    . Back surgery  2000    due to fall  . Breast surgery      negative Bx     Review of Systems Anxiety, medication list is updated, now under the care of Dr.Plovsky, reports she is better but not 100% back to baseline. Denies fever chills, no headaches or weight loss. No dental pain, no recent dental work.     Objective:   Physical Exam  General -- alert, well-developed, no apparent distress.  Neck --no thyromegaly  HEENT --  TMJs without click, slightly tender to palpation at the left TMJ but without swelling or any deformity. Right ear obscured by wax. Left year, some wax, TM seems normal. Percussion of her teeth is normal. Oral cavity with no evidence of an abscess on inspection. psych-- she certainly looks better than the last time I saw her, no obvious anxiety or depression noted. She is not restless, is coherent, cooperative.     Assessment & Plan:

## 2013-02-13 ENCOUNTER — Encounter: Payer: Self-pay | Admitting: *Deleted

## 2013-03-04 ENCOUNTER — Other Ambulatory Visit: Payer: Self-pay | Admitting: Internal Medicine

## 2013-03-04 NOTE — Telephone Encounter (Signed)
Refill done.  

## 2013-03-07 ENCOUNTER — Ambulatory Visit (INDEPENDENT_AMBULATORY_CARE_PROVIDER_SITE_OTHER)
Admission: RE | Admit: 2013-03-07 | Discharge: 2013-03-07 | Disposition: A | Discharge status: Home / Self Care | Payer: Medicare Other | Source: Ambulatory Visit | Attending: Internal Medicine | Admitting: Internal Medicine

## 2013-03-07 ENCOUNTER — Encounter: Payer: Self-pay | Admitting: Internal Medicine

## 2013-03-07 ENCOUNTER — Ambulatory Visit (INDEPENDENT_AMBULATORY_CARE_PROVIDER_SITE_OTHER): Payer: Medicare Other | Admitting: Internal Medicine

## 2013-03-07 VITALS — BP 132/78 | HR 63 | Temp 97.6°F | Ht 63.5 in | Wt 136.0 lb

## 2013-03-07 DIAGNOSIS — S99922A Unspecified injury of left foot, initial encounter: Secondary | ICD-10-CM

## 2013-03-07 DIAGNOSIS — R269 Unspecified abnormalities of gait and mobility: Secondary | ICD-10-CM

## 2013-03-07 DIAGNOSIS — M899 Disorder of bone, unspecified: Secondary | ICD-10-CM

## 2013-03-07 DIAGNOSIS — Z0000 Encounter for general adult medical examination without abnormal findings: Secondary | ICD-10-CM

## 2013-03-07 DIAGNOSIS — S99929A Unspecified injury of unspecified foot, initial encounter: Secondary | ICD-10-CM

## 2013-03-07 DIAGNOSIS — I10 Essential (primary) hypertension: Secondary | ICD-10-CM

## 2013-03-07 DIAGNOSIS — F341 Dysthymic disorder: Secondary | ICD-10-CM

## 2013-03-07 DIAGNOSIS — I428 Other cardiomyopathies: Secondary | ICD-10-CM

## 2013-03-07 DIAGNOSIS — F329 Major depressive disorder, single episode, unspecified: Secondary | ICD-10-CM

## 2013-03-07 DIAGNOSIS — M949 Disorder of cartilage, unspecified: Secondary | ICD-10-CM

## 2013-03-07 DIAGNOSIS — M26629 Arthralgia of temporomandibular joint, unspecified side: Secondary | ICD-10-CM

## 2013-03-07 LAB — BASIC METABOLIC PANEL
BUN: 18 mg/dL (ref 6–23)
CO2: 26 mEq/L (ref 19–32)
Chloride: 96 mEq/L (ref 96–112)
Potassium: 4 mEq/L (ref 3.5–5.1)

## 2013-03-07 NOTE — Assessment & Plan Note (Signed)
See history of present illness, she is prone to falls. Recently injure her left great toe. & Plan: PT referral. Encourage is use of a cane. X-ray of the great toe.

## 2013-03-07 NOTE — Assessment & Plan Note (Signed)
Resolved

## 2013-03-07 NOTE — Assessment & Plan Note (Signed)
Well-controlled at present, check a BMP

## 2013-03-07 NOTE — Assessment & Plan Note (Addendum)
CHART REVIEWED  Td 2-08, shingles shot 2009, pneumonia shot 1-08   Has yearly MMGs, Last month 10/2012 negative  PAP 2-08 and 9-11 (-)---> previously we decided not to do any further Paps    s/p Cscope Dr Randa Evens 2006, and 10- 2011, no further scopes, iFOB in 5 years? Diet and exercise discussed

## 2013-03-07 NOTE — Assessment & Plan Note (Addendum)
Patient is concerned about her family history of heart disease, at some point was diagnosed with nonischemic cardiomyopathy. Last EKG in the ER few weeks ago was normal sinus rhythm. She is asymptomatic. Also, recent FLP show a LDL of 116, will repeat in few months. Plan: Repeat echocardiogram.

## 2013-03-07 NOTE — Progress Notes (Signed)
Subjective:    Patient ID: [name] [name], female    DOB: 1927/03/09, 79 y.o.   MRN: [id number]  HPI  Here for Medicare AWV: 1.         Risk factors based on Past M, S, F history: reviewed   2.         Physical Activities: [facility] to the "[facility]" 2/[facility] , had to put on hold witting a book 3.         Depression/mood: (+) screening, see below    4.         Hearing: no problems reported or noted   5.         ADL's: totally independent, still drives   6.         Fall Risk: had a mechanical fall 2 days ago: tripped w/ her L great toe in a chair, fell, hurt her R              arm; plan: see instructions offered PT referral ----> will    arrange , also rec a cane  7.         Home Safety: does feel safe ar home   8.         Height, weight, &visual acuity: see VS,  vision well corrected  w/ glasses   9.         Counseling: yes, see a/p   10.       Labs ordered based on risk factors: yes   11.       Referral Coordination: yes , see below   12.       Care Plan:see a/p   13.       Cognitive Assessment: decreased from previous year, due to depression, lack of concentration; although she still drives and is independent for the most part, had to move w/ her daughter temporarily.  in addition we discussed the following issues Depression-- good medication compliance, over all she feels has improved although her PHQ 9 is scored at 18 today. Anxiety according to the patient has definitely improved, not suicidal. Ambulatory BPs around 116, 137. Good medication compliance. Constipation, well-controlled with OTCs. Patient is concerned about her family history of heart disease wonders if she should see a cardiologist.History of cardiomyopathy per chart review.  Past Medical History  Diagnosis Date  . Cardiomyopathy     had normal cath 2007 w/ EF 52%  . Hx of colonic polyps   . Hypertension   . Osteopenia   . Anemia   . Osteoarthritis   . Anxiety and depression 07/16/2009  . ASD (atrial septal defect)     per Bubble study 2004   Past Surgical History  Procedure Laterality Date  . Tonsillectomy    . Back surgery  2000    due to fall  . Breast surgery      negative Bx   History   Social History  . Marital Status: Widowed    Spouse Name: N/A    Number of Children: 1  . Years of Education: N/A   Occupational History  . Motivational Speaker    Social History Main Topics  . Smoking status: Never Smoker   . Smokeless tobacco: Never Used  . Alcohol Use: No  . Drug Use: No  . Sexually Active: Not on file   Other Topics Concern  . Not on file   Social History Narrative   Widow, moved to [facility] 2007.   Has a daughter Vikki Ports  and a G-d in [place]; lost her son-in-law   Used to live by herself,  [place] Ham w/ her daughter ~ 11-2012 due to depression, lack of concentration  although she still drives and is independent for the most partVolunteers at church          Family History  Problem Relation Age of Onset  . Diabetes Neg Hx   . Heart attack Father     F and 2 brothers (no early onset)  . Cancer Neg Hx     colon or breast  . Hypertension Other      Review of Systems Denies chest pain or shortness or breath No nausea, vomiting, diarrhea or blood in the stools. No dysuria or gross nocturia. Denies pain in the neck, right shoulder or right elbow. Still has some pain but very mild at the left great toe, improved compared to 2 days ago.    Objective:   Physical Exam BP 132/78  Pulse 63  Temp(Src) 97.6 F (36.4 C) (Oral)  Ht 5' 3.5" (1.613 m)  Wt 136 lb (61.689 kg)  BMI 23.71 kg/m2  SpO2 98%  General -- alert, well-developed, No apparent emotional or physical distress. Neck --no thyromegaly Lungs -- normal respiratory effort, no intercostal retractions, no accessory muscle use, and normal breath sounds.   Heart-- normal rate, regular rhythm, no murmur, and no gallop.   Abdomen--soft, non-tender, no distention, no masses, no HSM, no guarding, and no rigidity.   Extremities--  no pretibial edema bilaterally; Left great toe with a echymossis, slightly tender at the PIP, no deformities Neurologic--No formal neuropsychiatry exam is performed today except for a PHQ 9 however she is alert, oriented, answered all questions appropriately; no anxious appearing ; I can tell she does not seem is cheerful as  in previous years     Assessment & Plan:

## 2013-03-07 NOTE — Patient Instructions (Addendum)
Please get your x-ray at the other [place]  office located at: [address] [place], across from [facility].  Please go to the basement, this is a walk-in facility, they are open from 8:30 to 5:30 PM. Phone number [phone].    Fall Prevention and Home Safety Falls cause injuries and can affect all age groups. It is possible to use preventive measures to significantly decrease the likelihood of falls. There are many simple measures which can make your home safer and prevent falls. OUTDOORS  Repair cracks and edges of walkways and driveways.  Remove high doorway thresholds.  Trim shrubbery on the main path into your home.  Have good outside lighting.  Clear walkways of tools, rocks, debris, and clutter.  Check that handrails are not broken and are securely fastened. Both sides of steps should have handrails.  Have leaves, snow, and ice cleared regularly.  Use sand or salt on walkways during winter months.  In the garage, clean up grease or oil spills. BATHROOM  Install night lights.  Install grab bars by the toilet and in the tub and shower.  Use non-skid mats or decals in the tub or shower.  Place a plastic non-slip stool in the shower to sit on, if needed.  Keep floors dry and clean up all water on the floor immediately.  Remove soap buildup in the tub or shower on a regular basis.  Secure bath mats with non-slip, double-sided rug tape.  Remove throw rugs and tripping hazards from the floors. BEDROOMS  Install night lights.  Make sure a bedside light is easy to reach.  Do not use oversized bedding.  Keep a telephone by your bedside.  Have a firm chair with side arms to use for getting dressed.  Remove throw rugs and tripping hazards from the floor. KITCHEN  Keep handles on pots and pans turned toward the center of the stove. Use back burners when possible.  Clean up spills quickly and allow time for drying.  Avoid walking on wet  floors.  Avoid hot utensils and knives.  Position shelves so they are not too high or low.  Place commonly used objects within easy reach.  If necessary, use a sturdy step stool with a grab bar when reaching.  Keep electrical cables out of the way.  Do not use floor polish or wax that makes floors slippery. If you must use wax, use non-skid floor wax.  Remove throw rugs and tripping hazards from the floor. STAIRWAYS  Never leave objects on stairs.  Place handrails on both sides of stairways and use them. Fix any loose handrails. Make sure handrails on both sides of the stairways are as long as the stairs.  Check carpeting to make sure it is firmly attached along stairs. Make repairs to worn or loose carpet promptly.  Avoid placing throw rugs at the top or bottom of stairways, or properly secure the rug with carpet tape to prevent slippage. Get rid of throw rugs, if possible.  Have an electrician put in a light switch at the top and bottom of the stairs. OTHER FALL PREVENTION TIPS  Wear low-heel or rubber-soled shoes that are supportive and fit well. Wear closed toe shoes.  When using a stepladder, make sure it is fully opened and both spreaders are firmly locked. Do not climb a closed stepladder.  Add color or contrast paint or tape to grab bars and handrails in your home. Place contrasting color strips on first and last steps.  Learn and use mobility aids as needed. Install an electrical emergency response system.  Turn on lights to avoid dark areas. Replace light bulbs that burn out immediately. Get light switches that glow.  Arrange furniture to create clear pathways. Keep furniture in the same place.  Firmly attach carpet with non-skid or double-sided tape.  Eliminate uneven floor surfaces.  Select a carpet pattern that does not visually hide the edge of steps.  Be aware of all pets. OTHER HOME SAFETY TIPS  Set the water temperature for 120 F (48.8 C).  Keep  emergency numbers on or near the telephone.  Keep smoke detectors on every level of the home and near sleeping areas. Document Released: 10/20/2002 Document Revised: 04/30/2012 Document Reviewed: 01/19/2012 [facility], [place] Patient Information 2013 [place], [place].

## 2013-03-07 NOTE — Assessment & Plan Note (Signed)
Has the diagnosis of osteopenia but a bone density test  06-2010 negative  plan:repeat DEXA

## 2013-03-07 NOTE — Assessment & Plan Note (Signed)
Under the care of Dr. Clair Gulling

## 2013-03-11 ENCOUNTER — Telehealth: Payer: Self-pay | Admitting: Internal Medicine

## 2013-03-11 NOTE — Telephone Encounter (Signed)
Patient made an appt for 04/01/13 to fu on low sodium. She wanted me to check to make sure that she didn't need to be seen earlier.

## 2013-03-11 NOTE — Telephone Encounter (Signed)
04-01-13 is ok

## 2013-03-12 NOTE — Telephone Encounter (Signed)
Left detailed msg on pt's vmail.  

## 2013-03-13 ENCOUNTER — Encounter: Payer: Self-pay | Admitting: Internal Medicine

## 2013-03-21 ENCOUNTER — Ambulatory Visit (HOSPITAL_COMMUNITY): Payer: Medicare Other | Attending: Cardiology | Admitting: Radiology

## 2013-03-21 DIAGNOSIS — I428 Other cardiomyopathies: Secondary | ICD-10-CM | POA: Insufficient documentation

## 2013-03-21 DIAGNOSIS — I10 Essential (primary) hypertension: Secondary | ICD-10-CM | POA: Insufficient documentation

## 2013-03-21 NOTE — Progress Notes (Signed)
Echocardiogram performed.  

## 2013-03-24 ENCOUNTER — Ambulatory Visit: Payer: Medicare Other | Attending: Internal Medicine

## 2013-03-24 DIAGNOSIS — M6281 Muscle weakness (generalized): Secondary | ICD-10-CM | POA: Insufficient documentation

## 2013-03-24 DIAGNOSIS — IMO0001 Reserved for inherently not codable concepts without codable children: Secondary | ICD-10-CM | POA: Insufficient documentation

## 2013-03-24 DIAGNOSIS — R269 Unspecified abnormalities of gait and mobility: Secondary | ICD-10-CM | POA: Insufficient documentation

## 2013-03-26 ENCOUNTER — Ambulatory Visit (INDEPENDENT_AMBULATORY_CARE_PROVIDER_SITE_OTHER)
Admission: RE | Admit: 2013-03-26 | Discharge: 2013-03-26 | Disposition: A | Discharge status: Home / Self Care | Payer: Medicare Other | Source: Ambulatory Visit | Attending: Internal Medicine | Admitting: Internal Medicine

## 2013-03-26 ENCOUNTER — Telehealth: Payer: Self-pay | Admitting: Cardiovascular Disease

## 2013-03-26 ENCOUNTER — Telehealth: Payer: Self-pay | Admitting: *Deleted

## 2013-03-26 ENCOUNTER — Encounter: Payer: Self-pay | Admitting: *Deleted

## 2013-03-26 DIAGNOSIS — M949 Disorder of cartilage, unspecified: Secondary | ICD-10-CM

## 2013-03-26 DIAGNOSIS — M899 Disorder of bone, unspecified: Secondary | ICD-10-CM

## 2013-03-26 NOTE — Telephone Encounter (Signed)
Follow-up: ° ° ° °Patient called in returning your call.  Please call back. °

## 2013-03-26 NOTE — Telephone Encounter (Signed)
         [name] [name] ','<More Detail >>       [name] [name]     MRN: [id number] DOB: 09-06-1927     Pt Work: [phone] Pt Home: [phone]   Sent: Caleen Essex Mar 21, 2013 12:22 PM     To: Alois Cliche, LPN                          Message    Patient is a patient of Dr. Eden Emms. She was seen in 1/ 2012 with a comment in the office note to be seen in one year. Patient said she was never called for a follow up. Dr. Drue Novel ordered an echo just as part of routine physical and patient would like a follow up appt with Nishan. Thanks                  LEFT MESSAGE TO CALL BACK NEED TO SCHEDULE AN APPT  IS OVER DUE .Zack Seal

## 2013-03-26 NOTE — Telephone Encounter (Signed)
APPT MADE FOR  05-13-13 AT 11:15 AM WITH DR Eden Emms PT AWARE .Zack Seal

## 2013-03-27 NOTE — Telephone Encounter (Signed)
PT HAS APPT  ON 05-13-13 AT  11:15 AM  WITH DR NISHAN./CY

## 2013-03-31 ENCOUNTER — Ambulatory Visit: Payer: Medicare Other | Admitting: Physical Therapy

## 2013-04-01 ENCOUNTER — Ambulatory Visit (INDEPENDENT_AMBULATORY_CARE_PROVIDER_SITE_OTHER): Payer: Medicare Other | Admitting: Internal Medicine

## 2013-04-01 VITALS — BP 114/76 | HR 66 | Temp 97.9°F | Wt 136.0 lb

## 2013-04-01 DIAGNOSIS — F341 Dysthymic disorder: Secondary | ICD-10-CM

## 2013-04-01 DIAGNOSIS — S8290XS Unspecified fracture of unspecified lower leg, sequela: Secondary | ICD-10-CM

## 2013-04-01 DIAGNOSIS — I10 Essential (primary) hypertension: Secondary | ICD-10-CM

## 2013-04-01 DIAGNOSIS — S92911S Unspecified fracture of right toe(s), sequela: Secondary | ICD-10-CM

## 2013-04-01 DIAGNOSIS — M949 Disorder of cartilage, unspecified: Secondary | ICD-10-CM

## 2013-04-01 DIAGNOSIS — F329 Major depressive disorder, single episode, unspecified: Secondary | ICD-10-CM

## 2013-04-01 NOTE — Patient Instructions (Addendum)
Take calcium 1 gram and vit D around 600 units daily

## 2013-04-01 NOTE — Progress Notes (Signed)
  Subjective:    Patient ID: [name] [name], female    DOB: 11/21/1926, 79 y.o.   MRN: [id number]  HPI Here with her daughter, like to discuss several issues: Anxiety, transitioning from Effexor to Wellbutrin, per psychiatry. Hyponatremia, needs a BMP Osteopenia, we discussed his recent bone density test. She is concerned about a change in the color of her nails. Had a great toe fracture, wonders if she can stop the banding. Constipation,   back to MiraLax? It works better than Metamucil and a stool softer.  Past Medical History  Diagnosis Date  . Cardiomyopathy     had normal cath 2007 w/ EF 52%  . Hx of colonic polyps   . Hypertension   . Osteopenia   . Anemia   . Osteoarthritis   . Anxiety and depression 07/16/2009  . ASD (atrial septal defect)      per Bubble study 2004   Past Surgical History  Procedure Laterality Date  . Tonsillectomy    . Back surgery  2000    due to fall  . Breast surgery      negative Bx     Review of Systems     Objective:   Physical Exam General -- alert, well-developed, NAD   Extremities-- no pretibial edema bilaterally; left great toe without deformities, no swelling, range of motion normal, not tender to palpation. Mild dystrophic nails at right first finger & left third finger; no onychomycosis, no dark lesions or subungeal spots Neurologic-- alert & oriented X3 and strength normal in all extremities. Psych-- Cognition and judgment appear intact. Alert and cooperative with normal attention span and concentration.  not anxious appearing and not depressed appearing.       Assessment & Plan:    Right great toe fracture, doing well, no need to abounding anymore. Dystrophic nails, recommend observation. Constipation, okay to switch to MiraLax, okay to take daily if needed.  Today , I spent more than 15 min with the patient, >50% of the time counseling and discussing a totla of 6 issues

## 2013-04-02 ENCOUNTER — Encounter: Payer: Self-pay | Admitting: Internal Medicine

## 2013-04-02 ENCOUNTER — Telehealth: Payer: Self-pay | Admitting: Internal Medicine

## 2013-04-02 ENCOUNTER — Other Ambulatory Visit: Payer: Medicare Other

## 2013-04-02 NOTE — Assessment & Plan Note (Signed)
  Hypertension, recently noted to have hyponatremia, may be medication related, we'll recheck.

## 2013-04-02 NOTE — Telephone Encounter (Signed)
Patient calling, states she was walking too fast today, and she fell in her home.  Patient states she landed on her left side.  She is having pain down her left side & hip.  Patient is requesting that Dr. Drue Novel place order for her to have xrays at [facility].  Patient was just seen by Dr. Drue Novel, 04/01/13.  Please advise.

## 2013-04-02 NOTE — Assessment & Plan Note (Signed)
Osteopenia, recommend to stay active, take off some of vitamin D.

## 2013-04-02 NOTE — Assessment & Plan Note (Signed)
Anxiety, change him from Effexor to Wellbutrin, feels better, followup by psychiatry.

## 2013-04-02 NOTE — Addendum Note (Signed)
Addended by: Silvio Pate D on: 04/02/2013 04:06 PM   Modules accepted: Orders

## 2013-04-02 NOTE — Telephone Encounter (Signed)
Pt already made a aware that she will need to be seen in UC or ED for evaluation. Pt was advised that due to fall without a OV it is impossible for [place] to evaluate and order the correct x-ray to include all her possible injury therefore she should be seen in ED/UC were she can be evaluated and treated at the same time. Pt advised to go to a UC which is preferred by Dr Drue Novel Pt ok will go to [facility] for evaluation. Marland Kitchen

## 2013-04-03 ENCOUNTER — Encounter: Payer: Self-pay | Admitting: *Deleted

## 2013-04-03 LAB — BASIC METABOLIC PANEL
BUN: 22 mg/dL (ref 6–23)
CO2: 25 mEq/L (ref 19–32)
Glucose, Bld: 90 mg/dL (ref 70–99)
Potassium: 3.8 mEq/L (ref 3.5–5.1)
Sodium: 131 mEq/L — ABNORMAL LOW (ref 135–145)

## 2013-04-09 ENCOUNTER — Ambulatory Visit: Payer: Medicare Other | Admitting: Physical Therapy

## 2013-04-14 ENCOUNTER — Ambulatory Visit: Payer: Medicare Other | Attending: Internal Medicine | Admitting: Physical Therapy

## 2013-04-14 DIAGNOSIS — R269 Unspecified abnormalities of gait and mobility: Secondary | ICD-10-CM | POA: Insufficient documentation

## 2013-04-14 DIAGNOSIS — IMO0001 Reserved for inherently not codable concepts without codable children: Secondary | ICD-10-CM | POA: Insufficient documentation

## 2013-04-14 DIAGNOSIS — M6281 Muscle weakness (generalized): Secondary | ICD-10-CM | POA: Insufficient documentation

## 2013-04-17 ENCOUNTER — Ambulatory Visit (INDEPENDENT_AMBULATORY_CARE_PROVIDER_SITE_OTHER): Payer: Medicare Other | Admitting: Internal Medicine

## 2013-04-17 VITALS — BP 128/76 | HR 74 | Temp 98.2°F | Wt 135.0 lb

## 2013-04-17 DIAGNOSIS — M1990 Unspecified osteoarthritis, unspecified site: Secondary | ICD-10-CM
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
## 2013-04-18 ENCOUNTER — Encounter: Payer: Self-pay | Admitting: Internal Medicine
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
## 2013-04-21 ENCOUNTER — Ambulatory Visit: Payer: Medicare Other | Admitting: Physical Therapy

## 2013-04-23 ENCOUNTER — Encounter: Payer: Self-pay | Admitting: Internal Medicine

## 2013-04-25 ENCOUNTER — Other Ambulatory Visit: Payer: Self-pay | Admitting: Internal Medicine

## 2013-04-25 NOTE — Telephone Encounter (Signed)
Refill done.  

## 2013-04-28 ENCOUNTER — Ambulatory Visit: Payer: Medicare Other

## 2013-04-28 ENCOUNTER — Encounter: Payer: Self-pay | Admitting: Internal Medicine

## 2013-05-13 ENCOUNTER — Ambulatory Visit: Payer: Medicare Other | Admitting: Cardiovascular Disease

## 2013-05-13 ENCOUNTER — Encounter: Payer: Self-pay | Admitting: Nurse Practitioner

## 2013-05-13 ENCOUNTER — Ambulatory Visit (INDEPENDENT_AMBULATORY_CARE_PROVIDER_SITE_OTHER): Payer: Medicare Other | Admitting: Nurse Practitioner

## 2013-05-13 VITALS — BP 128/84 | HR 76 | Ht 65.0 in | Wt 136.4 lb

## 2013-05-13 DIAGNOSIS — I5022 Chronic systolic (congestive) heart failure: Secondary | ICD-10-CM

## 2013-05-13 NOTE — Progress Notes (Signed)
[name] [name] Date of Birth: Nov 22, 1926 Medical Record #[id number]  History of Present Illness: Ms. [name] [name] is seen back today for a follow up visit. Seen for Dr. Eden Emms. Has not been seen here since January OF 2011. She has a history of DCM, presumed to be nonischemic with normal cath back in 2007. Other issues include asymptomatic PVCs, restless legs, HTN, anemia, depression and anxiety. Apparently with a remote positive bubble study for ASD.   Has had recent echo by PCP - EF has normalized.   Comes in today. Here alone. She is here alone. Admits that she is quite forgetful. Feels ok. Not having chest pain. No shortness of breath. Not dizzy or lightheaded. Tolerates her medicines. Has been on chronic ACE and ARB. Now living with her daughter. Admits that she has been significantly depressed - has lost weight. Trying to stay active.   Current Outpatient Prescriptions  Medication Sig Dispense Refill  . aspirin 325 MG EC tablet Take 325 mg by mouth every evening.      Marland Kitchen buPROPion (WELLBUTRIN XL) 150 MG 24 hr tablet Take 450 mg by mouth every morning.       . carvedilol (COREG) 12.5 MG tablet TAKE ONE TABLET BY MOUTH TWICE DAILY  60 tablet  6  . clonazePAM (KLONOPIN) 0.5 MG tablet Take 1.5 mg by mouth at bedtime.      Marland Kitchen lisinopril-hydrochlorothiazide (PRINZIDE,ZESTORETIC) 20-12.5 MG per tablet TAKE TWO TABLETS BY MOUTH DAILY  60 tablet  5  . losartan (COZAAR) 100 MG tablet TAKE ONE TABLET BY MOUTH ONE TIME DAILY  90 tablet  2  . mirtazapine (REMERON) 30 MG tablet Take 30 mg by mouth at bedtime.       . Multiple Vitamin (MULTIVITAMIN) tablet Take 1 tablet by mouth every morning.       . polyethylene glycol (MIRALAX / GLYCOLAX) packet Take 17 g by mouth 3 (three) times a week. Mon, wed, and fri       No current facility-administered medications for this visit.    Allergies  Allergen Reactions  . Shellfish Allergy Anaphylaxis  . Codeine   . Hydrocodone   . Adhesive (Tape) Rash      Past Medical History  Diagnosis Date  . Cardiomyopathy     had normal cath 2007 w/ EF 52%; normal EF per echo 04/2013  . Hx of colonic polyps   . Hypertension   . Osteopenia   . Anemia   . Osteoarthritis   . Anxiety and depression 07/16/2009  . ASD (atrial septal defect)      per Bubble study 2004    Past Surgical History  Procedure Laterality Date  . Tonsillectomy    . Back surgery  2000    due to fall  . Breast surgery      negative Bx    History  Smoking status  . Never Smoker   Smokeless tobacco  . Never Used    History  Alcohol Use No    Family History  Problem Relation Age of Onset  . Diabetes Neg Hx   . Cancer Neg Hx     colon or breast  . Heart attack Father     F and 2 brothers (no early onset)  . Hypertension Other   . Heart disease Mother     Review of Systems: The review of systems is per the HPI.  All other systems were reviewed and are negative.  Physical Exam: BP 128/84  Pulse 76  Ht 5\' 5"  (1.651 m)  Wt 136 lb 6.4 oz (61.871 kg)  BMI 22.7 kg/m2 Patient is very pleasant and in no acute distress. Weight is down 14 pounds. Looks younger than her stated age. Skin is warm and dry. Color is normal.  HEENT is unremarkable. Normocephalic/atraumatic. PERRL. Sclera are nonicteric. Neck is supple. No masses. No JVD. Lungs are clear. Cardiac exam shows a regular rate and rhythm. Abdomen is soft. Extremities are without edema. Gait and ROM are intact. No gross neurologic deficits noted.  LABORATORY DATA: Lab Results  Component Value Date   WBC 6.3 12/30/2012   HGB 11.6* 12/30/2012   HCT 34.4* 12/30/2012   PLT 274.0 12/30/2012   GLUCOSE 90 04/02/2013   CHOL 209* 11/08/2012   TRIG 50.0 11/08/2012   HDL 81.50 11/08/2012   LDLDIRECT 116.5 11/08/2012   LDLCALC 117* 08/12/2009   ALT 9 12/28/2012   AST 18 12/28/2012   NA 131* 04/02/2013   K 3.8 04/02/2013   CL 97 04/02/2013   CREATININE 1.3* 04/02/2013   BUN 22 04/02/2013   CO2 25 04/02/2013   TSH 1.453  10/09/2012   HGBA1C 6.0 02/10/2013   Echo Study Conclusions  - Left ventricle: Inferior hypokinesis. The cavity size was normal. Wall thickness was normal. Systolic function was normal. The estimated ejection fraction was in the range of 50% to 55%. Doppler parameters are consistent with abnormal left ventricular relaxation (grade 1 diastolic dysfunction). Doppler parameters are consistent with high ventricular filling pressure. - Right ventricle: The cavity size was normal. Systolic function was normal. - Pulmonary arteries: PA peak pressure: 36mm Hg (S). - Inferior vena cava: Poorly visualized. - Pericardium, extracardiac: A trivial pericardial effusion was identified posterior to the heart.  Assessment / Plan: 1. Dilated CM - EF is 50 to 55% per recent echo - has grade 1 diastolic HF - most recent echo is reviewed with her. She looks compensated on exam. She has no symptoms whatsoever.  2. Depression - trying to stay active  3. HTN - great BP control.   Her labs are checked by PCP. I will see her back in a year. Encouraged her to stay active.   Patient is agreeable to this plan and will call if any problems develop in the interim.   Rosalio Macadamia, RN, ANP-C [facility] [address] [place], [place]  [address]

## 2013-05-13 NOTE — Patient Instructions (Signed)
I think you are doing well  Stay active  Stay on your current medicines  Your ultrasound of your heart looks good - normal pumping function of your heart  We will see you in a year  Call the [facility] office at [phone] if you have any questions, problems or concerns.

## 2013-06-03 ENCOUNTER — Encounter: Payer: Self-pay | Admitting: Internal Medicine

## 2013-06-03 ENCOUNTER — Telehealth: Payer: Self-pay | Admitting: Internal Medicine

## 2013-06-03 ENCOUNTER — Ambulatory Visit (INDEPENDENT_AMBULATORY_CARE_PROVIDER_SITE_OTHER): Payer: Medicare Other | Admitting: Internal Medicine

## 2013-06-03 VITALS — BP 150/100 | HR 83 | Temp 98.4°F | Wt 139.4 lb

## 2013-06-03 DIAGNOSIS — J0410 Acute tracheitis without obstruction: Secondary | ICD-10-CM

## 2013-06-03 MED ORDER — AZITHROMYCIN 250 MG PO TABS: ORAL_TABLET | ORAL | Status: DC

## 2013-06-03 NOTE — Patient Instructions (Addendum)
Rest, fluids , tylenol For cough, take Mucinex DM twice a day as needed  Take the antibiotic as prescribed  (zithromax) Call if no better in few days Call anytime if the symptoms are severe  -- Your BP is  slightly elevated today, check your BP 3 or 4 times in the next week, be sure it goes back to around 120/80. If not back to normal let us know.

## 2013-06-03 NOTE — Telephone Encounter (Signed)
Please advise 

## 2013-06-03 NOTE — Telephone Encounter (Signed)
Pt. Has scheduled appointment today at 1330

## 2013-06-03 NOTE — Telephone Encounter (Signed)
Patient Information:  Caller Name: Archer Asa  Phone: [phone]  Patient: [name] [name], [name] [name]  Gender: Female  DOB: 24-Jan-1927  Age: 79 Years  PCP: Willow Ora  Office Follow Up:  Does the office need to follow up with this patient?: No  Instructions For The Office: N/A   Symptoms  Reason For Call & Symptoms: Daughter; Harrison Mons calling about her mom having symptoms similar to hers that was treated by Dr. Drue Novel.  Cough, scratchy throat and nasal congestion. Onset 06/01/13.  Possible wheezing reported.  Emergent symptoms ruled out.  Go to [facility] due to "Wheezing is present".  Caller requested Rx be called in since she was treated recently.  Advised of office policy  Reviewed Health History In EMR: Yes  Reviewed Medications In EMR: Yes  Reviewed Allergies In EMR: Yes  Reviewed Surgeries / Procedures: Yes  Date of Onset of Symptoms: 06/01/2013  Treatments Tried: Mucinex DM  Treatments Tried Worked: No  Any Fever: Yes  Fever Taken: Tactile  Fever Time Of Reading: 18:00:27  Fever Last Reading: N/A  Guideline(s) Used:  Colds  Cough  Disposition Per Guideline:   Go to Office Now  Reason For Disposition Reached:   Wheezing is present  Advice Given:  Reassurance  Coughing is the way that our lungs remove irritants and mucus. It helps protect our lungs from getting pneumonia.  Here is some care advice that should help.  Cough Medicines:  OTC Cough Syrups: The most common cough suppressant in OTC cough medications is dextromethorphan. Often the letters "DM" appear in the name.  OTC Cough Drops: Cough drops can help a lot, especially for mild coughs. They reduce coughing by soothing your irritated throat and removing that tickle sensation in the back of the throat. Cough drops also have the advantage of portability - you can carry them with you.  Home Remedy - Hard Candy: Hard candy works just as well as medicine-flavored OTC cough drops. Diabetics should use sugar-free  candy.  OTC Cough Syrup - Dextromethorphan:  Cough syrups containing the cough suppressant dextromethorphan (DM) may help decrease your cough. Cough syrups work best for coughs that keep you awake at night. They can also sometimes help in the late stages of a respiratory infection when the cough is dry and hacking. They can be used along with cough drops.  Coughing Spasms:  Drink warm fluids. Inhale warm mist (Reason: both relax the airway and loosen up the phlegm).  Prevent Dehydration:  Drink adequate liquids.  Fever Medicines:  For fevers above 101 F (38.3 C) take either acetaminophen or ibuprofen.  Call Back If:  Difficulty breathing  Cough lasts more than 3 weeks  Fever lasts > 3 days  You become worse.  RN Overrode Recommendation:  Make Appointment  Scheduled for first available appointment 13:30 with Dr. Drue Novel.  Appointment Scheduled:  06/03/2013 13:30:00 Appointment Scheduled Provider:  Willow Ora

## 2013-06-03 NOTE — Progress Notes (Signed)
  Subjective:    Patient ID: [name] [name], female    DOB: [birth year], 79 y.o.   MRN: [id number]  HPI Acute visit, here with her daughter. 3 days history of dry cough and some pain in the anterior chest at the trachial area. Has not taken any medication for her condition. Past Medical History  Diagnosis Date  . Cardiomyopathy     had normal cath 2007 w/ EF 52%; normal EF per echo 04/2013  . Hx of colonic polyps   . Hypertension   . Osteopenia   . Anemia   . Osteoarthritis   . Anxiety and depression 07/16/2009  . ASD (atrial septal defect)      per Bubble study 2004   Past Surgical History  Procedure Laterality Date  . Tonsillectomy    . Back surgery  2000    due to fall  . Breast surgery      negative Bx    Review of Systems Denies fevers, no chills but felt "cold" Mild shortness or breath with cough Denies nausea, vomiting, diarrhea. Some sore throat runny nose. Daughter had similar symptoms last week, diagnosed with bronchitis    Objective:   Physical Exam BP 150/100  Pulse 83  Temp(Src) 98.4 F (36.9 C) (Oral)  Wt 139 lb 6.4 oz (63.231 kg)  BMI 23.2 kg/m2  SpO2 99%  General -- alert, well-developed, NAD   HEENT -- TMs obscured by wax, throat w/o redness, face symmetric and not tender to palpation, voice hoarse, nose clear Lungs -- normal respiratory effort, no intercostal retractions, no accessory muscle use, and normal breath sounds except for few ronchi.   Heart-- normal rate, regular rhythm, no murmur Extremities-- no pretibial edema bilaterally  Neurologic-- alert & oriented X3 and strength normal in all extremities. Psych-- Cognition and judgment appear intact. Alert and cooperative with normal attention span and concentration.  not anxious appearing and not depressed appearing.       Assessment & Plan:  URI, tracheitis: See instructions

## 2013-07-09 ENCOUNTER — Ambulatory Visit: Payer: Medicare Other | Admitting: Nurse Practitioner

## 2013-07-09 ENCOUNTER — Ambulatory Visit: Payer: Medicare Other | Admitting: Internal Medicine

## 2013-07-25 ENCOUNTER — Ambulatory Visit (INDEPENDENT_AMBULATORY_CARE_PROVIDER_SITE_OTHER): Payer: Medicare Other | Admitting: Internal Medicine

## 2013-07-25 ENCOUNTER — Encounter: Payer: Self-pay | Admitting: Internal Medicine

## 2013-07-25 VITALS — BP 139/76 | HR 78 | Temp 98.8°F | Wt 139.4 lb

## 2013-07-25 DIAGNOSIS — I10 Essential (primary) hypertension: Secondary | ICD-10-CM

## 2013-07-25 DIAGNOSIS — R7303 Prediabetes: Secondary | ICD-10-CM

## 2013-07-25 DIAGNOSIS — R7309 Other abnormal glucose: Secondary | ICD-10-CM

## 2013-07-25 DIAGNOSIS — F341 Dysthymic disorder: Secondary | ICD-10-CM

## 2013-07-25 DIAGNOSIS — F329 Major depressive disorder, single episode, unspecified: Secondary | ICD-10-CM

## 2013-07-25 LAB — BASIC METABOLIC PANEL
Chloride: 102 mEq/L (ref 96–112)
Creat: 1.12 mg/dL — ABNORMAL HIGH (ref 0.50–1.10)
Potassium: 3.7 mEq/L (ref 3.5–5.3)

## 2013-07-25 LAB — HEMOGLOBIN A1C
Hgb A1c MFr Bld: 5.8 % — ABNORMAL HIGH (ref <5.7)
Mean Plasma Glucose: 120 mg/dL — ABNORMAL HIGH (ref <117)

## 2013-07-25 NOTE — Assessment & Plan Note (Addendum)
BP today slightly higher than her ambulatory BPs here She is taking losartan, lisinopril and hydrochlorothiazide, a somewhat uncommon combination has been on the same regimen  x a while and BP is well controlled. She does have mild  hyponatremia. Plan: BP well-controlled, no change BMP today.

## 2013-07-25 NOTE — Patient Instructions (Signed)
You need a high dose flu shot, we don't have that shot today , please call in 2-3 weeks,  otherwise you can get it at your local pharmacy.  Get your blood work before you leave  Next visit in  3-4 months  for a routine office visit Please make an appointment before you leave the office today (or call few weeks in advance)

## 2013-07-25 NOTE — Assessment & Plan Note (Signed)
Under excellent control, followup by Dr. Donell Beers

## 2013-07-25 NOTE — Progress Notes (Signed)
  Subjective:    Patient ID: [name] [name], female    DOB: 03/31/1927, 79 y.o.   MRN: [id number]  HPI Routine checkup Patient is doing very well, feels good, not fatigue, sleeping well, normal appetite. She is concerned about wax in her ears, likes that to be checked. Medication list reviewed, good compliance. Ambulatory BPs 120/80 usually  Past Medical History  Diagnosis Date  . Cardiomyopathy     had normal cath 2007 w/ EF 52%; normal EF per echo 04/2013  . Hx of colonic polyps   . Hypertension   . Osteopenia   . Anemia   . Osteoarthritis   . Anxiety and depression 07/16/2009  . ASD (atrial septal defect)      per Bubble study 2004   Past Surgical History  Procedure Laterality Date  . Tonsillectomy    . Back surgery  2000    due to fall  . Breast surgery      negative Bx    Review of Systems No chest pain or shortness or breath. No lower extremity edema. Anxiety and depression are under excellent control at this point    Objective:   Physical Exam  General -- alert, well-developed, NAD.  HEENT -- minimal amout of wax B  Lungs -- normal respiratory effort, no intercostal retractions, no accessory muscle use, and normal breath sounds.  Heart-- normal rate, regular rhythm, no murmur.   Extremities-- no pretibial edema bilaterally   Psych-- Cognition and judgment appear intact. Alert and cooperative with normal attention span and concentration. not anxious appearing and not depressed appearing.     Assessment & Plan:  Cerumen-- no impaction, rec H2O2 weekly

## 2013-07-25 NOTE — Assessment & Plan Note (Signed)
Check a A1c 

## 2013-07-26 ENCOUNTER — Encounter: Payer: Self-pay | Admitting: Internal Medicine

## 2013-07-28 ENCOUNTER — Telehealth: Payer: Self-pay | Admitting: *Deleted

## 2013-07-28 ENCOUNTER — Encounter: Payer: Self-pay | Admitting: *Deleted

## 2013-07-28 NOTE — Telephone Encounter (Signed)
Message copied by Eustace Quail on Mon Jul 28, 2013  8:31 AM ------      Message from: Willow Ora E      Created: Sun Jul 27, 2013 12:59 PM       Send a letter      [name],      Your potassium and kidney function are normal.      The blood sugar test called A1c is great, you have decrease it from 6 to 5.8.      These are very good results. ------

## 2013-07-28 NOTE — Telephone Encounter (Signed)
Letter sent. DJR  

## 2013-07-28 NOTE — Telephone Encounter (Signed)
Message copied by Armondo Cech J on Mon Jul 28, 2013  8:31 AM ------      Message from: PAZ, JOSE E      Created: Sun Jul 27, 2013 12:59 PM       Send a letter      [name] [name],      Your potassium and kidney function are normal.      The blood sugar test called A1c is great, you have decrease it from 6 to 5.8.      These are very good results. ------ 

## 2013-07-29 ENCOUNTER — Other Ambulatory Visit: Payer: Self-pay | Admitting: Internal Medicine

## 2013-07-29 NOTE — Telephone Encounter (Signed)
rx refilled per protocol. DJR  

## 2013-09-07 ENCOUNTER — Other Ambulatory Visit: Payer: Self-pay | Admitting: Internal Medicine

## 2013-09-08 NOTE — Telephone Encounter (Signed)
rx refilled per protocol. DJR  

## 2013-09-10 ENCOUNTER — Encounter: Payer: Self-pay | Admitting: Internal Medicine

## 2013-09-10 ENCOUNTER — Ambulatory Visit (INDEPENDENT_AMBULATORY_CARE_PROVIDER_SITE_OTHER): Payer: Medicare Other | Admitting: Internal Medicine

## 2013-09-10 VITALS — BP 145/83 | HR 87 | Temp 98.5°F | Wt 136.0 lb

## 2013-09-10 DIAGNOSIS — L989 Disorder of the skin and subcutaneous tissue, unspecified: Secondary | ICD-10-CM

## 2013-09-10 DIAGNOSIS — H6121 Impacted cerumen, right ear: Secondary | ICD-10-CM

## 2013-09-10 DIAGNOSIS — H6120 Impacted cerumen, unspecified ear: Secondary | ICD-10-CM

## 2013-09-10 MED ORDER — MUPIROCIN CALCIUM 2 % EX CREA: TOPICAL_CREAM | Freq: Three times a day (TID) | CUTANEOUS | Status: DC

## 2013-09-10 NOTE — Patient Instructions (Signed)
Use the Bactroban cream 3 times a day for one week,If the place and the skin is not better after the treatment let me know for a dermatology referral. Continue to use peroxide as needed to prevent wax buildup

## 2013-09-10 NOTE — Progress Notes (Signed)
  Subjective:    Patient ID: [name] [name], female    DOB: 07-Jun-1927, 79 y.o.   MRN: [id number]  HPI Acute visit States she feels a lot of wax in the ears although she couldn't describe her symptoms further. Also skin irritation x 3 week, L ear   Past Medical History  Diagnosis Date  . Cardiomyopathy     had normal cath 2007 w/ EF 52%; normal EF per echo 04/2013  . Hx of colonic polyps   . Hypertension   . Osteopenia   . Anemia   . Osteoarthritis   . Anxiety and depression 07/16/2009  . ASD (atrial septal defect)      per Bubble study 2004   Past Surgical History  Procedure Laterality Date  . Tonsillectomy    . Back surgery  2000    due to fall  . Breast surgery      negative Bx     Review of Systems Denies any recent URI No ear pain or discharge Some decreased hearing bilaterally but no tinnitus.     Objective:   Physical Exam  HENT:  Head:     BP 145/83  Pulse 87  Temp(Src) 98.5 F (36.9 C)  Wt 136 lb (61.689 kg)  BMI 22.63 kg/m2  SpO2 96%  General -- alert, well-developed, NAD.  HEENT--  L-- Small amount of wax R--  some wax Extremities-- no pretibial edema bilaterally  Psych-- Cognition and judgment appear intact. Cooperative with normal attention span and concentration. No anxious appearing , no depressed appearing.     Assessment & Plan:   Cerumen impaction, R ear lavaged, after lavage tympanic membrane was normal. Plan:, continue with H2 O2. Is decreased hearing persists, recommend ENT eval  Skin lesion, left ear: Bactroban 3 times a day for one week, if abnormality persists will need to see dermatology.

## 2013-09-11 ENCOUNTER — Ambulatory Visit (INDEPENDENT_AMBULATORY_CARE_PROVIDER_SITE_OTHER): Payer: Medicare Other

## 2013-09-11 DIAGNOSIS — Z23 Encounter for immunization: Secondary | ICD-10-CM

## 2013-09-24 ENCOUNTER — Telehealth: Payer: Self-pay | Admitting: Internal Medicine

## 2013-09-24 NOTE — Telephone Encounter (Signed)
Patient wants to know how many drops and how often she should use peroxide in order to prevent wax build up. She states that Dr. Drue Novel mentioned using peroxide at her last OV when she had her wax flushed out.  Patient also wants to know if she should continue to use mupirocin cream (BACTROBAN) 2 % if she is doing better. States that she has used already for 10 days. Please advise.

## 2013-09-24 NOTE — Telephone Encounter (Signed)
4 drops per side 2 times a week

## 2013-09-26 NOTE — Telephone Encounter (Signed)
Pt notified. DJR  

## 2013-11-25 ENCOUNTER — Ambulatory Visit (INDEPENDENT_AMBULATORY_CARE_PROVIDER_SITE_OTHER): Payer: Medicare Other | Admitting: Internal Medicine

## 2013-11-25 ENCOUNTER — Encounter: Payer: Self-pay | Admitting: Internal Medicine

## 2013-11-25 VITALS — BP 145/85 | HR 70 | Temp 98.0°F | Wt 134.0 lb

## 2013-11-25 DIAGNOSIS — I10 Essential (primary) hypertension: Secondary | ICD-10-CM

## 2013-11-25 DIAGNOSIS — F341 Dysthymic disorder: Secondary | ICD-10-CM

## 2013-11-25 DIAGNOSIS — F419 Anxiety disorder, unspecified: Secondary | ICD-10-CM

## 2013-11-25 DIAGNOSIS — F32A Depression, unspecified: Secondary | ICD-10-CM

## 2013-11-25 DIAGNOSIS — H6120 Impacted cerumen, unspecified ear: Secondary | ICD-10-CM

## 2013-11-25 DIAGNOSIS — F329 Major depressive disorder, single episode, unspecified: Secondary | ICD-10-CM

## 2013-11-25 MED ORDER — LISINOPRIL-HYDROCHLOROTHIAZIDE 20-12.5 MG PO TABS: ORAL_TABLET | ORAL | Status: DC

## 2013-11-25 MED ORDER — LOSARTAN POTASSIUM 100 MG PO TABS
ORAL_TABLET | ORAL | Status: DC
Start: 2013-11-25 — End: 2014-12-22

## 2013-11-25 NOTE — Progress Notes (Signed)
   Subjective:    Patient ID: [name] [name], female    DOB: 1927-09-06, 80 y.o.   MRN: [id number]  HPI Routine office visit In general feeling very well, med list reviewed, no changes needed, good compliance with all meds without apparent side effects. No ambulatory BPs. Saw Dr. Donell Beers- psychiatry, was given good results and she feels well. Wonders if she is still has wax in the ears, denies pain or discomfort  Past Medical History  Diagnosis Date  . Cardiomyopathy     had normal cath 2007 w/ EF 52%; normal EF per echo 04/2013  . Hx of colonic polyps   . Hypertension   . Osteopenia   . Anemia   . Osteoarthritis   . Anxiety and depression 07/16/2009  . ASD (atrial septal defect)      per Bubble study 2004   Past Surgical History  Procedure Laterality Date  . Tonsillectomy    . Back surgery  2000    due to fall  . Breast surgery      negative Bx    Review of Systems Denies chest pain or shortness or breath No nausea, vomiting. Still takes MiraLax approximately 3 times a week weight good results.    Objective:   Physical Exam BP 145/85  Pulse 70  Temp(Src) 98 F (36.7 C)  Wt 134 lb (60.782 kg)  SpO2 98% General -- alert, well-developed, NAD.   HEENT-- Not pale.  Wax impaction on the left Lungs -- normal respiratory effort, no intercostal retractions, no accessory muscle use, and normal breath sounds.  Heart-- normal rate, regular rhythm, no murmur.   Extremities-- no pretibial edema bilaterally  Neurologic--  alert & oriented X3. Speech normal, gait normal, strength normal in all extremities.  Psych-- Cognition and judgment appear intact. Cooperative with normal attention span and concentration. No anxious or depressed appearing.      Assessment & Plan:  Cerumen impaction, removed with a spoon from the left ear, TM looks normal

## 2013-11-25 NOTE — Assessment & Plan Note (Signed)
Under excellent control, no change, followup by psychiatry

## 2013-11-25 NOTE — Patient Instructions (Signed)
Next visit is for a physical exam in 3 months ,  fasting Please make an appointment     

## 2013-11-25 NOTE — Assessment & Plan Note (Signed)
Well-controlled, recent BMP okay, needs refills which will be provided.

## 2013-11-25 NOTE — Progress Notes (Signed)
Pre visit review using our clinic review tool, if applicable. No additional management support is needed unless otherwise documented below in the visit note. 

## 2013-12-17 ENCOUNTER — Telehealth: Payer: Self-pay | Admitting: Internal Medicine

## 2013-12-17 NOTE — Telephone Encounter (Signed)
Relevant patient education assigned to patient using Emmi. ° °

## 2014-01-15 ENCOUNTER — Other Ambulatory Visit: Payer: Self-pay | Admitting: *Deleted

## 2014-01-15 MED ORDER — LISINOPRIL-HYDROCHLOROTHIAZIDE 20-12.5 MG PO TABS: ORAL_TABLET | ORAL | Status: DC

## 2014-01-20 ENCOUNTER — Encounter: Payer: Self-pay | Admitting: Internal Medicine

## 2014-03-01 ENCOUNTER — Other Ambulatory Visit: Payer: Self-pay | Admitting: Internal Medicine

## 2014-03-02 ENCOUNTER — Telehealth: Payer: Self-pay | Admitting: Internal Medicine

## 2014-03-02 DIAGNOSIS — Z0000 Encounter for general adult medical examination without abnormal findings: Secondary | ICD-10-CM

## 2014-03-02 NOTE — Telephone Encounter (Signed)
Patient also states she is having groin/hip pain and would like to if she needs an appointment or can it wait until next week when she comes for her physical.

## 2014-03-02 NOTE — Telephone Encounter (Signed)
Spoke with pt and appt made for hip pain and future orders placed for labs. Patient would like to have them drawn prior to appt.

## 2014-03-02 NOTE — Telephone Encounter (Signed)
Patient would like to come in before her appt on 02/28/14 and have labs. Can you place orders and send back to me so I can schedule the patient? Thanks. CB# [phone]

## 2014-03-03 ENCOUNTER — Other Ambulatory Visit (INDEPENDENT_AMBULATORY_CARE_PROVIDER_SITE_OTHER): Payer: Medicare Other

## 2014-03-03 DIAGNOSIS — I10 Essential (primary) hypertension: Secondary | ICD-10-CM

## 2014-03-03 DIAGNOSIS — Z0000 Encounter for general adult medical examination without abnormal findings: Secondary | ICD-10-CM

## 2014-03-03 DIAGNOSIS — E785 Hyperlipidemia, unspecified: Secondary | ICD-10-CM

## 2014-03-03 LAB — TSH: TSH: 1.99 u[IU]/mL (ref 0.35–5.50)

## 2014-03-03 LAB — CBC WITH DIFFERENTIAL/PLATELET
Basophils Absolute: 0 10*3/uL (ref 0.0–0.1)
Basophils Relative: 1.2 % (ref 0.0–3.0)
Eosinophils Absolute: 0.3 10*3/uL (ref 0.0–0.7)
Eosinophils Relative: 7.2 % — ABNORMAL HIGH (ref 0.0–5.0)
HCT: 33.6 % — ABNORMAL LOW (ref 36.0–46.0)
Hemoglobin: 11.1 g/dL — ABNORMAL LOW (ref 12.0–15.0)
Lymphocytes Relative: 29.1 % (ref 12.0–46.0)
Lymphs Abs: 1.2 10*3/uL (ref 0.7–4.0)
MCHC: 33.1 g/dL (ref 30.0–36.0)
MCV: 99.6 fl (ref 78.0–100.0)
Monocytes Absolute: 0.4 10*3/uL (ref 0.1–1.0)
Monocytes Relative: 9.9 % (ref 3.0–12.0)
Neutro Abs: 2.2 10*3/uL (ref 1.4–7.7)
Neutrophils Relative %: 52.6 % (ref 43.0–77.0)
Platelets: 229 10*3/uL (ref 150.0–400.0)
RBC: 3.37 Mil/uL — ABNORMAL LOW (ref 3.87–5.11)
RDW: 13.8 % (ref 11.5–14.6)
WBC: 4.3 10*3/uL — ABNORMAL LOW (ref 4.5–10.5)

## 2014-03-03 LAB — BASIC METABOLIC PANEL
BUN: 35 mg/dL — ABNORMAL HIGH (ref 6–23)
CO2: 26 mEq/L (ref 19–32)
Calcium: 9.8 mg/dL (ref 8.4–10.5)
Chloride: 104 mEq/L (ref 96–112)
Creatinine, Ser: 1.3 mg/dL — ABNORMAL HIGH (ref 0.4–1.2)
GFR: 48.8 mL/min — ABNORMAL LOW (ref >60.00)
Glucose, Bld: 82 mg/dL (ref 70–99)
Potassium: 4 mEq/L (ref 3.5–5.1)
Sodium: 139 mEq/L (ref 135–145)

## 2014-03-03 LAB — LIPID PANEL
CHOLESTEROL: 177 mg/dL (ref 0–200)
HDL: 60.1 mg/dL (ref >39.00)
LDL Cholesterol: 101 mg/dL — ABNORMAL HIGH (ref 0–99)
TRIGLYCERIDES: 78 mg/dL (ref 0.0–149.0)
Total CHOL/HDL Ratio: 3
VLDL: 15.6 mg/dL (ref 0.0–40.0)

## 2014-03-03 LAB — HEMOGLOBIN A1C: Hgb A1c MFr Bld: 5.7 % (ref 4.6–6.5)

## 2014-03-04 ENCOUNTER — Ambulatory Visit (INDEPENDENT_AMBULATORY_CARE_PROVIDER_SITE_OTHER): Payer: Medicare Other | Admitting: Internal Medicine

## 2014-03-04 ENCOUNTER — Encounter: Payer: Self-pay | Admitting: Internal Medicine

## 2014-03-04 VITALS — BP 142/84 | HR 82 | Temp 97.9°F | Wt 139.0 lb

## 2014-03-04 DIAGNOSIS — M1990 Unspecified osteoarthritis, unspecified site: Secondary | ICD-10-CM

## 2014-03-04 MED ORDER — PREDNISONE 10 MG PO TABS: 20.0000 mg | ORAL_TABLET | Freq: Every day | ORAL | Status: DC

## 2014-03-04 NOTE — Progress Notes (Signed)
Subjective:    Patient ID: [name] [name], female    DOB: 31-Aug-1927, 80 y.o.   MRN: [id number]  DOS:  03/04/2014 Type of  visit: Acute visit One-week history of left hip pain, worse when she walks particularly if he walks up stairs. Has been taking Motrin OTC 2 tablets 3 times a day without much help.    ROS Denies nausea, vomiting, abdominal pain or change in the color of the stools No fall or injury No rash in the hip area No  bulge or mass in the groins  Past Medical History  Diagnosis Date  . Cardiomyopathy     had normal cath 2007 w/ EF 52%; normal EF per echo 04/2013  . Hx of colonic polyps   . Hypertension   . Osteopenia   . Anemia   . Osteoarthritis   . Anxiety and depression 07/16/2009  . ASD (atrial septal defect)      per Bubble study 2004    Past Surgical History  Procedure Laterality Date  . Tonsillectomy    . Back surgery  2000    due to fall  . Breast surgery      negative Bx    History   Social History  . Marital Status: Widowed    Spouse Name: N/A    Number of Children: 1  . Years of Education: N/A   Occupational History  . retired--Motivational Speaker    Social History Main Topics  . Smoking status: Never Smoker   . Smokeless tobacco: Never Used  . Alcohol Use: No  . Drug Use: No  . Sexual Activity: Not Currently   Other Topics Concern  . Not on file   Social History Narrative   Widow, moved to [facility] 2007.   Has a daughter Vikki Ports and a G-d in [place]; lost her son-in-law   Used to live by herself,  Linton Ham w/ her daughter ~ 11-2012 due to depression  although she still drives and is independent for the most par, volunteers at church               Medication List       This list is accurate as of: 03/04/14  6:35 PM.  Always use your most recent med list.               aspirin 325 MG EC tablet  Take 325 mg by mouth every evening.     buPROPion 150 MG 24 hr tablet  Commonly known as:  WELLBUTRIN XL  Take 450 mg by  mouth every morning.     carvedilol 12.5 MG tablet  Commonly known as:  COREG  TAKE ONE TABLET BY MOUTH TWICE DAILY     clonazePAM 0.5 MG tablet  Commonly known as:  KLONOPIN  Take 1.5 mg by mouth at bedtime.     lisinopril-hydrochlorothiazide 20-12.5 MG per tablet  Commonly known as:  PRINZIDE,ZESTORETIC  Take two tablets by mouth daily     losartan 100 MG tablet  Commonly known as:  COZAAR  TAKE ONE TABLET BY MOUTH ONE TIME DAILY     mirtazapine 30 MG tablet  Commonly known as:  REMERON  Take 30 mg by mouth at bedtime.     multivitamin tablet  Take 1 tablet by mouth every morning.     mupirocin cream 2 %  Commonly known as:  BACTROBAN  Apply topically 3 (three) times daily.     polyethylene glycol packet  Commonly known as:  MIRALAX / GLYCOLAX  Take 17 g by mouth 3 (three) times a week. Mon, wed, and fri     predniSONE 10 MG tablet  Commonly known as:  DELTASONE  Take 2 tablets (20 mg total) by mouth daily.           Objective:   Physical Exam BP 142/84  Pulse 82  Temp(Src) 97.9 F (36.6 C)  Wt 139 lb (63.05 kg)  SpO2 99%  General -- alert, well-developed, NAD.  Abdomen-- Not distended, good bowel sounds,soft, non-tender.  groin-- no mass or hernia Extremities--  no pretibial edema bilaterally .  Hip ROM wnl w/o pain Slightly tender at the left trochanteric bursa and L and  iliac crest, no rash  Neurologic--  alert & oriented X3. Speech normal, gait normal and not antalgic, strength normal in all extremities.  DTRs symmetrically decreased   Psych-- Cognition and judgment appear intact. Cooperative with normal attention span and concentration. No anxious or depressed appearing.         Assessment & Plan:

## 2014-03-04 NOTE — Patient Instructions (Signed)
Stop motrin-ibuprofen , it may affect your stomach Start prednisone for 5 days Tylenol  500 mg OTC 2 tabs a day every 8 hours as needed for pain Put ICE at the area of pain  twice a day If not  improvement next week, will get x-rays

## 2014-03-04 NOTE — Progress Notes (Signed)
Pre visit review using our clinic review tool, if applicable. No additional management support is needed unless otherwise documented below in the visit note. 

## 2014-03-04 NOTE — Assessment & Plan Note (Signed)
Complaining of pain at the left hip, she is tender at the left iliac crest and trochanteric bursa. Trochanteric bursitis?. She's taking motrin 3 times a day Plan: Discontinue Motrin, take Tylenol Short dose of prednisone If no better will get a X-ray She has an appointment to see me next week for a CPX, will reassess at the time.

## 2014-03-09 ENCOUNTER — Telehealth: Payer: Self-pay

## 2014-03-09 NOTE — Telephone Encounter (Signed)
Left message for call back Non identifiable  Flu vaccine--08/2013 Tdap--2008 PNA--2008 Shingles vaccine--2009 MMG--10/2013--[facility]--neg BD--03/2013--lowest T score -1.1---mild osteopenia CCS--[facility]--08/30/2010--no further CCS due to age

## 2014-03-10 ENCOUNTER — Encounter: Payer: Self-pay | Admitting: Internal Medicine

## 2014-03-10 ENCOUNTER — Ambulatory Visit (INDEPENDENT_AMBULATORY_CARE_PROVIDER_SITE_OTHER): Payer: Medicare Other | Admitting: Internal Medicine

## 2014-03-10 VITALS — BP 141/80 | HR 80 | Temp 97.9°F | Ht 63.5 in | Wt 135.0 lb

## 2014-03-10 DIAGNOSIS — M899 Disorder of bone, unspecified: Secondary | ICD-10-CM

## 2014-03-10 DIAGNOSIS — R7303 Prediabetes: Secondary | ICD-10-CM

## 2014-03-10 DIAGNOSIS — I10 Essential (primary) hypertension: Secondary | ICD-10-CM

## 2014-03-10 DIAGNOSIS — F329 Major depressive disorder, single episode, unspecified: Secondary | ICD-10-CM

## 2014-03-10 DIAGNOSIS — F341 Dysthymic disorder: Secondary | ICD-10-CM

## 2014-03-10 DIAGNOSIS — M949 Disorder of cartilage, unspecified: Secondary | ICD-10-CM

## 2014-03-10 DIAGNOSIS — R7309 Other abnormal glucose: Secondary | ICD-10-CM

## 2014-03-10 DIAGNOSIS — I428 Other cardiomyopathies: Secondary | ICD-10-CM

## 2014-03-10 DIAGNOSIS — Z0000 Encounter for general adult medical examination without abnormal findings: Secondary | ICD-10-CM

## 2014-03-10 DIAGNOSIS — F419 Anxiety disorder, unspecified: Secondary | ICD-10-CM

## 2014-03-10 DIAGNOSIS — Z23 Encounter for immunization: Secondary | ICD-10-CM

## 2014-03-10 NOTE — Assessment & Plan Note (Signed)
Last A1c satisfactory 

## 2014-03-10 NOTE — Telephone Encounter (Signed)
Unable to reach prior to visit  

## 2014-03-10 NOTE — Patient Instructions (Signed)
Next visit is for routine check up in 6 months  No need to come back fasting Please make an appointment       Fall Prevention and Home Safety Falls cause injuries and can affect all age groups. It is possible to use preventive measures to significantly decrease the likelihood of falls. There are many simple measures which can make your home safer and prevent falls. OUTDOORS  Repair cracks and edges of walkways and driveways.  Remove high doorway thresholds.  Trim shrubbery on the main path into your home.  Have good outside lighting.  Clear walkways of tools, rocks, debris, and clutter.  Check that handrails are not broken and are securely fastened. Both sides of steps should have handrails.  Have leaves, snow, and ice cleared regularly.  Use sand or salt on walkways during winter months.  In the garage, clean up grease or oil spills. BATHROOM  Install night lights.  Install grab bars by the toilet and in the tub and shower.  Use non-skid mats or decals in the tub or shower.  Place a plastic non-slip stool in the shower to sit on, if needed.  Keep floors dry and clean up all water on the floor immediately.  Remove soap buildup in the tub or shower on a regular basis.  Secure bath mats with non-slip, double-sided rug tape.  Remove throw rugs and tripping hazards from the floors. BEDROOMS  Install night lights.  Make sure a bedside light is easy to reach.  Do not use oversized bedding.  Keep a telephone by your bedside.  Have a firm chair with side arms to use for getting dressed.  Remove throw rugs and tripping hazards from the floor. KITCHEN  Keep handles on pots and pans turned toward the center of the stove. Use back burners when possible.  Clean up spills quickly and allow time for drying.  Avoid walking on wet floors.  Avoid hot utensils and knives.  Position shelves so they are not too high or low.  Place commonly used objects within easy  reach.  If necessary, use a sturdy step stool with a grab bar when reaching.  Keep electrical cables out of the way.  Do not use floor polish or wax that makes floors slippery. If you must use wax, use non-skid floor wax.  Remove throw rugs and tripping hazards from the floor. STAIRWAYS  Never leave objects on stairs.  Place handrails on both sides of stairways and use them. Fix any loose handrails. Make sure handrails on both sides of the stairways are as long as the stairs.  Check carpeting to make sure it is firmly attached along stairs. Make repairs to worn or loose carpet promptly.  Avoid placing throw rugs at the top or bottom of stairways, or properly secure the rug with carpet tape to prevent slippage. Get rid of throw rugs, if possible.  Have an electrician put in a light switch at the top and bottom of the stairs. OTHER FALL PREVENTION TIPS  Wear low-heel or rubber-soled shoes that are supportive and fit well. Wear closed toe shoes.  When using a stepladder, make sure it is fully opened and both spreaders are firmly locked. Do not climb a closed stepladder.  Add color or contrast paint or tape to grab bars and handrails in your home. Place contrasting color strips on first and last steps.  Learn and use mobility aids as needed. Install an electrical emergency response system.  Turn on lights to avoid  dark areas. Replace light bulbs that burn out immediately. Get light switches that glow.  Arrange furniture to create clear pathways. Keep furniture in the same place.  Firmly attach carpet with non-skid or double-sided tape.  Eliminate uneven floor surfaces.  Select a carpet pattern that does not visually hide the edge of steps.  Be aware of all pets. OTHER HOME SAFETY TIPS  Set the water temperature for 120 F (48.8 C).  Keep emergency numbers on or near the telephone.  Keep smoke detectors on every level of the home and near sleeping areas. Document Released:  10/20/2002 Document Revised: 04/30/2012 Document Reviewed: 01/19/2012 [facility] Patient Information 2014 [place], [place].

## 2014-03-10 NOTE — Assessment & Plan Note (Signed)
Due for a visit with cards , asx

## 2014-03-10 NOTE — Progress Notes (Signed)
Subjective:    Patient ID: [name] [name], female    DOB: [birth year], 80 y.o.   MRN: [id number]  DOS:  03/10/2014 Type of  visit:  Here for Medicare AWV: 1.         Risk factors based on Past M, S, F history: reviewed   2.         Physical Activities: [place] to the [place] 2[place] /[place]  3.         Depression/mood: (-) screening  4.         Hearing: slt  problems ? No affecting ADLs, no tinnitus   5.         ADL's: totally independent, still drives   6.         Fall Risk: no falls this year  7.         Home Safety: does feel safe ar home   8.         Height, weight, &visual acuity: see VS,  vision well corrected  w/ glasses , sees eye doctor q year 9.         Counseling: yes, see a/p   10.       Labs ordered based on risk factors: yes   11.       Referral Coordination: yes , see below   12.       Care Plan:see a/p   13.       Cognitive Assessment: motor skills appropriate for age, memory and cognition seems > average for age.  in addition we discussed the following issues Hypertension, good medication compliance, ambulatory BPs 130, 140 Chronic constipation, or MiraLax, symptoms well-controlled Was recently seen with hip pain, symptoms improved after a small dose of prednisone. Wonders if she needs her ears checked for wax   ROS No  CP, SOB No palpitations Denies  nausea, vomiting diarrhea Denies  blood in the stools (-) cough, sputum production (-) wheezing, chest congestion No dysuria, gross hematuria, difficulty urinating  No vaginal discharge, bleed   No headaches. Denies dizziness   Past Medical History  Diagnosis Date  . Cardiomyopathy     had normal cath 2007 w/ EF 52%; normal EF per echo 04/2013  . Hx of colonic polyps   . Hypertension   . Osteopenia   . Anemia   . Osteoarthritis   . Anxiety and depression 07/16/2009  . ASD (atrial septal defect)      per Bubble study 2004    Past Surgical History  Procedure Laterality Date  . Tonsillectomy    . Back  surgery  2000    due to fall  . Breast surgery      negative Bx    History   Social History  . Marital Status: Widowed    Spouse Name: N/A    Number of Children: 1  . Years of Education: N/A   Occupational History  . retired--Motivational Speaker    Social History Main Topics  . Smoking status: Never Smoker   . Smokeless tobacco: Never Used  . Alcohol Use: No  . Drug Use: No  . Sexual Activity: Not Currently   Other Topics Concern  . Not on file   Social History Narrative   Widow, moved to [facility] 2007.   Has a daughter Vikki Ports and a G-d in [place]; lost her son-in-law, moved w/ daughter 2014 due to depression  although she still drives and is independent for the most par, volunteers at [employer]  Family History  Problem Relation Age of Onset  . Diabetes Neg Hx   . Cancer Neg Hx     colon or breast  . Heart attack Father     F and 2 brothers (no early onset)  . Hypertension Other   . Heart disease Mother          Medication List       This list is accurate as of: 03/10/14  5:17 PM.  Always use your most recent med list.               aspirin 325 MG EC tablet  Take 325 mg by mouth every evening.     buPROPion 150 MG 24 hr tablet  Commonly known as:  WELLBUTRIN XL  Take 450 mg by mouth every morning.     carvedilol 12.5 MG tablet  Commonly known as:  COREG  TAKE ONE TABLET BY MOUTH TWICE DAILY     clonazePAM 0.5 MG tablet  Commonly known as:  KLONOPIN  Take 1.5 mg by mouth at bedtime.     lisinopril-hydrochlorothiazide 20-12.5 MG per tablet  Commonly known as:  PRINZIDE,ZESTORETIC  Take two tablets by mouth daily     losartan 100 MG tablet  Commonly known as:  COZAAR  TAKE ONE TABLET BY MOUTH ONE TIME DAILY     mirtazapine 30 MG tablet  Commonly known as:  REMERON  Take 30 mg by mouth at bedtime.     multivitamin tablet  Take 1 tablet by mouth every morning.     polyethylene glycol packet  Commonly known as:  MIRALAX / GLYCOLAX  Take  17 g by mouth 3 (three) times a week. Mon, wed, and fri     TYLENOL PO  Take 500 mg by mouth daily.           Objective:   Physical Exam BP 141/80  Pulse 80  Temp(Src) 97.9 F (36.6 C)  Ht 5' 3.5" (1.613 m)  Wt 135 lb (61.236 kg)  BMI 23.54 kg/m2  SpO2 99% General -- alert, well-developed, NAD.  Neck --no thyromegaly  HEENT-- Not pale. TMs small amount of wax B, removed w/ a spoon from the R Lungs -- normal respiratory effort, no intercostal retractions, no accessory muscle use, and normal breath sounds.  Heart-- normal rate, regular rhythm, no murmur.  Abdomen-- Not distended, good bowel sounds,soft, non-tender.  Extremities-- no pretibial edema bilaterally  Neurologic--  alert & oriented X3. Speech normal, gait normal, strength normal in all extremities.  Psych-- Cognition and judgment appear intact. Cooperative with normal attention span and concentration. Slt apprehensive about her healthy,no  depressed appearing     Assessment & Plan:

## 2014-03-10 NOTE — Assessment & Plan Note (Signed)
Sx well controlled  

## 2014-03-10 NOTE — Assessment & Plan Note (Addendum)
Td 2-08, shingles shot 2009, pneumonia shot 1-08 prevnar today  Has yearly MMGs, Last month 2014 negative, we agreed no MMG this year, revisit the issue 2016  PAP 2-08 and 9-11 (-)---> previously we decided not to do any further Paps   s/p Cscope Dr Randa Evens 2006, and 10- 2011, no further scopes, iFOB in 5 years?  Diet and exercise discussed All  recent labs discussed, they're very good, copy provided

## 2014-03-10 NOTE — Assessment & Plan Note (Signed)
BD--02/2013--lowest T score -1.1---mild osteopenia  rec Calcium and vitamin D

## 2014-03-10 NOTE — Assessment & Plan Note (Signed)
Well-controlled, last BMP normal 

## 2014-03-10 NOTE — Progress Notes (Signed)
Pre visit review using our clinic review tool, if applicable. No additional management support is needed unless otherwise documented below in the visit note. 

## 2014-04-20 ENCOUNTER — Telehealth: Payer: Self-pay | Admitting: Internal Medicine

## 2014-04-20 DIAGNOSIS — M1990 Unspecified osteoarthritis, unspecified site: Secondary | ICD-10-CM

## 2014-04-20 NOTE — Telephone Encounter (Signed)
Caller name: [place]  Relation to OM:BTDHRCB  Call back number: [phone] Pharmacy:  Reason for call: patient called stating that she is still having left hip pain and would like an x-ray. Please advise.

## 2014-04-21 NOTE — Telephone Encounter (Signed)
Recently seen w/ pain, XR order entered

## 2014-04-29 ENCOUNTER — Telehealth: Payer: Self-pay | Admitting: Internal Medicine

## 2014-04-29 ENCOUNTER — Encounter: Payer: Self-pay | Admitting: Internal Medicine

## 2014-04-29 ENCOUNTER — Ambulatory Visit (INDEPENDENT_AMBULATORY_CARE_PROVIDER_SITE_OTHER): Payer: Medicare Other | Admitting: Internal Medicine

## 2014-04-29 VITALS — BP 142/72 | HR 83 | Temp 98.0°F | Wt 142.0 lb

## 2014-04-29 DIAGNOSIS — M109 Gout, unspecified: Secondary | ICD-10-CM

## 2014-04-29 DIAGNOSIS — M1990 Unspecified osteoarthritis, unspecified site: Secondary | ICD-10-CM

## 2014-04-29 DIAGNOSIS — L989 Disorder of the skin and subcutaneous tissue, unspecified: Secondary | ICD-10-CM

## 2014-04-29 HISTORY — DX: Gout, unspecified: M10.9

## 2014-04-29 LAB — URIC ACID: URIC ACID, SERUM: 5.9 mg/dL (ref 2.4–7.0)

## 2014-04-29 LAB — BASIC METABOLIC PANEL
BUN: 30 mg/dL — ABNORMAL HIGH (ref 6–23)
CO2: 28 meq/L (ref 19–32)
CREATININE: 1.3 mg/dL — AB (ref 0.4–1.2)
Calcium: 10 mg/dL (ref 8.4–10.5)
Chloride: 104 mEq/L (ref 96–112)
GFR: 50.99 mL/min — ABNORMAL LOW (ref >60.00)
Glucose, Bld: 117 mg/dL — ABNORMAL HIGH (ref 70–99)
Potassium: 3.7 mEq/L (ref 3.5–5.1)
Sodium: 139 mEq/L (ref 135–145)

## 2014-04-29 MED ORDER — PREDNISONE 10 MG PO TABS: ORAL_TABLET | ORAL | Status: DC

## 2014-04-29 NOTE — Progress Notes (Signed)
Pre visit review using our clinic review tool, if applicable. No additional management support is needed unless otherwise documented below in the visit note. 

## 2014-04-29 NOTE — Telephone Encounter (Signed)
Patient in the office to see provider, advised to follow instructions provided today by Dr. Drue Novel

## 2014-04-29 NOTE — Patient Instructions (Signed)
Get your blood work before you leave   Take the prednisone as prescribed  Tylenol  500 mg OTC 2 tabs a day every 8 hours as needed for pain   apply ice twice a day to the hip  Keep leg elevated, rest  Follow the GOUT diet   Call if no better in few days or if symptoms continue

## 2014-04-29 NOTE — Telephone Encounter (Signed)
Caller name: [name]  Call back number:[phone]   Reason for call:  Pt is stating that she is still having a lot of back and hip pain.  The medication is not working. Pt states that they had a discussion with PCP about an injection to help.  Pt would like to have whatever injection will help.

## 2014-04-29 NOTE — Progress Notes (Signed)
Subjective:    Patient ID: [name] [name], female    DOB: 1927-09-22, 80 y.o.   MRN: [id number]  DOS:  04/29/2014 Type of  Visit: acute  History: Yesterday woke up w/ swelling, warmth and a very painful right great toe. Worse when she puts pressure on it. Denies any injury, fever, chills or previous history of gout.  Also, continue with left hip pain, particularly when she goes upstairs. Taking Tylenol approximately 2 tablets a day  Also, in 6 months history of 2 skin lesions in the left ankle. Patient concerned   ROS See HPI  Past Medical History  Diagnosis Date  . Cardiomyopathy     had normal cath 2007 w/ EF 52%; normal EF per echo 04/2013  . Hx of colonic polyps   . Hypertension   . Osteopenia   . Anemia   . Osteoarthritis   . Anxiety and depression 07/16/2009  . ASD (atrial septal defect)      per Bubble study 2004    Past Surgical History  Procedure Laterality Date  . Tonsillectomy    . Back surgery  2000    due to fall  . Breast surgery      negative Bx    History   Social History  . Marital Status: Widowed    Spouse Name: N/A    Number of Children: 1  . Years of Education: N/A   Occupational History  . retired--Motivational Speaker    Social History Main Topics  . Smoking status: Never Smoker   . Smokeless tobacco: Never Used  . Alcohol Use: No  . Drug Use: No  . Sexual Activity: Not Currently   Other Topics Concern  . Not on file   Social History Narrative   Widow, moved to [facility].   Has a daughter Vikki Ports and a G-d in [place]; lost her son-in-law, moved w/ daughter 2014 due to depression  although she still drives and is independent for the most par, volunteers at church               Medication List       This list is accurate as of: 04/29/14 11:59 PM.  Always use your most recent med list.               aspirin 325 MG EC tablet  Take 325 mg by mouth every evening.     buPROPion 150 MG 24 hr tablet  Commonly known as:   WELLBUTRIN XL  Take 450 mg by mouth every morning.     carvedilol 12.5 MG tablet  Commonly known as:  COREG  TAKE ONE TABLET BY MOUTH TWICE DAILY     clonazePAM 0.5 MG tablet  Commonly known as:  KLONOPIN  Take 1.5 mg by mouth at bedtime.     lisinopril-hydrochlorothiazide 20-12.5 MG per tablet  Commonly known as:  PRINZIDE,ZESTORETIC  Take two tablets by mouth daily     losartan 100 MG tablet  Commonly known as:  COZAAR  TAKE ONE TABLET BY MOUTH ONE TIME DAILY     mirtazapine 30 MG tablet  Commonly known as:  REMERON  Take 30 mg by mouth at bedtime.     multivitamin tablet  Take 1 tablet by mouth every morning.     polyethylene glycol packet  Commonly known as:  MIRALAX / GLYCOLAX  Take 17 g by mouth 3 (three) times a week. Mon, wed, and fri     predniSONE 10 MG tablet  Commonly known as:  DELTASONE  3 tabs x 3 days, 2 tabs x 3 days, 1 tab x 3 days     TYLENOL PO  Take 500 mg by mouth daily.           Objective:   Physical Exam BP 142/72  Pulse 83  Temp(Src) 98 F (36.7 C)  Wt 142 lb (64.411 kg)  SpO2 99%  General -- alert, well-developed, NAD.   Extremities-- no pretibial edema bilaterally  Hip rotation normal B. Moderately tender at L trochanteric bursa Base of the right great toe warm, tender, slightly red. No fluctuance, no demarcation such as in cellulitis. Neurologic--  alert & oriented X3. Speech normal, gait difficult due to podagra, strength symmetric and appropriate for age.  Psych-- Cognition and judgment appear intact. Cooperative with normal attention span and concentration. No anxious or depressed appearing.       Assessment & Plan:    Today , I spent more than 25   min with the patient: >50% of the time counseling regards the new diagnosis of gout as well as diet.

## 2014-04-30 NOTE — Assessment & Plan Note (Signed)
Hip pain, today findings are mostly consistent with trochanteric bursitis Prednisone should help. Also recommend ice and Tylenol ; ortho if sx no better

## 2014-04-30 NOTE — Assessment & Plan Note (Signed)
Skin lesions, 2 dark lesions in the left ankle noted  6 months ago. One  looks like postinflammatory hyperpigmentation by the other looks like a mole. Since this is new, will refer to dermatology

## 2014-04-30 NOTE — Assessment & Plan Note (Addendum)
podagra Symptoms and findings consistent with gout. Will check a BMP and a uric acid Diet discussed, dx discussed Will prescribe if prednisone and Tylenol. If symptoms become recurrent, will call to discontinue diuretics and use allopurinol. Patient knows to call if not improving soon

## 2014-05-01 NOTE — Progress Notes (Signed)
Left a detailed message on patients voice mail advising that labs were WNL and to continue with tylenol and prednisone as prescribed.

## 2014-05-19 ENCOUNTER — Telehealth: Payer: Self-pay | Admitting: Internal Medicine

## 2014-05-19 NOTE — Telephone Encounter (Addendum)
Caller name: Benicia Birman Relation to pt: patient Call back number: [phone] Pharmacy:  Reason for call: patient called and stated that her hip pain is gone and is not sure if she needs to keep taking prednisone. Please advise.

## 2014-05-19 NOTE — Telephone Encounter (Signed)
Spoke with patient and advised to continue the tylenol as needed only and that she does not need to continue with the prednisone.

## 2014-06-04 ENCOUNTER — Other Ambulatory Visit: Payer: Self-pay | Admitting: *Deleted

## 2014-06-04 MED ORDER — LISINOPRIL-HYDROCHLOROTHIAZIDE 20-12.5 MG PO TABS: ORAL_TABLET | ORAL | Status: DC

## 2014-06-18 ENCOUNTER — Encounter: Payer: Self-pay | Admitting: Cardiovascular Disease

## 2014-06-18 ENCOUNTER — Ambulatory Visit (INDEPENDENT_AMBULATORY_CARE_PROVIDER_SITE_OTHER): Payer: Medicare Other | Admitting: Cardiovascular Disease

## 2014-06-18 VITALS — BP 140/84 | HR 76 | Wt 143.0 lb

## 2014-06-18 DIAGNOSIS — R7303 Prediabetes: Secondary | ICD-10-CM

## 2014-06-18 DIAGNOSIS — I10 Essential (primary) hypertension: Secondary | ICD-10-CM

## 2014-06-18 DIAGNOSIS — I428 Other cardiomyopathies: Secondary | ICD-10-CM

## 2014-06-18 DIAGNOSIS — I4949 Other premature depolarization: Secondary | ICD-10-CM

## 2014-06-18 DIAGNOSIS — R7309 Other abnormal glucose: Secondary | ICD-10-CM

## 2014-06-18 NOTE — Progress Notes (Signed)
Patient ID: [name] [name], female   DOB: [birth year], 80 y.o.   MRN: [id number] [name] [name] is seen back today for a follow up visit.  Has not been seen by me  since January OF 2011. She has a history of DCM, presumed to be nonischemic with normal cath back in 2007. Other issues include asymptomatic PVCs, restless legs, HTN, anemia, depression and anxiety. Apparently with a remote positive bubble study for ASD.  Last echo 2014  Study Conclusions  - Left ventricle: Inferior hypokinesis. The cavity size was normal. Wall thickness was normal. Systolic function was normal. The estimated ejection fraction was in the range of 50% to 55%. Doppler parameters are consistent with abnormal left ventricular relaxation (grade 1 diastolic dysfunction). Doppler parameters are consistent with high ventricular filling pressure. - Right ventricle: The cavity size was normal. Systolic function was normal. - Pulmonary arteries: PA peak pressure: 55mm Hg (S). - Inferior vena cava: Poorly visualized. - Pericardium, extracardiac: A trivial pericardial effusion was identified posterior to the heart.       ROS: Denies fever, malais, weight loss, blurry vision, decreased visual acuity, cough, sputum, SOB, hemoptysis, pleuritic pain, palpitaitons, heartburn, abdominal pain, melena, lower extremity edema, claudication, or rash.  All other systems reviewed and negative  General: Affect appropriate Healthy:  appears stated age HEENT: normal Neck supple with no adenopathy JVP normal no bruits no thyromegaly Lungs clear with no wheezing and good diaphragmatic motion Heart:  S1/S2 no murmur, no rub, gallop or click PMI normal Abdomen: benighn, BS positve, no tenderness, no AAA no bruit.  No HSM or HJR Distal pulses intact with no bruits No edema Neuro non-focal Skin warm and dry No muscular weakness   Current Outpatient Prescriptions  Medication Sig Dispense Refill  . Acetaminophen (TYLENOL PO)  Take 500 mg by mouth daily.      Marland Kitchen aspirin 325 MG EC tablet Take 325 mg by mouth every evening.      Marland Kitchen buPROPion (WELLBUTRIN XL) 150 MG 24 hr tablet Take 450 mg by mouth every morning.       . carvedilol (COREG) 12.5 MG tablet TAKE ONE TABLET BY MOUTH TWICE DAILY   60 tablet  4  . clonazePAM (KLONOPIN) 0.5 MG tablet Take 1.5 mg by mouth at bedtime.      Marland Kitchen lisinopril-hydrochlorothiazide (PRINZIDE,ZESTORETIC) 20-12.5 MG per tablet Take two tablets by mouth daily  180 tablet  2  . losartan (COZAAR) 100 MG tablet TAKE ONE TABLET BY MOUTH ONE TIME DAILY  90 tablet  3  . mirtazapine (REMERON) 30 MG tablet Take 30 mg by mouth at bedtime.       . Multiple Vitamin (MULTIVITAMIN) tablet Take 1 tablet by mouth every morning.       . polyethylene glycol (MIRALAX / GLYCOLAX) packet Take 17 g by mouth 3 (three) times a week. Mon, wed, and fri      . predniSONE (DELTASONE) 10 MG tablet 3 tabs x 3 days, 2 tabs x 3 days, 1 tab x 3 days  20 tablet  0   No current facility-administered medications for this visit.    Allergies  Shellfish allergy; Codeine; Hydrocodone; and Adhesive  Electrocardiogram:  2/15  SR rate 73  Nonspecific ST changes   Assessment and Plan

## 2014-06-18 NOTE — Assessment & Plan Note (Signed)
Discussed low carb diet.  Target hemoglobin A1c is 6.5 or less.  Continue current medications.  

## 2014-06-18 NOTE — Assessment & Plan Note (Signed)
Stable Functional class one continue ACE and diuretic

## 2014-06-18 NOTE — Assessment & Plan Note (Signed)
Well controlled.  Continue current medications and low sodium Dash type diet.    

## 2014-06-18 NOTE — Patient Instructions (Signed)
Your physician recommends that you continue on your current medications as directed. Please refer to the Current Medication list given to you today.  Your physician wants you to follow-up in: 1 year with Dr. Nishan. You will receive a reminder letter in the mail two months in advance. If you don't receive a letter, please call our office to schedule the follow-up appointment.  

## 2014-06-18 NOTE — Assessment & Plan Note (Signed)
Asymptomatic None in exam room today stable

## 2014-07-27 ENCOUNTER — Other Ambulatory Visit: Payer: Self-pay

## 2014-07-27 MED ORDER — CARVEDILOL 12.5 MG PO TABS: ORAL_TABLET | ORAL | Status: DC

## 2014-08-03 ENCOUNTER — Encounter: Payer: Self-pay | Admitting: Internal Medicine

## 2014-08-12 ENCOUNTER — Encounter: Payer: Self-pay | Admitting: Internal Medicine

## 2014-08-14 ENCOUNTER — Telehealth: Payer: Self-pay | Admitting: Internal Medicine

## 2014-08-14 NOTE — Telephone Encounter (Signed)
Patient Information:  Caller Name: [name]  Phone: [phone]  Patient: [name] [name], [name] [name]  Gender: Female  DOB: Dec 23, 1926  Age: 80 Years  PCP: Willow Ora  Office Follow Up:  Does the office need to follow up with this patient?: No  Instructions For The Office: N/A  RN Note:  Pt requesting early appt today or [facility]  Symptoms  Reason For Call & Symptoms: Pt is calling and states that she fell; she was trying to pick up a piece of tissue and fell against toilet and hit right side of chest on toilet and hit right side of head  on the wall;  having pain to right side of chest wear she hit; denies severe pain; rates pain 4/10; no brusing noted; denies diff breathing;  denies any bumps to the hea  Reviewed Health History In EMR: Yes  Reviewed Medications In EMR: Yes  Reviewed Allergies In EMR: Yes  Reviewed Surgeries / Procedures: Yes  Date of Onset of Symptoms: 08/12/2014  Guideline(s) Used:  Chest Injury  Disposition Per Guideline:   See Today or Tomorrow in Office  Reason For Disposition Reached:   High-risk adult (e.g., age > 90, osteoporosis, chronic steroid use)  Advice Given:   Call Back If:  Swelling or bruise becomes over 2 inches (5 cm).  You become worse.  Patient Will Follow Care Advice:  YES  Pt requesting appt for Saturday but Saturday schedule is not open yet.  Instructed to call back after 1:00pm to schedule appt for tomorrow; will comply

## 2014-08-15 ENCOUNTER — Ambulatory Visit (INDEPENDENT_AMBULATORY_CARE_PROVIDER_SITE_OTHER): Payer: Medicare Other | Admitting: Family Medicine

## 2014-08-15 ENCOUNTER — Encounter: Payer: Self-pay | Admitting: Family Medicine

## 2014-08-15 VITALS — BP 160/88 | Wt 144.0 lb

## 2014-08-15 DIAGNOSIS — IMO0001 Reserved for inherently not codable concepts without codable children: Secondary | ICD-10-CM

## 2014-08-15 DIAGNOSIS — R0789 Other chest pain: Secondary | ICD-10-CM

## 2014-08-15 DIAGNOSIS — Z23 Encounter for immunization: Secondary | ICD-10-CM

## 2014-08-15 DIAGNOSIS — R030 Elevated blood-pressure reading, without diagnosis of hypertension: Secondary | ICD-10-CM

## 2014-08-15 NOTE — Patient Instructions (Signed)
Continue Tylenol as needed for pain. Make efforts at deep breathing to avoid infectious problem such as pneumonia Schedule followup with your primary provider within the next couple of weeks to reassess elevated blood pressure

## 2014-08-15 NOTE — Progress Notes (Signed)
Subjective:    Patient ID: [name] [name], female    DOB: 1926-11-26, 80 y.o.   MRN: [id number]  HPI Acute visit for Saturday clinic. [facility] and in the bathroom reaching over to pick up a piece of toilet tissue off the floor. She lost her balance and fell striking the right chest wall against part of the toilet and right forehead against the wall. No loss of consciousness. No headaches or confusion. No dizziness. She complains mostly of some right chest wall tenderness and occasional pain with deep breathing. No cough. No fevers or chills. No hemoptysis. She's taking Tylenol with mild relief. Pain is 4-5/10. Sleeping okay. Allergic to hydrocodone. She denies any extremity pain. She's had no cognitive changes and no focal neurologic symptoms. No recent orthostasis. No hx of syncope.  Past Medical History  Diagnosis Date  . Cardiomyopathy     had normal cath 2007 w/ EF 52%; normal EF per echo 04/2013  . Hx of colonic polyps   . Hypertension   . Osteopenia   . Anemia   . Osteoarthritis   . Anxiety and depression 07/16/2009  . ASD (atrial septal defect)      per Bubble study 2004   Past Surgical History  Procedure Laterality Date  . Tonsillectomy    . Back surgery  2000    due to fall  . Breast surgery      negative Bx    reports that she has never smoked. She has never used smokeless tobacco. She reports that she does not drink alcohol or use illicit drugs. family history includes Heart attack in her father; Heart disease in her mother; Hypertension in her other. There is no history of Diabetes or Cancer. Allergies  Allergen Reactions  . Shellfish Allergy Anaphylaxis  . Codeine   . Hydrocodone   . Adhesive [Tape] Rash      Review of Systems  Constitutional: Negative for fever and chills.  Respiratory: Negative for cough and shortness of breath.   Cardiovascular: Negative for chest pain and leg swelling.  Gastrointestinal: Negative for  abdominal pain.  Genitourinary: Positive for hematuria. Negative for dysuria.  Neurological: Negative for dizziness, seizures, syncope and headaches.  Psychiatric/Behavioral: Negative for confusion.       Objective:   Physical Exam  Constitutional: She is oriented to person, place, and time. She appears well-developed and well-nourished. No distress.  HENT:  Head: Normocephalic and atraumatic.  Neck: Neck supple.  Full range of motion with no spinal tenderness  Cardiovascular: Normal rate and regular rhythm.   Pulmonary/Chest: Effort normal and breath sounds normal. No respiratory distress. She has no wheezes. She has no rales.  Musculoskeletal: She exhibits no edema.  Full range of motion upper and lower extremities. She does not have any cervical or thoracic spinal tenderness. Right chest wall anterior is tender to palpation around the third rib. No visible ecchymosis.  Neurological: She is alert and oriented to person, place, and time. No cranial nerve deficit.  Psychiatric: She has a normal mood and affect. Her behavior is normal. Judgment and thought content normal.          Assessment & Plan:  Status post fall with right chest wall pain. Suspect contusion. We explained she could have rib fracture but presence or absence of this on x-ray would not change treatment. Her pain is relatively stable and controlled with Tylenol. We've not recommended further medications at her age and also prior intolerance of  hydrocodone-since pain relatively mild. She is encouraged to do some deep breathing to avoid atelectasis or pneumonia  Elevated blood pressure. She has history of hypertension and states she is compliant with medications. She is encouraged to schedule followup with primary within the next couple weeks to reassess  Health maintenance. Patient requesting flu vaccine

## 2014-09-14 ENCOUNTER — Encounter: Payer: Self-pay | Admitting: Internal Medicine

## 2014-09-14 ENCOUNTER — Ambulatory Visit (INDEPENDENT_AMBULATORY_CARE_PROVIDER_SITE_OTHER): Payer: Medicare Other | Admitting: Internal Medicine

## 2014-09-14 VITALS — BP 154/85 | HR 67 | Temp 98.3°F | Wt 143.5 lb

## 2014-09-14 DIAGNOSIS — F418 Other specified anxiety disorders: Secondary | ICD-10-CM

## 2014-09-14 DIAGNOSIS — M159 Polyosteoarthritis, unspecified: Secondary | ICD-10-CM

## 2014-09-14 DIAGNOSIS — M150 Primary generalized (osteo)arthritis: Secondary | ICD-10-CM

## 2014-09-14 DIAGNOSIS — I10 Essential (primary) hypertension: Secondary | ICD-10-CM

## 2014-09-14 DIAGNOSIS — F419 Anxiety disorder, unspecified: Secondary | ICD-10-CM

## 2014-09-14 DIAGNOSIS — F329 Major depressive disorder, single episode, unspecified: Secondary | ICD-10-CM

## 2014-09-14 DIAGNOSIS — D649 Anemia, unspecified: Secondary | ICD-10-CM | POA: Insufficient documentation

## 2014-09-14 LAB — BASIC METABOLIC PANEL
BUN: 30 mg/dL — AB (ref 6–23)
CALCIUM: 10.1 mg/dL (ref 8.4–10.5)
CO2: 20 mEq/L (ref 19–32)
Chloride: 102 mEq/L (ref 96–112)
Creatinine, Ser: 1.4 mg/dL — ABNORMAL HIGH (ref 0.4–1.2)
GFR: 46.32 mL/min — ABNORMAL LOW (ref >60.00)
Glucose, Bld: 89 mg/dL (ref 70–99)
POTASSIUM: 3.7 meq/L (ref 3.5–5.1)
Sodium: 136 mEq/L (ref 135–145)

## 2014-09-14 NOTE — Assessment & Plan Note (Signed)
Continue with pain around the left hip, DJD? Trochanteric bursitis?. Symptoms relatively well controlled with Tylenol bid prn  but patient is still concerned. Plan: Refer to sports medicine

## 2014-09-14 NOTE — Assessment & Plan Note (Signed)
BP today slightly elevated, for now we will continue with losartan, lisinopril, HCTZ, carvedilol on monitor BPs at home. Will call if not well controlled.

## 2014-09-14 NOTE — Progress Notes (Signed)
Pre visit review using our clinic review tool, if applicable. No additional management support is needed unless otherwise documented below in the visit note. 

## 2014-09-14 NOTE — Patient Instructions (Signed)
Get your blood work before you leave   Check the  blood pressure 2 or 3 times a   Week  Be sure your blood pressure is between  145/85  and 110/65.  if it is consistently higher or lower, let me know   Please come back to the office by 4 -2016  for a physical exam. Come back fasting

## 2014-09-14 NOTE — Assessment & Plan Note (Signed)
Symptoms apparently well controlled, follow-up by Dr. Donell Beers

## 2014-09-14 NOTE — Progress Notes (Signed)
Subjective:    Patient ID: [name] HutchingVirginia M [name], female    DOB: 1927/09/19, 80 y.o.   MRN: [id number]  DOS:  09/14/2014 Type of visit - description : follow-up Interval history: Cardiomyopathy, saw cardiology 2 months ago, note reviewed,   felt to be stable Hypertension, good medication compliance, BP slightly elevated today,states that at home when she checks his usually "low", could not tell me her readings, she did not feel poorly when the blood pressure was low. Anxiety depression, symptoms well-controlled, good medication compliance. Had a fall a few weeks ago, injured her chest, pain is better DJD, still has pain around the left hip posteriorly and anteriorly, on and off, mostly with walking. Otherwise states she feels "great"   ROS Denies nausea, vomiting, diarrhea blood in the stools No dysuria, gross hematuria difficulty urinating.   Past Medical History  Diagnosis Date  . Cardiomyopathy     had normal cath 2007 w/ EF 52%; normal EF per echo 04/2013  . Hx of colonic polyps   . Hypertension   . Osteopenia   . Anemia   . Osteoarthritis   . Anxiety and depression 07/16/2009  . ASD (atrial septal defect)      per Bubble study 2004    Past Surgical History  Procedure Laterality Date  . Tonsillectomy    . Back surgery  2000    due to fall  . Breast surgery      negative Bx    History   Social History  . Marital Status: Widowed    Spouse Name: N/A    Number of Children: 1  . Years of Education: N/A   Occupational History  . retired--Motivational Speaker    Social History Main Topics  . Smoking status: Never Smoker   . Smokeless tobacco: Never Used  . Alcohol Use: No  . Drug Use: No  . Sexual Activity: Not Currently   Other Topics Concern  . Not on file   Social History Narrative   [place]   Has a daughter [name] PortsValerie and a G-d in [place]; lost her son-in-law, moved w/ daughter 2014 due to depression  although she still drives and is  independent for the most par, volunteers at church               Medication List       This list is accurate as of: 09/14/14  6:54 PM.  Always use your most recent med list.               aspirin 325 MG EC tablet  Take 325 mg by mouth every evening.     buPROPion 150 MG 24 hr tablet  Commonly known as:  WELLBUTRIN XL  Take 450 mg by mouth every morning.     carvedilol 12.5 MG tablet  Commonly known as:  COREG  TAKE ONE TABLET BY MOUTH TWICE DAILY     clonazePAM 0.5 MG tablet  Commonly known as:  KLONOPIN  Take 1.5 mg by mouth at bedtime.     cycloSPORINE 0.05 % ophthalmic emulsion  Commonly known as:  RESTASIS  Place 1 drop into both eyes 2 (two) times daily.     lisinopril-hydrochlorothiazide 20-12.5 MG per tablet  Commonly known as:  PRINZIDE,ZESTORETIC  Take two tablets by mouth daily     losartan 100 MG tablet  Commonly known as:  COZAAR  TAKE ONE TABLET BY MOUTH ONE TIME DAILY     mirtazapine 30 MG  tablet  Commonly known as:  REMERON  Take 30 mg by mouth at bedtime.     multivitamin tablet  Take 1 tablet by mouth every morning.     polyethylene glycol packet  Commonly known as:  MIRALAX / GLYCOLAX  Take 17 g by mouth 3 (three) times a week. Mon, wed, and fri     TYLENOL PO  Take 500 mg by mouth daily.           Objective:   Physical Exam BP 154/85 mmHg  Pulse 67  Temp(Src) 98.3 F (36.8 C) (Oral)  Wt 143 lb 8 oz (65.091 kg)  SpO2 100%  General -- alert, well-developed, NAD.   Lungs -- normal respiratory effort, no intercostal retractions, no accessory muscle use, and normal breath sounds.  Heart-- normal rate, regular rhythm, no murmur.  Extremities-- no pretibial edema bilaterally ; gait normal, range of motion of the hips normal, again slightly tender at the left trochanteric bursa Neurologic--  alert & oriented X3. Speech normal,   strength symmetric and appropriate for age.  Psych-- Cognition and judgment appear intact. Cooperative  with normal attention span and concentration. No anxious or depressed appearing.     Assessment & Plan:

## 2014-09-23 ENCOUNTER — Encounter: Payer: Self-pay | Admitting: Family Medicine

## 2014-09-23 ENCOUNTER — Ambulatory Visit (INDEPENDENT_AMBULATORY_CARE_PROVIDER_SITE_OTHER): Payer: Medicare Other | Admitting: Family Medicine

## 2014-09-23 VITALS — BP 145/84 | HR 73 | Ht 65.0 in | Wt 144.0 lb

## 2014-09-23 DIAGNOSIS — M25562 Pain in left knee: Secondary | ICD-10-CM

## 2014-09-23 DIAGNOSIS — M25552 Pain in left hip: Secondary | ICD-10-CM

## 2014-09-23 NOTE — Patient Instructions (Signed)
Your knee pain is due to arthritis. Take tylenol 500mg  1-2 tabs three times a day for pain. Consider Aleve 1-2 tabs twice a day with food Do NOT take glucosamine because you are allergic to shellfish. Capsaicin topically up to four times a day may also help with pain (aspercreme and biofreeze are other topical medicines you can try). Cortisone injections are an option - let me know if you want to go ahead with this. If cortisone injections do not help, there are different types of shots that may help but they take longer to take effect. It's important that you continue to stay active. Straight leg raises, knee extensions 3 sets of 10 once a day (add ankle weight if these become too easy). Consider physical therapy to strengthen muscles around the joint that hurts to take pressure off of the joint itself. Shoe inserts with good arch support may be helpful. Heat or ice 15 minutes at a time 3-4 times a day as needed to help with pain. Water aerobics and cycling with low resistance are the best two types of exercise for arthritis.  You have trochanteric bursitis of your hip. Avoid painful activities as much as possible. Ice over area of pain 3-4 times a day for 15 minutes at a time Hip side raises 3 sets of 10 once a day.  Use a 2 pound ankle weight when you're doing these. Stretches - pick 2 and hold for 20-30 seconds x 3 - do once or twice a day. Tylenol and/or aleve as needed for pain. If not improving, can consider formal physical therapy and/or steroid injection. Follow up with me in 5-6 weeks for both issues.

## 2014-09-24 DIAGNOSIS — M25552 Pain in left hip: Secondary | ICD-10-CM | POA: Insufficient documentation

## 2014-09-24 DIAGNOSIS — M25562 Pain in left knee: Secondary | ICD-10-CM | POA: Insufficient documentation

## 2014-09-24 NOTE — Assessment & Plan Note (Signed)
2/2 DJD.  Discussed tylenol, nsaids, capsaicin.  Consider injection.  HEP reviewed.  Consider physical therapy.  F/u 5-6 weeks.

## 2014-09-24 NOTE — Assessment & Plan Note (Signed)
2/2 trochanteric bursitis.  Shown home exercises and stretches to do daily.  Icing, tylenol or aleve if needed.  Consider cortisone injection - declined today.

## 2014-09-24 NOTE — Progress Notes (Signed)
PCP: Willow OraJose Paz, MD  Subjective:   HPI: Patient is a 80 y.o. female here for left hip and left knee pain.  Patient reports her left hip has been hurting for about 2 months. Worse with walking. [facility] but pain started before this. Worse on lateral hip. Hurts to lie down on this side. Takes tylenol - 1 in the morning, 1 in the evening. Left knee has been hurting about 1 week - just woke up with pain. No prior injuries. No catching, locking, giving out.  Past Medical History  Diagnosis Date  . Cardiomyopathy     had normal cath 2007 w/ EF 52%; normal EF per echo 04/2013  . Hx of colonic polyps   . Hypertension   . Osteopenia   . Anemia   . Osteoarthritis   . Anxiety and depression 07/16/2009  . ASD (atrial septal defect)      per Bubble study 2004    Current Outpatient Prescriptions on File Prior to Visit  Medication Sig Dispense Refill  . Acetaminophen (TYLENOL PO) Take 500 mg by mouth daily.    Marland Kitchen. aspirin 325 MG EC tablet Take 325 mg by mouth every evening.    Marland Kitchen. buPROPion (WELLBUTRIN XL) 150 MG 24 hr tablet Take 450 mg by mouth every morning.     . carvedilol (COREG) 12.5 MG tablet TAKE ONE TABLET BY MOUTH TWICE DAILY 60 tablet 2  . clonazePAM (KLONOPIN) 0.5 MG tablet Take 1.5 mg by mouth at bedtime.    . cycloSPORINE (RESTASIS) 0.05 % ophthalmic emulsion Place 1 drop into both eyes 2 (two) times daily.    Marland Kitchen. lisinopril-hydrochlorothiazide (PRINZIDE,ZESTORETIC) 20-12.5 MG per tablet Take two tablets by mouth daily 180 tablet 2  . losartan (COZAAR) 100 MG tablet TAKE ONE TABLET BY MOUTH ONE TIME DAILY 90 tablet 3  . mirtazapine (REMERON) 30 MG tablet Take 30 mg by mouth at bedtime.     . Multiple Vitamin (MULTIVITAMIN) tablet Take 1 tablet by mouth every morning.     . polyethylene glycol (MIRALAX / GLYCOLAX) packet Take 17 g by mouth 3 (three) times a week. Mon, wed, and fri     No current facility-administered medications on file prior  to visit.    Past Surgical History  Procedure Laterality Date  . Tonsillectomy    . Back surgery  2000    due to fall  . Breast surgery      negative Bx    Allergies  Allergen Reactions  . Shellfish Allergy Anaphylaxis  . Codeine   . Hydrocodone   . Adhesive [Tape] Rash    History   Social History  . Marital Status: Widowed    Spouse Name: N/A    Number of Children: 1  . Years of Education: N/A   Occupational History  . retired--Motivational Speaker    Social History Main Topics  . Smoking status: Never Smoker   . Smokeless tobacco: Never Used  . Alcohol Use: No  . Drug Use: No  . Sexual Activity: Not Currently   Other Topics Concern  . Not on file   Social History Narrative   [place]   Has a daughter Vikki PortsValerie and a G-d in [place]; lost her son-in-law, moved w/ daughter 2014 due to depression  although she still drives and is independent for the most par, volunteers at church           Family History  Problem Relation Age of Onset  . Diabetes Neg Hx   . Cancer Neg Hx     colon or breast  . Heart attack Father     F and 2 brothers (no early onset)  . Hypertension Other   . Heart disease Mother     BP 145/84 mmHg  Pulse 73  Ht 5\' 5"  (1.651 m)  Wt 144 lb (65.318 kg)  BMI 23.96 kg/m2  Review of Systems: See HPI above.    Objective:  Physical Exam:  Gen: NAD  Left hip: No gross deformity, scoliosis. TTP greater trochanter.  No other hip or back tenderness. FROM without pain. Strength LEs 5/5 all muscle groups except 4/5 hip abduction.   Negative SLRs. Negative logroll bilateral hips  Left knee: No gross deformity, ecchymoses.  Mild effusion. Mild TTP medial joint line, post patellar facets.  No other tenderness. FROM. Negative ant/post drawers. Negative valgus/varus testing. Negative lachmanns. Negative mcmurrays, apleys, patellar apprehension. NV intact distally.    Assessment & Plan:  1. Left hip pain - 2/2  trochanteric bursitis.  Shown home exercises and stretches to do daily.  Icing, tylenol or aleve if needed.  Consider cortisone injection - declined today.  2. Left knee pain - 2/2 DJD.  Discussed tylenol, nsaids, capsaicin.  Consider injection.  HEP reviewed.  Consider physical therapy.  F/u 5-6 weeks.

## 2014-10-29 ENCOUNTER — Ambulatory Visit: Payer: Medicare Other | Admitting: Family Medicine

## 2014-11-17 ENCOUNTER — Ambulatory Visit (INDEPENDENT_AMBULATORY_CARE_PROVIDER_SITE_OTHER): Payer: 59 | Admitting: Family Medicine

## 2014-11-17 ENCOUNTER — Encounter: Payer: Self-pay | Admitting: Family Medicine

## 2014-11-17 VITALS — BP 166/95 | HR 73 | Ht 65.0 in | Wt 144.0 lb

## 2014-11-17 DIAGNOSIS — M25562 Pain in left knee: Secondary | ICD-10-CM

## 2014-11-17 DIAGNOSIS — M25552 Pain in left hip: Secondary | ICD-10-CM

## 2014-11-17 NOTE — Patient Instructions (Signed)
Continue with home exercises. Do stretches and exercises for your knee 3 times a week for next 6 weeks. I would continue to do the hip side raises 3 sets of 10 every day though. Follow up with me as needed.

## 2014-11-18 NOTE — Assessment & Plan Note (Signed)
2/2 DJD.  Continue HEP, aspercreme as needed.  Consider injection, PT if not improving.  F/u prn.

## 2014-11-18 NOTE — Assessment & Plan Note (Signed)
2/2 trochanteric bursitis.  Much improved.  Continue home exercises especially hip abduction as this is still weak.  Icing, tylenol or aleve if needed.  Consider cortisone injection, physical therapy if not improving. F/u prn.

## 2014-11-18 NOTE — Progress Notes (Signed)
PCP: Willow Ora, MD  Subjective:   HPI: Patient is a 81 y.o. female here for left hip and left knee pain.  09/23/14: Patient reports her left hip has been hurting for about 2 months. Worse with walking. Did have a fall in restroom at [facility] last month but pain started before this. Worse on lateral hip. Hurts to lie down on this side. Takes tylenol - 1 in the morning, 1 in the evening. Left knee has been hurting about 1 week - just woke up with pain. No prior injuries. No catching, locking, giving out.  11/17/14: Patient reports she feels significantly better. Doing home exercises, occasionally using aspercreme. Doesn't need tylenol, nsaids, capsaicin. Heat as needed as well. No other complaints in hip or knee.   Past Medical History  Diagnosis Date  . Cardiomyopathy     had normal cath 2007 w/ EF 52%; normal EF per echo 04/2013  . Hx of colonic polyps   . Hypertension   . Osteopenia   . Anemia   . Osteoarthritis   . Anxiety and depression 07/16/2009  . ASD (atrial septal defect)      per Bubble study 2004    Current Outpatient Prescriptions on File Prior to Visit  Medication Sig Dispense Refill  . Acetaminophen (TYLENOL PO) Take 500 mg by mouth daily.    Marland Kitchen aspirin 325 MG EC tablet Take 325 mg by mouth every evening.    Marland Kitchen buPROPion (WELLBUTRIN XL) 150 MG 24 hr tablet Take 450 mg by mouth every morning.     . carvedilol (COREG) 12.5 MG tablet TAKE ONE TABLET BY MOUTH TWICE DAILY 60 tablet 2  . clonazePAM (KLONOPIN) 0.5 MG tablet Take 1.5 mg by mouth at bedtime.    . cycloSPORINE (RESTASIS) 0.05 % ophthalmic emulsion Place 1 drop into both eyes 2 (two) times daily.    Marland Kitchen lisinopril-hydrochlorothiazide (PRINZIDE,ZESTORETIC) 20-12.5 MG per tablet Take two tablets by mouth daily 180 tablet 2  . losartan (COZAAR) 100 MG tablet TAKE ONE TABLET BY MOUTH ONE TIME DAILY 90 tablet 3  . mirtazapine (REMERON) 30 MG tablet Take 30 mg by mouth at bedtime.     . Multiple Vitamin  (MULTIVITAMIN) tablet Take 1 tablet by mouth every morning.     . polyethylene glycol (MIRALAX / GLYCOLAX) packet Take 17 g by mouth 3 (three) times a week. Mon, wed, and fri     No current facility-administered medications on file prior to visit.    Past Surgical History  Procedure Laterality Date  . Tonsillectomy    . Back surgery  2000    due to fall  . Breast surgery      negative Bx    Allergies  Allergen Reactions  . Shellfish Allergy Anaphylaxis  . Codeine   . Hydrocodone   . Adhesive [Tape] Rash    History   Social History  . Marital Status: Widowed    Spouse Name: N/A    Number of Children: 1  . Years of Education: N/A   Occupational History  . retired--Motivational Speaker    Social History Main Topics  . Smoking status: Never Smoker   . Smokeless tobacco: Never Used  . Alcohol Use: No  . Drug Use: No  . Sexual Activity: Not Currently   Other Topics Concern  . Not on file   Social History Narrative   Widow, moved to [facility] [place].   Has a daughter Vikki Ports and a G-d in [place]; lost her son-in-law, moved w/  daughter 2014 due to depression  although she still drives and is independent for the most par, volunteers at church           Family History  Problem Relation Age of Onset  . Diabetes Neg Hx   . Cancer Neg Hx     colon or breast  . Heart attack Father     F and 2 brothers (no early onset)  . Hypertension Other   . Heart disease Mother     BP 166/95 mmHg  Pulse 73  Ht 5\' 5"  (1.651 m)  Wt 144 lb (65.318 kg)  BMI 23.96 kg/m2  Review of Systems: See HPI above.    Objective:  Physical Exam:  Gen: NAD  Left hip: No gross deformity, scoliosis. Minimal TTP greater trochanter.  No other hip or back tenderness. FROM without pain. Strength LEs 5/5 all muscle groups except 4/5 hip abduction.   Negative SLRs. Negative logroll bilateral hips  Left knee: No gross deformity, ecchymoses.  Mild effusion. No TTP medial joint line, post patellar  facets.  No other tenderness. FROM. Negative ant/post drawers. Negative valgus/varus testing. Negative lachmanns. Negative mcmurrays, apleys, patellar apprehension. NV intact distally.    Assessment & Plan:  1. Left hip pain - 2/2 trochanteric bursitis.  Much improved.  Continue home exercises especially hip abduction as this is still weak.  Icing, tylenol or aleve if needed.  Consider cortisone injection, physical therapy if not improving. F/u prn.  2. Left knee pain - 2/2 DJD.  Continue HEP, aspercreme as needed.  Consider injection, PT if not improving.  F/u prn.

## 2014-11-19 LAB — HM MAMMOGRAPHY: HM MAMMO: NORMAL

## 2014-11-23 ENCOUNTER — Encounter: Payer: Self-pay | Admitting: *Deleted

## 2014-11-24 ENCOUNTER — Other Ambulatory Visit: Payer: Self-pay

## 2014-11-24 MED ORDER — CARVEDILOL 12.5 MG PO TABS: ORAL_TABLET | ORAL | Status: DC

## 2014-11-26 ENCOUNTER — Telehealth: Payer: Self-pay | Admitting: *Deleted

## 2014-11-26 NOTE — Telephone Encounter (Signed)
I called her back.  She was asking about a patient, which I cannot give out that information. She asked for our zip code and I did given her the zip code for our office.

## 2014-11-26 NOTE — Telephone Encounter (Signed)
Pt called and said "she would appreciate a call back" (??????)

## 2014-12-22 ENCOUNTER — Other Ambulatory Visit: Payer: Self-pay

## 2014-12-22 MED ORDER — LOSARTAN POTASSIUM 100 MG PO TABS: ORAL_TABLET | ORAL | Status: DC

## 2015-02-25 ENCOUNTER — Telehealth: Payer: Self-pay | Admitting: Internal Medicine

## 2015-02-25 NOTE — Telephone Encounter (Signed)
Pre Visit Letter Sent °

## 2015-03-15 ENCOUNTER — Encounter: Payer: Self-pay | Admitting: *Deleted

## 2015-03-15 ENCOUNTER — Telehealth: Payer: Self-pay | Admitting: *Deleted

## 2015-03-15 NOTE — Telephone Encounter (Signed)
Pre-Visit Call completed with daughter and chart updated.   Pre-Visit Info documented in Specialty Comments under [employer].

## 2015-03-16 ENCOUNTER — Ambulatory Visit (INDEPENDENT_AMBULATORY_CARE_PROVIDER_SITE_OTHER): Payer: Medicare Other | Admitting: Internal Medicine

## 2015-03-16 ENCOUNTER — Encounter: Payer: Self-pay | Admitting: Internal Medicine

## 2015-03-16 ENCOUNTER — Other Ambulatory Visit: Payer: Self-pay

## 2015-03-16 VITALS — BP 128/78 | HR 81 | Temp 98.1°F | Ht 65.0 in | Wt 140.4 lb

## 2015-03-16 DIAGNOSIS — Z0000 Encounter for general adult medical examination without abnormal findings: Secondary | ICD-10-CM | POA: Diagnosis not present

## 2015-03-16 DIAGNOSIS — Z136 Encounter for screening for cardiovascular disorders: Secondary | ICD-10-CM

## 2015-03-16 DIAGNOSIS — D649 Anemia, unspecified: Secondary | ICD-10-CM

## 2015-03-16 DIAGNOSIS — R7309 Other abnormal glucose: Secondary | ICD-10-CM

## 2015-03-16 DIAGNOSIS — R7303 Prediabetes: Secondary | ICD-10-CM

## 2015-03-16 DIAGNOSIS — I10 Essential (primary) hypertension: Secondary | ICD-10-CM

## 2015-03-16 MED ORDER — LISINOPRIL-HYDROCHLOROTHIAZIDE 20-12.5 MG PO TABS: 2.0000 | ORAL_TABLET | Freq: Every day | ORAL | Status: DC

## 2015-03-16 MED ORDER — LOSARTAN POTASSIUM 100 MG PO TABS: 100.0000 mg | ORAL_TABLET | Freq: Every day | ORAL | Status: DC

## 2015-03-16 MED ORDER — CARVEDILOL 12.5 MG PO TABS: 12.5000 mg | ORAL_TABLET | Freq: Two times a day (BID) | ORAL | Status: DC

## 2015-03-16 NOTE — Progress Notes (Signed)
Pre visit review using our clinic review tool, if applicable. No additional management support is needed unless otherwise documented below in the visit note. 

## 2015-03-16 NOTE — Assessment & Plan Note (Signed)
We'll check A1c, cholesterol panel. Continue with healthy lifestyle

## 2015-03-16 NOTE — Patient Instructions (Signed)
Get your blood work before you leave     Come back to the office for 6 months   for a routine check up     Fall Prevention and Home Safety Falls cause injuries and can affect all age groups. It is possible to use preventive measures to significantly decrease the likelihood of falls. There are many simple measures which can make your home safer and prevent falls. OUTDOORS  Repair cracks and edges of walkways and driveways.  Remove high doorway thresholds.  Trim shrubbery on the main path into your home.  Have good outside lighting.  Clear walkways of tools, rocks, debris, and clutter.  Check that handrails are not broken and are securely fastened. Both sides of steps should have handrails.  Have leaves, snow, and ice cleared regularly.  Use sand or salt on walkways during winter months.  In the garage, clean up grease or oil spills. BATHROOM  Install night lights.  Install grab bars by the toilet and in the tub and shower.  Use non-skid mats or decals in the tub or shower.  Place a plastic non-slip stool in the shower to sit on, if needed.  Keep floors dry and clean up all water on the floor immediately.  Remove soap buildup in the tub or shower on a regular basis.  Secure bath mats with non-slip, double-sided rug tape.  Remove throw rugs and tripping hazards from the floors. BEDROOMS  Install night lights.  Make sure a bedside light is easy to reach.  Do not use oversized bedding.  Keep a telephone by your bedside.  Have a firm chair with side arms to use for getting dressed.  Remove throw rugs and tripping hazards from the floor. KITCHEN  Keep handles on pots and pans turned toward the center of the stove. Use back burners when possible.  Clean up spills quickly and allow time for drying.  Avoid walking on wet floors.  Avoid hot utensils and knives.  Position shelves so they are not too high or low.  Place commonly used objects within easy  reach.  If necessary, use a sturdy step stool with a grab bar when reaching.  Keep electrical cables out of the way.  Do not use floor polish or wax that makes floors slippery. If you must use wax, use non-skid floor wax.  Remove throw rugs and tripping hazards from the floor. STAIRWAYS  Never leave objects on stairs.  Place handrails on both sides of stairways and use them. Fix any loose handrails. Make sure handrails on both sides of the stairways are as long as the stairs.  Check carpeting to make sure it is firmly attached along stairs. Make repairs to worn or loose carpet promptly.  Avoid placing throw rugs at the top or bottom of stairways, or properly secure the rug with carpet tape to prevent slippage. Get rid of throw rugs, if possible.  Have an electrician put in a light switch at the top and bottom of the stairs. OTHER FALL PREVENTION TIPS  Wear low-heel or rubber-soled shoes that are supportive and fit well. Wear closed toe shoes.  When using a stepladder, make sure it is fully opened and both spreaders are firmly locked. Do not climb a closed stepladder.  Add color or contrast paint or tape to grab bars and handrails in your home. Place contrasting color strips on first and last steps.  Learn and use mobility aids as needed. Install an electrical emergency response system.  Turn on  Turn on lights to avoid dark areas. Replace light bulbs that burn out immediately. Get light switches that glow.  Arrange furniture to create clear pathways. Keep furniture in the same place.  Firmly attach carpet with non-skid or double-sided tape.  Eliminate uneven floor surfaces.  Select a carpet pattern that does not visually hide the edge of steps.  Be aware of all pets. OTHER HOME SAFETY TIPS  Set the water temperature for 120 F (48.8 C).  Keep emergency numbers on or near the telephone.  Keep smoke detectors on every level of the home and near sleeping areas. Document Released:  10/20/2002 Document Revised: 04/30/2012 Document Reviewed: 01/19/2012 ExitCare Patient Information 2015 [employer]. This information is not intended to replace advice given to you by your health care provider. Make sure you discuss any questions you have with your health care provider.   Preventive Care for Adults Ages 65 and over  Blood pressure check.** / Every 1 to 2 years.  Lipid and cholesterol check.**/ Every 5 years beginning at age 20.  Lung cancer screening. / Every year if you are aged 55-80 years and have a 30-pack-year history of smoking and currently smoke or have quit within the past 15 years. Yearly screening is stopped once you have quit smoking for at least 15 years or develop a health problem that would prevent you from having lung cancer treatment.  Fecal occult blood test (FOBT) of stool. / Every year beginning at age 50 and continuing until age 75. You may not have to do this test if you get a colonoscopy every 10 years.  Flexible sigmoidoscopy** or colonoscopy.** / Every 5 years for a flexible sigmoidoscopy or every 10 years for a colonoscopy beginning at age 50 and continuing until age 75.  Hepatitis C blood test.** / For all people born from 1945 through 1965 and any individual with known risks for hepatitis C.  Abdominal aortic aneurysm (AAA) screening.** / A one-time screening for ages 65 to 75 years who are current or former smokers.  Skin self-exam. / Monthly.  Influenza vaccine. / Every year.  Tetanus, diphtheria, and acellular pertussis (Tdap/Td) vaccine.** / 1 dose of Td every 10 years.  Varicella vaccine.** / Consult your health care provider.  Zoster vaccine.** / 1 dose for adults aged 60 years or older.  Pneumococcal 13-valent conjugate (PCV13) vaccine.** / Consult your health care provider.  Pneumococcal polysaccharide (PPSV23) vaccine.** / 1 dose for all adults aged 65 years and older.  Meningococcal vaccine.** / Consult your health care  provider.  Hepatitis A vaccine.** / Consult your health care provider.  Hepatitis B vaccine.** / Consult your health care provider.  Haemophilus influenzae type b (Hib) vaccine.** / Consult your health care provider. **Family history and personal history of risk and conditions may change your health care provider's recommendations. Document Released: 12/26/2001 Document Revised: 11/04/2013 Document Reviewed: 03/27/2011 ExitCare Patient Information 2015 [employer]. This information is not intended to replace advice given to you by your health care provider. Make sure you discuss any questions you have with your health care provider.   

## 2015-03-16 NOTE — Assessment & Plan Note (Addendum)
Td 2-08, shingles shot 2009, pneumonia shot 1-08 prevnar 2015  Has yearly MMGs, Last month 11-2014 negative,  Guidelines and rationale behind them discussed with the patient, we are agreed on no further breast cancer screening. No  further Paps, see previous entries   s/p Cscope Dr Randa Evens 2006, and 10- 2011, no further scopes   Diet and exercise discussed  question of palpable aorta, get ultrasound to rule out AAA

## 2015-03-16 NOTE — Assessment & Plan Note (Addendum)
check a hemoglobin, iron and ferritin

## 2015-03-16 NOTE — Progress Notes (Signed)
Subjective:    Patient ID: [name] [name], female    DOB: 10-20-1927, 81 y.o.   MRN: [id number]  DOS:  03/16/2015 Type of visit - description :  Here for Medicare AWV: 1.Risk factors based on Past M, S, F history: reviewed  2.Physical Activities: still goes to the "[facility]" 2-3 /week  3.Depression/mood: (-) screening, well-controlled on medications 4.Hearing: to get a hearing aid this week  5.ADL's: totally independent, still drives  6.Fall Risk: no falls this year , prevention discusses 7.Home Safety: does feel safe ar home  8. Height, weight, &visual acuity: see VS, vision well corrected w/ glasses , has the dx of pre-glaucoma, sees eye MD q 6 months 9.Counseling: yes, see a/p  10.Labs ordered based on risk factors: yes  11. Referral Coordination: yes , see below  12.Care Plan:see a/p , written plan provided , see AVS 13.Cognitive Assessment: motor skills appropriate for age, memory and cognition seems > average for age. 14. Care team updated, see snap shot  15. End-of-life care discussed, she already had that conversation w/ her family  in addition we discussed the following issues hypertension, goodcompliance with medications,BP today is very good. DJD-- saw sports meds, stable , pain not an issue at this time   Review of Systems Constitutional: No fever, chills. No unexplained wt changes. No unusual sweats HEENT: No dental problems, ear discharge, facial swelling, voice changes. No eye discharge, redness or intolerance to light Respiratory: No wheezing or difficulty breathing. No cough , mucus production Cardiovascular: No CP, leg swelling or palpitations GI: no nausea, vomiting, diarrhea or abdominal pain.  No blood in the stools. No dysphagia   Endocrine: No polyphagia, polyuria or polydipsia GU: No dysuria, gross hematuria, difficulty urinating. No urinary urgency or frequency. Musculoskeletal: No joint swellings or unusual aches or  pains Skin: No change in the color of the skin, palor or rash Allergic, immunologic: No environmental allergies or food allergies Neurological: No dizziness or syncope. No headaches. No diplopia, slurred speech, motor deficits, facial numbness Hematological: No enlarged lymph nodes, easy bruising or bleeding Psychiatry: No suicidal ideas, hallucinations, behavior problems or confusion. No unusual/severe anxiety or depression.     Past Medical History  Diagnosis Date  . Cardiomyopathy     had normal cath 2007 w/ EF 52%; normal EF per echo 04/2013  . Hx of colonic polyps   . Hypertension   . Osteopenia   . Anemia   . Osteoarthritis   . Anxiety and depression 07/16/2009  . ASD (atrial septal defect)      per Bubble study 2004    Past Surgical History  Procedure Laterality Date  . Tonsillectomy    . Back surgery  2000    due to fall  . Breast surgery      negative Bx    History   Social History  . Marital Status: Widowed    Spouse Name: N/A  . Number of Children: 1  . Years of Education: N/A   Occupational History  . retired--Motivational Speaker    Social History Main Topics  . Smoking status: Never Smoker   . Smokeless tobacco: Never Used  . Alcohol Use: No  . Drug Use: No  . Sexual Activity: Not Currently   Other Topics Concern  . Not on file   Social History Narrative   Widow, moved to [facility] 2007.   Has a daughter Vikki Ports and a G-d in [place]; lost her son-in-law, moved w/ daughter 2014 due to depression  Family History  Problem Relation Age of Onset  . Diabetes Neg Hx   . Cancer Neg Hx     colon or breast  . Heart attack Father     F and 2 brothers (no early onset)  . Hypertension Other   . Heart disease Mother        Medication List       This list is accurate as of: 03/16/15 11:59 PM.  Always use your most recent med list.               aspirin 325 MG EC tablet  Take 325 mg by mouth every evening.     buPROPion 150 MG 24 hr tablet   Commonly known as:  WELLBUTRIN XL  Take 450 mg by mouth every morning.     carvedilol 12.5 MG tablet  Commonly known as:  COREG  Take 1 tablet (12.5 mg total) by mouth 2 (two) times daily with a meal.     clonazePAM 0.5 MG tablet  Commonly known as:  KLONOPIN  Take 1.5 mg by mouth at bedtime.     cycloSPORINE 0.05 % ophthalmic emulsion  Commonly known as:  RESTASIS  Place 1 drop into both eyes 2 (two) times daily.     lisinopril-hydrochlorothiazide 20-12.5 MG per tablet  Commonly known as:  PRINZIDE,ZESTORETIC  Take 2 tablets by mouth daily.     losartan 100 MG tablet  Commonly known as:  COZAAR  Take 1 tablet (100 mg total) by mouth daily.     mirtazapine 30 MG tablet  Commonly known as:  REMERON  Take 30 mg by mouth at bedtime.     multivitamin tablet  Take 1 tablet by mouth every morning.     polyethylene glycol packet  Commonly known as:  MIRALAX / GLYCOLAX  Take 17 g by mouth 3 (three) times a week. Mon, wed, and fri     TYLENOL PO  Take 500 mg by mouth daily.           Objective:   Physical Exam BP 128/78 mmHg  Pulse 81  Temp(Src) 98.1 F (36.7 C) (Oral)  Ht 5\' 5"  (1.651 m)  Wt 140 lb 6 oz (63.674 kg)  BMI 23.36 kg/m2  SpO2 98% General:   Well developed, well nourished . NAD.  Neck:  Full range of motion. Supple. No  thyromegaly , normal carotid pulse HEENT:  Normocephalic . Face symmetric, atraumatic Lungs:  CTA B Normal respiratory effort, no intercostal retractions, no accessory muscle use. Heart: RRR,  no murmur.  Abdomen:  Not distended, soft, non-tender. No rebound or rigidity. No mass,question od palpable Ao at upper abd, no bruit Muscle skeletal: no pretibial edema bilaterally  Skin: Exposed areas without rash. Not pale. Not jaundice Neurologic:  alert & oriented X3.  Speech normal, gait appropriate for age and unassisted Strength symmetric and appropriate for age.  Psych: Cognition and judgment appear intact.  Cooperative with  normal attention span and concentration.  Behavior appropriate. No anxious or depressed appearing.        Assessment & Plan:    BMP, FLP, CBC, A1c, iron, ferritin

## 2015-03-16 NOTE — Assessment & Plan Note (Addendum)
seems well-controlled, check a BMP,continue losartan, Coreg, lisinopril HCT

## 2015-03-17 LAB — CBC WITH DIFFERENTIAL/PLATELET
BASOS ABS: 0 10*3/uL (ref 0.0–0.1)
Basophils Relative: 0.6 % (ref 0.0–3.0)
EOS ABS: 0.1 10*3/uL (ref 0.0–0.7)
Eosinophils Relative: 3.6 % (ref 0.0–5.0)
HCT: 34.5 % — ABNORMAL LOW (ref 36.0–46.0)
Hemoglobin: 11.7 g/dL — ABNORMAL LOW (ref 12.0–15.0)
LYMPHS PCT: 30.6 % (ref 12.0–46.0)
Lymphs Abs: 1.2 10*3/uL (ref 0.7–4.0)
MCHC: 33.9 g/dL (ref 30.0–36.0)
MCV: 96.3 fl (ref 78.0–100.0)
MONO ABS: 0.4 10*3/uL (ref 0.1–1.0)
Monocytes Relative: 9.2 % (ref 3.0–12.0)
Neutro Abs: 2.3 10*3/uL (ref 1.4–7.7)
Neutrophils Relative %: 56 % (ref 43.0–77.0)
Platelets: 208 10*3/uL (ref 150.0–400.0)
RBC: 3.59 Mil/uL — AB (ref 3.87–5.11)
RDW: 14 % (ref 11.5–15.5)
WBC: 4.1 10*3/uL (ref 4.0–10.5)

## 2015-03-17 LAB — BASIC METABOLIC PANEL
BUN: 32 mg/dL — ABNORMAL HIGH (ref 6–23)
CHLORIDE: 104 meq/L (ref 96–112)
CO2: 26 mEq/L (ref 19–32)
Calcium: 10.1 mg/dL (ref 8.4–10.5)
Creatinine, Ser: 1.34 mg/dL — ABNORMAL HIGH (ref 0.40–1.20)
GFR: 48.26 mL/min — ABNORMAL LOW (ref >60.00)
Glucose, Bld: 73 mg/dL (ref 70–99)
POTASSIUM: 3.8 meq/L (ref 3.5–5.1)
SODIUM: 138 meq/L (ref 135–145)

## 2015-03-17 LAB — LIPID PANEL
CHOLESTEROL: 184 mg/dL (ref 0–200)
HDL: 60.8 mg/dL (ref >39.00)
LDL CALC: 112 mg/dL — AB (ref 0–99)
NonHDL: 123.2
TRIGLYCERIDES: 57 mg/dL (ref 0.0–149.0)
Total CHOL/HDL Ratio: 3
VLDL: 11.4 mg/dL (ref 0.0–40.0)

## 2015-03-17 LAB — IRON: IRON: 100 ug/dL (ref 42–145)

## 2015-03-17 LAB — FERRITIN: Ferritin: 64.4 ng/mL (ref 10.0–291.0)

## 2015-03-17 LAB — HEMOGLOBIN A1C: Hgb A1c MFr Bld: 6 % (ref 4.6–6.5)

## 2015-03-24 ENCOUNTER — Encounter: Payer: Self-pay | Admitting: Internal Medicine

## 2015-03-30 ENCOUNTER — Ambulatory Visit (HOSPITAL_BASED_OUTPATIENT_CLINIC_OR_DEPARTMENT_OTHER)
Admission: RE | Admit: 2015-03-30 | Discharge: 2015-03-30 | Disposition: A | Discharge status: Home / Self Care | Payer: Medicare Other | Source: Ambulatory Visit | Attending: Internal Medicine | Admitting: Internal Medicine

## 2015-03-30 DIAGNOSIS — Z1389 Encounter for screening for other disorder: Secondary | ICD-10-CM | POA: Diagnosis present

## 2015-03-30 DIAGNOSIS — Z136 Encounter for screening for cardiovascular disorders: Secondary | ICD-10-CM

## 2015-07-28 ENCOUNTER — Other Ambulatory Visit: Payer: Self-pay | Admitting: Internal Medicine

## 2015-09-16 ENCOUNTER — Ambulatory Visit: Payer: Medicare Other | Admitting: Internal Medicine

## 2015-09-21 ENCOUNTER — Encounter: Payer: Self-pay | Admitting: Internal Medicine

## 2015-09-21 ENCOUNTER — Ambulatory Visit (INDEPENDENT_AMBULATORY_CARE_PROVIDER_SITE_OTHER): Payer: Medicare Other | Admitting: Internal Medicine

## 2015-09-21 VITALS — BP 122/78 | HR 74 | Temp 97.9°F | Ht 65.0 in | Wt 148.1 lb

## 2015-09-21 DIAGNOSIS — F418 Other specified anxiety disorders: Secondary | ICD-10-CM

## 2015-09-21 DIAGNOSIS — F419 Anxiety disorder, unspecified: Secondary | ICD-10-CM

## 2015-09-21 DIAGNOSIS — F329 Major depressive disorder, single episode, unspecified: Secondary | ICD-10-CM

## 2015-09-21 DIAGNOSIS — I10 Essential (primary) hypertension: Secondary | ICD-10-CM

## 2015-09-21 DIAGNOSIS — M150 Primary generalized (osteo)arthritis: Secondary | ICD-10-CM | POA: Diagnosis not present

## 2015-09-21 DIAGNOSIS — Z09 Encounter for follow-up examination after completed treatment for conditions other than malignant neoplasm: Secondary | ICD-10-CM

## 2015-09-21 DIAGNOSIS — M159 Polyosteoarthritis, unspecified: Secondary | ICD-10-CM

## 2015-09-21 NOTE — Progress Notes (Signed)
Pre visit review using our clinic review tool, if applicable. No additional management support is needed unless otherwise documented below in the visit note. 

## 2015-09-21 NOTE — Patient Instructions (Signed)
Get your blood work before you leave   Take aspirin only 81 mg daily  For pain: Tylenol  500 mg OTC 2 tabs a day every 8 hours as needed for pain  Do not take ibuprofen, naproxen for pain, that may affect your stomach and kidney   Next visit  for a  physical exam, fasting in 6 months  Please schedule an appointment at the front desk

## 2015-09-21 NOTE — Progress Notes (Signed)
Subjective:    Patient ID: [name] [name], female    DOB: 1927-04-15, 81 y.o.   MRN: [id number]  DOS:  09/21/2015 Type of visit - description : routine checkup Interval history: DJD: Continue with knee pain and left knee weakness but she still remains active, they has been slightly worse in the last few weeks, wonders if he can go back on Tylenol Hypertension: good compliance of medication, ambulatory BPs 130, 110/60s Depression anxiety and insomnia: Good compliance of medication, currently doing well emotionally.   Review of Systems In general feeling well, using hearing aids, would like her ears checked for wax. Denies any ear pain or discharge.  Past Medical History  Diagnosis Date  . Cardiomyopathy     had normal cath 2007 w/ EF 52%; normal EF per echo 04/2013  . Hx of colonic polyps   . Hypertension   . Osteopenia   . Anemia   . Osteoarthritis   . Anxiety and depression 07/16/2009  . ASD (atrial septal defect)      per Bubble study 2004    Past Surgical History  Procedure Laterality Date  . Tonsillectomy    . Back surgery  2000    due to fall  . Breast surgery      negative Bx    Social History   Social History  . Marital Status: Widowed    Spouse Name: N/A  . Number of Children: 1  . Years of Education: N/A   Occupational History  . retired--Motivational Speaker    Social History Main Topics  . Smoking status: Never Smoker   . Smokeless tobacco: Never Used  . Alcohol Use: No  . Drug Use: No  . Sexual Activity: Not Currently   Other Topics Concern  . Not on file   Social History Narrative   Widow, moved to [facility] 2007.   Has a daughter Vikki Ports and a G-d in [place]; lost her son-in-law, moved w/ daughter 2014 due to depression            Medication List       This list is accurate as of: 09/21/15 11:59 PM.  Always use your most recent med list.               aspirin 81 MG tablet  Take 81 mg by mouth daily.     buPROPion 150 MG 24 hr  tablet  Commonly known as:  WELLBUTRIN XL  Take 450 mg by mouth every morning.     carvedilol 12.5 MG tablet  Commonly known as:  COREG  Take 1 tablet (12.5 mg total) by mouth 2 (two) times daily with a meal.     clonazePAM 0.5 MG tablet  Commonly known as:  KLONOPIN  Take 1.5 mg by mouth at bedtime.     cycloSPORINE 0.05 % ophthalmic emulsion  Commonly known as:  RESTASIS  Place 1 drop into both eyes 2 (two) times daily.     lisinopril-hydrochlorothiazide 20-12.5 MG tablet  Commonly known as:  PRINZIDE,ZESTORETIC  Take 2 tablets by mouth daily.     losartan 100 MG tablet  Commonly known as:  COZAAR  Take 1 tablet (100 mg total) by mouth daily.     mirtazapine 30 MG tablet  Commonly known as:  REMERON  Take 30 mg by mouth at bedtime.     multivitamin tablet  Take 1 tablet by mouth every morning.     polyethylene glycol packet  Commonly known as:  MIRALAX /  GLYCOLAX  Take 17 g by mouth 3 (three) times a week. Mon, wed, and fri     TYLENOL PO  Take 500 mg by mouth daily as needed.           Objective:   Physical Exam BP 122/78 mmHg  Pulse 74  Temp(Src) 97.9 F (36.6 C) (Oral)  Ht 5\' 5"  (1.651 m)  Wt 148 lb 2 oz (67.189 kg)  BMI 24.65 kg/m2  SpO2 98% General:   Well developed, well nourished . NAD.  HEENT:  Normocephalic . Face symmetric, atraumatic. Ear canals normal, TMs normal Lungs:  CTA B Normal respiratory effort, no intercostal retractions, no accessory muscle use. Heart: RRR,  no murmur.  No pretibial edema bilaterally  MSK: Knees w/ changes consistent with DJD, no effusion, no warm or red Neurologic:  alert & oriented X3.  Speech normal, gait appropriate for age and unassisted Psych--  Cognition and judgment appear intact.  Cooperative with normal attention span and concentration.  Behavior appropriate. No anxious or depressed appearing.      Assessment & Plan:   Assessment > Prediabetes HTN DJD Mild CRI creatinine 1.3, 1.4 Anxiety  depression onset 2010 -- Dr Donell Beers Insomnia -on clonazepam rx by pcp Osteopenia HOH-- aids rx 2016 CV: --ASD per bubble study 2004 --Cardiomyopathy? Normal 2007 EF 52%, normal EF per echo H/o anemia -- normal iron  03-2015  PLAN HTN: Well-controlled, history of a creatinine slightly elevated, recheck a BMP. DJD: She has advanced DJD in the knees but is doing well, she already saw a sports medicine, recommended conservative treatment, encouraged judicious use of Tylenol, avoid NSAIDs, ice. Psych depression and insomnia: Well-controlled. HOH: Using hearing aids, doing well. RTC 6 months for a physical exam

## 2015-09-22 LAB — BASIC METABOLIC PANEL
BUN: 34 mg/dL — ABNORMAL HIGH (ref 6–23)
CALCIUM: 10.4 mg/dL (ref 8.4–10.5)
CO2: 29 mEq/L (ref 19–32)
Chloride: 106 mEq/L (ref 96–112)
Creatinine, Ser: 1.3 mg/dL — ABNORMAL HIGH (ref 0.40–1.20)
GFR: 49.92 mL/min — AB (ref >60.00)
GLUCOSE: 72 mg/dL (ref 70–99)
POTASSIUM: 3.9 meq/L (ref 3.5–5.1)
SODIUM: 143 meq/L (ref 135–145)

## 2015-09-23 ENCOUNTER — Encounter: Payer: Self-pay | Admitting: Internal Medicine

## 2015-09-23 DIAGNOSIS — Z09 Encounter for follow-up examination after completed treatment for conditions other than malignant neoplasm: Secondary | ICD-10-CM | POA: Insufficient documentation

## 2015-09-23 NOTE — Assessment & Plan Note (Signed)
HTN: Well-controlled, history of a creatinine slightly elevated, recheck a BMP. DJD: She has advanced DJD in the knees but is doing well, she already saw a sports medicine, recommended conservative treatment, encouraged judicious use of Tylenol, avoid NSAIDs, ice. Psych depression and insomnia: Well-controlled. HOH: Using hearing aids, doing well. RTC 6 months for a physical exam

## 2015-12-13 LAB — HM MAMMOGRAPHY

## 2015-12-17 ENCOUNTER — Encounter: Payer: Self-pay | Admitting: Internal Medicine

## 2015-12-23 ENCOUNTER — Encounter: Payer: Self-pay | Admitting: Internal Medicine

## 2016-01-24 NOTE — Progress Notes (Addendum)
Patient ID: [name] [name], female   DOB: [birth year], 82 y.o.   MRN: [id number] [name] [name] is seen back today for a follow up visit.  Has not been seen by me  since January OF 2015. She has a history of DCM, presumed to be nonischemic with normal cath back in 2007. Other issues include asymptomatic PVCs, restless legs, HTN, anemia, depression and anxiety. Apparently with a remote positive bubble study for ASD.  Last echo 2014  Study Conclusions  - Left ventricle: Inferior hypokinesis. The cavity size was normal. Wall thickness was normal. Systolic function was normal. The estimated ejection fraction was in the range of 50% to 55%. Doppler parameters are consistent with abnormal left ventricular relaxation (grade 1 diastolic dysfunction). Doppler parameters are consistent with high ventricular filling pressure. - Right ventricle: The cavity size was normal. Systolic function was normal. - Pulmonary arteries: PA peak pressure: 36mm Hg (S). - Inferior vena cava: Poorly visualized. - Pericardium, extracardiac: A trivial pericardial effusion was identified posterior to the heart.  Did not take BP meds this am In general well controlled  No edema, PND, orthopnea.  Lives with daughter Still driving.  No chest pain palpitations or syncope  ROS: Denies fever, malais, weight loss, blurry vision, decreased visual acuity, cough, sputum, SOB, hemoptysis, pleuritic pain, palpitaitons, heartburn, abdominal pain, melena, lower extremity edema, claudication, or rash.  All other systems reviewed and negative  General: Affect appropriate Healthy:  appears stated age HEENT: normal Neck supple with no adenopathy JVP normal no bruits no thyromegaly Lungs clear with no wheezing and good diaphragmatic motion Heart:  S1/S2 no murmur, no rub, gallop or click PMI normal Abdomen: benighn, BS positve, no tenderness, no AAA no bruit.  No HSM or HJR Distal pulses intact with no bruits No edema Neuro  non-focal Skin warm and dry No muscular weakness   Current Outpatient Prescriptions  Medication Sig Dispense Refill  . aspirin 81 MG tablet Take 81 mg by mouth daily.    Marland Kitchen buPROPion (WELLBUTRIN XL) 150 MG 24 hr tablet Take 450 mg by mouth every morning.     . carvedilol (COREG) 12.5 MG tablet Take 1 tablet (12.5 mg total) by mouth 2 (two) times daily with a meal. 180 tablet 3  . clonazePAM (KLONOPIN) 0.5 MG tablet Take 1.5 mg by mouth at bedtime.    . cycloSPORINE (RESTASIS) 0.05 % ophthalmic emulsion Place 1 drop into both eyes 2 (two) times daily.    Marland Kitchen lisinopril-hydrochlorothiazide (PRINZIDE,ZESTORETIC) 20-12.5 MG per tablet Take 2 tablets by mouth daily. 180 tablet 3  . losartan (COZAAR) 100 MG tablet Take 1 tablet (100 mg total) by mouth daily. 90 tablet 3  . mirtazapine (REMERON) 30 MG tablet Take 30 mg by mouth at bedtime.     . Multiple Vitamin (MULTIVITAMIN) tablet Take 1 tablet by mouth every morning.     . polyethylene glycol (MIRALAX / GLYCOLAX) packet Take 17 g by mouth 3 (three) times a week. Mon, wed, and fri     No current facility-administered medications for this visit.    Allergies  Shellfish allergy; Codeine; Hydrocodone; and Adhesive  Electrocardiogram:  2/15  SR rate 73  Nonspecific ST changes  01/26/16  SR rate 72 normal   Assessment and Plan HTN: Well controlled.  Continue current medications and low sodium Dash type diet.   DCM: Last EF low normal will repeat since it has been 3 years  PVC" asymptomatic quiescent continue beta blocker  PFO: not noted on  last TTE  No TIA symptoms no need for f/u at this time  Anxiety/Depression continue welburin    [name] [name]

## 2016-01-25 ENCOUNTER — Telehealth: Payer: Self-pay | Admitting: Cardiovascular Disease

## 2016-01-25 NOTE — Telephone Encounter (Signed)
Called patient back about labs. Dr. Eden Emms will order a CMET and Lipid profile tomorrow. Informed patient that she needs to be fasting for these labs. Patient verbalized understanding.

## 2016-01-25 NOTE — Patient Instructions (Addendum)
Medication Instructions:  Your physician recommends that you continue on your current medications as directed. Please refer to the Current Medication list given to you today.  Labwork: Your physician recommends that you have labs today- CMET and lipid panel.  Testing/Procedures: Your physician has requested that you have an echocardiogram. Echocardiography is a painless test that uses sound waves to create images of your heart. It provides your doctor with information about the size and shape of your heart and how well your heart's chambers and valves are working. This procedure takes approximately one hour. There are no restrictions for this procedure.  Follow-Up: Your physician wants you to follow-up in: 6 months with Dr. Eden Emms. You will receive a reminder letter in the mail two months in advance. If you don't receive a letter, please call our office to schedule the follow-up appointment.   If you need a refill on your cardiac medications before your next appointment, please call your pharmacy.

## 2016-01-25 NOTE — Telephone Encounter (Signed)
New Message:   Pt wants to know if she should fast for her appointment tomorrow?

## 2016-01-26 ENCOUNTER — Encounter: Payer: Self-pay | Admitting: Cardiovascular Disease

## 2016-01-26 ENCOUNTER — Ambulatory Visit (INDEPENDENT_AMBULATORY_CARE_PROVIDER_SITE_OTHER): Payer: Medicare Other | Admitting: Cardiovascular Disease

## 2016-01-26 ENCOUNTER — Ambulatory Visit: Payer: Medicare Other | Admitting: Cardiovascular Disease

## 2016-01-26 VITALS — BP 150/82 | HR 72 | Ht 65.0 in | Wt 147.0 lb

## 2016-01-26 DIAGNOSIS — Z79899 Other long term (current) drug therapy: Secondary | ICD-10-CM

## 2016-01-26 DIAGNOSIS — I429 Cardiomyopathy, unspecified: Secondary | ICD-10-CM

## 2016-01-26 DIAGNOSIS — I517 Cardiomegaly: Secondary | ICD-10-CM

## 2016-01-26 DIAGNOSIS — Z09 Encounter for follow-up examination after completed treatment for conditions other than malignant neoplasm: Secondary | ICD-10-CM | POA: Diagnosis not present

## 2016-01-26 DIAGNOSIS — I10 Essential (primary) hypertension: Secondary | ICD-10-CM | POA: Diagnosis not present

## 2016-01-26 LAB — COMPREHENSIVE METABOLIC PANEL
ALBUMIN: 3.9 g/dL (ref 3.6–5.1)
ALT: 9 U/L (ref 6–29)
AST: 17 U/L (ref 10–35)
Alkaline Phosphatase: 46 U/L (ref 33–130)
BUN: 30 mg/dL — ABNORMAL HIGH (ref 7–25)
CALCIUM: 9.8 mg/dL (ref 8.6–10.4)
CHLORIDE: 106 mmol/L (ref 98–110)
CO2: 22 mmol/L (ref 20–31)
Creat: 1.61 mg/dL — ABNORMAL HIGH (ref 0.60–0.88)
Glucose, Bld: 92 mg/dL (ref 65–99)
Potassium: 4.9 mmol/L (ref 3.5–5.3)
Sodium: 140 mmol/L (ref 135–146)
Total Bilirubin: 0.3 mg/dL (ref 0.2–1.2)
Total Protein: 6.5 g/dL (ref 6.1–8.1)

## 2016-01-26 LAB — LIPID PANEL
CHOL/HDL RATIO: 2.9 ratio (ref <=5.0)
CHOLESTEROL: 190 mg/dL (ref 125–200)
HDL: 66 mg/dL (ref >=46)
LDL Cholesterol: 109 mg/dL (ref <130)
TRIGLYCERIDES: 76 mg/dL (ref <150)
VLDL: 15 mg/dL (ref <30)

## 2016-01-31 ENCOUNTER — Telehealth: Payer: Self-pay

## 2016-01-31 DIAGNOSIS — Z79899 Other long term (current) drug therapy: Secondary | ICD-10-CM

## 2016-01-31 DIAGNOSIS — R799 Abnormal finding of blood chemistry, unspecified: Secondary | ICD-10-CM

## 2016-01-31 NOTE — Telephone Encounter (Signed)
Called patient about lab results. Per Dr. Eden Emms, BUN/Cr up I think Dr Drue Novel has her on both cozaar and zetoretic. Stop zestoretic and repeat labs in 4 weeks If BP goes up would add norvasc 5-10 mg. Chol is fine. Patient verbalized understanding.

## 2016-01-31 NOTE — Telephone Encounter (Signed)
-----   Message from Wendall Stade, MD sent at 01/30/2016  8:56 PM EDT ----- BUN/Cr up I think Dr Drue Novel has her on both cozaar and zetoretic.  Stop zestoretic and repeat labs in 4 weeks If BP goes up would add norvasc 5-10 mg  Chol is fine

## 2016-02-01 ENCOUNTER — Other Ambulatory Visit (HOSPITAL_COMMUNITY): Payer: Self-pay | Admitting: Psychiatry

## 2016-02-10 ENCOUNTER — Ambulatory Visit (HOSPITAL_COMMUNITY): Payer: Medicare Other | Attending: Cardiovascular Disease

## 2016-02-10 ENCOUNTER — Other Ambulatory Visit: Payer: Self-pay

## 2016-02-10 DIAGNOSIS — I517 Cardiomegaly: Secondary | ICD-10-CM | POA: Diagnosis not present

## 2016-02-10 DIAGNOSIS — I119 Hypertensive heart disease without heart failure: Secondary | ICD-10-CM | POA: Insufficient documentation

## 2016-02-10 DIAGNOSIS — I340 Nonrheumatic mitral (valve) insufficiency: Secondary | ICD-10-CM | POA: Insufficient documentation

## 2016-02-10 DIAGNOSIS — I071 Rheumatic tricuspid insufficiency: Secondary | ICD-10-CM | POA: Diagnosis not present

## 2016-02-14 ENCOUNTER — Telehealth: Payer: Self-pay | Admitting: Cardiovascular Disease

## 2016-02-14 DIAGNOSIS — I429 Cardiomyopathy, unspecified: Secondary | ICD-10-CM

## 2016-02-14 NOTE — Telephone Encounter (Signed)
Pt returned call to our office-wants echo results if available

## 2016-02-14 NOTE — Telephone Encounter (Signed)
Called patient with echo results. Per Dr. Eden Emms, EF a bit lower than 3 years ok continue meds f/u cardiac MRI in 6 months. Patient verbalized understanding. Ordered cardiac MRI, sent message to pre-auth and [facility] to schedule.

## 2016-02-15 ENCOUNTER — Encounter: Payer: Self-pay | Admitting: Cardiovascular Disease

## 2016-02-18 ENCOUNTER — Encounter: Payer: Self-pay | Admitting: Internal Medicine

## 2016-02-18 ENCOUNTER — Ambulatory Visit (INDEPENDENT_AMBULATORY_CARE_PROVIDER_SITE_OTHER): Payer: Medicare Other | Admitting: Internal Medicine

## 2016-02-18 VITALS — BP 146/88 | HR 76 | Temp 97.6°F | Ht 65.0 in | Wt 146.1 lb

## 2016-02-18 DIAGNOSIS — I10 Essential (primary) hypertension: Secondary | ICD-10-CM

## 2016-02-18 DIAGNOSIS — Z09 Encounter for follow-up examination after completed treatment for conditions other than malignant neoplasm: Secondary | ICD-10-CM

## 2016-02-18 LAB — BASIC METABOLIC PANEL
BUN: 20 mg/dL (ref 7–25)
CALCIUM: 10.1 mg/dL (ref 8.6–10.4)
CO2: 24 mmol/L (ref 20–31)
Chloride: 104 mmol/L (ref 98–110)
Creat: 1.24 mg/dL — ABNORMAL HIGH (ref 0.60–0.88)
Glucose, Bld: 81 mg/dL (ref 65–99)
POTASSIUM: 4.3 mmol/L (ref 3.5–5.3)
SODIUM: 141 mmol/L (ref 135–146)

## 2016-02-18 MED ORDER — AMLODIPINE BESYLATE 10 MG PO TABS: 10.0000 mg | ORAL_TABLET | Freq: Every day | ORAL | Status: DC

## 2016-02-18 NOTE — Progress Notes (Signed)
Pre visit review using our clinic review tool, if applicable. No additional management support is needed unless otherwise documented below in the visit note. 

## 2016-02-18 NOTE — Progress Notes (Signed)
Subjective:    Patient ID: [name] [name], female    DOB: December 30, 1926, 82 y.o.   MRN: [id number]  DOS:  02/18/2016 Type of visit - description : Follow-up Interval history: Saw cardiology 01/24/2016, BMP was checked, creatinine above than baseline, she was rx to dc lisinopril, HCTZ and continue losartan. Since then, she is checking her BP has been consistently in the 150s/90s. Otherwise feels well. He is not taking Motrin or similar medications   Review of Systems Denies chest pain, difficulty breathing or lower extremity edema No headache or dizziness  Past Medical History  Diagnosis Date  . Cardiomyopathy     had normal cath 2007 w/ EF 52%; normal EF per echo 04/2013  . Hx of colonic polyps   . Hypertension   . Osteopenia   . Anemia   . Osteoarthritis   . Anxiety and depression 07/16/2009  . ASD (atrial septal defect)      per Bubble study 2004    Past Surgical History  Procedure Laterality Date  . Tonsillectomy    . Back surgery  2000    due to fall  . Breast surgery      negative Bx    Social History   Social History  . Marital Status: Widowed    Spouse Name: N/A  . Number of Children: 1  . Years of Education: N/A   Occupational History  . retired--Motivational Speaker    Social History Main Topics  . Smoking status: Never Smoker   . Smokeless tobacco: Never Used  . Alcohol Use: No  . Drug Use: No  . Sexual Activity: Not Currently   Other Topics Concern  . Not on file   Social History Narrative   Widow, moved to [facility] [place].   Has a daughter Vikki Ports and a G-d in [place]; lost her son-in-law, moved w/ daughter 2014 due to depression            Medication List       This list is accurate as of: 02/18/16 11:59 PM.  Always use your most recent med list.               amLODipine 10 MG tablet  Commonly known as:  NORVASC  Take 1 tablet (10 mg total) by mouth daily.     aspirin 81 MG tablet  Take 81 mg by mouth daily.     buPROPion 150 MG 24  hr tablet  Commonly known as:  WELLBUTRIN XL  Take 450 mg by mouth every morning.     carvedilol 12.5 MG tablet  Commonly known as:  COREG  Take 1 tablet (12.5 mg total) by mouth 2 (two) times daily with a meal.     clonazePAM 0.5 MG tablet  Commonly known as:  KLONOPIN  Take 1.5 mg by mouth at bedtime.     cycloSPORINE 0.05 % ophthalmic emulsion  Commonly known as:  RESTASIS  Place 1 drop into both eyes 2 (two) times daily.     losartan 100 MG tablet  Commonly known as:  COZAAR  Take 1 tablet (100 mg total) by mouth daily.     mirtazapine 30 MG tablet  Commonly known as:  REMERON  Take 30 mg by mouth at bedtime.     multivitamin tablet  Take 1 tablet by mouth every morning.     polyethylene glycol packet  Commonly known as:  MIRALAX / GLYCOLAX  Take 17 g by mouth 3 (three) times a week. [place],  wed, and fri           Objective:   Physical Exam BP 146/88 mmHg  Pulse 76  Temp(Src) 97.6 F (36.4 C) (Oral)  Ht  (1.651 m)  Wt 146 lb 2 oz (66.282 kg)  BMI 24.32 kg/m2  SpO2 97% General:   Well developed, well nourished . NAD.  HEENT:  Normocephalic . Face symmetric, atraumatic Lungs:  CTA B Normal respiratory effort, no intercostal retractions, no accessory muscle use. Heart: RRR,  no murmur.  No pretibial edema bilaterally  Skin: Not pale. Not jaundice Neurologic:  alert & oriented X3.  Speech normal, gait appropriate for age and unassisted Psych--  Cognition and judgment appear intact.  Cooperative with normal attention span and concentration.  Behavior appropriate. No anxious or depressed appearing.      Assessment & Plan:   Assessment > Prediabetes HTN DJD Mild CRI creatinine 1.3, 1.4 Anxiety depression onset 2010 -- Dr Donell Beers Insomnia -on clonazepam rx by pcp Osteopenia HOH-- aids rx 2016 CV: --ASD per bubble study 2004 --Cardiomyopathy? Normal 2007 EF 52%, normal EF per echo H/o anemia -- normal iron  03-2015  PLAN HTN: Recently  lisinopril HCT was dc d/t elevated creat, she is on losartan, BP is elevated. Plan: Continue losartan, amlodipine 10 mg, watch for edema. Check a BMP. Monitor BPs. RTC 03-2016 for a CPX.

## 2016-02-18 NOTE — Patient Instructions (Addendum)
GO TO THE LAB :      Get the blood work    Start amlodipine 10 mg 1 a day  Watch for swelling  Your BP should gradually improve     Check the  blood pressure   daily Be sure your blood pressure is between 110/65 and  145/85. If it is consistently higher or lower, let me know

## 2016-02-19 NOTE — Assessment & Plan Note (Signed)
HTN: Recently lisinopril HCT was dc d/t elevated creat, she is on losartan, BP is elevated. Plan: Continue losartan, amlodipine 10 mg, watch for edema. Check a BMP. Monitor BPs. RTC 03-2016 for a CPX.

## 2016-02-23 ENCOUNTER — Telehealth: Payer: Self-pay | Admitting: Cardiovascular Disease

## 2016-02-23 NOTE — Telephone Encounter (Signed)
Pt is returning your call

## 2016-02-23 NOTE — Telephone Encounter (Signed)
Left message for patient to call back. Patient had recent BMET with PCP. Patient was suppose to have repeat BMET to check kidney function since creatinine and BUN were elevated on 01/26/2016. Per Dr. Drue Novel, kidney function has improved.

## 2016-02-23 NOTE — Telephone Encounter (Signed)
New message  Pt called req a call back to discuss if the lab appt is needed/ Pt states that she has had labs with PCP. She asks if Dr. Eden Emms and her PCP are colaberating together about her care.

## 2016-02-23 NOTE — Telephone Encounter (Signed)
Informed patient that her recent labs with her PCP will be routed to Dr. Eden Emms to reviewed. Will call her back if lab work needs to be repeated, but at this time will cancel her lab appointment with her kidney function improving.

## 2016-02-23 NOTE — Telephone Encounter (Signed)
Follow Up:; ° ° °Returning your call. °

## 2016-02-23 NOTE — Telephone Encounter (Signed)
Left message to call back  

## 2016-02-28 ENCOUNTER — Other Ambulatory Visit: Payer: Medicare Other

## 2016-02-29 ENCOUNTER — Other Ambulatory Visit (HOSPITAL_COMMUNITY): Payer: Medicare Other

## 2016-03-20 ENCOUNTER — Ambulatory Visit (INDEPENDENT_AMBULATORY_CARE_PROVIDER_SITE_OTHER): Payer: Medicare Other | Admitting: Internal Medicine

## 2016-03-20 ENCOUNTER — Encounter: Payer: Self-pay | Admitting: Internal Medicine

## 2016-03-20 DIAGNOSIS — I10 Essential (primary) hypertension: Secondary | ICD-10-CM | POA: Diagnosis not present

## 2016-03-20 DIAGNOSIS — M150 Primary generalized (osteo)arthritis: Secondary | ICD-10-CM | POA: Diagnosis not present

## 2016-03-20 DIAGNOSIS — F419 Anxiety disorder, unspecified: Secondary | ICD-10-CM

## 2016-03-20 DIAGNOSIS — Z0000 Encounter for general adult medical examination without abnormal findings: Secondary | ICD-10-CM | POA: Diagnosis not present

## 2016-03-20 DIAGNOSIS — Z09 Encounter for follow-up examination after completed treatment for conditions other than malignant neoplasm: Secondary | ICD-10-CM | POA: Diagnosis not present

## 2016-03-20 DIAGNOSIS — F418 Other specified anxiety disorders: Secondary | ICD-10-CM

## 2016-03-20 DIAGNOSIS — F329 Major depressive disorder, single episode, unspecified: Secondary | ICD-10-CM

## 2016-03-20 DIAGNOSIS — F32A Depression, unspecified: Secondary | ICD-10-CM

## 2016-03-20 DIAGNOSIS — R739 Hyperglycemia, unspecified: Secondary | ICD-10-CM

## 2016-03-20 DIAGNOSIS — M159 Polyosteoarthritis, unspecified: Secondary | ICD-10-CM

## 2016-03-20 LAB — CBC WITH DIFFERENTIAL/PLATELET
BASOS ABS: 0 10*3/uL (ref 0.0–0.1)
Basophils Relative: 0.7 % (ref 0.0–3.0)
EOS PCT: 2.3 % (ref 0.0–5.0)
Eosinophils Absolute: 0.1 10*3/uL (ref 0.0–0.7)
HCT: 40.8 % (ref 36.0–46.0)
HEMOGLOBIN: 13.8 g/dL (ref 12.0–15.0)
LYMPHS ABS: 1.4 10*3/uL (ref 0.7–4.0)
Lymphocytes Relative: 22.8 % (ref 12.0–46.0)
MCHC: 33.8 g/dL (ref 30.0–36.0)
MCV: 96.5 fl (ref 78.0–100.0)
MONO ABS: 0.4 10*3/uL (ref 0.1–1.0)
MONOS PCT: 6 % (ref 3.0–12.0)
NEUTROS PCT: 68.2 % (ref 43.0–77.0)
Neutro Abs: 4.1 10*3/uL (ref 1.4–7.7)
Platelets: 229 10*3/uL (ref 150.0–400.0)
RBC: 4.23 Mil/uL (ref 3.87–5.11)
RDW: 14.4 % (ref 11.5–15.5)
WBC: 5.9 10*3/uL (ref 4.0–10.5)

## 2016-03-20 LAB — HEMOGLOBIN A1C: HEMOGLOBIN A1C: 6 % (ref 4.6–6.5)

## 2016-03-20 MED ORDER — AMLODIPINE BESYLATE 10 MG PO TABS: 10.0000 mg | ORAL_TABLET | Freq: Every day | ORAL | Status: DC

## 2016-03-20 NOTE — Assessment & Plan Note (Addendum)
Td 2-08, shingles shot 2009, pneumonia shot 1-08 ;prevnar 2015  No further breast cancer screening , see previous entries No  further Paps, see previous entries   s/p Cscope Dr Randa Evens 2006, and 10- 2011, no further scopes   Diet and exercise discussed  Ao [place] 2016 neg for AAA

## 2016-03-20 NOTE — Assessment & Plan Note (Signed)
Prediabetes: Has a very healthy lifestyle, check A1c HTN: Currently well controlled, continue amlodipine 10 mg, losartan, carvedilol. Recent BMP satisfactory. Check a CBC DJD: Elbow pain actually consistent with tennis elbow. Recommend ice/ brace. Anxiety depression insomnia: Well-controlled Osteopenia: Continue calcium and vitamin D Cardiovascular: Seems stable, cardiology recommended a heart MRI RTC 4 months

## 2016-03-20 NOTE — Progress Notes (Signed)
Pre visit review using our clinic review tool, if applicable. No additional management support is needed unless otherwise documented below in the visit note. 

## 2016-03-20 NOTE — Progress Notes (Signed)
Subjective:    Patient ID: [name] [name], female    DOB: January 05, 1927, 82 y.o.   MRN: [id number]  DOS:  03/20/2016 Type of visit - description :  Here for Medicare AWV: 1. Risk factors based on Past M, S, F history: reviewed   2. Physical Activities: [facility]   3. Depression/mood: (-) screening , well-controlled on medications 4. Hearing: has hearing aids, work well for her   5. ADL's: totally independent, still drives   6. Fall Risk: no falls this year  , prevention discusses 7.  Home Safety: does feel safe ar home   8. Height, weight, &visual acuity: see VS,  vision well corrected  w/ glasses , has pre-glaucoma, sees eye MD q 4-6  months 9. Counseling: yes, see a/p   10. Labs ordered based on risk factors: yes   11.  Referral Coordination: yes , see below   12. Care Plan:see a/p  , written plan provided , see AVS 13. Cognitive Assessment: motor skills appropriate for age, memory and cognition seems > average for age. 14. Care team updated t   15. End-of-life care discussed, has a HC-POA  in addition we discussed the following issues Prediabetes: Has a excellent lifestyle, exercise regularly. HTN: Ambulatory BPs all within normals DJD: Complained of right elbow pain, mostly [facility] and does- some lifting, no redness, swelling or actual injury Anxiety, depression and insomnia: Well-controlled. Cardiovascular: Saw cardiology 01-2016, stable    Review of Systems Constitutional: No fever. No chills. No unexplained wt changes. No unusual sweats  HEENT: No dental problems, no ear discharge, no facial swelling, no voice changes. No eye discharge, no eye  redness , no  intolerance to light   Respiratory: No wheezing , no  difficulty breathing. No cough , no mucus production  Cardiovascular: No CP, no leg swelling , no  Palpitations  GI: no nausea, no vomiting, no diarrhea , no  abdominal pain.  No blood in the stools. No dysphagia, no  odynophagia    Endocrine: No polyphagia, no polyuria , no polydipsia  GU: No dysuria, gross hematuria, difficulty urinating. Chronic urinary frequency, no better worse, not an issue at this point  Musculoskeletal: No joint swellings or unusual aches or pains  Skin: No change in the color of the skin, palor , no  Rash  Allergic, immunologic: No environmental allergies , no  food allergies  Neurological: No dizziness no  syncope. No headaches. No diplopia, no slurred, no slurred speech, no motor deficits, no facial  Numbness  Hematological: No enlarged lymph nodes, no easy bruising , no unusual bleedings  Psychiatry: No suicidal ideas, no hallucinations, no beavior problems, no confusion.  No unusual/severe anxiety, no depression   Past Medical History  Diagnosis Date  . Cardiomyopathy     had normal cath 2007 w/ EF 52%; normal EF per echo 04/2013  . Hx of colonic polyps   . Hypertension   . Osteopenia   . Anemia   . Osteoarthritis   . Anxiety and depression 07/16/2009  . ASD (atrial septal defect)      per Bubble study 2004    Past Surgical History  Procedure Laterality Date  . Tonsillectomy    . Back surgery  2000    due to fall  . Breast surgery      negative Bx    Social History   Social History  . Marital Status: Widowed  Spouse Name: N/A  . Number of Children: 1  . Years of Education: N/A   Occupational History  . retired--Motivational Speaker    Social History Main Topics  . Smoking status: Never Smoker   . Smokeless tobacco: Never Used  . Alcohol Use: No  . Drug Use: No  . Sexual Activity: Not Currently   Other Topics Concern  . Not on file   Social History Narrative   Widow, moved to [facility] 2007.   Has a daughter Vikki Ports and a G-d in [place]; lost her son-in-law, moved w/ daughter 2014 due to depression         Family History  Problem Relation Age of Onset  . Diabetes Neg Hx   . Cancer Neg Hx     colon or breast  . Heart attack Father     F and  2 brothers (no early onset)  . Hypertension Other   . Heart disease Mother        Medication List       This list is accurate as of: 03/20/16  4:33 PM.  Always use your most recent med list.               amLODipine 10 MG tablet  Commonly known as:  NORVASC  Take 1 tablet (10 mg total) by mouth daily.     aspirin 81 MG tablet  Take 81 mg by mouth daily.     buPROPion 150 MG 24 hr tablet  Commonly known as:  WELLBUTRIN XL  Take 450 mg by mouth every morning.     carvedilol 12.5 MG tablet  Commonly known as:  COREG  Take 1 tablet (12.5 mg total) by mouth 2 (two) times daily with a meal.     clonazePAM 0.5 MG tablet  Commonly known as:  KLONOPIN  Take 1.5 mg by mouth at bedtime.     cycloSPORINE 0.05 % ophthalmic emulsion  Commonly known as:  RESTASIS  Place 1 drop into both eyes 2 (two) times daily. Reported on 03/20/2016     latanoprost 0.005 % ophthalmic solution  Commonly known as:  XALATAN  Place 1 drop into both eyes every evening.     losartan 100 MG tablet  Commonly known as:  COZAAR  Take 1 tablet (100 mg total) by mouth daily.     mirtazapine 30 MG tablet  Commonly known as:  REMERON  Take 30 mg by mouth at bedtime.     multivitamin tablet  Take 1 tablet by mouth every morning.     polyethylene glycol packet  Commonly known as:  MIRALAX / GLYCOLAX  Take 17 g by mouth 3 (three) times a week. Mon, wed, and fri           Objective:   Physical Exam BP 128/76 mmHg  Pulse 74  Temp(Src) 97.8 F (36.6 C) (Oral)  Ht 5\' 5"  (1.651 m)  Wt 143 lb 4 oz (64.978 kg)  BMI 23.84 kg/m2  SpO2 97%  General:   Well developed, well nourished . NAD.  Neck: No  thyromegaly   HEENT:  Normocephalic . Face symmetric, atraumatic Lungs:  CTA B Normal respiratory effort, no intercostal retractions, no accessory muscle use. Heart: RRR,  no murmur.  No pretibial edema bilaterally  Abdomen:  Not distended, soft, non-tender. No rebound or rigidity.   MSK: Right  elbow without obvious deformity, redness or swelling, slightly TTP at the epicondilar area Skin: Exposed areas without rash. Not pale. Not jaundice  Neurologic:  alert & oriented X3.  Speech normal, gait appropriate for age and unassisted Strength symmetric and appropriate for age.  Psych: Cognition and judgment appear intact.  Cooperative with normal attention span and concentration.  Behavior appropriate. No anxious or depressed appearing.    Assessment & Plan:   Assessment > Prediabetes HTN DJD Mild CRI creatinine 1.3, 1.4 Anxiety depression onset 2010 -- Dr Donell Beers Insomnia -on clonazepam rx by pcp Osteopenia --T score 2011 normal, T score 02-2013 -1.1 HOH-- aids rx 2016 CV: --ASD per bubble study 2004 --Cardiomyopathy? Normal 2007 EF 52%, normal EF per echo H/o anemia -- normal iron  03-2015  PLAN Prediabetes: Has a very healthy lifestyle, check A1c HTN: Currently well controlled, continue amlodipine 10 mg, losartan, carvedilol. Recent BMP satisfactory. Check a CBC DJD: Elbow pain actually consistent with tennis elbow. Recommend ice/ brace. Anxiety depression insomnia: Well-controlled Osteopenia: Continue calcium and vitamin D Cardiovascular: Seems stable, cardiology recommended a heart MRI RTC 4 months

## 2016-03-20 NOTE — Patient Instructions (Addendum)
GO TO THE LAB : Get the blood work     GO TO THE FRONT DESK Schedule your next appointment for a  routine checkup in 4 months, no fasting   For elbow pain: Use a tennis elbow brace when exercising ICE after exercise      Fall Prevention and Home Safety Falls cause injuries and can affect all age groups. It is possible to use preventive measures to significantly decrease the likelihood of falls. There are many simple measures which can make your home safer and prevent falls. OUTDOORS  Repair cracks and edges of walkways and driveways.  Remove high doorway thresholds.  Trim shrubbery on the main path into your home.  Have good outside lighting.  Clear walkways of tools, rocks, debris, and clutter.  Check that handrails are not broken and are securely fastened. Both sides of steps should have handrails.  Have leaves, snow, and ice cleared regularly.  Use sand or salt on walkways during winter months.  In the garage, clean up grease or oil spills. BATHROOM  Install night lights.  Install grab bars by the toilet and in the tub and shower.  Use non-skid mats or decals in the tub or shower.  Place a plastic non-slip stool in the shower to sit on, if needed.  Keep floors dry and clean up all water on the floor immediately.  Remove soap buildup in the tub or shower on a regular basis.  Secure bath mats with non-slip, double-sided rug tape.  Remove throw rugs and tripping hazards from the floors. BEDROOMS  Install night lights.  Make sure a bedside light is easy to reach.  Do not use oversized bedding.  Keep a telephone by your bedside.  Have a firm chair with side arms to use for getting dressed.  Remove throw rugs and tripping hazards from the floor. KITCHEN  Keep handles on pots and pans turned toward the center of the stove. Use back burners when possible.  Clean up spills quickly and allow time for drying.  Avoid walking on wet floors.  Avoid hot  utensils and knives.  Position shelves so they are not too high or low.  Place commonly used objects within easy reach.  If necessary, use a sturdy step stool with a grab bar when reaching.  Keep electrical cables out of the way.  Do not use floor polish or wax that makes floors slippery. If you must use wax, use non-skid floor wax.  Remove throw rugs and tripping hazards from the floor. STAIRWAYS  Never leave objects on stairs.  Place handrails on both sides of stairways and use them. Fix any loose handrails. Make sure handrails on both sides of the stairways are as long as the stairs.  Check carpeting to make sure it is firmly attached along stairs. Make repairs to worn or loose carpet promptly.  Avoid placing throw rugs at the top or bottom of stairways, or properly secure the rug with carpet tape to prevent slippage. Get rid of throw rugs, if possible.  Have an electrician put in a light switch at the top and bottom of the stairs. OTHER FALL PREVENTION TIPS  Wear low-heel or rubber-soled shoes that are supportive and fit well. Wear closed toe shoes.  When using a stepladder, make sure it is fully opened and both spreaders are firmly locked. Do not climb a closed stepladder.  Add color or contrast paint or tape to grab bars and handrails in your home. Place contrasting color strips  on first and last steps.  Learn and use mobility aids as needed. Install an electrical emergency response system.  Turn on lights to avoid dark areas. Replace light bulbs that burn out immediately. Get light switches that glow.  Arrange furniture to create clear pathways. Keep furniture in the same place.  Firmly attach carpet with non-skid or double-sided tape.  Eliminate uneven floor surfaces.  Select a carpet pattern that does not visually hide the edge of steps.  Be aware of all pets. OTHER HOME SAFETY TIPS  Set the water temperature for 120 F (48.8 C).  Keep emergency numbers on  or near the telephone.  Keep smoke detectors on every level of the home and near sleeping areas. Document Released: 10/20/2002 Document Revised: 04/30/2012 Document Reviewed: 01/19/2012 [facility] Patient Information 2015 [place], [place]. This information is not intended to replace advice given to you by your health care provider. Make sure you discuss any questions you have with your health care provider.   Preventive Care for Adults Ages 75 and over  Blood pressure check.** / Every 1 to 2 years.  Lipid and cholesterol check.**/ Every 5 years beginning at age 49.  Lung cancer screening. / Every year if you are aged 55-80 years and have a 30-pack-year history of smoking and currently smoke or have quit within the past 15 years. Yearly screening is stopped once you have quit smoking for at least 15 years or develop a health problem that would prevent you from having lung cancer treatment.  Fecal occult blood test (FOBT) of stool. / Every year beginning at age 52 and continuing until age 76. You may not have to do this test if you get a colonoscopy every 10 years.  Flexible sigmoidoscopy** or colonoscopy.** / Every 5 years for a flexible sigmoidoscopy or every 10 years for a colonoscopy beginning at age 48 and continuing until age 74.  Hepatitis C blood test.** / For all people born from [birth year] through 1963 and any individual with known risks for hepatitis C.  Abdominal aortic aneurysm (AAA) screening.** / A one-time screening for ages 68 to 81 years who are current or former smokers.  Skin self-exam. / Monthly.  Influenza vaccine. / Every year.  Tetanus, diphtheria, and acellular pertussis (Tdap/Td) vaccine.** / 1 dose of Td every 10 years.  Varicella vaccine.** / Consult your health care provider.  Zoster vaccine.** / 1 dose for adults aged 104 years or older.  Pneumococcal 13-valent conjugate (PCV13) vaccine.** / Consult your health care provider.  Pneumococcal polysaccharide (PPSV23)  vaccine.** / 1 dose for all adults aged 27 years and older.  Meningococcal vaccine.** / Consult your health care provider.  Hepatitis A vaccine.** / Consult your health care provider.  Hepatitis B vaccine.** / Consult your health care provider.  Haemophilus influenzae type b (Hib) vaccine.** / Consult your health care provider. **Family history and personal history of risk and conditions may change your health care provider's recommendations. Document Released: 12/26/2001 Document Revised: 11/04/2013 Document Reviewed: 03/27/2011 [facility] Patient Information 2015 [place], [place]. This information is not intended to replace advice given to you by your health care provider. Make sure you discuss any questions you have with your health care provider.

## 2016-03-21 ENCOUNTER — Telehealth: Payer: Self-pay | Admitting: Cardiovascular Disease

## 2016-03-21 NOTE — Telephone Encounter (Signed)
New Message  Pt request a call back to discuss if it is possible to retrieve an order to sch an MRI.

## 2016-03-21 NOTE — Telephone Encounter (Signed)
Patient had questions about her MRI that is scheduled in October. Explained to the patient that the MRI is to get a clearer and more precise picture of patient's heart, pumping function, and valves. Patient verbalized understanding.

## 2016-04-12 ENCOUNTER — Telehealth: Payer: Self-pay | Admitting: Internal Medicine

## 2016-04-12 NOTE — Telephone Encounter (Signed)
New form completed and mailed to Pt. Copy sent for scanning.

## 2016-04-12 NOTE — Telephone Encounter (Signed)
Reason for call: Pt states Dr. Drue Novel gave her signed form for handicap placard and she lost it. Can he sign and mail her another copy?

## 2016-04-18 ENCOUNTER — Encounter: Payer: Self-pay | Admitting: Internal Medicine

## 2016-05-02 ENCOUNTER — Ambulatory Visit (INDEPENDENT_AMBULATORY_CARE_PROVIDER_SITE_OTHER): Payer: Medicare Other | Admitting: Internal Medicine

## 2016-05-02 ENCOUNTER — Encounter: Payer: Self-pay | Admitting: Internal Medicine

## 2016-05-02 VITALS — BP 126/78 | HR 87 | Temp 97.6°F | Ht 65.0 in | Wt 142.5 lb

## 2016-05-02 DIAGNOSIS — R21 Rash and other nonspecific skin eruption: Secondary | ICD-10-CM

## 2016-05-02 MED ORDER — CEPHALEXIN 500 MG PO CAPS: 500.0000 mg | ORAL_CAPSULE | Freq: Four times a day (QID) | ORAL | Status: DC

## 2016-05-02 MED ORDER — BETAMETHASONE DIPROPIONATE AUG 0.05 % EX CREA: TOPICAL_CREAM | Freq: Two times a day (BID) | CUTANEOUS | Status: DC

## 2016-05-02 NOTE — Progress Notes (Signed)
Subjective:    Patient ID: [name] [name], female    DOB: 17-Jul-1927, 82 y.o.   MRN: [id number]  DOS:  05/02/2016 Type of visit - description : acute Interval history: Symptoms started 2 days ago with a very itchy rash, from the waist down. No F/C No joint aches No recent trips or sleep in a hotel    Review of Systems   Past Medical History  Diagnosis Date  . Cardiomyopathy     had normal cath 2007 w/ EF 52%; normal EF per echo 04/2013  . Hx of colonic polyps   . Hypertension   . Osteopenia   . Anemia   . Osteoarthritis   . Anxiety and depression 07/16/2009  . ASD (atrial septal defect)      per Bubble study 2004    Past Surgical History  Procedure Laterality Date  . Tonsillectomy    . Back surgery  2000    due to fall  . Breast surgery      negative Bx    Social History   Social History  . Marital Status: Widowed    Spouse Name: N/A  . Number of Children: 1  . Years of Education: N/A   Occupational History  . retired--Motivational Speaker    Social History Main Topics  . Smoking status: Never Smoker   . Smokeless tobacco: Never Used  . Alcohol Use: No  . Drug Use: No  . Sexual Activity: Not Currently   Other Topics Concern  . Not on file   Social History Narrative   Widow, moved to [facility] [place].   Has a daughter Vikki Ports and a G-d in [place]; lost her son-in-law, moved w/ daughter 2014 due to depression            Medication List       This list is accurate as of: 05/02/16 11:59 PM.  Always use your most recent med list.               amLODipine 10 MG tablet  Commonly known as:  NORVASC  Take 1 tablet (10 mg total) by mouth daily.     aspirin 81 MG tablet  Take 81 mg by mouth daily.     augmented betamethasone dipropionate 0.05 % cream  Commonly known as:  DIPROLENE-AF  Apply topically 2 (two) times daily.     buPROPion 150 MG 24 hr tablet  Commonly known as:  WELLBUTRIN XL  Take 450 mg by mouth every morning.     carvedilol 12.5  MG tablet  Commonly known as:  COREG  Take 1 tablet (12.5 mg total) by mouth 2 (two) times daily with a meal.     cephALEXin 500 MG capsule  Commonly known as:  KEFLEX  Take 1 capsule (500 mg total) by mouth 4 (four) times daily.     clonazePAM 0.5 MG tablet  Commonly known as:  KLONOPIN  Take 1.5 mg by mouth at bedtime.     cycloSPORINE 0.05 % ophthalmic emulsion  Commonly known as:  RESTASIS  Place 1 drop into both eyes 2 (two) times daily. Reported on 03/20/2016     latanoprost 0.005 % ophthalmic solution  Commonly known as:  XALATAN  Place 1 drop into both eyes every evening.     losartan 100 MG tablet  Commonly known as:  COZAAR  Take 1 tablet (100 mg total) by mouth daily.     mirtazapine 30 MG tablet  Commonly known as:  REMERON  Take 30 mg by mouth at bedtime.     multivitamin tablet  Take 1 tablet by mouth every morning.     polyethylene glycol packet  Commonly known as:  MIRALAX / GLYCOLAX  Take 17 g by mouth 3 (three) times a week. Mon, wed, and fri           Objective:   Physical Exam  Skin:      BP 126/78 mmHg  Pulse 87  Temp(Src) 97.6 F (36.4 C) (Oral)  Ht  (1.651 m)  Wt 142 lb 8 oz (64.638 kg)  BMI 23.71 kg/m2  SpO2 99% General:   Well developed, well nourished . NAD.  HEENT:  Normocephalic . Face symmetric, atraumatic. Conjunctiva without lesions Lungs:  CTA B Normal respiratory effort, no intercostal retractions, no accessory muscle use. Heart: RRR,  no murmur.  No pretibial edema bilaterally  Skin: Multiple skin lesions, they are slightly red,~  3/4 cm, have a minute blister or pustule in the center. Only from the waist down, has several at the suprapubic area B and groin areas. Hands, interdigital skin ---normal. Palms-plants-feet-hand--- free of abnormalities Neurologic:  alert & oriented X3.  Speech normal, gait appropriate for age and unassisted Psych--  Cognition and judgment appear intact.  Cooperative with normal  attention span and concentration.  Behavior appropriate. No anxious or depressed appearing.      Assessment & Plan:   Assessment > Prediabetes HTN DJD Mild CRI creatinine 1.3, 1.4 Anxiety depression onset 2010 -- Dr Donell Beers Insomnia -on clonazepam rx by pcp Osteopenia --T score 2011 normal, T score 02-2013 -1.1 HOH-- aids rx 2016 CV: --ASD per bubble study 2004 --Cardiomyopathy? Normal 2007 EF 52%, normal EF per echo H/o anemia -- normal iron  03-2015  PLAN Pruritic rash: No systemic sx, no new medications. Bed bugs? Allergic reaction? Folliculitis? Plan: Keflex, topical steroids, call if no better or systemic symptoms. RTC 07-2016 as schedule

## 2016-05-02 NOTE — Patient Instructions (Signed)
Take the medications as prescribed  Call if not improving in the next few days,   fever chills, rash is spreading

## 2016-05-02 NOTE — Progress Notes (Signed)
Pre visit review using our clinic review tool, if applicable. No additional management support is needed unless otherwise documented below in the visit note. 

## 2016-05-03 NOTE — Assessment & Plan Note (Signed)
Pruritic rash: No systemic sx, no new medications. Bed bugs? Allergic reaction? Folliculitis? Plan: Keflex, topical steroids, call if no better or systemic symptoms. RTC 07-2016 as schedule

## 2016-05-25 ENCOUNTER — Encounter: Payer: Self-pay | Admitting: Internal Medicine

## 2016-05-26 ENCOUNTER — Ambulatory Visit (INDEPENDENT_AMBULATORY_CARE_PROVIDER_SITE_OTHER): Payer: Medicare Other | Admitting: Medical

## 2016-05-26 ENCOUNTER — Ambulatory Visit (HOSPITAL_BASED_OUTPATIENT_CLINIC_OR_DEPARTMENT_OTHER)
Admission: RE | Admit: 2016-05-26 | Discharge: 2016-05-26 | Disposition: A | Discharge status: Home / Self Care | Payer: Medicare Other | Source: Ambulatory Visit | Attending: Medical | Admitting: Medical

## 2016-05-26 ENCOUNTER — Encounter: Payer: Self-pay | Admitting: Medical

## 2016-05-26 ENCOUNTER — Other Ambulatory Visit: Payer: Self-pay | Admitting: Internal Medicine

## 2016-05-26 VITALS — BP 120/80 | HR 77 | Temp 97.8°F | Ht 65.0 in | Wt 144.2 lb

## 2016-05-26 DIAGNOSIS — M79662 Pain in left lower leg: Secondary | ICD-10-CM | POA: Diagnosis not present

## 2016-05-26 DIAGNOSIS — R600 Localized edema: Secondary | ICD-10-CM

## 2016-05-26 DIAGNOSIS — M25572 Pain in left ankle and joints of left foot: Secondary | ICD-10-CM | POA: Insufficient documentation

## 2016-05-26 DIAGNOSIS — I517 Cardiomegaly: Secondary | ICD-10-CM

## 2016-05-26 LAB — COMPREHENSIVE METABOLIC PANEL
ALT: 11 U/L (ref 0–35)
AST: 17 U/L (ref 0–37)
Albumin: 4.5 g/dL (ref 3.5–5.2)
Alkaline Phosphatase: 46 U/L (ref 39–117)
BUN: 19 mg/dL (ref 6–23)
CALCIUM: 10.1 mg/dL (ref 8.4–10.5)
CHLORIDE: 105 meq/L (ref 96–112)
CO2: 27 meq/L (ref 19–32)
Creatinine, Ser: 1.17 mg/dL (ref 0.40–1.20)
GFR: 56.28 mL/min — AB (ref >60.00)
GLUCOSE: 81 mg/dL (ref 70–99)
POTASSIUM: 3.9 meq/L (ref 3.5–5.1)
Sodium: 139 mEq/L (ref 135–145)
Total Bilirubin: 0.5 mg/dL (ref 0.2–1.2)
Total Protein: 7.6 g/dL (ref 6.0–8.3)

## 2016-05-26 LAB — BRAIN NATRIURETIC PEPTIDE: PRO B NATRI PEPTIDE: 159 pg/mL — AB (ref 0.0–100.0)

## 2016-05-26 NOTE — Progress Notes (Signed)
Pre visit review using our clinic review tool, if applicable. No additional management support is needed unless otherwise documented below in the visit note. 

## 2016-05-26 NOTE — Progress Notes (Signed)
Subjective:    Patient ID: [name] [name], female    DOB: 25-Mar-1927, 82 y.o.   MRN: [id number]  HPI  Pt in states she hit her left ankle and lower tibia 3 days ago against a chair. She got some swelling to ankle. Some pain on lateral aspect of left ankle.   Pt states rt leg started to swell 2 days ago. Pt slept last night with pillow under her legs and states both lower ext less swollen.  Pt weight is stable. No sob and no chest pain.  No recent chest xray. Echo in march showed slight decrease EF 40-45%.  No fh of dvt. No popliteal pain.      Review of Systems  Constitutional: Negative for fever, chills and fatigue.  Respiratory: Negative for cough, chest tightness, shortness of breath and wheezing.   Cardiovascular: Negative for chest pain and palpitations.  Musculoskeletal: Negative for back pain.       :Lt ankle and tibia pain  Skin: Negative for rash.  Psychiatric/Behavioral: Negative for behavioral problems and confusion.    Past Medical History  Diagnosis Date  . Cardiomyopathy     had normal cath 2007 w/ EF 52%; normal EF per echo 04/2013  . Hx of colonic polyps   . Hypertension   . Osteopenia   . Anemia   . Osteoarthritis   . Anxiety and depression 07/16/2009  . ASD (atrial septal defect)      per Bubble study 2004     Social History   Social History  . Marital Status: Widowed    Spouse Name: N/A  . Number of Children: 1  . Years of Education: N/A   Occupational History  . retired--Motivational Speaker    Social History Main Topics  . Smoking status: Never Smoker   . Smokeless tobacco: Never Used  . Alcohol Use: No  . Drug Use: No  . Sexual Activity: Not Currently   Other Topics Concern  . Not on file   Social History Narrative   Widow, moved to [facility] 2007.   Has a daughter Vikki Ports and a G-d in [place]; lost her son-in-law, moved w/ daughter 2014 due to depression        Past Surgical History  Procedure Laterality Date  . Tonsillectomy     . Back surgery  2000    due to fall  . Breast surgery      negative Bx    Family History  Problem Relation Age of Onset  . Diabetes Neg Hx   . Cancer Neg Hx     colon or breast  . Heart attack Father     F and 2 brothers (no early onset)  . Hypertension Other   . Heart disease Mother     Allergies  Allergen Reactions  . Shellfish Allergy Anaphylaxis  . Codeine     Unknown, per pt   . Hydrocodone     Unknown, per pt  . Adhesive [Tape] Rash    Current Outpatient Prescriptions on File Prior to Visit  Medication Sig Dispense Refill  . amLODipine (NORVASC) 10 MG tablet Take 1 tablet (10 mg total) by mouth daily. 30 tablet 12  . aspirin 81 MG tablet Take 81 mg by mouth daily.    Marland Kitchen augmented betamethasone dipropionate (DIPROLENE-AF) 0.05 % cream Apply topically 2 (two) times daily. 60 g 0  . buPROPion (WELLBUTRIN XL) 150 MG 24 hr tablet Take 450 mg by mouth every morning.     Marland Kitchen  carvedilol (COREG) 12.5 MG tablet Take 1 tablet (12.5 mg total) by mouth 2 (two) times daily with a meal. 180 tablet 0  . cephALEXin (KEFLEX) 500 MG capsule Take 1 capsule (500 mg total) by mouth 4 (four) times daily. 20 capsule 0  . clonazePAM (KLONOPIN) 0.5 MG tablet Take 1.5 mg by mouth at bedtime.    . cycloSPORINE (RESTASIS) 0.05 % ophthalmic emulsion Place 1 drop into both eyes 2 (two) times daily. Reported on 03/20/2016    . latanoprost (XALATAN) 0.005 % ophthalmic solution Place 1 drop into both eyes every evening.    Marland Kitchen losartan (COZAAR) 100 MG tablet Take 1 tablet (100 mg total) by mouth daily. 90 tablet 3  . mirtazapine (REMERON) 30 MG tablet Take 30 mg by mouth at bedtime.     . Multiple Vitamin (MULTIVITAMIN) tablet Take 1 tablet by mouth every morning.     . polyethylene glycol (MIRALAX / GLYCOLAX) packet Take 17 g by mouth 3 (three) times a week. Mon, wed, and fri     No current facility-administered medications on file prior to visit.    BP 120/80 mmHg  Pulse 77  Temp(Src) 97.8 F  (36.6 C) (Oral)  Ht  (1.651 m)  Wt 144 lb 3.2 oz (65.409 kg)  BMI 24.00 kg/m2  SpO2 98%       Objective:   Physical Exam  General Mental Status- Alert. General Appearance- Not in acute distress.   Skin General: Color- Normal Color. Moisture- Normal Moisture.  Neck Carotid Arteries- Normal color. Moisture- Normal Moisture. No carotid bruits. No JVD.  Chest and Lung Exam Auscultation: Breath Sounds:-Normal.(lying supine no dyspnea)  Cardiovascular Auscultation:Rythm- Regular. Murmurs & Other Heart Sounds:Auscultation of the heart reveals- No Murmurs.  Abdomen Inspection:-Inspeection Normal. Palpation/Percussion:Note:No mass. Palpation and Percussion of the abdomen reveal- Non Tender, Non Distended + BS, no rebound or guarding.  Neurologic Cranial Nerve exam:- CN III-XII intact(No nystagmus), symmetric smile. Strength:- 5/5 equal and symmetric strength both upper and lower extremities.  Lower ext- bilateral negative homans signs. Normal dorsalis pedis and posterior tibial pulse. 1+ pedal edema at best. No warmth to calfs. Appear symmetric.  Lt ankle- medial and lateral malleolus tender to palpation. Distal tibia area tender to palpation.      Assessment & Plan:  For your ankle and tibia pain will get xray today. Use ace wrap as well. Can to RICE therapy. Can use tylenol or ibuprofen for pain. Recommend tylenol first. If use ibuprofen then low dose 200 mg only.(max every 8 hours but only for 2-3 days at most).  For pedal edema will get cxr and bnp. With hx of mild cardiomegaly and mild decreased EF will investigate if any early heart failure with chest xray and labs.  If you have any pain behind knees then you would need evaluation at ED immediatley to evaluate for possible DVT.   Follow up in 7-10 days or as needed  Nabeeha Badertscher, Ramon Dredge, [employer]

## 2016-05-26 NOTE — Patient Instructions (Signed)
For your ankle and tibia pain will get xray today. Use ace wrap as well. Can to RICE therapy. Can use tylenol or ibuprofen for pain. Recommend tylenol first. If use ibuprofen then low dose 200 mg only.(max every 8 hours but only for 2-3 days at most).  For pedal edema will get cxr and bnp. With hx of mild cardiomegaly and mild decreased EF will investigate if any early heart failure with chest xray and labs.  If you have any pain behind knees then you would need evaluation at ED immediatley to evaluate for possible DVT.   Follow up in 7-10 days or as needed

## 2016-05-29 NOTE — Progress Notes (Signed)
Quick Note:  Pt has seen results on MyChart and message also sent for patient to call back if any questions. ______ 

## 2016-06-04 ENCOUNTER — Other Ambulatory Visit: Payer: Self-pay | Admitting: Internal Medicine

## 2016-07-18 ENCOUNTER — Encounter: Payer: Self-pay | Admitting: Internal Medicine

## 2016-07-18 ENCOUNTER — Ambulatory Visit (INDEPENDENT_AMBULATORY_CARE_PROVIDER_SITE_OTHER): Payer: Medicare Other | Admitting: Internal Medicine

## 2016-07-18 VITALS — BP 126/62 | HR 81 | Temp 97.9°F | Resp 14 | Ht 65.0 in | Wt 145.2 lb

## 2016-07-18 DIAGNOSIS — Z23 Encounter for immunization: Secondary | ICD-10-CM

## 2016-07-18 DIAGNOSIS — R600 Localized edema: Secondary | ICD-10-CM

## 2016-07-18 DIAGNOSIS — I10 Essential (primary) hypertension: Secondary | ICD-10-CM | POA: Diagnosis not present

## 2016-07-18 MED ORDER — CARVEDILOL 25 MG PO TABS: 25.0000 mg | ORAL_TABLET | Freq: Two times a day (BID) | ORAL | 6 refills | Status: DC

## 2016-07-18 NOTE — Progress Notes (Signed)
Subjective:    Patient ID: [name] [name], female    DOB: 01/29/1927, 82 y.o.   MRN: [id number]  DOS:  07/18/2016 Type of visit - description : rov Interval history: Was seen 05/26/2016 with LE edema, chest x-ray negative, BNP satisfactory. Edema about the same. Meds reviewed: Good compliance.   Wt Readings from Last 3 Encounters:  07/18/16 145 lb 4 oz (65.9 kg)  05/26/16 144 lb 3.2 oz (65.4 kg)  05/02/16 142 lb 8 oz (64.6 kg)     Review of Systems Denies chest pain or difficulty breathing No orthopnea  Past Medical History:  Diagnosis Date  . Anemia   . Anxiety and depression 07/16/2009  . ASD (atrial septal defect)     per Bubble study 2004  . Cardiomyopathy    had normal cath 2007 w/ EF 52%; normal EF per echo 04/2013  . Hx of colonic polyps   . Hypertension   . Osteoarthritis   . Osteopenia     Past Surgical History:  Procedure Laterality Date  . BACK SURGERY  2000   due to fall  . BREAST SURGERY     negative Bx  . TONSILLECTOMY      Social History   Social History  . Marital status: Widowed    Spouse name: N/A  . Number of children: 1  . Years of education: N/A   Occupational History  . retired--Motivational Speaker Retired   Social History Main Topics  . Smoking status: Never Smoker  . Smokeless tobacco: Never Used  . Alcohol use No  . Drug use: No  . Sexual activity: Not Currently   Other Topics Concern  . Not on file   Social History Narrative   [place] [place]   Has a daughter [name] [name] and a G-d in [place]; lost her son-in-law, moved w/ daughter 2014 due to depression            Medication List       Accurate as of 07/18/16  6:15 PM. Always use your most recent med list.          aspirin 81 MG tablet Take 81 mg by mouth daily.   buPROPion 150 MG 24 hr tablet Commonly known as:  WELLBUTRIN XL Take 450 mg by mouth every morning.   carvedilol 25 MG tablet Commonly known as:  COREG Take 1 tablet (25 mg total) by  mouth 2 (two) times daily with a meal.   clonazePAM 0.5 MG tablet Commonly known as:  KLONOPIN Take 1.5 mg by mouth at bedtime.   cycloSPORINE 0.05 % ophthalmic emulsion Commonly known as:  RESTASIS Place 1 drop into both eyes 2 (two) times daily. Reported on 03/20/2016   latanoprost 0.005 % ophthalmic solution Commonly known as:  XALATAN Place 1 drop into both eyes every evening.   losartan 100 MG tablet Commonly known as:  COZAAR Take 1 tablet (100 mg total) by mouth daily.   mirtazapine 30 MG tablet Commonly known as:  REMERON Take 30 mg by mouth at bedtime.   multivitamin tablet Take 1 tablet by mouth every morning.   polyethylene glycol packet Commonly known as:  MIRALAX / GLYCOLAX Take 17 g by mouth 3 (three) times a week. Mon, wed, and fri          Objective:   Physical Exam BP 126/62 (BP Location: Left Arm, Patient Position: Sitting, Cuff Size: Small)   Pulse 81   Temp 97.9 F (36.6 C) (Oral)  Resp 14   Ht 5\' 5"  (1.651 m)   Wt 145 lb 4 oz (65.9 kg)   SpO2 97%   BMI 24.17 kg/m  General:   Well developed, well nourished . NAD.  HEENT:  Normocephalic . Face symmetric, atraumatic Neck: No JVD at 45 Lungs:  CTA B Normal respiratory effort, no intercostal retractions, no accessory muscle use. Heart: RRR,  no murmur.  +/+++ pretibial edema bilaterally , from the mid pretibial area down to the ankles. Good pedal pulses. Skin: Not pale. Not jaundice Neurologic:  alert & oriented X3.  Speech normal, gait appropriate for age and unassisted Psych--  Cognition and judgment appear intact.  Cooperative with normal attention span and concentration.  Behavior appropriate. No anxious or depressed appearing.      Assessment & Plan:  Assessment > Prediabetes HTN DJD Mild CRI creatinine 1.3, 1.4 Anxiety depression onset 2010 -- Dr Donell Beers Insomnia -on clonazepam rx by pcp Osteopenia --T score 2011 normal, T score 02-2013 -1.1 HOH-- aids rx 2016 CV: --ASD  per bubble study 2004 --Cardiomyopathy? Normal 2007 EF 52%, normal EF per echo H/o anemia -- normal iron  03-2015  PLAN Edema: Due to amlodipine?. Recommend to discontinue amlodipine, increase carvedilol dose to 25 mg twice a day, monitor BPs and let me know if not better HTN: See above. Follow-up 2 months

## 2016-07-18 NOTE — Progress Notes (Signed)
Pre visit review using our clinic review tool, if applicable. No additional management support is needed unless otherwise documented below in the visit note. 

## 2016-07-18 NOTE — Patient Instructions (Signed)
Stop amlodipine  Continue with carvedilol, will increase the dose to 25 mg tablets: 1 tablet twice a day   Check the  blood pressure 2 or 3 times a   Week  Be sure your blood pressure is between 110/65 and  145/85. If it is consistently higher or lower, let me know

## 2016-07-18 NOTE — Assessment & Plan Note (Signed)
Edema: Due to amlodipine?. Recommend to discontinue amlodipine, increase carvedilol dose to 25 mg twice a day, monitor BPs and let me know if not better HTN: See above. Follow-up 2 months

## 2016-08-15 ENCOUNTER — Telehealth: Payer: Self-pay | Admitting: Cardiovascular Disease

## 2016-08-15 NOTE — Telephone Encounter (Signed)
Patient calling about her MRI. Patient wanted to know date and time to be at [facility] for her test. Informed patient that she is to be there at 7:30 am on 08/30/16. Patient verbalized understanding.

## 2016-08-15 NOTE — Telephone Encounter (Signed)
New Message:    Please call,concerning a procedure she is going to have soon. She says she have some questions she needs to ask.Marland Kitchen

## 2016-08-23 ENCOUNTER — Other Ambulatory Visit: Payer: Self-pay | Admitting: Internal Medicine

## 2016-08-25 ENCOUNTER — Ambulatory Visit (INDEPENDENT_AMBULATORY_CARE_PROVIDER_SITE_OTHER): Payer: Medicare Other | Admitting: Internal Medicine

## 2016-08-25 ENCOUNTER — Encounter: Payer: Self-pay | Admitting: Internal Medicine

## 2016-08-25 ENCOUNTER — Ambulatory Visit (HOSPITAL_BASED_OUTPATIENT_CLINIC_OR_DEPARTMENT_OTHER)
Admission: RE | Admit: 2016-08-25 | Discharge: 2016-08-25 | Disposition: A | Discharge status: Home / Self Care | Payer: Medicare Other | Source: Ambulatory Visit | Attending: Internal Medicine | Admitting: Internal Medicine

## 2016-08-25 VITALS — BP 142/76 | HR 80 | Temp 97.8°F | Resp 14 | Ht 65.0 in | Wt 147.5 lb

## 2016-08-25 DIAGNOSIS — S40012A Contusion of left shoulder, initial encounter: Secondary | ICD-10-CM

## 2016-08-25 DIAGNOSIS — M25551 Pain in right hip: Secondary | ICD-10-CM | POA: Insufficient documentation

## 2016-08-25 DIAGNOSIS — S7001XA Contusion of right hip, initial encounter: Secondary | ICD-10-CM | POA: Diagnosis not present

## 2016-08-25 DIAGNOSIS — W19XXXA Unspecified fall, initial encounter: Secondary | ICD-10-CM

## 2016-08-25 NOTE — Patient Instructions (Addendum)
Get your x-rays downstairs  For pain take Tylenol 500 mg 2 tablets every 8 hours as needed  Ice pack to the right hip  Keep the wound clean and dry  Use a cane   If you have more accidents or severe pain at the neck, head, hip: Go to the ER   Fall Prevention in the Home  Falls can cause injuries and can affect people from all age groups. There are many simple things that you can do to make your home safe and to help prevent falls. WHAT CAN I DO ON THE OUTSIDE OF MY HOME?  Regularly repair the edges of walkways and driveways and fix any cracks.  Remove high doorway thresholds.  Trim any shrubbery on the main path into your home.  Use bright outdoor lighting.  Clear walkways of debris and clutter, including tools and rocks.  Regularly check that handrails are securely fastened and in good repair. Both sides of any steps should have handrails.  Install guardrails along the edges of any raised decks or porches.  Have leaves, snow, and ice cleared regularly.  Use sand or salt on walkways during winter months.  In the garage, clean up any spills right away, including grease or oil spills. WHAT CAN I DO IN THE BATHROOM?  Use night lights.  Install grab bars by the toilet and in the tub and shower. Do not use towel bars as grab bars.  Use non-skid mats or decals on the floor of the tub or shower.  If you need to sit down while you are in the shower, use a plastic, non-slip stool.Marland Kitchen.  Keep the floor dry. Immediately clean up any water that spills on the floor.  Remove soap buildup in the tub or shower on a regular basis.  Attach bath mats securely with double-sided non-slip rug tape.  Remove throw rugs and other tripping hazards from the floor. WHAT CAN I DO IN THE BEDROOM?  Use night lights.  Make sure that a bedside light is easy to reach.  Do not use oversized bedding that drapes onto the floor.  Have a firm chair that has side arms to use for getting  dressed.  Remove throw rugs and other tripping hazards from the floor. WHAT CAN I DO IN THE KITCHEN?   Clean up any spills right away.  Avoid walking on wet floors.  Place frequently used items in easy-to-reach places.  If you need to reach for something above you, use a sturdy step stool that has a grab bar.  Keep electrical cables out of the way.  Do not use floor polish or wax that makes floors slippery. If you have to use wax, make sure that it is non-skid floor wax.  Remove throw rugs and other tripping hazards from the floor. WHAT CAN I DO IN THE STAIRWAYS?  Do not leave any items on the stairs.  Make sure that there are handrails on both sides of the stairs. Fix handrails that are broken or loose. Make sure that handrails are as long as the stairways.  Check any carpeting to make sure that it is firmly attached to the stairs. Fix any carpet that is loose or worn.  Avoid having throw rugs at the top or bottom of stairways, or secure the rugs with carpet tape to prevent them from moving.  Make sure that you have a light switch at the top of the stairs and the bottom of the stairs. If you do not have them,  have them installed. WHAT ARE SOME OTHER FALL PREVENTION TIPS?  Wear closed-toe shoes that fit well and support your feet. Wear shoes that have rubber soles or low heels.  When you use a stepladder, make sure that it is completely opened and that the sides are firmly locked. Have someone hold the ladder while you are using it. Do not climb a closed stepladder.  Add color or contrast paint or tape to grab bars and handrails in your home. Place contrasting color strips on the first and last steps.  Use mobility aids as needed, such as canes, walkers, scooters, and crutches.  Turn on lights if it is dark. Replace any light bulbs that burn out.  Set up furniture so that there are clear paths. Keep the furniture in the same spot.  Fix any uneven floor surfaces.  Choose a  carpet design that does not hide the edge of steps of a stairway.  Be aware of any and all pets.  Review your medicines with your healthcare provider. Some medicines can cause dizziness or changes in blood pressure, which increase your risk of falling. Talk with your health care provider about other ways that you can decrease your risk of falls. This may include working with a physical therapist or trainer to improve your strength, balance, and endurance.   This information is not intended to replace advice given to you by your health care provider. Make sure you discuss any questions you have with your health care provider.   Document Released: 10/20/2002 Document Revised: 03/16/2015 Document Reviewed: 12/04/2014 Elsevier Interactive Patient Education [employer]

## 2016-08-25 NOTE — Progress Notes (Signed)
Pre visit review using our clinic review tool, if applicable. No additional management support is needed unless otherwise documented below in the visit note. 

## 2016-08-25 NOTE — Progress Notes (Signed)
Subjective:    Patient ID: [name] [name], female    DOB: [birth year], 82 y.o.   MRN: [id number]  DOS:  08/25/2016 Type of visit - description : acute Interval history: Yesterday, she was going downstairs, her heel got caught up and she fell 2 steps, landed on her left side, had left shoulder after that. Today, her left foot slipped and she fell landing on her right hip, right arm and bumped her right forehead. She did bleed from the right hip scratch.  Denies any syncope, loss of consciousness, both falls were mechanical. At this point pain is really mild at the left shoulder, right hip.   Review of Systems Denies headache, nausea, vomiting. No neck or back pain No lower extremity numbness.  Past Medical History:  Diagnosis Date  . Anemia   . Anxiety and depression 07/16/2009  . ASD (atrial septal defect)     per Bubble study 2004  . Cardiomyopathy    had normal cath 2007 w/ EF 52%; normal EF per echo 04/2013  . Hx of colonic polyps   . Hypertension   . Osteoarthritis   . Osteopenia     Past Surgical History:  Procedure Laterality Date  . BACK SURGERY  2000   due to fall  . BREAST SURGERY     negative Bx  . TONSILLECTOMY      Social History   Social History  . Marital status: Widowed    Spouse name: N/A  . Number of children: 1  . Years of education: N/A   Occupational History  . retired--Motivational Speaker Retired   Social History Main Topics  . Smoking status: Never Smoker  . Smokeless tobacco: Never Used  . Alcohol use No  . Drug use: No  . Sexual activity: Not Currently   Other Topics Concern  . Not on file   Social History Narrative   Widow, moved to [facility] [place].   Has a daughter Vikki Ports and a G-d in [place]; lost her son-in-law, moved w/ daughter 2014 due to depression            Medication List       Accurate as of 08/25/16  2:20 PM. Always use your most recent med list.          aspirin 81 MG tablet Take 81 mg by mouth daily.   buPROPion 150 MG 24 hr tablet Commonly known as:  WELLBUTRIN XL Take 450 mg by mouth every morning.   carvedilol 25 MG tablet Commonly known as:  COREG Take 1 tablet (25 mg total) by mouth 2 (two) times daily with a meal.   clonazePAM 0.5 MG tablet Commonly known as:  KLONOPIN Take 1.5 mg by mouth at bedtime.   cycloSPORINE 0.05 % ophthalmic emulsion Commonly known as:  RESTASIS Place 1 drop into both eyes 2 (two) times daily. Reported on 03/20/2016   latanoprost 0.005 % ophthalmic solution Commonly known as:  XALATAN Place 1 drop into both eyes every evening.   losartan 100 MG tablet Commonly known as:  COZAAR Take 1 tablet (100 mg total) by mouth daily.   mirtazapine 30 MG tablet Commonly known as:  REMERON Take 30 mg by mouth at bedtime.   multivitamin tablet Take 1 tablet by mouth every morning.   polyethylene glycol packet Commonly known as:  MIRALAX / GLYCOLAX Take 17 g by mouth 3 (three) times a week. Mon, wed, and fri          Objective:  Physical Exam  Musculoskeletal:       Legs: Skin:      BP (!) 142/76 (BP Location: Left Arm, Patient Position: Sitting, Cuff Size: Normal)   Pulse 80   Temp 97.8 F (36.6 C) (Oral)   Resp 14   Ht 5\' 5"  (1.651 m)   Wt 147 lb 8 oz (66.9 kg)   SpO2 96%   BMI 24.55 kg/m  General:   Well developed, well nourished . NAD.  HEENT:  Normocephalic . Face symmetric, atraumatic. No bruises or ecchymosis Neck: No TTP of the cervical spine and full range of motion MSK: Shoulders symmetric, range of motion with minimal decrease of the left arm elevation. Hips: Normal rotation bilateral without pain. + Swelling of the soft tissue, right hip. See graphic Neurologic:  alert & oriented X3.  Speech normal, gait   unassisted and not far from baseline. Strength symmetric Psych--  Cognition and judgment appear intact.  Cooperative with normal attention span and concentration.  Behavior appropriate. No anxious or depressed  appearing.      Assessment & Plan:    Assessment > Prediabetes HTN-- amlodipine d/c 07-2016 (edema) DJD Mild CRI creatinine 1.3, 1.4 Anxiety depression onset 2010 -- Dr Donell BeersPlovsky Insomnia -on clonazepam rx by pcp Osteopenia --T score 2011 normal, T score 02-2013 -1.1 HOH-- aids rx 2016 CV: --ASD per bubble study 2004 --Cardiomyopathy? Normal 2007 EF 52%, normal EF per echo H/o anemia -- normal iron  03-2015  PLAN Mechanical falls 2: No evidence of syncope, neck/head/back injury. She does have a right hematoma at the hip area and likely mild contusion of the left shoulder. We talk about fall prevention, encouraged to use a cane, PT referral. If further injuries needs to go to the ER; get a X-ray right hip. Tylenol for pain. She lives with her daughter, I asked her to be with somebody for the next few days and avoid any activities other than ADL.   Wound was dressed with 4 x 4. Care d/w pt's daughter over the phone

## 2016-08-28 ENCOUNTER — Telehealth: Payer: Self-pay | Admitting: Cardiovascular Disease

## 2016-08-28 ENCOUNTER — Encounter: Payer: Self-pay | Admitting: Internal Medicine

## 2016-08-28 NOTE — Telephone Encounter (Signed)
New message:    Pt is scheduled for a MRI on this Wednesday at [facility]. She fell and hurt rt hip,pt wants to know if it is alright for have MRI?

## 2016-08-28 NOTE — Telephone Encounter (Signed)
Left message for patient to call back  

## 2016-08-28 NOTE — Telephone Encounter (Signed)
Follow up ° ° ° ° ° °Returning call to nurse °

## 2016-08-28 NOTE — Telephone Encounter (Signed)
Pt is calling back

## 2016-08-28 NOTE — Assessment & Plan Note (Signed)
Mechanical falls 2: No evidence of syncope, neck/head/back injury. She does have a right hematoma at the hip area and likely mild contusion of the left shoulder. We talk about fall prevention, encouraged to use a cane, PT referral. If further injuries needs to go to the ER; get a X-ray right hip. Tylenol for pain. She lives with her daughter, I asked her to be with somebody for the next few days and avoid any activities other than ADL.   Wound was dressed with 4 x 4. Care d/w pt's daughter over the phone

## 2016-08-28 NOTE — Telephone Encounter (Signed)
Patient called back. Patient stated she had a fall last week. Patient stated she just had some bruising on her right hip and nothing was broken. Patient was concerned that this might interfere with her MRI. Informed patient that she should be fine having her MRI on Wednesday since nothing is broken or fractured. Patient verbalized understanding.

## 2016-08-30 ENCOUNTER — Ambulatory Visit (HOSPITAL_COMMUNITY)
Admission: RE | Admit: 2016-08-30 | Discharge: 2016-08-30 | Disposition: A | Discharge status: Home / Self Care | Payer: Medicare Other | Source: Ambulatory Visit | Attending: Cardiovascular Disease | Admitting: Cardiovascular Disease

## 2016-08-30 ENCOUNTER — Encounter: Payer: Self-pay | Admitting: Cardiovascular Disease

## 2016-08-30 DIAGNOSIS — I429 Cardiomyopathy, unspecified: Secondary | ICD-10-CM | POA: Insufficient documentation

## 2016-08-30 LAB — CREATININE, SERUM
CREATININE: 1.26 mg/dL — AB (ref 0.44–1.00)
GFR calc Af Amer: 43 mL/min — ABNORMAL LOW (ref >60)
GFR calc non Af Amer: 37 mL/min — ABNORMAL LOW (ref >60)

## 2016-08-30 MED ORDER — GADOBENATE DIMEGLUMINE 529 MG/ML IV SOLN
20.0000 mL | Freq: Once | INTRAVENOUS | Status: AC
  Administered 2016-08-30: 25 mL via INTRAVENOUS

## 2016-08-31 ENCOUNTER — Telehealth: Payer: Self-pay | Admitting: Cardiovascular Disease

## 2016-08-31 NOTE — Telephone Encounter (Signed)
Notified of cardiac MRI.  Verbalizes understanding to continue Losartan and Carvedilol.Marland Kitchen

## 2016-08-31 NOTE — Telephone Encounter (Signed)
New message    Pt verbalized that she is calling to speak to rn about her MRI

## 2016-09-01 ENCOUNTER — Ambulatory Visit: Payer: Medicare Other | Admitting: Physical Therapy

## 2016-09-04 ENCOUNTER — Ambulatory Visit: Payer: Medicare Other | Attending: Internal Medicine | Admitting: Physical Therapy

## 2016-09-04 ENCOUNTER — Encounter: Payer: Self-pay | Admitting: Physical Therapy

## 2016-09-04 DIAGNOSIS — M6281 Muscle weakness (generalized): Secondary | ICD-10-CM | POA: Insufficient documentation

## 2016-09-04 DIAGNOSIS — R2689 Other abnormalities of gait and mobility: Secondary | ICD-10-CM | POA: Insufficient documentation

## 2016-09-04 NOTE — Patient Instructions (Signed)
Fall Prevention and Home Safety Falls cause injuries and can affect all age groups. It is possible to use preventive measures to significantly decrease the likelihood of falls. There are many simple measures which can make your home safer and prevent falls. OUTDOORS  Repair cracks and edges of walkways and driveways.  Remove high doorway thresholds.  Trim shrubbery on the main path into your home.  Have good outside lighting.  Clear walkways of tools, rocks, debris, and clutter.  Check that handrails are not broken and are securely fastened. Both sides of steps should have handrails.  Have leaves, snow, and ice cleared regularly.  Use sand or salt on walkways during winter months.  In the garage, clean up grease or oil spills. BATHROOM  Install night lights.  Install grab bars by the toilet and in the tub and shower.  Use non-skid mats or decals in the tub or shower.  Place a plastic non-slip stool in the shower to sit on, if needed.  Keep floors dry and clean up all water on the floor immediately.  Remove soap buildup in the tub or shower on a regular basis.  Secure bath mats with non-slip, double-sided rug tape.  Remove throw rugs and tripping hazards from the floors. BEDROOMS  Install night lights.  Make sure a bedside light is easy to reach.  Do not use oversized bedding.  Keep a telephone by your bedside.  Have a firm chair with side arms to use for getting dressed.  Remove throw rugs and tripping hazards from the floor. KITCHEN  Keep handles on pots and pans turned toward the center of the stove. Use back burners when possible.  Clean up spills quickly and allow time for drying.  Avoid walking on wet floors.  Avoid hot utensils and knives.  Position shelves so they are not too high or low.  Place commonly used objects within easy reach.  If necessary, use a sturdy step stool with a grab bar when reaching.  Keep electrical cables out of the  way.  Do not use floor polish or wax that makes floors slippery. If you must use wax, use non-skid floor wax.  Remove throw rugs and tripping hazards from the floor. STAIRWAYS  Never leave objects on stairs.  Place handrails on both sides of stairways and use them. Fix any loose handrails. Make sure handrails on both sides of the stairways are as long as the stairs.  Check carpeting to make sure it is firmly attached along stairs. Make repairs to worn or loose carpet promptly.  Avoid placing throw rugs at the top or bottom of stairways, or properly secure the rug with carpet tape to prevent slippage. Get rid of throw rugs, if possible.  Have an electrician put in a light switch at the top and bottom of the stairs. OTHER FALL PREVENTION TIPS  Wear low-heel or rubber-soled shoes that are supportive and fit well. Wear closed toe shoes.  When using a stepladder, make sure it is fully opened and both spreaders are firmly locked. Do not climb a closed stepladder.  Add color or contrast paint or tape to grab bars and handrails in your home. Place contrasting color strips on first and last steps.  Learn and use mobility aids as needed. Install an electrical emergency response system.  Turn on lights to avoid dark areas. Replace light bulbs that burn out immediately. Get light switches that glow.  Arrange furniture to create clear pathways. Keep furniture in the same place.    Firmly attach carpet with non-skid or double-sided tape.  Eliminate uneven floor surfaces.  Select a carpet pattern that does not visually hide the edge of steps.  Be aware of all pets. OTHER HOME SAFETY TIPS  Set the water temperature for 120 F (48.8 C).  Keep emergency numbers on or near the telephone.  Keep smoke detectors on every level of the home and near sleeping areas. Document Released: 10/20/2002 Document Revised: 04/30/2012 Document Reviewed: 01/19/2012 [facility] Patient Information 2015  [place], [place]. This information is not intended to replace advice given to you by your health care provider. Make sure you discuss any questions you have with your health care provider.  Tandem Stance    Can hold onto counter if not feeling steady. Right foot in front of left, heel touching toe both feet "straight ahead". Stand on Foot Triangle of Support with both feet. Balance in this position _30__ seconds. Do with left foot in front of right. 3 times each  Copyright  VHI. All rights reserved.    Stance: single leg on floor. Raise leg. Hold _15__ seconds. Repeat with other leg. __3_ reps per set, _1__ sets per day .  Can hold onto counter to keep steady  Copyright  VHI. All rights reserved.  [facility] [address] [place], [place] [address] Phone # [phone] Fax [phone]

## 2016-09-04 NOTE — Therapy (Signed)
[facility] [address], [address] [place], [place], [address] Phone: [phone]   Fax:  [phone]  Physical Therapy Evaluation  Patient Details  Name: [name] [name] MRN: [id number] Date of Birth: 12-11-1926 Referring Provider: Dr. Willow Ora  Encounter Date: 09/04/2016      PT End of Session - 09/04/16 1546    Visit Number 1   Number of Visits 10   Date for PT Re-Evaluation 10/30/16   Authorization Type medicare g-code 10th visit   PT Start Time 1505   PT Stop Time 1545   PT Time Calculation (min) 40 min   Activity Tolerance Patient tolerated treatment well   Behavior During Therapy [facility] for tasks assessed/performed      Past Medical History:  Diagnosis Date  . Anemia   . Anxiety and depression 07/16/2009  . ASD (atrial septal defect)     per Bubble study 2004  . Cardiomyopathy    had normal cath 2007 w/ EF 52%; normal EF per echo 04/2013  . Hx of colonic polyps   . Hypertension   . Osteoarthritis   . Osteopenia     Past Surgical History:  Procedure Laterality Date  . BACK SURGERY  2000   due to fall  . BREAST SURGERY     negative Bx  . TONSILLECTOMY      There were no vitals filed for this visit.       Subjective Assessment - 09/04/16 1514    Subjective Patient reports 08/24/2016 going down steps.  She missed two steps and fell on the left side and hit head on floor.  08/25/2016 she was trying to fix her rug and fell on right side.    Diagnostic tests x-rays no fractures   Patient Stated Goals reduce falls   Currently in Pain? No/denies   Multiple Pain Sites No            [facility] PT Assessment - 09/04/16 0001      Assessment   Medical Diagnosis [email or web address] Matar, Initial encounter   Referring Provider Dr. Willow Ora   Onset Date/Surgical Date 08/24/16   Prior Therapy None     Precautions   Precautions Fall     Restrictions   Weight Bearing Restrictions No     Balance Screen   Has  the patient fallen in the past 6 months Yes   How many times? 2   Has the patient had a decrease in activity level because of a fear of falling?  No   Is the patient reluctant to leave their home because of a fear of falling?  No     Home Environment   Living Environment Private residence   Type of Home House   Home Access Stairs to enter   Entrance Stairs-Number of Steps 4   Home Layout Two level   Alternate Level Stairs-Number of Steps 15   Alternate Level Stairs-Rails Left;Right     Prior Function   Level of Independence Independent   Vocation Retired   Leisure yoga, aerobics at [employer]   Overall Cognitive Status Within Functional Limits for tasks assessed     Observation/Other Assessments   Focus on Therapeutic Outcomes (FOTO)  50% limitation as per berg balance score  goal is 20% limitation     Posture/Postural Control   Posture/Postural Control Postural limitations   Postural Limitations Flexed trunk     Ambulation/Gait   Ambulation/Gait Yes  Assistive device None   Gait Pattern Decreased step length - right;Decreased step length - left  decreased hip extension bil. decrease heel strike on right.     Standardized Balance Assessment   Standardized Balance Assessment Berg Balance Test;Timed Up and Go Test     Berg Balance Test   Sit to Stand Able to stand without using hands and stabilize independently   Standing Unsupported Able to stand safely 2 minutes   Sitting with Back Unsupported but Feet Supported on Floor or Stool Able to sit safely and securely 2 minutes   Stand to Sit Sits safely with minimal use of hands   Transfers Able to transfer safely, minor use of hands   Standing Unsupported with Eyes Closed Able to stand 10 seconds safely   Standing Ubsupported with Feet Together Able to place feet together independently and stand 1 minute safely   From Standing, Reach Forward with Outstretched Arm Can reach confidently >25 cm (10")   From  Standing Position, Pick up Object from Floor Able to pick up shoe safely and easily   From Standing Position, Turn to Look Behind Over each Shoulder Turn sideways only but maintains balance   Turn 360 Degrees Able to turn 360 degrees safely but slowly  right 6 sec, left 5 sec   Standing Unsupported, Alternately Place Feet on Step/Stool Able to stand independently and safely and complete 8 steps in 20 seconds   Standing Unsupported, One Foot in Front Able to take small step independently and hold 30 seconds   Standing on One Leg Tries to lift leg/unable to hold 3 seconds but remains standing independently   Total Score 47   Berg comment: use cane outside, 50% chance of falling     Timed Up and Go Test   Normal TUG (seconds) 14   TUG Comments high risk of falls                           PT Education - 09/04/16 1545    Education provided Yes   Education Details tips to avoid falls, tandem stance and single leg stance   Person(s) Educated Patient   Methods Explanation;Demonstration;Verbal cues;Handout   Comprehension Returned demonstration;Verbalized understanding          PT Short Term Goals - 09/04/16 1553      PT SHORT TERM GOAL #1   Title independent with initial HEP   Time 4   Period Weeks   Status New     PT SHORT TERM GOAL #2   Title understand the risk of falls   Time 4   Period Weeks   Status New     PT SHORT TERM GOAL #3   Title Berg balance score  has improved >/= 50/56   Time 4   Period Weeks   Status New     PT SHORT TERM GOAL #4   Title confidence to go up and down stairs improved >/= 25%   Time 4   Period Weeks   Status New           PT Long Term Goals - 09/04/16 1554      PT LONG TERM GOAL #1   Title independent with HEP   Time 8   Period Weeks   Status New     PT LONG TERM GOAL #2   Title Berg balance score is >/= 52/56 to reduce her chance of falls   Time 8  Period Weeks   Status New     PT LONG TERM GOAL #3    Title TUG score is </= 13 seconds   Time 8   Period Weeks   Status New     PT LONG TERM GOAL #4   Title confidence to go up and down stairs has improved >/= 50%   Time 8   Period Weeks   Status New               Plan - 09/12/2016 1547    Clinical Impression Statement Patient is a 82 year old female with diagnosis of falling.  Patient has falling on 08/24/2016 when she missed 2 steps going down the stairs ontl her left side. On 08/25/2016, she fell on her left side while fixing a rug with her foot. Berg balance score is 47/56 indicating a 50% chance of falls.  TUG score is 14 seconds indicating a high risk of falls. Patient ambulates with small step length, and decreased in hip extension.  Patient is of low complexity evalution due to being stable and no comorbidities that will impact her care provided.  Patient will benefit from skilled therapy to improve her balance to decrease her risk of falls.    Rehab Potential Excellent   Clinical Impairments Affecting Rehab Potential risk of falls   PT Frequency 2x / week   PT Duration 8 weeks   PT Treatment/Interventions Moist Heat;Cryotherapy;Stair training;Gait training;Therapeutic activities;Therapeutic exercise;Balance training;Neuromuscular re-education;Patient/family education;Passive range of motion;Manual techniques   PT Next Visit Plan hip stretches for gastroc, hamstring, piriformis, hip flexor, balance exercises for tandem stance, single leg stance, steps   PT Home Exercise Plan progress as needed   Recommended Other Services None   Consulted and Agree with Plan of Care Patient      Patient will benefit from skilled therapeutic intervention in order to improve the following deficits and impairments:  Abnormal gait, Difficulty walking, Decreased strength, Decreased balance, Decreased activity tolerance  Visit Diagnosis: Muscle weakness (generalized) - Plan: PT plan of care cert/re-cert  Other abnormalities of gait and mobility -  Plan: PT plan of care cert/re-cert      G-Codes - 09/12/16 1556    Functional Assessment Tool Used Berg balance score is 47/56 indicating 50% chance of falling   Functional Limitation Mobility: Walking and moving around   Mobility: Walking and Moving Around Current Status [phone]) At least 40 percent but less than 60 percent impaired, limited or restricted   Mobility: Walking and Moving Around Goal Status [phone]) At least 20 percent but less than 40 percent impaired, limited or restricted       Problem List Patient Active Problem List   Diagnosis Date Noted  . PCP NOTES >>>>> 09/23/2015  . Left hip pain 09/24/2014  . Left knee pain 09/24/2014  . Anemia 09/14/2014  . Podagra 04/29/2014  . Gait disorder 03/07/2013  . Pre-diabetes 02/10/2013  . Neck sprain 09/02/2012  . Annual physical exam 09/04/2011  . SKIN LESION 08/03/2010  . DJD (degenerative joint disease) 02/09/2010  . PREMATURE VENTRICULAR CONTRACTIONS 07/20/2009  . Anxiety and depression 07/16/2009  . RESTLESS LEG SYNDROME 07/10/2009  . CARDIOMYOPATHY 04/23/2007  . HTN (hypertension) 04/15/2007  . OSTEOPENIA 04/15/2007  . COLONIC POLYPS, HX OF 04/15/2007    Eulis Foster, PT 09-12-16 4:00 PM   [facility] [address] [place], [place], [address] Phone: [phone]   Fax:  [phone]  Name: VONDELL SOWELL MRN:  [id number] Date of Birth: 1926/12/14

## 2016-09-05 ENCOUNTER — Telehealth: Payer: Self-pay | Admitting: Cardiovascular Disease

## 2016-09-05 NOTE — Telephone Encounter (Signed)
Patient had questions about her MRI results and her lab results. Repeated results per Dr. Eden Emms for MRI. Patient also was wondering about her creatinine level being elevated at 1.26. Informed patient that she might be a little dehydrated, and that her creatinine level was slightly higher about a year ago. Informed patient to keep a heart healthy diet. Informed patient that water would be a better substitute to other drinks. Patient verbalized understanding and will call with any other questions.

## 2016-09-05 NOTE — Telephone Encounter (Signed)
Follow Up:    Returning your call from a few days ago,also says she have some questions,

## 2016-09-18 ENCOUNTER — Encounter: Payer: Self-pay | Admitting: Physical Therapy

## 2016-09-18 ENCOUNTER — Ambulatory Visit: Payer: Medicare Other | Attending: Internal Medicine | Admitting: Physical Therapy

## 2016-09-18 DIAGNOSIS — M6281 Muscle weakness (generalized): Secondary | ICD-10-CM | POA: Diagnosis not present

## 2016-09-18 DIAGNOSIS — R2689 Other abnormalities of gait and mobility: Secondary | ICD-10-CM | POA: Insufficient documentation

## 2016-09-18 NOTE — Patient Instructions (Signed)
Heel Raises    Stand with support. With knees straight, raise heels off ground. Hold _2__ seconds. Relax for __2_ seconds. Repeat _10__ times slowly.  Do _2__ times a day.   Lie on back on firm surface, knees bent, feet flat, arms turned up, out to sides (~35 degrees). Head neck and arms supported as necessary. Time _5-15__ minutes. Surface: floor                4. Leg Lengthener: stretches quadratus lumborum and hip flexors.  Strengthens quads and ankle dorsiflexors. Straighten one leg out on the bed, keeping the other knee bent. Reach the heel out of the straight leg feeling like you are stretching the leg longer. Hold for 3 seconds. Do 3x on each leg.   Piriformis Stretch, Supine    Lie on your back, legs bent, feet flat. Raise one bent leg and put it on the opposite knee, pull knee toward opposite shoulder. Hold _10__ seconds.  Repeat _2__ times per session. Do __2_ sessions per day. Perform with other knee bent, NOT LIKE THE PICTURE, you can bed the knee to help support the leg.    #5 Standing at the kitchen counter for safety: balance on one leg for 10 seconds. Make sure the standing leg is strong!! Do 3x on each leg. Then, stand like you are on the balance beam. Hold that position for 10 seconds, keep your legs strong. Do 2x with each leg in the front position.   ABDUCTION: Standing (Active)    Stand, feet flat, and face your counter top for safety.  Lift right leg out to side then left, alternate. Strong legs!! Complete _2__ sets of 10_ repetitions. Perform _2__ sessions per day.  http://gtsc.exer.us/111   Copyright  VHI. All rights reserved.      Copyright  VHI. All rights reserved.     Copyright  VHI. All rights reserved.

## 2016-09-18 NOTE — Therapy (Signed)
[facility] [address] [place], [place], [address] Phone: [phone]   Fax:  [phone]  Physical Therapy Treatment  Patient Details  Name: [name] [name] MRN: [id number] Date of Birth: 1927/03/30 Referring Provider: Dr. Willow Ora  Encounter Date: 09/18/2016      PT End of Session - 09/18/16 0936    Visit Number 2   Number of Visits 10   Date for PT Re-Evaluation 10/30/16   Authorization Type medicare g-code 10th visit   PT Start Time 0931   PT Stop Time 1016   PT Time Calculation (min) 45 min   Activity Tolerance Patient tolerated treatment well   Behavior During Therapy [facility] for tasks assessed/performed      Past Medical History:  Diagnosis Date  . Anemia   . Anxiety and depression 07/16/2009  . ASD (atrial septal defect)     per Bubble study 2004  . Cardiomyopathy    had normal cath 2007 w/ EF 52%; normal EF per echo 04/2013  . Hx of colonic polyps   . Hypertension   . Osteoarthritis   . Osteopenia     Past Surgical History:  Procedure Laterality Date  . BACK SURGERY  2000   due to fall  . BREAST SURGERY     negative Bx  . TONSILLECTOMY      There were no vitals filed for this visit.      Subjective Assessment - 09/18/16 0934    Subjective No pain, no new complaints.   Currently in Pain? No/denies   Multiple Pain Sites No                         [facility] Adult PT Treatment/Exercise - 09/18/16 0001      High Level Balance   High Level Balance Comments Tandem stance bil 10 sec 2 x each side. CGA for balance, pt just a tad wobbly.      Knee/Hip Exercises: Stretches   Active Hamstring Stretch Both;3 reps;10 seconds   Piriformis Stretch Both;2 reps;10 seconds   Other Knee/Hip Stretches leg lengthener bil 3 sec 3x bil     Knee/Hip Exercises: Aerobic   Nustep L1 x 5 min     Knee/Hip Exercises: Standing   Heel Raises Both;1 set;10 reps   Hip Abduction AROM;Both;1  set;10 reps;Knee straight   SLS Bil 10 sec 2x                PT Education - 09/18/16 0943    Education provided Yes   Education Details HEP: hip kicks, hip stretches, heel raises, tandem stance & single leg stance, leg lengthener   Person(s) Educated Patient   Methods Explanation;Demonstration;Tactile cues;Verbal cues;Handout   Comprehension Verbalized understanding;Returned demonstration          PT Short Term Goals - 09/04/16 1553      PT SHORT TERM GOAL #1   Title independent with initial HEP   Time 4   Period Weeks   Status New     PT SHORT TERM GOAL #2   Title understand the risk of falls   Time 4   Period Weeks   Status New     PT SHORT TERM GOAL #3   Title Berg balance score  has improved >/= 50/56   Time 4   Period Weeks   Status New     PT SHORT TERM GOAL #4   Title confidence to go up  and down stairs improved >/= 25%   Time 4   Period Weeks   Status New           PT Long Term Goals - 09/04/16 1554      PT LONG TERM GOAL #1   Title independent with HEP   Time 8   Period Weeks   Status New     PT LONG TERM GOAL #2   Title Berg balance score is >/= 52/56 to reduce her chance of falls   Time 8   Period Weeks   Status New     PT LONG TERM GOAL #3   Title TUG score is </= 13 seconds   Time 8   Period Weeks   Status New     PT LONG TERM GOAL #4   Title confidence to go up and down stairs has improved >/= 50%   Time 8   Period Weeks   Status New               Plan - 09/18/16 1020    Clinical Impression Statement Pt first treatment session was today. She did not recall doing any HEP after her initial evaluation. Treatment was focused on inital HEP  that focused on balance exercises, hip stretches and leg strength in standing. The left LE looked much weaker than the right with all exercises. Pt was encouraged to work on her standing posture which she could straighten with only verbal cuing. Too soon for meeting goals.     Rehab Potential Excellent   Clinical Impairments Affecting Rehab Potential risk of falls   PT Frequency 2x / week   PT Duration 8 weeks   PT Treatment/Interventions Moist Heat;Cryotherapy;Stair training;Gait training;Therapeutic activities;Therapeutic exercise;Balance training;Neuromuscular re-education;Patient/family education;Passive range of motion;Manual techniques   PT Next Visit Plan Review HEP and progress exercises   Consulted and Agree with Plan of Care Patient      Patient will benefit from skilled therapeutic intervention in order to improve the following deficits and impairments:  Abnormal gait, Difficulty walking, Decreased strength, Decreased balance, Decreased activity tolerance  Visit Diagnosis: Muscle weakness (generalized)  Other abnormalities of gait and mobility     Problem List Patient Active Problem List   Diagnosis Date Noted  . PCP NOTES >>>>> 09/23/2015  . Left hip pain 09/24/2014  . Left knee pain 09/24/2014  . Anemia 09/14/2014  . Podagra 04/29/2014  . Gait disorder 03/07/2013  . Pre-diabetes 02/10/2013  . Neck sprain 09/02/2012  . Annual physical exam 09/04/2011  . SKIN LESION 08/03/2010  . DJD (degenerative joint disease) 02/09/2010  . PREMATURE VENTRICULAR CONTRACTIONS 07/20/2009  . Anxiety and depression 07/16/2009  . RESTLESS LEG SYNDROME 07/10/2009  . CARDIOMYOPATHY 04/23/2007  . HTN (hypertension) 04/15/2007  . OSTEOPENIA 04/15/2007  . COLONIC POLYPS, HX OF 04/15/2007    Francelia Mclaren, PTA 09/18/2016, 11:56 AM  [facility] [address], [address] [place], [place], [address] Phone: [phone]   Fax:  [phone]  Name: [name] [name] MRN: [id number] Date of Birth: 04-16-1927

## 2016-09-19 ENCOUNTER — Encounter: Payer: Self-pay | Admitting: Internal Medicine

## 2016-09-19 ENCOUNTER — Ambulatory Visit (INDEPENDENT_AMBULATORY_CARE_PROVIDER_SITE_OTHER): Payer: Medicare Other | Admitting: Internal Medicine

## 2016-09-19 VITALS — BP 126/70 | HR 76 | Temp 97.8°F | Resp 12 | Ht 65.0 in | Wt 146.1 lb

## 2016-09-19 DIAGNOSIS — S7001XA Contusion of right hip, initial encounter: Secondary | ICD-10-CM

## 2016-09-19 DIAGNOSIS — I10 Essential (primary) hypertension: Secondary | ICD-10-CM | POA: Diagnosis not present

## 2016-09-19 LAB — BASIC METABOLIC PANEL
BUN: 27 mg/dL — ABNORMAL HIGH (ref 6–23)
CALCIUM: 10.9 mg/dL — AB (ref 8.4–10.5)
CO2: 29 meq/L (ref 19–32)
CREATININE: 1.26 mg/dL — AB (ref 0.40–1.20)
Chloride: 104 mEq/L (ref 96–112)
GFR: 51.63 mL/min — ABNORMAL LOW (ref >60.00)
GLUCOSE: 87 mg/dL (ref 70–99)
Potassium: 4 mEq/L (ref 3.5–5.1)
Sodium: 140 mEq/L (ref 135–145)

## 2016-09-19 NOTE — Progress Notes (Signed)
Subjective:    Patient ID: [name] [name], female    DOB: 21-Jan-1927, 82 y.o.   MRN: [id number]  DOS:  09/19/2016 Type of visit - description : f/u Interval history: Since the last visit, hematoma at the right hip is improving, doing physical therapy. No further falls. Takes Remeron, clonazepam usually 2 tablets at bedtime. She gets very sleepy during the night, not much after she wakes up. Medications reviewed, good compliance. Ambulatory BPs when checked are normal.  Review of Systems   Past Medical History:  Diagnosis Date  . Anemia   . Anxiety and depression 07/16/2009  . ASD (atrial septal defect)     per Bubble study 2004  . Cardiomyopathy    had normal cath 2007 w/ EF 52%; normal EF per echo 04/2013  . Hx of colonic polyps   . Hypertension   . Osteoarthritis   . Osteopenia     Past Surgical History:  Procedure Laterality Date  . BACK SURGERY  2000   due to fall  . BREAST SURGERY     negative Bx  . TONSILLECTOMY      Social History   Social History  . Marital status: Widowed    Spouse name: N/A  . Number of children: 1  . Years of education: N/A   Occupational History  . retired--Motivational Speaker Retired   Social History Main Topics  . Smoking status: Never Smoker  . Smokeless tobacco: Never Used  . Alcohol use No  . Drug use: No  . Sexual activity: Not Currently   Other Topics Concern  . Not on file   Social History Narrative   Widow, moved to [facility] 2007.   Has a daughter Vikki Ports and a G-d in [place]; lost her son-in-law, moved w/ daughter 2014 due to depression            Medication List       Accurate as of 09/19/16  6:11 PM. Always use your most recent med list.          aspirin 81 MG tablet Take 81 mg by mouth daily.   buPROPion 150 MG 24 hr tablet Commonly known as:  WELLBUTRIN XL Take 450 mg by mouth every morning.   carvedilol 25 MG tablet Commonly known as:  COREG Take 1 tablet (25 mg total) by mouth 2 (two) times  daily with a meal.   clonazePAM 0.5 MG tablet Commonly known as:  KLONOPIN Take 1 mg by mouth at bedtime.   cycloSPORINE 0.05 % ophthalmic emulsion Commonly known as:  RESTASIS Place 1 drop into both eyes 2 (two) times daily. Reported on 03/20/2016   latanoprost 0.005 % ophthalmic solution Commonly known as:  XALATAN Place 1 drop into both eyes every evening.   losartan 100 MG tablet Commonly known as:  COZAAR Take 1 tablet (100 mg total) by mouth daily.   mirtazapine 30 MG tablet Commonly known as:  REMERON Take 30 mg by mouth at bedtime.   multivitamin tablet Take 1 tablet by mouth every morning.   polyethylene glycol packet Commonly known as:  MIRALAX / GLYCOLAX Take 17 g by mouth 3 (three) times a week. Mon, wed, and fri          Objective:   Physical Exam BP 126/70 (BP Location: Left Arm, Patient Position: Sitting, Cuff Size: Small)   Pulse 76   Temp 97.8 F (36.6 C) (Oral)   Resp 12   Ht 5\' 5"  (1.651 m)   Wt  146 lb 2 oz (66.3 kg)   SpO2 98%   BMI 24.32 kg/m  General:   Well developed, well nourished.  HEENT:  Normocephalic . Face symmetric, atraumatic Lungs:  CTA B Normal respiratory effort, no intercostal retractions, no accessory muscle use. Heart: RRR,  no murmur.  No pretibial edema bilaterally  Skin: Not pale. Not jaundice. Excoriation right hip is healing, dry, no discharge. Neurologic:  alert & oriented X3.  Speech normal, gait : Somewhat hesitant, when she stands she quickly tries to hold to the table to get her steady. Psych--  Cognition and judgment appear intact.  Cooperative with normal attention span and concentration.  Behavior appropriate. No anxious or depressed appearing.      Assessment & Plan:   Assessment > Prediabetes HTN-- amlodipine d/c 07-2016 (edema) DJD Mild CRI creatinine 1.3, 1.4 Anxiety depression onset 2010 -- Dr Donell BeersPlovsky Insomnia -on clonazepam   Osteopenia --T score 2011 normal, T score 02-2013 -1.1 HOH-- aids  rx 2016 CV: --ASD per bubble study 2004 --Cardiomyopathy? Normal 2007 EF 52%, normal EF per echo H/o anemia -- normal iron  03-2015  PLAN HTN: Controlled on losartan and carvedilol. Check a BMP Anxiety, depression, insomnia: On Wellbutrin, Remeron and clonazepam 0.5 mg 2 tablets at bedtime. Rec to decrease clonazepam to 1 or none if possible to decrease her fall risk. Recommend to discuss with psychiatry. Gait disorder: No further falls since the last time, recommend to use a cane consistently, if she has trouble using it ---> discuss with PT. Hip hematoma resolving. RTC 6 months

## 2016-09-19 NOTE — Patient Instructions (Signed)
GO TO THE LAB : Get the blood work     GO TO THE FRONT DESK Schedule your next appointment for a  routine checkup in 5-6 months  Decrease clonazepam to 1 tablet at nighttime, if you can sleep without it would be better.   Use a cane consistently, talk with the physical therapist.

## 2016-09-19 NOTE — Progress Notes (Signed)
Pre visit review using our clinic review tool, if applicable. No additional management support is needed unless otherwise documented below in the visit note. 

## 2016-09-19 NOTE — Assessment & Plan Note (Signed)
HTN: Controlled on losartan and carvedilol. Check a BMP Anxiety, depression, insomnia: On Wellbutrin, Remeron and clonazepam 0.5 mg 2 tablets at bedtime. Rec to decrease clonazepam to 1 or none if possible to decrease her fall risk. Recommend to discuss with psychiatry. Gait disorder: No further falls since the last time, recommend to use a cane consistently, if she has trouble using it ---> discuss with PT. Hip hematoma resolving. RTC 6 months

## 2016-09-28 ENCOUNTER — Encounter: Payer: Self-pay | Admitting: Physical Therapy

## 2016-09-28 ENCOUNTER — Ambulatory Visit: Payer: Medicare Other | Admitting: Physical Therapy

## 2016-09-28 DIAGNOSIS — R2689 Other abnormalities of gait and mobility: Secondary | ICD-10-CM

## 2016-09-28 DIAGNOSIS — M6281 Muscle weakness (generalized): Secondary | ICD-10-CM

## 2016-09-28 NOTE — Therapy (Addendum)
[facility] [address], [place], [place], [address] Phone: [phone]   Fax:  [phone]  Physical Therapy Treatment  Patient Details  Name: [name] [name] MRN: [id number] Date of Birth: [birth year] Referring Provider: Dr. Kathlene November  Encounter Date: 09/28/2016      PT End of Session - 09/28/16 1451    Visit Number 3   Number of Visits 10   Date for PT Re-Evaluation 10/30/16   Authorization Type medicare g-code 10th visit   PT Start Time 1400   PT Stop Time 1445   PT Time Calculation (min) 45 min   Activity Tolerance Patient tolerated treatment well   Behavior During Therapy [facility] for tasks assessed/performed      Past Medical History:  Diagnosis Date  . Anemia   . Anxiety and depression 07/16/2009  . ASD (atrial septal defect)     per Bubble study 2004  . Cardiomyopathy    had normal cath 2007 w/ EF 52%; normal EF per echo 04/2013  . Hx of colonic polyps   . Hypertension   . Osteoarthritis   . Osteopenia     Past Surgical History:  Procedure Laterality Date  . BACK SURGERY  2000   due to fall  . BREAST SURGERY     negative Bx  . TONSILLECTOMY      There were no vitals filed for this visit.      Subjective Assessment - 09/28/16 1410    Subjective No pain, no new complaints.   Diagnostic tests x-rays no fractures   Patient Stated Goals reduce falls   Currently in Pain? No/denies                         [facility] Adult PT Treatment/Exercise - 09/28/16 0001      Ambulation/Gait   Gait Comments instructed patient on how to use the [facility] on level surface.  Patient needed VC to coordinate feet and cane     Neuro Re-ed    Neuro Re-ed Details  educated patient on ways to prevent falls when going to the bathroom and going up and down stairs     Knee/Hip Exercises: Stretches   Active Hamstring Stretch Both;3 reps;10 seconds;1 rep   Piriformis Stretch Both;2 reps;10 seconds     Knee/Hip Exercises: Aerobic   Nustep L1 x 7 min     Knee/Hip Exercises: Standing   Step Down 1 set;15 reps;Hand Hold: 2;Left;Right   Wall Squat 1 set;10 seconds;5 reps   Other Standing Knee Exercises stand and turn 360 degrees 4x each way; stand on one foot and bring other foot diagonally back 5 x each foot; single leg stance 5 sec 5 times each   Other Standing Knee Exercises sit to stand 10x; sit to stand on toes 10x;                 PT Education - 09/28/16 1438    Education provided No          PT Short Term Goals - 09/28/16 1416      PT SHORT TERM GOAL #1   Title independent with initial HEP   Time 4   Period Weeks   Status Achieved     PT SHORT TERM GOAL #2   Title understand the risk of falls   Time 4   Period Weeks   Status Achieved     PT SHORT TERM GOAL #3  Title Berg balance score  has improved >/= 50/56   Time 4   Period Weeks   Status On-going     PT SHORT TERM GOAL #4   Title confidence to go up and down stairs improved >/= 25%   Time 4   Period Weeks   Status Achieved  Patient counts the steps and goes slowly           PT Long Term Goals - 09/04/16 1554      PT LONG TERM GOAL #1   Title independent with HEP   Time 8   Period Weeks   Status New     PT LONG TERM GOAL #2   Title Berg balance score is >/= 52/56 to reduce her chance of falls   Time 8   Period Weeks   Status New     PT LONG TERM GOAL #3   Title TUG score is </= 13 seconds   Time 8   Period Weeks   Status New     PT LONG TERM GOAL #4   Title confidence to go up and down stairs has improved >/= 50%   Time 8   Period Weeks   Status New               Plan - 09/28/16 1438    Clinical Impression Statement Patient is doing her HEP daily.  Patient is 25% more secure on the steps.  Patient has difficulty with single leg stance.  Patient understands how to use the cane but her she is a little unsteady with the placement of the cane.  Patient has met her  STG's.  Patient will benefit from skilled therapy to improve balance and strength of legs to decrease risk of falls.    Rehab Potential Excellent   Clinical Impairments Affecting Rehab Potential risk of falls   PT Frequency 2x / week   PT Duration 8 weeks   PT Treatment/Interventions Moist Heat;Cryotherapy;Stair training;Gait training;Therapeutic activities;Therapeutic exercise;Balance training;Neuromuscular re-education;Patient/family education;Passive range of motion;Manual techniques   PT Next Visit Plan physioball exercises for balance and strengthening   PT Home Exercise Plan progress as needed   Recommended Other Services None   Consulted and Agree with Plan of Care Patient      Patient will benefit from skilled therapeutic intervention in order to improve the following deficits and impairments:  Abnormal gait, Difficulty walking, Decreased strength, Decreased balance, Decreased activity tolerance  Visit Diagnosis: Muscle weakness (generalized)  Other abnormalities of gait and mobility     Problem List Patient Active Problem List   Diagnosis Date Noted  . PCP NOTES >>>>> 09/23/2015  . Left hip pain 09/24/2014  . Left knee pain 09/24/2014  . Anemia 09/14/2014  . Podagra 04/29/2014  . Gait disorder 03/07/2013  . Pre-diabetes 02/10/2013  . Neck sprain 09/02/2012  . Annual physical exam 09/04/2011  . SKIN LESION 08/03/2010  . DJD (degenerative joint disease) 02/09/2010  . PREMATURE VENTRICULAR CONTRACTIONS 07/20/2009  . Anxiety and depression 07/16/2009  . RESTLESS LEG SYNDROME 07/10/2009  . CARDIOMYOPATHY 04/23/2007  . HTN (hypertension) 04/15/2007  . OSTEOPENIA 04/15/2007  . COLONIC POLYPS, HX OF 04/15/2007    Earlie Counts, PT 09/28/16 2:51 PM   [facility] [address], [place], [place], [address] Phone: [phone]   Fax:  [phone]  Name: [name] [name] MRN: [id number] Date of Birth:  Jan 02, 1927  PHYSICAL THERAPY DISCHARGE SUMMARY  Visits from Start of Care: 3  Current functional level related to goals / functional outcomes: See above.   Remaining deficits: See above.   Education / Equipment: HEP Plan:                                                    Patient goals were not met. Patient is being discharged due to not returning since the last visit.  Thank you for the referral. Earlie Counts, PT 01/15/18 2:34 PM  ?????

## 2016-11-28 ENCOUNTER — Other Ambulatory Visit: Payer: Self-pay | Admitting: Internal Medicine

## 2016-12-08 ENCOUNTER — Other Ambulatory Visit: Payer: Self-pay | Admitting: Internal Medicine

## 2016-12-27 LAB — HM MAMMOGRAPHY

## 2017-01-03 ENCOUNTER — Encounter: Payer: Self-pay | Admitting: Internal Medicine

## 2017-02-13 ENCOUNTER — Telehealth: Payer: Self-pay | Admitting: Cardiovascular Disease

## 2017-02-13 NOTE — Telephone Encounter (Signed)
New message      Pt is calling to see if she needs to see Dr Eden Emms for a yearly appt or is she to see him as needed.  Pt cannot remember and there is no recall.  Please call

## 2017-02-13 NOTE — Telephone Encounter (Signed)
Spoke with pt and scheduled her to see Dr. Eden Emms on 03/05/17.  Advised he had wanted to see her 6 mos after last visit.  Pt verbalized understanding and was in agreement with this plan.

## 2017-02-15 ENCOUNTER — Encounter: Payer: Self-pay | Admitting: Cardiovascular Disease

## 2017-03-02 NOTE — Progress Notes (Signed)
Patient ID: [name] [name], female   DOB: 1927/10/05, 83 y.o.   MRN: [id number] [name] [name] is seen back today for a follow up visit.   She has a history of DCM, presumed to be nonischemic with normal cath back in 2007. Other issues include asymptomatic PVCs, restless legs, HTN, anemia, depression and anxiety. Apparently with a remote positive bubble study for ASD.  Last echo reviewed: 02/10/16  EF 40-45% Mild to Moderate TR PA 33 mmHg  Mild MR Grade one diastolic   Did not take her BP meds this am    No edema, PND, orthopnea.  Lives with daughter Still driving.  No chest pain palpitations or syncope  ROS: Denies fever, malais, weight loss, blurry vision, decreased visual acuity, cough, sputum, SOB, hemoptysis, pleuritic pain, palpitaitons, heartburn, abdominal pain, melena, lower extremity edema, claudication, or rash.  All other systems reviewed and negative  General: Affect appropriate Healthy:  appears stated age HEENT: normal Neck supple with no adenopathy JVP normal no bruits no thyromegaly Lungs clear with no wheezing and good diaphragmatic motion Heart:  S1/S2 no murmur, no rub, gallop or click PMI normal Abdomen: benighn, BS positve, no tenderness, no AAA no bruit.  No HSM or HJR Distal pulses intact with no bruits No edema Neuro non-focal Skin warm and dry No muscular weakness   Current Outpatient Prescriptions  Medication Sig Dispense Refill  . aspirin 81 MG tablet Take 81 mg by mouth daily.    Marland Kitchen buPROPion (WELLBUTRIN XL) 150 MG 24 hr tablet Take 450 mg by mouth every morning.     . carvedilol (COREG) 25 MG tablet Take 1 tablet (25 mg total) by mouth 2 (two) times daily with a meal. 60 tablet 5  . latanoprost (XALATAN) 0.005 % ophthalmic solution Place 1 drop into both eyes every evening.    Marland Kitchen losartan (COZAAR) 100 MG tablet Take 1 tablet (100 mg total) by mouth daily. 90 tablet 1  . mirtazapine (REMERON) 30 MG tablet Take 30 mg by mouth at bedtime.     .  Multiple Vitamin (MULTIVITAMIN) tablet Take 1 tablet by mouth every morning.     . polyethylene glycol (MIRALAX / GLYCOLAX) packet Take 17 g by mouth 3 (three) times a week. Mon, wed, and fri     No current facility-administered medications for this visit.     Allergies  Shellfish allergy; Codeine; Hydrocodone; and Adhesive [tape]  Electrocardiogram:  2/15  SR rate 73  Nonspecific ST changes  01/26/16  SR rate 72 normal  03/05/17 SR rate 71 nonspecific ST changes   Assessment and Plan HTN: Well controlled.  Continue current medications and low sodium Dash type diet.   DCM: Functional class one continue ARB/Beta blocker f/u echo ordered   PVC" asymptomatic quiescent continue beta blocker  PFO: not noted on last TTE  No TIA symptoms no need for f/u at this time  Anxiety/Depression continue welburin    Charlton Haws

## 2017-03-05 ENCOUNTER — Encounter: Payer: Self-pay | Admitting: Cardiovascular Disease

## 2017-03-05 ENCOUNTER — Ambulatory Visit (INDEPENDENT_AMBULATORY_CARE_PROVIDER_SITE_OTHER): Payer: Medicare Other | Admitting: Cardiovascular Disease

## 2017-03-05 VITALS — BP 140/80 | HR 64 | Ht 65.0 in | Wt 147.2 lb

## 2017-03-05 DIAGNOSIS — I429 Cardiomyopathy, unspecified: Secondary | ICD-10-CM

## 2017-03-05 DIAGNOSIS — Z79899 Other long term (current) drug therapy: Secondary | ICD-10-CM

## 2017-03-05 NOTE — Patient Instructions (Signed)

## 2017-03-15 ENCOUNTER — Other Ambulatory Visit: Payer: Self-pay

## 2017-03-15 ENCOUNTER — Ambulatory Visit (HOSPITAL_COMMUNITY): Payer: Medicare Other | Attending: Cardiology

## 2017-03-15 DIAGNOSIS — I340 Nonrheumatic mitral (valve) insufficiency: Secondary | ICD-10-CM | POA: Diagnosis not present

## 2017-03-15 DIAGNOSIS — I493 Ventricular premature depolarization: Secondary | ICD-10-CM | POA: Insufficient documentation

## 2017-03-15 DIAGNOSIS — D649 Anemia, unspecified: Secondary | ICD-10-CM | POA: Diagnosis not present

## 2017-03-15 DIAGNOSIS — I272 Pulmonary hypertension, unspecified: Secondary | ICD-10-CM | POA: Diagnosis not present

## 2017-03-15 DIAGNOSIS — I429 Cardiomyopathy, unspecified: Secondary | ICD-10-CM | POA: Insufficient documentation

## 2017-03-15 DIAGNOSIS — I10 Essential (primary) hypertension: Secondary | ICD-10-CM | POA: Diagnosis not present

## 2017-03-15 DIAGNOSIS — I371 Nonrheumatic pulmonary valve insufficiency: Secondary | ICD-10-CM | POA: Insufficient documentation

## 2017-03-15 DIAGNOSIS — I071 Rheumatic tricuspid insufficiency: Secondary | ICD-10-CM | POA: Diagnosis not present

## 2017-03-19 ENCOUNTER — Telehealth: Payer: Self-pay | Admitting: *Deleted

## 2017-03-19 NOTE — Telephone Encounter (Signed)
Called patient and left message to return call to schedule AWV w/ Health Coach. 

## 2017-03-20 ENCOUNTER — Ambulatory Visit (INDEPENDENT_AMBULATORY_CARE_PROVIDER_SITE_OTHER): Payer: Medicare Other | Admitting: Internal Medicine

## 2017-03-20 ENCOUNTER — Encounter: Payer: Self-pay | Admitting: Internal Medicine

## 2017-03-20 VITALS — BP 124/78 | HR 75 | Temp 97.8°F | Resp 14 | Ht 65.0 in | Wt 144.1 lb

## 2017-03-20 DIAGNOSIS — I10 Essential (primary) hypertension: Secondary | ICD-10-CM

## 2017-03-20 DIAGNOSIS — F419 Anxiety disorder, unspecified: Secondary | ICD-10-CM

## 2017-03-20 DIAGNOSIS — F329 Major depressive disorder, single episode, unspecified: Secondary | ICD-10-CM | POA: Diagnosis not present

## 2017-03-20 DIAGNOSIS — Z23 Encounter for immunization: Secondary | ICD-10-CM

## 2017-03-20 DIAGNOSIS — R7303 Prediabetes: Secondary | ICD-10-CM | POA: Diagnosis not present

## 2017-03-20 DIAGNOSIS — Z0000 Encounter for general adult medical examination without abnormal findings: Secondary | ICD-10-CM

## 2017-03-20 LAB — BASIC METABOLIC PANEL
BUN: 27 mg/dL — AB (ref 6–23)
CHLORIDE: 107 meq/L (ref 96–112)
CO2: 28 meq/L (ref 19–32)
Calcium: 10.3 mg/dL (ref 8.4–10.5)
Creatinine, Ser: 1.19 mg/dL (ref 0.40–1.20)
GFR: 55.09 mL/min — ABNORMAL LOW (ref >60.00)
Glucose, Bld: 97 mg/dL (ref 70–99)
POTASSIUM: 3.8 meq/L (ref 3.5–5.1)
Sodium: 140 mEq/L (ref 135–145)

## 2017-03-20 LAB — HEMOGLOBIN A1C: HEMOGLOBIN A1C: 6 % (ref 4.6–6.5)

## 2017-03-20 NOTE — Patient Instructions (Signed)
GO TO THE LAB : Get the blood work     GO TO THE FRONT DESK Schedule your next appointment for a physical exam in 6 months   Fall Prevention in the Home Falls can cause injuries and can affect people from all age groups. There are many simple things that you can do to make your home safe and to help prevent falls. What can I do on the outside of my home?  Regularly repair the edges of walkways and driveways and fix any cracks.  Remove high doorway thresholds.  Trim any shrubbery on the main path into your home.  Use bright outdoor lighting.  Clear walkways of debris and clutter, including tools and rocks.  Regularly check that handrails are securely fastened and in good repair. Both sides of any steps should have handrails.  Install guardrails along the edges of any raised decks or porches.  Have leaves, snow, and ice cleared regularly.  Use sand or salt on walkways during winter months.  In the garage, clean up any spills right away, including grease or oil spills. What can I do in the bathroom?  Use night lights.  Install grab bars by the toilet and in the tub and shower. Do not use towel bars as grab bars.  Use non-skid mats or decals on the floor of the tub or shower.  If you need to sit down while you are in the shower, use a plastic, non-slip stool.  Keep the floor dry. Immediately clean up any water that spills on the floor.  Remove soap buildup in the tub or shower on a regular basis.  Attach bath mats securely with double-sided non-slip rug tape.  Remove throw rugs and other tripping hazards from the floor. What can I do in the bedroom?  Use night lights.  Make sure that a bedside light is easy to reach.  Do not use oversized bedding that drapes onto the floor.  Have a firm chair that has side arms to use for getting dressed.  Remove throw rugs and other tripping hazards from the floor. What can I do in the kitchen?  Clean up any spills right  away.  Avoid walking on wet floors.  Place frequently used items in easy-to-reach places.  If you need to reach for something above you, use a sturdy step stool that has a grab bar.  Keep electrical cables out of the way.  Do not use floor polish or wax that makes floors slippery. If you have to use wax, make sure that it is non-skid floor wax.  Remove throw rugs and other tripping hazards from the floor. What can I do in the stairways?  Do not leave any items on the stairs.  Make sure that there are handrails on both sides of the stairs. Fix handrails that are broken or loose. Make sure that handrails are as long as the stairways.  Check any carpeting to make sure that it is firmly attached to the stairs. Fix any carpet that is loose or worn.  Avoid having throw rugs at the top or bottom of stairways, or secure the rugs with carpet tape to prevent them from moving.  Make sure that you have a light switch at the top of the stairs and the bottom of the stairs. If you do not have them, have them installed. What are some other fall prevention tips?  Wear closed-toe shoes that fit well and support your feet. Wear shoes that have rubber soles or  low heels.  When you use a stepladder, make sure that it is completely opened and that the sides are firmly locked. Have someone hold the ladder while you are using it. Do not climb a closed stepladder.  Add color or contrast paint or tape to grab bars and handrails in your home. Place contrasting color strips on the first and last steps.  Use mobility aids as needed, such as canes, walkers, scooters, and crutches.  Turn on lights if it is dark. Replace any light bulbs that burn out.  Set up furniture so that there are clear paths. Keep the furniture in the same spot.  Fix any uneven floor surfaces.  Choose a carpet design that does not hide the edge of steps of a stairway.  Be aware of any and all pets.  Review your medicines with your  healthcare provider. Some medicines can cause dizziness or changes in blood pressure, which increase your risk of falling. Talk with your health care provider about other ways that you can decrease your risk of falls. This may include working with a physical therapist or trainer to improve your strength, balance, and endurance. This information is not intended to replace advice given to you by your health care provider. Make sure you discuss any questions you have with your health care provider. Document Released: 10/20/2002 Document Revised: 03/28/2016 Document Reviewed: 12/04/2014 Elsevier Interactive Patient Education  2017 [employer].

## 2017-03-20 NOTE — Progress Notes (Signed)
Subjective:    Patient ID: [name] [name], female    DOB: September 20, 1927, 83 y.o.   MRN: [id number]  DOS:  03/20/2017 Type of visit - description : Routine checkup Interval history: In general feeling well, no concerns. She remains active physically and mentally. Went to see cardiology, note reviewed. Medications reviewed: Good compliance without apparent side effects.    Review of Systems   Past Medical History:  Diagnosis Date  . Anemia   . Anxiety and depression 07/16/2009  . ASD (atrial septal defect)     per Bubble study 2004  . Cardiomyopathy    had normal cath 2007 w/ EF 52%; normal EF per echo 04/2013  . Hx of colonic polyps   . Hypertension   . Osteoarthritis   . Osteopenia     Past Surgical History:  Procedure Laterality Date  . BACK SURGERY  2000   due to fall  . BREAST SURGERY     negative Bx  . TONSILLECTOMY      Social History   Social History  . Marital status: Widowed    Spouse name: N/A  . Number of children: 1  . Years of education: N/A   Occupational History  . retired--Motivational Speaker Retired   Social History Main Topics  . Smoking status: Never Smoker  . Smokeless tobacco: Never Used  . Alcohol use No  . Drug use: No  . Sexual activity: Not Currently   Other Topics Concern  . Not on file   Social History Narrative   Widow, moved to [facility] 2007.   Has a daughter Vikki Ports and a G-d in [place]; lost her son-in-law, moved w/ daughter 2014 due to depression          Allergies as of 03/20/2017      Reactions   Shellfish Allergy Anaphylaxis   Codeine    Unknown, per pt   Hydrocodone    Unknown, per pt   Adhesive [tape] Rash      Medication List       Accurate as of 03/20/17 11:59 PM. Always use your most recent med list.          aspirin 81 MG tablet Take 81 mg by mouth daily.   buPROPion 150 MG 24 hr tablet Commonly known as:  WELLBUTRIN XL Take 450 mg by mouth every morning.   carvedilol 25 MG tablet Commonly known  as:  COREG Take 1 tablet (25 mg total) by mouth 2 (two) times daily with a meal.   latanoprost 0.005 % ophthalmic solution Commonly known as:  XALATAN Place 1 drop into both eyes every evening.   losartan 100 MG tablet Commonly known as:  COZAAR Take 1 tablet (100 mg total) by mouth daily.   mirtazapine 30 MG tablet Commonly known as:  REMERON Take 30 mg by mouth at bedtime.   multivitamin tablet Take 1 tablet by mouth every morning.   polyethylene glycol packet Commonly known as:  MIRALAX / GLYCOLAX Take 17 g by mouth 3 (three) times a week. Mon, wed, and fri          Objective:   Physical Exam BP 124/78 (BP Location: Left Arm, Patient Position: Sitting, Cuff Size: Small)   Pulse 75   Temp 97.8 F (36.6 C) (Oral)   Resp 14   Ht 5\' 5"  (1.651 m)   Wt 144 lb 2 oz (65.4 kg)   SpO2 98%   BMI 23.98 kg/m  General:   Well developed, well  nourished . NAD.  HEENT:  Normocephalic . Face symmetric, atraumatic Lungs:  CTA B Normal respiratory effort, no intercostal retractions, no accessory muscle use. Heart: RRR,  no murmur.  No pretibial edema bilaterally  Skin: Not pale. Not jaundice Neurologic:  alert & oriented X3.  Speech normal, gait appropriate for age and unassisted Psych--  Cognition and judgment appear intact.  Cooperative with normal attention span and concentration.  Behavior appropriate. No anxious or depressed appearing.      Assessment & Plan:   Assessment  Prediabetes HTN-- amlodipine d/c 07-2016 (edema) DJD Mild CRI creatinine 1.3, 1.4 Anxiety depression onset 2010 -- Dr Donell Beers Insomnia -on clonazepam   Osteopenia --T score 2011 normal, T score 02-2013 -1.1 HOH-- aids rx 2016 CV: --ASD per bubble study 2004 --Cardiomyopathy? Normal 2007 EF 52%, normal EF per echo H/o anemia -- normal iron  03-2015  PLAN Prediabetes: Excellent lifestyle, check A1c HTN: Good compliance would losartan, BP is great, check a BMP Anxiety, depression,  insomnia: well-controlled Primary care: A visiting nurse for her insurance recommended to think about a Td and Shingrex, pros and cause discuss specifically the chance for side effects from the new shingles vaccination. Elected Td only. RTC 6 months, CPX. Apparently she already had a Medicare wellness 03/13/2017

## 2017-03-20 NOTE — Progress Notes (Signed)
Pre visit review using our clinic review tool, if applicable. No additional management support is needed unless otherwise documented below in the visit note. 

## 2017-03-21 NOTE — Assessment & Plan Note (Signed)
Prediabetes: Excellent lifestyle, check A1c HTN: Good compliance would losartan, BP is great, check a BMP Anxiety, depression, insomnia: well-controlled Primary care: A visiting nurse for her insurance recommended to think about a Td and Shingrex, pros and cause discuss specifically the chance for side effects from the new shingles vaccination. Elected Td only. RTC 6 months, CPX. Apparently she already had a Medicare wellness 03/13/2017

## 2017-03-21 NOTE — Assessment & Plan Note (Signed)
A visiting nurse for her insurance recommended to think about a Td and Shingrex, pros and cause discuss specifically the chance for side effects from the new shingles vaccination. Elected Td only.

## 2017-03-28 ENCOUNTER — Encounter: Payer: Self-pay | Admitting: Cardiovascular Disease

## 2017-04-07 ENCOUNTER — Other Ambulatory Visit: Payer: Self-pay | Admitting: Internal Medicine

## 2017-04-26 ENCOUNTER — Encounter: Payer: Self-pay | Admitting: Cardiovascular Disease

## 2017-04-26 ENCOUNTER — Encounter: Payer: Self-pay | Admitting: Internal Medicine

## 2017-07-02 ENCOUNTER — Other Ambulatory Visit: Payer: Self-pay | Admitting: Internal Medicine

## 2017-08-07 ENCOUNTER — Encounter: Payer: Self-pay | Admitting: Internal Medicine

## 2017-08-13 ENCOUNTER — Ambulatory Visit: Payer: Self-pay

## 2017-08-13 ENCOUNTER — Ambulatory Visit (INDEPENDENT_AMBULATORY_CARE_PROVIDER_SITE_OTHER): Payer: Medicare Other | Admitting: Sports Medicine

## 2017-08-13 ENCOUNTER — Encounter: Payer: Self-pay | Admitting: Sports Medicine

## 2017-08-13 VITALS — BP 158/82 | HR 72 | Ht 65.0 in | Wt 148.6 lb

## 2017-08-13 DIAGNOSIS — M25562 Pain in left knee: Secondary | ICD-10-CM

## 2017-08-13 DIAGNOSIS — M109 Gout, unspecified: Secondary | ICD-10-CM

## 2017-08-13 NOTE — Procedures (Signed)
PROCEDURE NOTE - ULTRASOUND GUIDED INJECTION: Left knee Images were obtained and interpreted by myself, Gaspar Bidding, DO  Images have been saved and stored to PACS system. Images obtained on: GE S7 Ultrasound machine  ULTRASOUND FINDINGS:  Generalized synovitis with small effusion.  Patellar tendon is intact.  Lateral joint line had significant swelling and bulging of the meniscus with marked increased neovascularity consistent with an acute on chronic injury.  She does have a moderate degree of medial and lateral joint line bossing with osteophytic spurring.  DESCRIPTION OF PROCEDURE:  The patient's clinical condition is marked by substantial pain and/or significant functional disability. Other conservative therapy has not provided relief, is contraindicated, or not appropriate. There is a reasonable likelihood that injection will significantly improve the patient's pain and/or functional impairment. After discussing the risks, benefits and expected outcomes of the injection and all questions were reviewed and answered, the patient wished to undergo the above named procedure. Verbal consent was obtained. The ultrasound was used to identify the target structure and adjacent neurovascular structures. The skin was then prepped in sterile fashion and the target structure was injected under direct visualization using sterile technique as below: PREP: Alcohol, Ethel Chloride APPROACH: Superiolateral, single injection, 25g 1.5" needle INJECTATE: 2cc 1% lidocaine, 2 cc 40mg  DepoMedrol ASPIRATE: N/A DRESSING: Band-Aid  Post procedural instructions including recommending icing and warning signs for infection were reviewed. This procedure was well tolerated and there were no complications.   IMPRESSION: Succesful US Guided Injection

## 2017-08-13 NOTE — Assessment & Plan Note (Signed)
Symptoms are consistent with crystal arthropathy however minimal effusion but marked synovitis.  Could consider repeat uric acid check remarkably normal in the past.

## 2017-08-13 NOTE — Assessment & Plan Note (Signed)
Symptoms are consistent with a acute on chronic lateral meniscal tear.  Injection performed today.  She is surprisingly active and mobile and I would recommend continuing with the exercises and strengthening that she is doing.  I would like for her to avoid activities as of today and over the next 24-48 hours but okay to increase as tolerated after this.  If any lack of improvement can consider repeat injection versus consideration of Visco supplementation.

## 2017-08-13 NOTE — Patient Instructions (Signed)

## 2017-08-13 NOTE — Progress Notes (Signed)
OFFICE VISIT NOTE [name] [name]. [name] [place] at [facility] [phone]  [name] [name] - 83 y.o. female MRN [id number]  Date of birth: 09/30/1927  Visit Date: 08/13/2017  PCP: [name] Plump, MD   Referred by: [name] Plump, MD  [name] [name] PT, LAT, ATC acting as scribe for Dr. Berline Chough.  SUBJECTIVE:   Chief Complaint  Patient presents with  . New Patient (Initial Visit)    L knee pain and swelling   HPI: As below and per problem based documentation when appropriate.  [name] [name] is a new patient presenting today for evaluation of LT knee pain and swelling.  Pt reports that her L knee began to bother her about 2 weeks w/ no specific MOI.  She notes that the pain began w/ a soreness in her L knee and that it has gotten progressively worse.  She reports the L knee pain as a nagging pain.  She states that she has been taking 4 Tylenol a day and has tried heat but has not noticed any improvement.  Pt reports pain w/ walking.  She notes the most issue w/ weight bearing and reports difficulties w/ sit-to-stand transfers.  She notes that her L knee also bothers her in her sleep.  She also states that she feels like her L knee is swollen.      Review of Systems  Constitutional: Negative for chills, fever and weight loss.  HENT: Negative.   Eyes: Negative.   Respiratory: Negative for cough, shortness of breath and wheezing.   Cardiovascular: Negative for chest pain and palpitations.  Gastrointestinal: Negative for abdominal pain, heartburn, nausea and vomiting.  Genitourinary: Negative.   Musculoskeletal: Positive for joint pain. Negative for falls.  Neurological: Negative for dizziness, tingling and headaches.  Endo/Heme/Allergies: Bruises/bleeds easily.  Psychiatric/Behavioral: Negative for depression. The patient is not nervous/anxious and does not have insomnia.     Otherwise per HPI.  HISTORY & PERTINENT PRIOR DATA:   She  reports that she has never smoked. She has never used smokeless tobacco.   Recent Labs  03/20/17 1454  HGBA1C 6.0   Allergies reviewed per EMR Prior to Admission medications   Medication Sig Start Date End Date Taking? Authorizing Provider  aspirin 81 MG tablet Take 81 mg by mouth daily.   Yes [provider]  buPROPion (WELLBUTRIN XL) 150 MG 24 hr tablet Take 450 mg by mouth every morning.    Yes [provider]  carvedilol (COREG) 25 MG tablet Take 1 tablet (25 mg total) by mouth 2 (two) times daily with a meal. 04/10/17  Yes Paz, Jose E, MD  latanoprost (XALATAN) 0.005 % ophthalmic solution Place 1 drop into both eyes every evening. 02/29/16  Yes [provider]  losartan (COZAAR) 100 MG tablet Take 1 tablet (100 mg total) by mouth daily. 07/02/17  Yes Paz, Nolon Rod, MD  mirtazapine (REMERON) 30 MG tablet Take 30 mg by mouth at bedtime.  03/04/13  Yes Paz, Nolon Rod, MD  Multiple Vitamin (MULTIVITAMIN) tablet Take 1 tablet by mouth every morning.    Yes [provider]  polyethylene glycol (MIRALAX / GLYCOLAX) packet Take 17 g by mouth 3 (three) times a week. Mon, wed, and fri   Yes [provider]   Patient Active Problem List   Diagnosis Date Noted  . PCP NOTES >>>>> 09/23/2015  . Left hip pain 09/24/2014  . Left knee pain 09/24/2014  .  Anemia 09/14/2014  . Podagra 04/29/2014  . Gait disorder 03/07/2013  . Pre-diabetes 02/10/2013  . Neck sprain 09/02/2012  . Annual physical exam 09/04/2011  . SKIN LESION 08/03/2010  . DJD (degenerative joint disease) 02/09/2010  . PREMATURE VENTRICULAR CONTRACTIONS 07/20/2009  . Anxiety and depression 07/16/2009  . RESTLESS LEG SYNDROME 07/10/2009  . CARDIOMYOPATHY 04/23/2007  . HTN (hypertension) 04/15/2007  . OSTEOPENIA 04/15/2007  . COLONIC POLYPS, HX OF 04/15/2007   Past Medical History:  Diagnosis Date  . Anemia   . Anxiety and depression 07/16/2009  . ASD (atrial septal defect)     per Bubble  study 2004  . Cardiomyopathy    had normal cath 2007 w/ EF 52%; normal EF per echo 04/2013  . Hx of colonic polyps   . Hypertension   . Osteoarthritis   . Osteopenia    Family History  Problem Relation Age of Onset  . Heart attack Father        F and 2 brothers (no early onset)  . Heart disease Mother   . Hypertension Other   . Diabetes Neg Hx   . Cancer Neg Hx        colon or breast   Past Surgical History:  Procedure Laterality Date  . BACK SURGERY  2000   due to fall  . BREAST SURGERY     negative Bx  . TONSILLECTOMY     Social History   Occupational History  . retired--Motivational Speaker Retired   Social History Main Topics  . Smoking status: Never Smoker  . Smokeless tobacco: Never Used  . Alcohol use No  . Drug use: No  . Sexual activity: Not Currently    OBJECTIVE:  VS:  HT:5\' 5"  (165.1 cm)   WT:148 lb 9.6 oz (67.4 kg)  BMI:24.73    BP:(!) 158/82  HR:72bpm  TEMP: ( )  RESP:100 % EXAM: Findings:  WDWN, NAD, Non-toxic appearing Alert & appropriately interactive Not depressed or anxious appearing No increased work of breathing. Pupils are equal. EOM intact without nystagmus No clubbing or cyanosis of the extremities appreciated No significant rashes/lesions/ulcerations overlying the examined area. DP & PT pulses 2+/4.  No significant pretibial edema.  No clubbing or cyanosis Sensation intact to light touch in lower extremities.  Right Knee: Overall joint is well aligned, no significant deformity.   Generalized synovitis without significant effusion.   ROM: 0 to 120.   Extensor mechanism intact Generalized medial and lateral joint line pain without focal tenderness..   Stable to varus and valgus strain with pain with both of these maneuvers.  Stable to anterior and posterior drawer, pain-free   No focal mechanical click with McMurray's.  Small amount of pain.      RADIOLOGY: n/a ASSESSMENT & PLAN:     ICD-10-CM   1. Left knee pain,  unspecified chronicity M25.562 [place] LIMITED JOINT SPACE STRUCTURES LOW LEFT(NO LINKED CHARGES)  2. Podagra M10.9    ================================================================= Left knee pain Symptoms are consistent with a acute on chronic lateral meniscal tear.  Injection performed today.  She is surprisingly active and mobile and I would recommend continuing with the exercises and strengthening that she is doing.  I would like for her to avoid activities as of today and over the next 24-48 hours but okay to increase as tolerated after this.  If any lack of improvement can consider repeat injection versus consideration of Visco supplementation.  Podagra Symptoms are consistent with crystal arthropathy however minimal effusion but marked  synovitis.  Could consider repeat uric acid check remarkably normal in the past.  PROCEDURE NOTE - ULTRASOUND GUIDED INJECTION: Left knee Images were obtained and interpreted by myself, Gaspar Bidding, DO  Images have been saved and stored to PACS system. Images obtained on: GE S7 Ultrasound machine  ULTRASOUND FINDINGS:  Generalized synovitis with small effusion.  Patellar tendon is intact.  Lateral joint line had significant swelling and bulging of the meniscus with marked increased neovascularity consistent with an acute on chronic injury.  She does have a moderate degree of medial and lateral joint line bossing with osteophytic spurring.  DESCRIPTION OF PROCEDURE:  The patient's clinical condition is marked by substantial pain and/or significant functional disability. Other conservative therapy has not provided relief, is contraindicated, or not appropriate. There is a reasonable likelihood that injection will significantly improve the patient's pain and/or functional impairment. After discussing the risks, benefits and expected outcomes of the injection and all questions were reviewed and answered, the patient wished to undergo the above named procedure.  Verbal consent was obtained. The ultrasound was used to identify the target structure and adjacent neurovascular structures. The skin was then prepped in sterile fashion and the target structure was injected under direct visualization using sterile technique as below: PREP: Alcohol, Ethel Chloride APPROACH: Superiolateral, single injection, 25g 1.5" needle INJECTATE: 2cc 1% lidocaine, 2 cc  DepoMedrol ASPIRATE: N/A DRESSING: Band-Aid  Post procedural instructions including recommending icing and warning signs for infection were reviewed. This procedure was well tolerated and there were no complications.   IMPRESSION: Succesful US Guided Injection   =================================================================  Follow-up: Return in about 8 weeks (around 10/08/2017).   CMA/ATC served as Neurosurgeon during this visit. History, Physical, and Plan performed by medical provider. Documentation and orders reviewed and attested to.      Gaspar Bidding, DO    [facility] Physician

## 2017-09-24 ENCOUNTER — Other Ambulatory Visit: Payer: Self-pay

## 2017-09-24 MED ORDER — LOSARTAN POTASSIUM 100 MG PO TABS: 100.0000 mg | ORAL_TABLET | Freq: Every day | ORAL | 0 refills | Status: DC

## 2017-09-25 ENCOUNTER — Encounter: Payer: Medicare Other | Admitting: Internal Medicine

## 2017-10-02 ENCOUNTER — Encounter: Payer: Self-pay | Admitting: Internal Medicine

## 2017-10-02 ENCOUNTER — Ambulatory Visit (INDEPENDENT_AMBULATORY_CARE_PROVIDER_SITE_OTHER): Payer: Medicare Other | Admitting: Internal Medicine

## 2017-10-02 VITALS — BP 128/78 | HR 70 | Temp 98.0°F | Resp 14 | Ht 65.0 in | Wt 147.2 lb

## 2017-10-02 DIAGNOSIS — Z0000 Encounter for general adult medical examination without abnormal findings: Secondary | ICD-10-CM

## 2017-10-02 DIAGNOSIS — Z23 Encounter for immunization: Secondary | ICD-10-CM | POA: Diagnosis not present

## 2017-10-02 DIAGNOSIS — I10 Essential (primary) hypertension: Secondary | ICD-10-CM

## 2017-10-02 LAB — CBC WITH DIFFERENTIAL/PLATELET
Basophils Absolute: 0.1 10*3/uL (ref 0.0–0.1)
Basophils Relative: 1.3 % (ref 0.0–3.0)
EOS ABS: 0.1 10*3/uL (ref 0.0–0.7)
Eosinophils Relative: 3.1 % (ref 0.0–5.0)
HCT: 39.5 % (ref 36.0–46.0)
HEMOGLOBIN: 13.1 g/dL (ref 12.0–15.0)
Lymphocytes Relative: 31.4 % (ref 12.0–46.0)
Lymphs Abs: 1.4 10*3/uL (ref 0.7–4.0)
MCHC: 33.3 g/dL (ref 30.0–36.0)
MCV: 100.8 fl — ABNORMAL HIGH (ref 78.0–100.0)
MONO ABS: 0.5 10*3/uL (ref 0.1–1.0)
Monocytes Relative: 10.7 % (ref 3.0–12.0)
Neutro Abs: 2.5 10*3/uL (ref 1.4–7.7)
Neutrophils Relative %: 53.5 % (ref 43.0–77.0)
Platelets: 202 10*3/uL (ref 150.0–400.0)
RBC: 3.92 Mil/uL (ref 3.87–5.11)
RDW: 14.9 % (ref 11.5–15.5)
WBC: 4.6 10*3/uL (ref 4.0–10.5)

## 2017-10-02 LAB — LIPID PANEL
Cholesterol: 219 mg/dL — ABNORMAL HIGH (ref 0–200)
HDL: 61.2 mg/dL
LDL Cholesterol: 142 mg/dL — ABNORMAL HIGH (ref 0–99)
NonHDL: 157.58
Total CHOL/HDL Ratio: 4
Triglycerides: 78 mg/dL (ref 0.0–149.0)
VLDL: 15.6 mg/dL (ref 0.0–40.0)

## 2017-10-02 LAB — COMPREHENSIVE METABOLIC PANEL
ALT: 11 U/L (ref 0–35)
AST: 19 U/L (ref 0–37)
Albumin: 4.4 g/dL (ref 3.5–5.2)
Alkaline Phosphatase: 40 U/L (ref 39–117)
BUN: 27 mg/dL — AB (ref 6–23)
CHLORIDE: 106 meq/L (ref 96–112)
CO2: 28 mEq/L (ref 19–32)
Calcium: 10.3 mg/dL (ref 8.4–10.5)
Creatinine, Ser: 1.27 mg/dL — ABNORMAL HIGH (ref 0.40–1.20)
GFR: 51.04 mL/min — ABNORMAL LOW (ref >60.00)
GLUCOSE: 93 mg/dL (ref 70–99)
POTASSIUM: 4.3 meq/L (ref 3.5–5.1)
SODIUM: 141 meq/L (ref 135–145)
Total Bilirubin: 0.4 mg/dL (ref 0.2–1.2)
Total Protein: 7.1 g/dL (ref 6.0–8.3)

## 2017-10-02 LAB — TSH: TSH: 2.46 u[IU]/mL (ref 0.35–4.50)

## 2017-10-02 NOTE — Progress Notes (Signed)
Subjective:    Patient ID: [name] [name], female    DOB: October 05, 1927, 83 y.o.   MRN: [id number]  DOS:  10/02/2017 Type of visit - description : cpx Interval history: In general feeling well, she remains active.   Review of Systems Had knee pain, got a local injection few weeks ago, it helped temporarily but pain is coming back.  Other than above, a 14 point review of systems is negative     Past Medical History:  Diagnosis Date  . Anemia   . Anxiety and depression 07/16/2009  . ASD (atrial septal defect)     per Bubble study 2004  . Cardiomyopathy    had normal cath 2007 w/ EF 52%; normal EF per echo 04/2013  . Hx of colonic polyps   . Hypertension   . Osteoarthritis   . Osteopenia     Past Surgical History:  Procedure Laterality Date  . BACK SURGERY  2000   due to fall  . BREAST SURGERY     negative Bx  . TONSILLECTOMY      Social History   Socioeconomic History  . Marital status: Widowed    Spouse name: Not on file  . Number of children: 1  . Years of education: Not on file  . Highest education level: Not on file  Social Needs  . Financial resource strain: Not on file  . Food insecurity - worry: Not on file  . Food insecurity - inability: Not on file  . Transportation needs - medical: Not on file  . Transportation needs - non-medical: Not on file  Occupational History  . Occupation: retired--Motivational Personnel officer: RETIRED  Tobacco Use  . Smoking status: Never Smoker  . Smokeless tobacco: Never Used  Substance and Sexual Activity  . Alcohol use: No    Alcohol/week: 0.0 oz  . Drug use: No  . Sexual activity: Not Currently  Other Topics Concern  . Not on file  Social History Narrative   Widow, moved to [facility] 2007.   Has a daughter Vikki Ports and a G-d in [place]; lost her son-in-law, moved w/ daughter 2014 due to depression         Family History  Problem Relation Age of Onset  . Heart attack Father        F and 2 brothers (no early  onset)  . Heart disease Mother   . Hypertension Other   . Diabetes Neg Hx   . Cancer Neg Hx        colon or breast     Allergies as of 10/02/2017      Reactions   Shellfish Allergy Anaphylaxis   Codeine    Unknown, per pt   Hydrocodone    Unknown, per pt   Adhesive [tape] Rash      Medication List        Accurate as of 10/02/17  5:43 PM. Always use your most recent med list.          aspirin 81 MG tablet Take 81 mg by mouth daily.   buPROPion 150 MG 24 hr tablet Commonly known as:  WELLBUTRIN XL Take 450 mg by mouth every morning.   carvedilol 25 MG tablet Commonly known as:  COREG Take 1 tablet (25 mg total) by mouth 2 (two) times daily with a meal.   latanoprost 0.005 % ophthalmic solution Commonly known as:  XALATAN Place 1 drop into both eyes every evening.   losartan 100 MG  tablet Commonly known as:  COZAAR Take 1 tablet (100 mg total) daily by mouth.   mirtazapine 30 MG tablet Commonly known as:  REMERON Take 30 mg by mouth at bedtime.   multivitamin tablet Take 1 tablet by mouth every morning.   polyethylene glycol packet Commonly known as:  MIRALAX / GLYCOLAX Take 17 g by mouth 3 (three) times a week. Mon, wed, and fri          Objective:   Physical Exam BP 128/78 (BP Location: Left Arm, Patient Position: Sitting, Cuff Size: Small)   Pulse 70   Temp 98 F (36.7 C) (Oral)   Resp 14   Ht 5\' 5"  (1.651 m)   Wt 147 lb 4 oz (66.8 kg)   BMI 24.50 kg/m  General:   Well developed, well nourished . NAD.  Neck: No  thyromegaly  HEENT:  Normocephalic . Face symmetric, atraumatic Lungs:  CTA B Normal respiratory effort, no intercostal retractions, no accessory muscle use. Heart: Regular?,  no murmur.  No pretibial edema bilaterally  Abdomen:  Not distended, soft, non-tender. No rebound or rigidity.   Skin: Exposed areas without rash. Not pale. Not jaundice Neurologic:  alert & oriented X3.  Speech normal, gait appropriate for age and  unassisted Strength symmetric and appropriate for age.  Psych: Cognition and judgment appear intact.  Cooperative with normal attention span and concentration.  Behavior appropriate. No anxious or depressed appearing.     Assessment & Plan:   Assessment  Prediabetes HTN-- amlodipine d/c 07-2016 (edema) DJD Mild CRI creatinine 1.3, 1.4 Anxiety depression onset 2010 -- Dr Donell BeersPlovsky Insomnia -on clonazepam   Osteopenia --T score 2011 normal, T score 02-2013 -1.1 HOH-- aids rx 2016 CV: --ASD?  Per bubble study 2004, --Dilated cardiomyopathy (DCM).  Likely nonischemic: normal cath 2007, echo 2014 EF 55%, last echo 03/2017: EF 35% H/o anemia -- normal iron  03-2015  PLAN Pre diabetes: Last A1c satisfactory HTN: BP today is very good, ambulatory BPs when checked normal.  Continue carvedilol, losartan.  Checking labs and EKG. Cardiomyopathy: Pulse felt  somewhat irregular but EKG showed normal sinus rhythm, no acute changes Anxiety, depression, insomnia: Well-controlled, per Dr. Jasmine AwePlovski  RTC 6 months  (although is thinking about transfer to [facility])

## 2017-10-02 NOTE — Progress Notes (Signed)
Pre visit review using our clinic review tool, if applicable. No additional management support is needed unless otherwise documented below in the visit note. 

## 2017-10-02 NOTE — Assessment & Plan Note (Signed)
-  Td 03/2017; shingles shot 2009, pneumonia shot 1-08 and 09/2017;prevnar 2015 Had a flu shot  -No further breast cancer screening , see previous entries -No  further Paps, see previous entries  -CCS: s/p Cscope Dr Randa Evens 2006, and 10- 2011, no further scopes  -Diet and exercise discussed.  She is doing well, remains active, still driving. -Ao [place] 9509 neg for AAA - Labs: CMP, FLP, CBC, TSH

## 2017-10-02 NOTE — Patient Instructions (Signed)
GO TO THE LAB : Get the blood work     GO TO THE FRONT DESK Schedule your next appointment for a routine visit in 6 months 

## 2017-10-02 NOTE — Assessment & Plan Note (Signed)
Pre diabetes: Last A1c satisfactory HTN: BP today is very good, ambulatory BPs when checked normal.  Continue carvedilol, losartan.  Checking labs and EKG. Cardiomyopathy: Pulse felt  somewhat irregular but EKG showed normal sinus rhythm, no acute changes Anxiety, depression, insomnia: Well-controlled, per Dr. Jasmine Awe  RTC 6 months  (although is thinking about transfer to [facility])

## 2017-10-08 ENCOUNTER — Encounter: Payer: Self-pay | Admitting: Internal Medicine

## 2017-10-08 ENCOUNTER — Ambulatory Visit: Payer: Medicare Other | Admitting: Sports Medicine

## 2017-10-08 ENCOUNTER — Encounter: Payer: Self-pay | Admitting: Sports Medicine

## 2017-10-08 ENCOUNTER — Telehealth: Payer: Self-pay | Admitting: Internal Medicine

## 2017-10-08 VITALS — BP 118/84 | HR 73 | Ht 65.0 in | Wt 147.8 lb

## 2017-10-08 DIAGNOSIS — M25562 Pain in left knee: Secondary | ICD-10-CM | POA: Diagnosis not present

## 2017-10-08 NOTE — Progress Notes (Signed)
Michael D. Delorise Shinerigby, DO  [place] [facility] [name]  [name] [name] - 83 y.o. female MRN [id number]  Date of birth: 1926/12/18   Scribe for today's visit: Stevenson ClinchBrandy Cumi Sanagustin, CMA    SUBJECTIVE:  [name] HutchingVirginia M Champine is here for evaluation of Follow-up (LT knee pain)  Initial history: Pt reports that her L knee began to bother her about 2 weeks w/ no specific MOI.  She notes that the pain began w/ a soreness in her L knee and that it has gotten progressively worse.  She reports the L knee pain as a nagging pain.  She states that she has been taking 4 Tylenol a day and has tried heat but has not noticed any improvement.  Pt reports pain w/ walking.  She notes the most issue w/ weight bearing and reports difficulties w/ sit-to-stand transfers.  She notes that her L knee also bothers her in her sleep.  She also states that she feels like her L knee is swollen.    Compared to the last office visit, her previously described symptoms show some improvement - sx were better x about 3 weeks but then started to get stiff again but then over the past week it has improved slightly.  Current symptoms are mild & are nonradiating She has been using heat with some relief. She received steroid injection 08/13/17.    ROS Denies night time disturbances. Denies fevers, chills, or night sweats. Denies unexplained weight loss. Denies personal history of cancer. Denies changes in bowel or bladder habits. Denies recent unreported falls. Denies new or worsening dyspnea or wheezing. Denies headaches or dizziness.  Denies numbness, tingling or weakness  In the extremities.  Denies dizziness or presyncopal episodes Denies lower extremity edema    HISTORY & PERTINENT PRIOR DATA:  Prior History reviewed and updated per electronic medical record. Significant history, findings, studies and interim changes include: No additional findings.  reports that  has never  smoked. she has never used smokeless tobacco. Recent Labs    03/20/17 1454  HGBA1C 6.0   Problem  Left Knee Pain   08/13/2017: Left knee corticosteroid injection      OBJECTIVE:  VS:  HT:5\' 5"  (165.1 cm)   WT:147 lb 12.8 oz (67 kg)  BMI:24.6    BP:118/84  HR:73bpm  TEMP: ( )  RESP:96 %  PHYSICAL EXAM: Constitutional: WDWN, Non-toxic appearing. Psychiatric: Alert & appropriately interactive.Not depressed or anxious appearing. Respiratory: No increased work of breathing. Trachea Midline Eyes: Pupils are equal. EOM intact without nystagmus. No scleral icterus  Bilateral lower extremity NEUROVASCULAR exam: Generalized lower extremity varicosities without ulceration or significant venous stasis swelling. No clubbing or cyanosis appreciated No significant pretibial/lower extremity peripheral edema Capillary Refill: normal, less than 2 seconds   Pedal Pulses: symmetrically palpable  Sensation in examined extremities: Intact to light touch in all dermatomes  Left Knee:  Overall joint is well aligned, no significant deformity.    No significant effusion.    ROM: 0 to 120.    Extensor mechanism intact  No significant medial or lateral joint line tenderness.    Stable to varus/valgus strain & anterior/posterior drawer.    Negative McMurray's.    ASSESSMENT & PLAN:   1. Left knee pain, unspecified chronicity    Plan:  Patient is interested in changing primary care providers to Dr. Earlene PlaterWallace [place] and [place] close proximity to our location.  Left knee pain Overall she is doing great.  Essentially symptomatic at this time.  No mechanical symptoms.  Continue working on remaining active and avoiding exacerbating activities.   ++++++++++++++++++++++++++++++++++++++++++++ Orders:  No orders of the defined types were placed in this encounter.   Meds:  No orders of the defined types were placed in this encounter.    ++++++++++++++++++++++++++++++++++++++++++++ Follow-up: Return if symptoms worsen or fail to improve.   Pertinent documentation may be included in additional procedure notes, imaging studies, problem based documentation and patient instructions. Please see these sections of the encounter for additional information regarding this visit. CMA/ATC served as Neurosurgeon during this visit. History, Physical, and Plan performed by medical provider. Documentation and orders reviewed and attested to.      [facility] Sports Medicine Physician    10/08/2017 10:36 AM

## 2017-10-08 NOTE — Telephone Encounter (Signed)
Okay 

## 2017-10-08 NOTE — Telephone Encounter (Signed)
That is ok, thx  

## 2017-10-08 NOTE — Telephone Encounter (Signed)
Patient would like to transfer care to Dr. Helane Rima due to the location. Please respond with an approval or a denial. Out of courtesy for both providers I will await approval before scheduling.

## 2017-10-08 NOTE — Telephone Encounter (Signed)
Hailey, please call the patient to get them scheduled for a transfer appointment with Dr. Earlene Plater.

## 2017-10-08 NOTE — Assessment & Plan Note (Signed)
Overall she is doing great.  Essentially symptomatic at this time.  No mechanical symptoms.  Continue working on remaining active and avoiding exacerbating activities.

## 2017-10-08 NOTE — Patient Instructions (Signed)
If you have any flareups or worsening of your pain please call let us know so that we can get you back again.  Otherwise continue working on staying active and avoiding twisting with your knee.

## 2017-10-12 ENCOUNTER — Encounter: Payer: Self-pay | Admitting: Internal Medicine

## 2017-10-18 ENCOUNTER — Telehealth: Payer: Self-pay

## 2017-10-18 NOTE — Telephone Encounter (Signed)
Her cholesterol should be rechecked in 4-5 months from now

## 2017-10-18 NOTE — Telephone Encounter (Signed)
Please advise 

## 2017-10-18 NOTE — Telephone Encounter (Signed)
Copied from CRM [id number]. Topic: General - Other >> Oct 18, 2017  3:52 PM Debroah Loop wrote: Reason for CRM: Patient switching from Dr. Drue Novel to Dr. Earlene Plater and wants to know how soon she should see her RE her high cholesterol results post CPE with Paz on 10/02/2017? Please advise.

## 2017-10-19 NOTE — Telephone Encounter (Signed)
Spoke w/ Pt, informed of recommendations. Pt verbalized understanding.  

## 2017-11-22 ENCOUNTER — Telehealth: Payer: Self-pay

## 2017-11-22 NOTE — Telephone Encounter (Signed)
Okay transfer.  

## 2017-11-22 NOTE — Telephone Encounter (Signed)
Copied from CRM [id number]. Topic: Appointment Scheduling - Scheduling Inquiry for Clinic >> Nov 22, 2017  4:43 PM Terisa Starr wrote: Patient is requesting to transfer her care from Dr Drue Novel at [facility] to Dr Earlene Plater at [facility]. She states she told DR Drue Novel this at her last appt.   If approved, please call and sch with patient [phone]

## 2017-11-23 NOTE — Telephone Encounter (Signed)
That is okay, thank you 

## 2017-11-26 NOTE — Telephone Encounter (Signed)
Please contact patient to schedule transfer appointment with Dr. Helane Rima, the transfer was approved.

## 2017-11-26 NOTE — Telephone Encounter (Signed)
Called the patient to schedule appointment. The call went to voicemail and I left a detailed message so that we can  Establish her care with [place].

## 2017-11-27 NOTE — Telephone Encounter (Signed)
NP w/ Dr. Earlene Plater scheduled.

## 2017-12-28 ENCOUNTER — Encounter: Payer: Self-pay | Admitting: Family Medicine

## 2017-12-28 ENCOUNTER — Ambulatory Visit: Payer: Medicare Other | Admitting: Family Medicine

## 2017-12-28 VITALS — BP 140/88 | HR 80 | Temp 97.9°F | Wt 148.4 lb

## 2017-12-28 DIAGNOSIS — E7800 Pure hypercholesterolemia, unspecified: Secondary | ICD-10-CM

## 2017-12-28 MED ORDER — CARVEDILOL 25 MG PO TABS: 25.0000 mg | ORAL_TABLET | Freq: Two times a day (BID) | ORAL | 2 refills | Status: DC

## 2017-12-28 MED ORDER — LOSARTAN POTASSIUM 100 MG PO TABS: 100.0000 mg | ORAL_TABLET | Freq: Every day | ORAL | 2 refills | Status: DC

## 2017-12-30 ENCOUNTER — Encounter: Payer: Self-pay | Admitting: Family Medicine

## 2017-12-30 NOTE — Progress Notes (Signed)
[name] [name] is a 84 y.o. female is here to [facility].   Patient Care Team: Helane Rima, DO as PCP - General (Family Medicine) Archer Asa, MD as Consulting Physician (Psychiatry) Wendall Stade, MD as Consulting Physician (Cardiology)   History of Present Illness:   HPI: See Assessment and Plan section for Problem Based Charting of issues discussed today.  There are no preventive care reminders to display for this patient. Depression screen PHQ 2/9 03/20/2017  Decreased Interest 0  Down, Depressed, Hopeless 0  PHQ - 2 Score 0  Altered sleeping -  Tired, decreased energy -  Change in appetite -  Feeling bad or failure about yourself  -  Trouble concentrating -  Moving slowly or fidgety/restless -  Suicidal thoughts -  PHQ-9 Score -   PMHx, SurgHx, SocialHx, Medications, and Allergies were reviewed in the Visit Navigator and updated as appropriate.   Past Medical History:  Diagnosis Date  . Anemia   . Anxiety and depression 07/16/2009  . ASD (atrial septal defect) per bubble study, 2004   . Cardiomyopathy, normal cath 2007, normal EF per ECHO 2014   . Hx of colonic polyps   . Hypertension   . Osteoarthritis   . Osteopenia    Past Surgical History:  Procedure Laterality Date  . BACK SURGERY  2000  . BREAST SURGERY     NEGATIVE BIOPSY  . TONSILLECTOMY     Family History  Problem Relation Age of Onset  . Heart attack Father   . Heart disease Mother   . Hypertension Other   . Diabetes Neg Hx   . Colon cancer Neg Hx    Social History   Tobacco Use  . Smoking status: Never Smoker  . Smokeless tobacco: Never Used  Substance Use Topics  . Alcohol use: No    Alcohol/week: 0.0 oz  . Drug use: No   Current Medications and Allergies:   .  aspirin 81 MG tablet, Take 81 mg by mouth daily., Disp: , Rfl:  .  buPROPion (WELLBUTRIN XL) 150 MG 24 hr tablet, Take 450 mg by mouth every morning. , Disp: , Rfl:  .  carvedilol (COREG) 25 MG tablet, Take  1 tablet (25 mg total) by mouth 2 (two) times daily with a meal., Disp: 180 tablet, Rfl: 2 .  latanoprost (XALATAN) 0.005 % ophthalmic solution, Place 1 drop into both eyes every evening., Disp: , Rfl:  .  losartan (COZAAR) 100 MG tablet, Take 1 tablet (100 mg total) by mouth daily., Disp: 90 tablet, Rfl: 2 .  mirtazapine (REMERON) 30 MG tablet, Take 30 mg by mouth at bedtime. , Disp: , Rfl:  .  Multiple Vitamin (MULTIVITAMIN) tablet, Take 1 tablet by mouth every morning. , Disp: , Rfl:  .  polyethylene glycol (MIRALAX / GLYCOLAX) packet, Take 17 g by mouth 3 (three) times a week. Mon, wed, and fri, Disp: , Rfl:    Allergies  Allergen Reactions  . Shellfish Allergy Anaphylaxis  . Codeine Other (See Comments)    Unknown.  Marland Kitchen Hydrocodone Other (See Comments)    Unknown.  . Adhesive [Tape] Rash   Review of Systems:   Pertinent items are noted in the HPI. Otherwise, ROS is negative.  Vitals:   Vitals:   12/28/17 1402  BP: 140/88  Pulse: 80  Temp: 97.9 F (36.6 C)  TempSrc: Oral  SpO2: 97%  Weight: 148 lb 6.4 oz (67.3 kg)     Body mass index  is 24.7 kg/m.  Physical Exam:   Physical Exam  Constitutional: She is oriented to person, place, and time. She appears well-developed and well-nourished. No distress.  HENT:  Head: Normocephalic and atraumatic.  Right Ear: External ear normal.  Left Ear: External ear normal.  Nose: Nose normal.  Mouth/Throat: Oropharynx is clear and moist.  Eyes: Conjunctivae and EOM are normal. Pupils are equal, round, and reactive to light.  Neck: Normal range of motion. Neck supple. No thyromegaly present.  Cardiovascular: Normal rate, regular rhythm and intact distal pulses.  Pulmonary/Chest: Effort normal and breath sounds normal.  Abdominal: Soft. Bowel sounds are normal.  Musculoskeletal: Normal range of motion.  Lymphadenopathy:    She has no cervical adenopathy.  Neurological: She is alert and oriented to person, place, and time.  Skin:  Skin is warm and dry. Capillary refill takes less than 2 seconds.  Psychiatric: She has a normal mood and affect. Her behavior is normal.  Nursing note and vitals reviewed.  Assessment and Plan:   1. Pure hypercholesterolemia Trying to exercise on a regular basis? [employer] 3 times per week Diet Compliance: compliant most of the time. Cardiovascular ROS: no chest pain or dyspnea on exertion.   Lipids:    Component Value Date/Time   CHOL 219 (H) 10/02/2017 0953   TRIG 78.0 10/02/2017 0953   HDL 61.20 10/02/2017 0953   LDLDIRECT 116.5 11/08/2012 0935   VLDL 15.6 10/02/2017 0953   CHOLHDL 4 10/02/2017 0953   - Lipid panel; Future  . Reviewed expectations re: course of current medical issues. . Discussed self-management of symptoms. . Outlined signs and symptoms indicating need for more acute intervention. . Patient verbalized understanding and all questions were answered. Marland Kitchen Health Maintenance issues including appropriate healthy diet, exercise, and smoking avoidance were discussed with patient. . See orders for this visit as documented in the electronic medical record. . Patient received an After Visit Summary.  Helane Rima, DO [place], [facility] 12/30/2017

## 2018-01-01 ENCOUNTER — Other Ambulatory Visit (INDEPENDENT_AMBULATORY_CARE_PROVIDER_SITE_OTHER): Payer: Medicare Other

## 2018-01-01 DIAGNOSIS — E7800 Pure hypercholesterolemia, unspecified: Secondary | ICD-10-CM

## 2018-01-01 LAB — LIPID PANEL
Cholesterol: 196 mg/dL (ref 0–200)
HDL: 60.3 mg/dL (ref >39.00)
LDL Cholesterol: 119 mg/dL — ABNORMAL HIGH (ref 0–99)
NonHDL: 135.61
Total CHOL/HDL Ratio: 3
Triglycerides: 84 mg/dL (ref 0.0–149.0)
VLDL: 16.8 mg/dL (ref 0.0–40.0)

## 2018-02-14 ENCOUNTER — Telehealth: Payer: Self-pay | Admitting: Family Medicine

## 2018-02-14 NOTE — Telephone Encounter (Signed)
The patient would like to schedule an appointment with Dr. Earlene Plater. She states that [facility] is coming for a home visit to her home on 02/21/18 and she would like her appointment to be after that, but is unsure on when Dr. Earlene Plater wanted a f/u from her new patient appointement that was completed in February 2019. Please advise on when you would like to see the patient for a follow up.

## 2018-02-15 NOTE — Telephone Encounter (Signed)
Called patient l/m to let her know she can call to make app anytime in may.

## 2018-02-22 ENCOUNTER — Telehealth: Payer: Self-pay | Admitting: Family Medicine

## 2018-02-22 NOTE — Telephone Encounter (Signed)
See note

## 2018-02-22 NOTE — Telephone Encounter (Signed)
Copied from CRM [id number]. Topic: Quick Communication - See Telephone Encounter >> Feb 22, 2018  2:40 PM Lorrine Kin, [name] wrote: CRM for notification. See Telephone encounter for: 02/22/18. Windell Moulding at [facility] is wanting to check to see if a Medical Consent has been received on this patient for a [employer] balance project? Stated that she had faxed the paperwork over yesterday and has not hear anythign back. Stated that she would refax this paperwork today.

## 2018-02-25 NOTE — Telephone Encounter (Signed)
No ppw received and no return number to call back to let them know.

## 2018-03-18 NOTE — Progress Notes (Signed)
Patient ID: [name] [name], female   DOB: [birth year], 84 y.o.   MRN: [id number]  84 y.o. with history of presumed long standing non ischemic DCM. Normal cath back in 2007. Has been functional class one for age. Echo 02/09/18 EF 40-45% Most recently 03/15/17 decreased to 35% with mild MR and estimated PA systolic pressure of 43 mmHg. She has history of asymptomatic PVC;s. As well as restless legs, HTN, anemia and anxiety / depression   No edema, PND, orthopnea.  Lives with daughter Still driving.  No chest pain palpitations or syncope  She is swimming at [employer] to finish a memoir about the town of [place]    ROS: Denies fever, malais, weight loss, blurry vision, decreased visual acuity, cough, sputum, SOB, hemoptysis, pleuritic pain, palpitaitons, heartburn, abdominal pain, melena, lower extremity edema, claudication, or rash.  All other systems reviewed and negative  General: BP (!) 146/86   Pulse 72   Ht 5\' 5"  (1.651 m)   Wt 148 lb 4 oz (67.2 kg)   SpO2 90%   BMI 24.67 kg/m  Affect appropriate Healthy:  appears stated age HEENT: normal Neck supple with no adenopathy JVP normal no bruits no thyromegaly Lungs clear with no wheezing and good diaphragmatic motion Heart:  S1/S2 no murmur, no rub, gallop or click PMI enlarged  Abdomen: benighn, BS positve, no tenderness, no AAA no bruit.  No HSM or HJR Distal pulses intact with no bruits No edema Neuro non-focal Skin warm and dry No muscular weakness    Current Outpatient Medications  Medication Sig Dispense Refill  . aspirin 81 MG tablet Take 81 mg by mouth daily.    Marland Kitchen buPROPion (WELLBUTRIN XL) 150 MG 24 hr tablet Take 450 mg by mouth every morning.     . carvedilol (COREG) 25 MG tablet Take 1 tablet (25 mg total) by mouth 2 (two) times daily with a meal. 180 tablet 2  . latanoprost (XALATAN) 0.005 % ophthalmic solution Place 1 drop into both eyes every evening.    Marland Kitchen losartan (COZAAR) 100 MG tablet Take 1 tablet  (100 mg total) by mouth daily. 90 tablet 2  . mirtazapine (REMERON) 30 MG tablet Take 30 mg by mouth at bedtime.     . Multiple Vitamin (MULTIVITAMIN) tablet Take 1 tablet by mouth every morning.     . polyethylene glycol (MIRALAX / GLYCOLAX) packet Take 17 g by mouth 3 (three) times a week. Mon, wed, and fri     No current facility-administered medications for this visit.     Allergies  Shellfish allergy; Codeine; Hydrocodone; and Adhesive [tape]  Electrocardiogram:  2/15  SR rate 73  Nonspecific ST changes  01/26/16  SR rate 72 normal  03/05/17 SR rate 71 nonspecific ST changes   Assessment and Plan HTN: Well controlled.  Continue current medications and low sodium Dash type diet.   DCM: Functional class one Echo 03/15/17 EF 35% she is euvolemic and not needing diuretic given age functional class one and asymptomatic status don't see need to discuss entresto  PVC" asymptomatic quiescent continue beta blocker  PFO: not noted on distant  TTE not last few   No TIA symptoms no need for f/u at this time  Anxiety/Depression continue welburin    Charlton Haws

## 2018-03-19 ENCOUNTER — Ambulatory Visit: Payer: Medicare Other | Admitting: Family Medicine

## 2018-03-22 ENCOUNTER — Ambulatory Visit: Payer: Medicare Other | Admitting: Cardiovascular Disease

## 2018-03-22 ENCOUNTER — Encounter: Payer: Self-pay | Admitting: Cardiovascular Disease

## 2018-03-22 VITALS — BP 146/86 | HR 72 | Ht 65.0 in | Wt 148.2 lb

## 2018-03-22 DIAGNOSIS — I420 Dilated cardiomyopathy: Secondary | ICD-10-CM

## 2018-03-22 DIAGNOSIS — I10 Essential (primary) hypertension: Secondary | ICD-10-CM

## 2018-03-22 NOTE — Patient Instructions (Signed)

## 2018-03-29 IMAGING — MR MR CARD MORPHOLOGY WO/W CM
7 of 8 series · 45 of 48 positions shown · IV contrast (multihance)
Comparison: none

CLINICAL DATA: Cardiomyopathy

EXAM:
CARDIAC MRI
TECHNIQUE: The patient was scanned on a 1.5 Tesla GE magnet. A dedicated
cardiac coil was used. Functional imaging was done using Fiesta
sequences. [DATE], and 4 chamber views were done to assess for RWMA's.
Modified Sina rule using a short axis stack was used to
calculate an ejection fraction on a dedicated work station using
Circle software. The patient received 23 cc of Multihance. After 10
minutes inversion recovery sequences were used to assess for
infiltration and scar tissue.
CONTRAST:  23 cc Multihance

[Series 4: bSSFP · sagittal · 8.0mm · 1.29mm/px · 1 of 16 slices shown (1 of 4)]
[im 1/16]
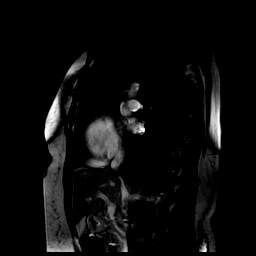

[Series 6: bSSFP · axial · 8.0mm · 1.29mm/px · z∈[-49,-49]mm · 2 of 20 slices shown (2 of 4)]
[im 1/20]
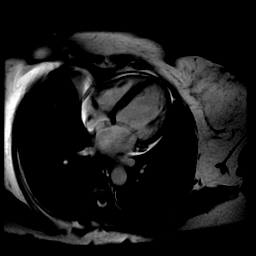
[im 20/20]
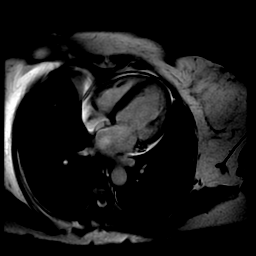

[Series 7: bSSFP · oblique · 8.0mm · 1.41mm/px · 32 of 360 slices shown (3 of 4)]
[im 1/360]
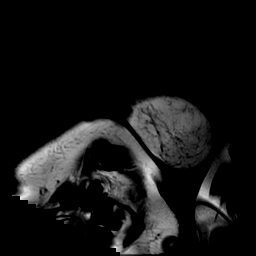
[im 12/360]
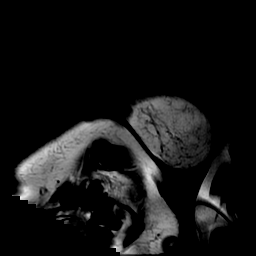
[im 24/360]
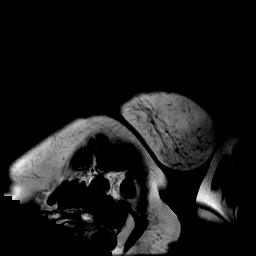
[im 35/360]
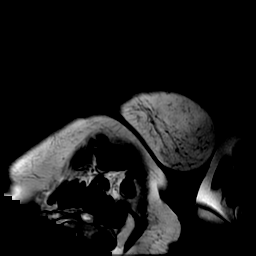
[im 47/360]
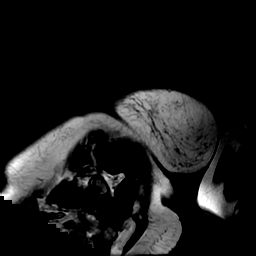
[im 58/360]
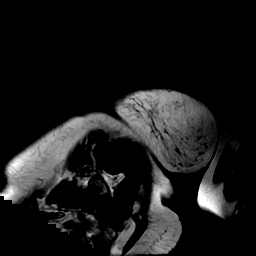
[im 70/360]
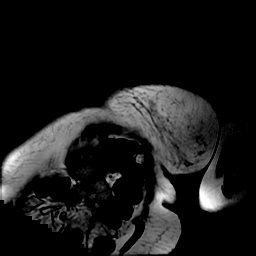
[im 82/360]
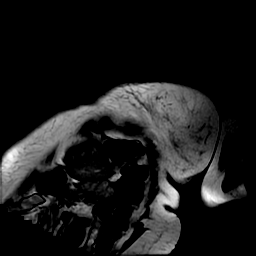
[im 93/360]
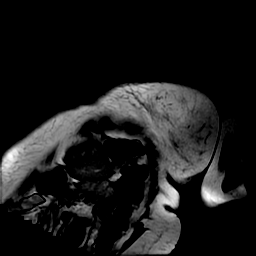
[im 105/360]
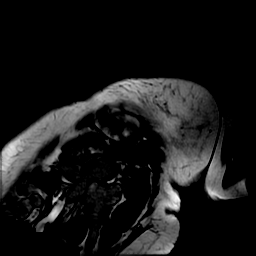
[im 116/360]
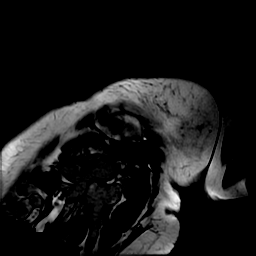
[im 128/360]
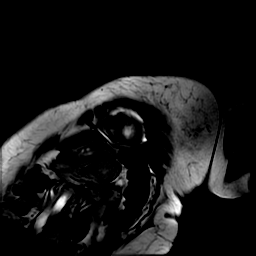
[im 139/360]
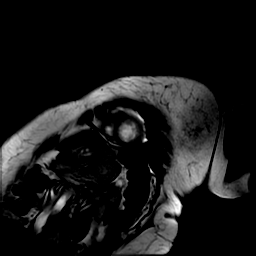
[im 151/360]
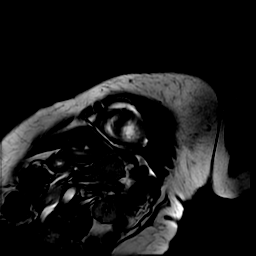
[im 163/360]
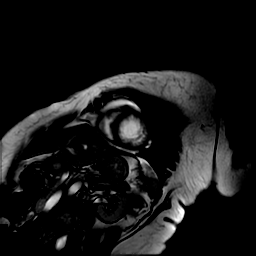
[im 174/360]
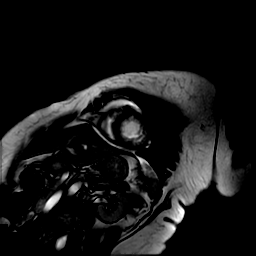
[im 186/360]
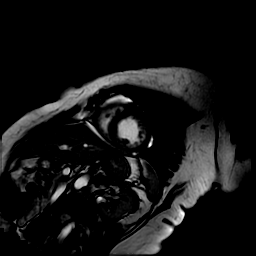
[im 197/360]
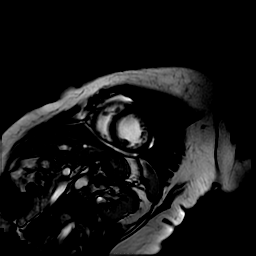
[im 209/360]
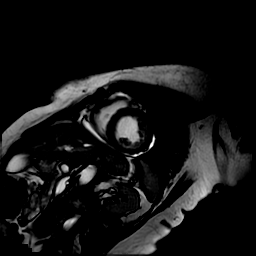
[im 221/360]
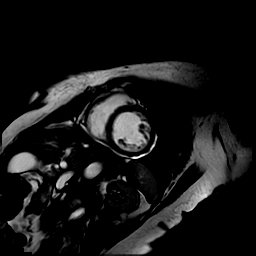
[im 232/360]
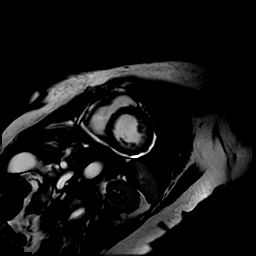
[im 244/360]
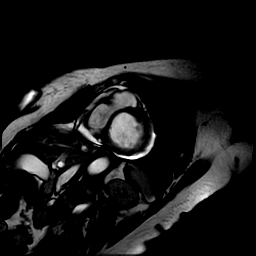
[im 255/360]
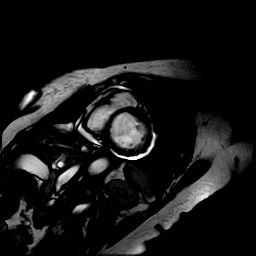
[im 267/360]
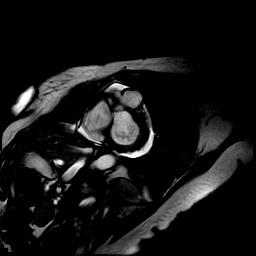
[im 278/360]
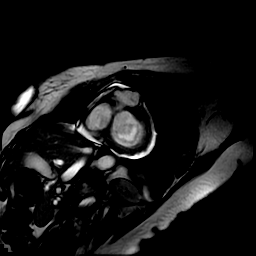
[im 290/360]
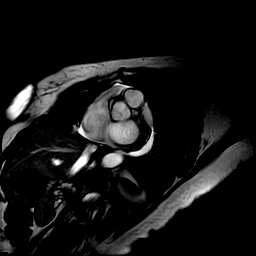
[im 302/360]
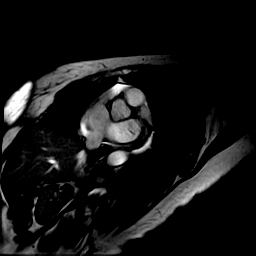
[im 313/360]
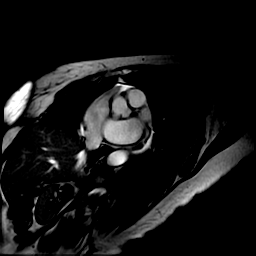
[im 325/360]
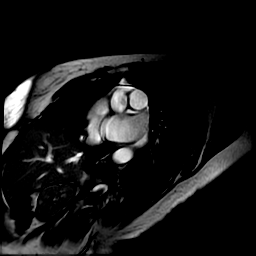
[im 336/360]
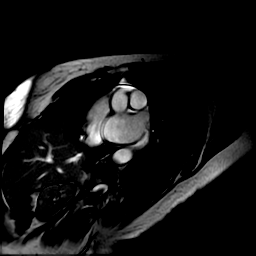
[im 348/360]
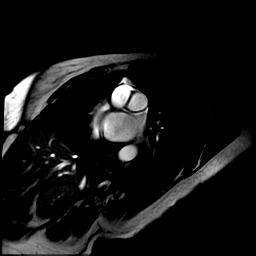
[im 360/360]
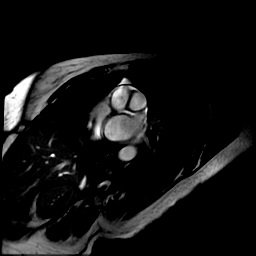

[Series 8: bSSFP · oblique · 8.0mm · 1.33mm/px · 5 of 60 slices shown (4 of 4)]
[im 1/60]
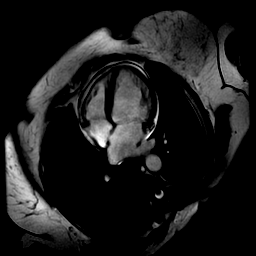
[im 15/60]
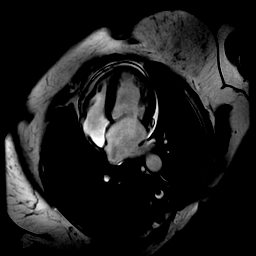
[im 30/60]
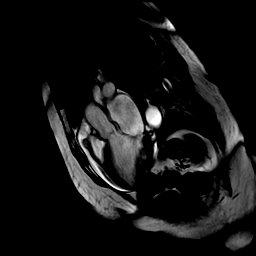
[im 45/60]
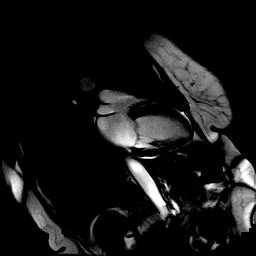
[im 60/60]
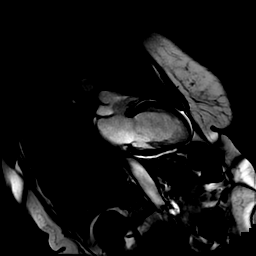

[Series 9: cine ir · oblique · 8.0mm · 1.37mm/px · 3 of 30 slices shown]
[im 1/30]
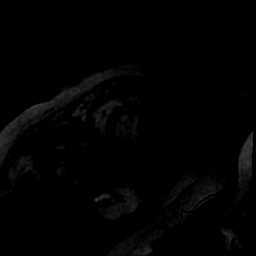
[im 15/30]
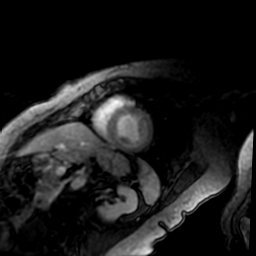
[im 30/30]
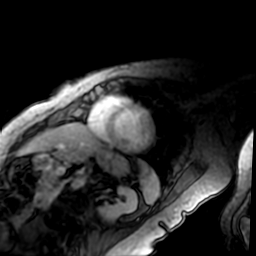

[Series 13: delayed ir prep · oblique · 8.0mm · 1.37mm/px · 1 of 15 slices shown]
[im 1/15]
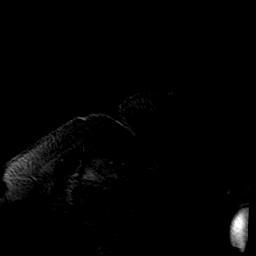

[Series 14: rad delayed ir · oblique · 8.0mm · 1.33mm/px · 1 of 3 slices shown]
[im 1/3]
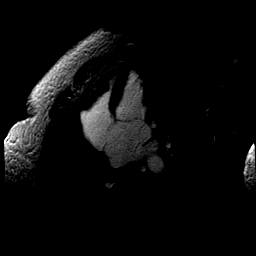

[45 of 48 positions shown; findings below may reference images not displayed]

FINDINGS: There was moderate LAE. The RA / RV were normal in size and
function. The aortic and mitral valves were normal. There was no
ASD/VSD There was a trivial posterior pericardial effusion

The LV was mildly dilated with mild diffuse hypokinesis. The
quantitative EF was 42% (EDV 107 cc ESV 63 cc SV 44 cc) Delayed
enhancement images post gadolinium showed no scar tissue

Or infarct
IMPRESSION: 1) Mild LVE with diffuse hypokinesis EF 42%

2) Moderate LAE

3) Normal RV

4) No delayed enhancement post gadolinium No infarct

Rokhilon Kasang

## 2018-04-01 ENCOUNTER — Ambulatory Visit: Payer: Medicare Other | Admitting: Internal Medicine

## 2018-04-07 ENCOUNTER — Encounter: Payer: Self-pay | Admitting: Family Medicine

## 2018-04-07 DIAGNOSIS — E785 Hyperlipidemia, unspecified: Secondary | ICD-10-CM | POA: Insufficient documentation

## 2018-04-07 NOTE — Progress Notes (Signed)
[name] [name] is a 84 y.o. female is here for follow up.  History of Present Illness:   [name] [name], CMA acting as scribe for Dr. Helane Rima.   HPI: Feels like she has wax build up in both ears would like to have cleaned out if she can today. She has not had any pain or change in hearing.   Patient is due for bone scan.  Last was in 2015.  Diagnosis of osteopenia.  History of elevated creatinine.  Due for recheck.  History of anemia.  Due for recheck.  [facility]  She did have a fall about a month ago while dancing.  She tripped while doing a turn, was able to get up immediately, finish dancing at night, and had no pain the next day.  Review of Systems  Constitutional: Negative for chills, fever, malaise/fatigue and weight loss.  Respiratory: Negative for cough, shortness of breath and wheezing.   Cardiovascular: Negative for chest pain, palpitations and leg swelling.  Gastrointestinal: Negative for abdominal pain, constipation, diarrhea, nausea and vomiting.  Genitourinary: Negative for dysuria and urgency.  Musculoskeletal: Negative for joint pain and myalgias.  Skin: Negative for rash.  Neurological: Negative for dizziness and headaches.  Psychiatric/Behavioral: Negative for depression, substance abuse and suicidal ideas. The patient is not nervous/anxious.    There are no preventive care reminders to display for this patient.   Depression screen [facility] 2/9 04/09/2018 03/20/2017 03/20/2016  Decreased Interest 0 0 0  Down, Depressed, Hopeless 0 0 0  PHQ - 2 Score 0 0 0  Altered sleeping 0 - -  Tired, decreased energy 0 - -  Change in appetite 0 - -  Feeling bad or failure about yourself  0 - -  Trouble concentrating 0 - -  Moving slowly or fidgety/restless 0 - -  Suicidal thoughts 0 - -  PHQ-9 Score 0 - -  Difficult doing work/chores Not difficult at all - -   PMHx, SurgHx, SocialHx, FamHx, Medications, and Allergies were  reviewed in the Visit Navigator and updated as appropriate.   Patient Active Problem List   Diagnosis Date Noted  . HLD (hyperlipidemia) 04/07/2018  . Anemia 09/14/2014  . Gait disorder 03/07/2013  . DJD (degenerative joint disease) 02/09/2010  . PREMATURE VENTRICULAR CONTRACTIONS 07/20/2009  . Anxiety and depression 07/16/2009  . RLS (restless legs syndrome) 07/10/2009  . Cardiomyopathy (HCC) 04/23/2007  . HTN (hypertension) 04/15/2007  . OSTEOPENIA 04/15/2007   Social History   Tobacco Use  . Smoking status: Never Smoker  . Smokeless tobacco: Never Used  Substance Use Topics  . Alcohol use: No    Alcohol/week: 0.0 oz  . Drug use: No   Current Medications and Allergies:   Current Outpatient Medications:  .  aspirin 81 MG tablet, Take 81 mg by mouth daily., Disp: , Rfl:  .  buPROPion (WELLBUTRIN XL) 150 MG 24 hr tablet, Take 450 mg by mouth every morning. , Disp: , Rfl:  .  carvedilol (COREG) 25 MG tablet, Take 1 tablet (25 mg total) by mouth 2 (two) times daily with a meal., Disp: 180 tablet, Rfl: 2 .  latanoprost (XALATAN) 0.005 % ophthalmic solution, Place 1 drop into both eyes every evening., Disp: , Rfl:  .  losartan (COZAAR) 100 MG tablet, Take 1 tablet (100 mg total) by mouth daily., Disp: 90 tablet, Rfl: 2 .  mirtazapine (REMERON) 30 MG tablet, Take 30 mg by mouth at bedtime. ,  Disp: , Rfl:  .  Multiple Vitamin (MULTIVITAMIN) tablet, Take 1 tablet by mouth every morning. , Disp: , Rfl:  .  polyethylene glycol (MIRALAX / GLYCOLAX) packet, Take 17 g by mouth 3 (three) times a week. Mon, wed, and fri, Disp: , Rfl:    Allergies  Allergen Reactions  . Shellfish Allergy Anaphylaxis  . Codeine Other (See Comments)    Unknown.  Marland Kitchen Hydrocodone Other (See Comments)    Unknown.  . Adhesive [Tape] Rash   Review of Systems   Pertinent items are noted in the HPI. Otherwise, ROS is negative.  Vitals:   Vitals:   04/09/18 0952  BP: 134/82  Pulse: 81  Temp: 98.7 F  (37.1 C)  TempSrc: Oral  SpO2: 94%  Weight: 147 lb 9.6 oz (67 kg)  Height: 5\' 5"  (1.651 m)     Body mass index is 24.56 kg/m.  Physical Exam:   Physical Exam  Constitutional: She is oriented to person, place, and time. She appears well-developed and well-nourished. No distress.  HENT:  Head: Normocephalic and atraumatic.  Right Ear: External ear normal.  Left Ear: External ear normal.  Nose: Nose normal.  Mouth/Throat: Oropharynx is clear and moist.  Bilateral cerumen impaction.  Eyes: Pupils are equal, round, and reactive to light. Conjunctivae and EOM are normal.  Neck: Normal range of motion. Neck supple. No thyromegaly present.  Cardiovascular: Normal rate, regular rhythm, normal heart sounds and intact distal pulses.  Pulmonary/Chest: Effort normal and breath sounds normal.  Abdominal: Soft. Bowel sounds are normal.  Musculoskeletal: Normal range of motion.  Lymphadenopathy:    She has no cervical adenopathy.  Neurological: She is alert and oriented to person, place, and time.  Skin: Skin is warm and dry. Capillary refill takes less than 2 seconds.  Psychiatric: She has a normal mood and affect. Her behavior is normal.  Nursing note and vitals reviewed.   Assessment and Plan:   [name] was seen today for follow-up.  Diagnoses and all orders for this visit:  Essential hypertension  Anemia, unspecified type -     CBC with Differential/Platelet  Hyperlipidemia, unspecified hyperlipidemia type -     Comprehensive metabolic panel  Bilateral impacted cerumen Comments: Procedure: Cerumen Disimpaction Hydrogen peroxide drops applied and gentle ear lavage performed bilaterally. There were no complications and following the disimpaction the tympanic membrane was visible bilaterally. Tympanic membranes are intact following the procedure.  Auditory canals are normal.  The patient reported relief of symptoms after removal of cerumen.   Osteopenia, unspecified location -      DG Bone Density; Future   . Reviewed expectations re: course of current medical issues. . Discussed self-management of symptoms. . Outlined signs and symptoms indicating need for more acute intervention. . Patient verbalized understanding and all questions were answered. Marland Kitchen Health Maintenance issues including appropriate healthy diet, exercise, and smoking avoidance were discussed with patient. . See orders for this visit as documented in the electronic medical record. . Patient received an After Visit Summary.  Helane Rima, DO [place], [facility] 04/09/2018  No future appointments.  CMA served as Neurosurgeon during this visit. History, Physical, and Plan performed by medical provider. The above documentation has been reviewed and is accurate and complete. Helane Rima, D.O.

## 2018-04-09 ENCOUNTER — Ambulatory Visit: Payer: Medicare Other | Admitting: Family Medicine

## 2018-04-09 ENCOUNTER — Encounter: Payer: Self-pay | Admitting: Family Medicine

## 2018-04-09 VITALS — BP 134/82 | HR 81 | Temp 98.7°F | Ht 65.0 in | Wt 147.6 lb

## 2018-04-09 DIAGNOSIS — H6123 Impacted cerumen, bilateral: Secondary | ICD-10-CM | POA: Diagnosis not present

## 2018-04-09 DIAGNOSIS — E2839 Other primary ovarian failure: Secondary | ICD-10-CM | POA: Diagnosis not present

## 2018-04-09 DIAGNOSIS — E785 Hyperlipidemia, unspecified: Secondary | ICD-10-CM | POA: Diagnosis not present

## 2018-04-09 DIAGNOSIS — D649 Anemia, unspecified: Secondary | ICD-10-CM | POA: Diagnosis not present

## 2018-04-09 DIAGNOSIS — M8580 Other specified disorders of bone density and structure, unspecified site: Secondary | ICD-10-CM | POA: Diagnosis not present

## 2018-04-09 DIAGNOSIS — I10 Essential (primary) hypertension: Secondary | ICD-10-CM

## 2018-04-09 LAB — CBC WITH DIFFERENTIAL/PLATELET
Basophils Absolute: 0.1 10*3/uL (ref 0.0–0.1)
Basophils Relative: 1.3 % (ref 0.0–3.0)
Eosinophils Absolute: 0.1 10*3/uL (ref 0.0–0.7)
Eosinophils Relative: 2.4 % (ref 0.0–5.0)
HCT: 36.3 % (ref 36.0–46.0)
Hemoglobin: 12.4 g/dL (ref 12.0–15.0)
Lymphocytes Relative: 28.7 % (ref 12.0–46.0)
Lymphs Abs: 1.3 10*3/uL (ref 0.7–4.0)
MCHC: 34.1 g/dL (ref 30.0–36.0)
MCV: 98.2 fl (ref 78.0–100.0)
Monocytes Absolute: 0.5 10*3/uL (ref 0.1–1.0)
Monocytes Relative: 11 % (ref 3.0–12.0)
Neutro Abs: 2.6 10*3/uL (ref 1.4–7.7)
Neutrophils Relative %: 56.6 % (ref 43.0–77.0)
Platelets: 167 10*3/uL (ref 150.0–400.0)
RBC: 3.69 Mil/uL — ABNORMAL LOW (ref 3.87–5.11)
RDW: 14.4 % (ref 11.5–15.5)
WBC: 4.7 10*3/uL (ref 4.0–10.5)

## 2018-04-09 LAB — COMPREHENSIVE METABOLIC PANEL
ALT: 11 U/L (ref 0–35)
AST: 15 U/L (ref 0–37)
Albumin: 4.1 g/dL (ref 3.5–5.2)
Alkaline Phosphatase: 45 U/L (ref 39–117)
BUN: 23 mg/dL (ref 6–23)
CO2: 26 mEq/L (ref 19–32)
Calcium: 9.9 mg/dL (ref 8.4–10.5)
Chloride: 106 mEq/L (ref 96–112)
Creatinine, Ser: 1.23 mg/dL — ABNORMAL HIGH (ref 0.40–1.20)
GFR: 52.9 mL/min — ABNORMAL LOW (ref >60.00)
Glucose, Bld: 76 mg/dL (ref 70–99)
Potassium: 4.5 mEq/L (ref 3.5–5.1)
Sodium: 141 mEq/L (ref 135–145)
Total Bilirubin: 0.4 mg/dL (ref 0.2–1.2)
Total Protein: 6.6 g/dL (ref 6.0–8.3)

## 2018-04-11 ENCOUNTER — Ambulatory Visit (INDEPENDENT_AMBULATORY_CARE_PROVIDER_SITE_OTHER)
Admission: RE | Admit: 2018-04-11 | Discharge: 2018-04-11 | Disposition: A | Discharge status: Home / Self Care | Payer: Medicare Other | Source: Ambulatory Visit | Attending: Family Medicine | Admitting: Family Medicine

## 2018-04-11 DIAGNOSIS — E2839 Other primary ovarian failure: Secondary | ICD-10-CM

## 2018-04-11 DIAGNOSIS — M8580 Other specified disorders of bone density and structure, unspecified site: Secondary | ICD-10-CM

## 2018-04-16 ENCOUNTER — Encounter: Payer: Self-pay | Admitting: Family Medicine

## 2018-07-16 ENCOUNTER — Ambulatory Visit: Payer: Medicare Other | Admitting: Family Medicine

## 2018-07-26 ENCOUNTER — Ambulatory Visit: Payer: Medicare Other | Admitting: Family Medicine

## 2018-07-26 ENCOUNTER — Encounter: Payer: Self-pay | Admitting: Family Medicine

## 2018-07-26 VITALS — BP 136/78 | HR 86 | Temp 98.6°F | Ht 65.0 in | Wt 147.0 lb

## 2018-07-26 DIAGNOSIS — I10 Essential (primary) hypertension: Secondary | ICD-10-CM | POA: Diagnosis not present

## 2018-07-26 DIAGNOSIS — F419 Anxiety disorder, unspecified: Secondary | ICD-10-CM | POA: Diagnosis not present

## 2018-07-26 DIAGNOSIS — H6123 Impacted cerumen, bilateral: Secondary | ICD-10-CM

## 2018-07-26 DIAGNOSIS — F329 Major depressive disorder, single episode, unspecified: Secondary | ICD-10-CM | POA: Diagnosis not present

## 2018-07-26 DIAGNOSIS — H9193 Unspecified hearing loss, bilateral: Secondary | ICD-10-CM

## 2018-07-26 DIAGNOSIS — F32A Depression, unspecified: Secondary | ICD-10-CM

## 2018-07-26 NOTE — Progress Notes (Signed)
[name] [name] is a 84 y.o. female is here for follow up.  History of Present Illness:   [name], CMA acting as scribe for Dr. Helane Rima.   HPI: Patient in office for follow up. She has brought notes from Dr. Donell Beers (Psychiatry) for [place] to add to our chart. She also is starting to have fullness in both ears for about three weeks. No pain or drainage in both ears.  There are no preventive care reminders to display for this patient.   Depression screen [facility] 2/9 07/26/2018 04/09/2018 03/20/2017  Decreased Interest 0 0 0  Down, Depressed, Hopeless 0 0 0  PHQ - 2 Score 0 0 0  Altered sleeping 0 0 -  Tired, decreased energy 0 0 -  Change in appetite 0 0 -  Feeling bad or failure about yourself  0 0 -  Trouble concentrating 0 0 -  Moving slowly or fidgety/restless 0 0 -  Suicidal thoughts 0 0 -  PHQ-9 Score 0 0 -  Difficult doing work/chores Not difficult at all Not difficult at all -   PMHx, SurgHx, SocialHx, FamHx, Medications, and Allergies were reviewed in the Visit Navigator and updated as appropriate.   Patient Active Problem List   Diagnosis Date Noted  . HLD (hyperlipidemia) 04/07/2018  . Anemia 09/14/2014  . Gait disorder 03/07/2013  . DJD (degenerative joint disease) 02/09/2010  . PVCs (premature ventricular contractions) 07/20/2009  . Anxiety and depression 07/16/2009  . RLS (restless legs syndrome) 07/10/2009  . Cardiomyopathy (HCC) 04/23/2007  . HTN (hypertension) 04/15/2007  . Osteopenia 04/15/2007   Social History   Tobacco Use  . Smoking status: Never Smoker  . Smokeless tobacco: Never Used  Substance Use Topics  . Alcohol use: No    Alcohol/week: 0.0 standard drinks  . Drug use: No   Current Medications and Allergies:   .  aspirin 81 MG tablet, Take 81 mg by mouth daily., Disp: , Rfl:  .  buPROPion (WELLBUTRIN XL) 150 MG 24 hr tablet, Take 450 mg by mouth every morning. , Disp: , Rfl:  .  carvedilol (COREG) 25 MG tablet, Take 1 tablet  (25 mg total) by mouth 2 (two) times daily with a meal., Disp: 180 tablet, Rfl: 2 .  latanoprost (XALATAN) 0.005 % ophthalmic solution, Place 1 drop into both eyes every evening., Disp: , Rfl:  .  losartan (COZAAR) 100 MG tablet, Take 1 tablet (100 mg total) by mouth daily., Disp: 90 tablet, Rfl: 2 .  mirtazapine (REMERON) 30 MG tablet, Take 30 mg by mouth at bedtime. , Disp: , Rfl:  .  Multiple Vitamin (MULTIVITAMIN) tablet, Take 1 tablet by mouth every morning. , Disp: , Rfl:  .  polyethylene glycol (MIRALAX / GLYCOLAX) packet, Take 17 g by mouth 3 (three) times a week. Mon, wed, and fri, Disp: , Rfl:  .  tobramycin-dexamethasone (TOBRADEX) ophthalmic solution, INSTILL 1 DROP INTO LEFT EYE THREE TIMES A DAY AS DIRECTED, Disp: , Rfl: 0   Allergies  Allergen Reactions  . Shellfish Allergy Anaphylaxis  . Codeine Other (See Comments)    Unknown.  Marland Kitchen Hydrocodone Other (See Comments)    Unknown.  . Adhesive [Tape] Rash   Review of Systems   Pertinent items are noted in the HPI. Otherwise, ROS is negative.  Vitals:   Vitals:   07/26/18 1113  BP: 136/78  Pulse: 86  Temp: 98.6 F (37 C)  TempSrc: Oral  SpO2: 98%  Weight: 147 lb (66.7  kg)  Height: 5\' 5"  (1.651 m)     Body mass index is 24.46 kg/m.  Physical Exam:   Physical Exam  Constitutional: She is oriented to person, place, and time. She appears well-developed and well-nourished. No distress.  HENT:  Head: Normocephalic and atraumatic.  Right Ear: External ear normal.  Left Ear: External ear normal.  Nose: Nose normal.  Mouth/Throat: Oropharynx is clear and moist.  Bilateral cerumen impaction.  Eyes: Pupils are equal, round, and reactive to light. Conjunctivae and EOM are normal.  Neck: Normal range of motion. Neck supple. No thyromegaly present.  Cardiovascular: Normal rate, regular rhythm, normal heart sounds and intact distal pulses.  Pulmonary/Chest: Effort normal and breath sounds normal.  Abdominal: Soft. Bowel  sounds are normal.  Musculoskeletal: Normal range of motion.  Lymphadenopathy:    She has no cervical adenopathy.  Neurological: She is alert and oriented to person, place, and time.  Skin: Skin is warm and dry. Capillary refill takes less than 2 seconds.  Psychiatric: She has a normal mood and affect. Her behavior is normal.  Nursing note and vitals reviewed.  Assessment and Plan:   [name] was seen today for follow-up.  Diagnoses and all orders for this visit:  Bilateral impacted cerumen Comments: Lavage in usual manner without complications.   Decreased hearing of both ears Comments: Failed hearing screen. She will call audiologist.  Orders: -     Cancel: Ambulatory referral to Audiology  Essential hypertension Comments: At goal. No CP, SOB, HA, dizziness, edema.   Anxiety and depression Comments: Doing well. Reviewed Psychiatry note with patient today. She continues to [employer].     . Reviewed expectations re: course of current medical issues. . Discussed self-management of symptoms. . Outlined signs and symptoms indicating need for more acute intervention. . Patient verbalized understanding and all questions were answered. Marland Kitchen Health Maintenance issues including appropriate healthy diet, exercise, and smoking avoidance were discussed with patient. . See orders for this visit as documented in the electronic medical record. . Patient received an After Visit Summary.  CMA served as Neurosurgeon during this visit. History, Physical, and Plan performed by medical provider. The above documentation has been reviewed and is accurate and complete. Helane Rima, D.O.  Helane Rima, DO [place], [facility] 07/26/2018

## 2018-09-18 NOTE — Progress Notes (Signed)
Patient ID: [name] [name], female   DOB: [birth year], 84 y.o.   MRN: [id number]  84 y.o. with history of presumed long standing non ischemic DCM. Normal cath back in 2007. Has been functional class one for age. Echo  03/15/17 EF  35% with mild MR and estimated PA systolic pressure of 43 mmHg. She has history of asymptomatic PVC;s. As well as restless legs, HTN, anemia and anxiety / depression   No edema, PND, orthopnea.  Lives with daughter Still driving.  No chest pain palpitations or syncope  She is swimming at [employer] to finish a memoir about the town of [place]     ROS: Denies fever, malais, weight loss, blurry vision, decreased visual acuity, cough, sputum, SOB, hemoptysis, pleuritic pain, palpitaitons, heartburn, abdominal pain, melena, lower extremity edema, claudication, or rash.  All other systems reviewed and negative  General: BP 138/76   Pulse 81   Ht 5\' 5"  (1.651 m)   Wt 149 lb 4 oz (67.7 kg)   BMI 24.84 kg/m  Affect appropriate Healthy:  appears stated age HEENT: normal Neck supple with no adenopathy JVP normal no bruits no thyromegaly Lungs clear with no wheezing and good diaphragmatic motion Heart:  S1/S2 no murmur, no rub, gallop or click PMI enlarged  Abdomen: benighn, BS positve, no tenderness, no AAA no bruit.  No HSM or HJR Distal pulses intact with no bruits No edema Neuro non-focal Skin warm and dry No muscular weakness     Current Outpatient Medications  Medication Sig Dispense Refill  . aspirin 81 MG tablet Take 81 mg by mouth daily.    Marland Kitchen buPROPion (WELLBUTRIN XL) 150 MG 24 hr tablet Take 450 mg by mouth every morning.     . carvedilol (COREG) 25 MG tablet Take 1 tablet (25 mg total) by mouth 2 (two) times daily with a meal. 180 tablet 2  . latanoprost (XALATAN) 0.005 % ophthalmic solution Place 1 drop into both eyes every evening.    Marland Kitchen losartan (COZAAR) 100 MG tablet Take 1 tablet (100 mg total) by mouth daily. 90 tablet 2  .  mirtazapine (REMERON) 30 MG tablet Take 30 mg by mouth at bedtime.     . Multiple Vitamin (MULTIVITAMIN) tablet Take 1 tablet by mouth every morning.     . polyethylene glycol (MIRALAX / GLYCOLAX) packet Take 17 g by mouth 3 (three) times a week. Mon, wed, and fri     No current facility-administered medications for this visit.     Allergies  Shellfish allergy; Codeine; Hydrocodone; and Adhesive [tape]  Electrocardiogram:  09/26/18  SR rate 81 LVH with strain no changes  Assessment and Plan HTN: Well controlled.  Continue current medications and low sodium Dash type diet.   DCM: Functional class one Echo 03/15/17 EF 35% she is euvolemic and not needing diuretic given age functional class one and asymptomatic status don't see need to discuss entresto  PVC" asymptomatic quiescent continue beta blocker  PFO: not noted on distant  TTE not last few   No TIA symptoms no need for f/u at this time  Anxiety/Depression continue welburin    Charlton Haws

## 2018-09-26 ENCOUNTER — Encounter: Payer: Self-pay | Admitting: Cardiovascular Disease

## 2018-09-26 ENCOUNTER — Ambulatory Visit: Payer: Medicare Other | Admitting: Cardiovascular Disease

## 2018-09-26 VITALS — BP 138/76 | HR 81 | Ht 65.0 in | Wt 149.2 lb

## 2018-09-26 DIAGNOSIS — I420 Dilated cardiomyopathy: Secondary | ICD-10-CM

## 2018-09-26 DIAGNOSIS — I10 Essential (primary) hypertension: Secondary | ICD-10-CM

## 2018-09-26 NOTE — Patient Instructions (Signed)

## 2018-10-19 ENCOUNTER — Other Ambulatory Visit: Payer: Self-pay | Admitting: Family Medicine

## 2018-10-25 ENCOUNTER — Encounter: Payer: Self-pay | Admitting: Family Medicine

## 2018-10-25 ENCOUNTER — Ambulatory Visit: Payer: Medicare Other | Admitting: Family Medicine

## 2018-10-25 VITALS — BP 156/98 | HR 76 | Temp 97.6°F | Ht 65.0 in | Wt 151.2 lb

## 2018-10-25 DIAGNOSIS — D649 Anemia, unspecified: Secondary | ICD-10-CM

## 2018-10-25 DIAGNOSIS — H6121 Impacted cerumen, right ear: Secondary | ICD-10-CM | POA: Diagnosis not present

## 2018-10-25 DIAGNOSIS — F419 Anxiety disorder, unspecified: Secondary | ICD-10-CM

## 2018-10-25 DIAGNOSIS — I10 Essential (primary) hypertension: Secondary | ICD-10-CM | POA: Diagnosis not present

## 2018-10-25 DIAGNOSIS — F329 Major depressive disorder, single episode, unspecified: Secondary | ICD-10-CM

## 2018-10-25 DIAGNOSIS — E7800 Pure hypercholesterolemia, unspecified: Secondary | ICD-10-CM | POA: Diagnosis not present

## 2018-10-25 DIAGNOSIS — F32A Depression, unspecified: Secondary | ICD-10-CM

## 2018-10-25 LAB — COMPREHENSIVE METABOLIC PANEL
ALT: 10 U/L (ref 0–35)
AST: 16 U/L (ref 0–37)
Albumin: 4.3 g/dL (ref 3.5–5.2)
Alkaline Phosphatase: 42 U/L (ref 39–117)
BUN: 24 mg/dL — ABNORMAL HIGH (ref 6–23)
CO2: 26 mEq/L (ref 19–32)
Calcium: 9.6 mg/dL (ref 8.4–10.5)
Chloride: 104 mEq/L (ref 96–112)
Creatinine, Ser: 1.16 mg/dL (ref 0.40–1.20)
GFR: 56.53 mL/min — ABNORMAL LOW (ref >60.00)
Glucose, Bld: 89 mg/dL (ref 70–99)
Potassium: 3.9 mEq/L (ref 3.5–5.1)
Sodium: 140 mEq/L (ref 135–145)
Total Bilirubin: 0.5 mg/dL (ref 0.2–1.2)
Total Protein: 6.6 g/dL (ref 6.0–8.3)

## 2018-10-25 LAB — CBC WITH DIFFERENTIAL/PLATELET
Basophils Absolute: 0 10*3/uL (ref 0.0–0.1)
Basophils Relative: 0.9 % (ref 0.0–3.0)
Eosinophils Absolute: 0.1 10*3/uL (ref 0.0–0.7)
Eosinophils Relative: 1.6 % (ref 0.0–5.0)
HCT: 39.7 % (ref 36.0–46.0)
Hemoglobin: 13.2 g/dL (ref 12.0–15.0)
Lymphocytes Relative: 32.5 % (ref 12.0–46.0)
Lymphs Abs: 1.7 10*3/uL (ref 0.7–4.0)
MCHC: 33.2 g/dL (ref 30.0–36.0)
MCV: 99.6 fl (ref 78.0–100.0)
Monocytes Absolute: 0.6 10*3/uL (ref 0.1–1.0)
Monocytes Relative: 11.7 % (ref 3.0–12.0)
Neutro Abs: 2.9 10*3/uL (ref 1.4–7.7)
Neutrophils Relative %: 53.3 % (ref 43.0–77.0)
Platelets: 167 10*3/uL (ref 150.0–400.0)
RBC: 3.99 Mil/uL (ref 3.87–5.11)
RDW: 14.2 % (ref 11.5–15.5)
WBC: 5.4 10*3/uL (ref 4.0–10.5)

## 2018-10-25 LAB — LIPID PANEL
Cholesterol: 189 mg/dL (ref 0–200)
HDL: 61.2 mg/dL (ref >39.00)
LDL Cholesterol: 108 mg/dL — ABNORMAL HIGH (ref 0–99)
NonHDL: 128.23
Total CHOL/HDL Ratio: 3
Triglycerides: 102 mg/dL (ref 0.0–149.0)
VLDL: 20.4 mg/dL (ref 0.0–40.0)

## 2018-10-25 LAB — TSH: TSH: 1.8 u[IU]/mL (ref 0.35–4.50)

## 2018-10-27 ENCOUNTER — Encounter: Payer: Self-pay | Admitting: Family Medicine

## 2018-10-27 DIAGNOSIS — H6121 Impacted cerumen, right ear: Secondary | ICD-10-CM | POA: Insufficient documentation

## 2018-10-27 NOTE — Patient Instructions (Signed)

## 2018-10-27 NOTE — Assessment & Plan Note (Signed)
PROCEDURE: CERUMEN DISIMPACTION    The patient had a large amount of cerumen in the external auditory canal(s): right.  Ear wax softener was not used prior to the lavage.  Ear lavage was performed on right ear(s) by CMA.  There were no complications and following the disimpaction the tympanic membrane was visible.  Charges to be entered in Charge Capture section.

## 2018-10-27 NOTE — Progress Notes (Signed)
[name] [name] [name] [name] is a 84 y.o. female is here for follow up.  History of Present Illness:   HPI: See Assessment and Plan section for Problem Based Charting of issues discussed today.   There are no preventive care reminders to display for this patient.   Depression screen [facility] 2/9 07/26/2018 04/09/2018 03/20/2017  Decreased Interest 0 0 0  Down, Depressed, Hopeless 0 0 0  PHQ - 2 Score 0 0 0  Altered sleeping 0 0 -  Tired, decreased energy 0 0 -  Change in appetite 0 0 -  Feeling bad or failure about yourself  0 0 -  Trouble concentrating 0 0 -  Moving slowly or fidgety/restless 0 0 -  Suicidal thoughts 0 0 -  PHQ-9 Score 0 0 -  Difficult doing work/chores Not difficult at all Not difficult at all -   PMHx, SurgHx, SocialHx, FamHx, Medications, and Allergies were reviewed in the Visit Navigator and updated as appropriate.   Patient Active Problem List   Diagnosis Date Noted  . Impacted cerumen of right ear 10/27/2018  . HLD (hyperlipidemia) 04/07/2018  . DJD (degenerative joint disease) 02/09/2010  . PVCs (premature ventricular contractions) 07/20/2009  . Anxiety and depression 07/16/2009  . Cardiomyopathy (HCC) 04/23/2007  . HTN (hypertension) 04/15/2007  . Osteopenia 04/15/2007   Social History   Tobacco Use  . Smoking status: Never Smoker  . Smokeless tobacco: Never Used  Substance Use Topics  . Alcohol use: No    Alcohol/week: 0.0 standard drinks  . Drug use: No   Current Medications and Allergies:   .  aspirin 81 MG tablet, Take 81 mg by mouth daily., Disp: , Rfl:  .  buPROPion (WELLBUTRIN XL) 150 MG 24 hr tablet, Take 450 mg by mouth every morning. , Disp: , Rfl:  .  carvedilol (COREG) 25 MG tablet, TAKE 1 TABLET (25 MG TOTAL) BY MOUTH 2 (TWO) TIMES DAILY WITH A MEAL., Disp: 180 tablet, Rfl: 1 .  latanoprost (XALATAN) 0.005 % ophthalmic solution, Place 1 drop into both eyes every evening., Disp: , Rfl:  .  losartan (COZAAR) 100 MG tablet, Take 1 tablet (100  mg total) by mouth daily., Disp: 90 tablet, Rfl: 2 .  mirtazapine (REMERON) 30 MG tablet, Take 30 mg by mouth at bedtime. , Disp: , Rfl:  .  Multiple Vitamin (MULTIVITAMIN) tablet, Take 1 tablet by mouth every morning. , Disp: , Rfl:  .  polyethylene glycol (MIRALAX / GLYCOLAX) packet, Take 17 g by mouth 3 (three) times a week. Mon, wed, and fri, Disp: , Rfl:    Allergies  Allergen Reactions  . Shellfish Allergy Anaphylaxis  . Codeine Other (See Comments)    Unknown.  Marland Kitchen Hydrocodone Other (See Comments)    Unknown.  . Adhesive [Tape] Rash   Review of Systems   Pertinent items are noted in the HPI. Otherwise, a complete ROS is negative.  Vitals:   Vitals:   10/25/18 1127 10/25/18 1131  BP: (!) 158/102 (!) 156/98  Pulse: 76   Temp: 97.6 F (36.4 C)   TempSrc: Oral   SpO2: 100%   Weight: 151 lb 3.2 oz (68.6 kg)   Height: 5\' 5"  (1.651 m)      Body mass index is 25.16 kg/m.  Physical Exam:   Physical Exam Vitals signs and nursing note reviewed.  Constitutional:      General: She is not in acute distress.    Appearance: She is well-developed.  HENT:  Head: Normocephalic and atraumatic.     Right Ear: External ear normal.     Left Ear: External ear normal.     Ears:     Comments: Right ear cerumen impaction.    Nose: Nose normal.  Eyes:     Conjunctiva/sclera: Conjunctivae normal.     Pupils: Pupils are equal, round, and reactive to light.  Neck:     Musculoskeletal: Normal range of motion and neck supple.     Thyroid: No thyromegaly.  Cardiovascular:     Rate and Rhythm: Normal rate and regular rhythm.     Heart sounds: Normal heart sounds.  Pulmonary:     Effort: Pulmonary effort is normal.     Breath sounds: Normal breath sounds.  Abdominal:     General: Bowel sounds are normal.     Palpations: Abdomen is soft.  Musculoskeletal: Normal range of motion.  Lymphadenopathy:     Cervical: No cervical adenopathy.  Skin:    General: Skin is warm and dry.      Capillary Refill: Capillary refill takes less than 2 seconds.  Neurological:     Mental Status: She is alert and oriented to person, place, and time.  Psychiatric:        Behavior: Behavior normal.    Results for orders placed or performed in visit on 10/25/18  CBC with Differential/Platelet  Result Value Ref Range   WBC 5.4 4.0 - 10.5 K/uL   RBC 3.99 3.87 - 5.11 Mil/uL   Hemoglobin 13.2 12.0 - 15.0 g/dL   HCT 11.8 86.7 - 73.7 %   MCV 99.6 78.0 - 100.0 fl   MCHC 33.2 30.0 - 36.0 g/dL   RDW 36.6 81.5 - 94.7 %   Platelets 167.0 150.0 - 400.0 K/uL   Neutrophils Relative % 53.3 43.0 - 77.0 %   Lymphocytes Relative 32.5 12.0 - 46.0 %   Monocytes Relative 11.7 3.0 - 12.0 %   Eosinophils Relative 1.6 0.0 - 5.0 %   Basophils Relative 0.9 0.0 - 3.0 %   Neutro Abs 2.9 1.4 - 7.7 K/uL   Lymphs Abs 1.7 0.7 - 4.0 K/uL   Monocytes Absolute 0.6 0.1 - 1.0 K/uL   Eosinophils Absolute 0.1 0.0 - 0.7 K/uL   Basophils Absolute 0.0 0.0 - 0.1 K/uL  Comprehensive metabolic panel  Result Value Ref Range   Sodium 140 135 - 145 mEq/L   Potassium 3.9 3.5 - 5.1 mEq/L   Chloride 104 96 - 112 mEq/L   CO2 26 19 - 32 mEq/L   Glucose, Bld 89 70 - 99 mg/dL   BUN 24 (H) 6 - 23 mg/dL   Creatinine, Ser 0.76 0.40 - 1.20 mg/dL   Total Bilirubin 0.5 0.2 - 1.2 mg/dL   Alkaline Phosphatase 42 39 - 117 U/L   AST 16 0 - 37 U/L   ALT 10 0 - 35 U/L   Total Protein 6.6 6.0 - 8.3 g/dL   Albumin 4.3 3.5 - 5.2 g/dL   Calcium 9.6 8.4 - 15.1 mg/dL   GFR 83.43 (L) >73.57 mL/min  Lipid panel  Result Value Ref Range   Cholesterol 189 0 - 200 mg/dL   Triglycerides 897.8 0.0 - 149.0 mg/dL   HDL 47.84 >12.82 mg/dL   VLDL 08.1 0.0 - 38.8 mg/dL   LDL Cholesterol 719 (H) 0 - 99 mg/dL   Total CHOL/HDL Ratio 3    NonHDL 128.23   TSH  Result Value Ref Range   TSH 1.80  0.35 - 4.50 uIU/mL    Assessment and Plan:   HTN (hypertension) History: Review: taking medications as instructed, no medication side effects noted, no  TIAs, no chest pain on exertion, no dyspnea on exertion, no swelling of ankles. Smoker: No.  BP Readings from Last 3 Encounters:  10/25/18 (!) 156/98  09/26/18 138/76  07/26/18 136/78   Lab Results  Component Value Date   CREATININE 1.16 10/25/2018     Assessment/Plan: 1. Medication: no change today but patient will monitor BP at home and call if pressures stay > 140/90. 2. Dietary sodium restriction. 3. Regular aerobic exercise. 4. Check blood pressures daily and record.  Impacted cerumen of right ear PROCEDURE: CERUMEN DISIMPACTION    The patient had a large amount of cerumen in the external auditory canal(s): right.  Ear wax softener was not used prior to the lavage.  Ear lavage was performed on right ear(s) by CMA.  There were no complications and following the disimpaction the tympanic membrane was visible.  Charges to be entered in Charge Capture section.  Orders Placed This Encounter  Procedures  . CBC with Differential/Platelet  . Comprehensive metabolic panel  . Lipid panel  . TSH  . Ear Lavage   . Reviewed expectations re: course of current medical issues. . Discussed self-management of symptoms. . Outlined signs and symptoms indicating need for more acute intervention. . Patient verbalized understanding and all questions were answered. Marland Kitchen Health Maintenance issues including appropriate healthy diet, exercise, and smoking avoidance were discussed with patient. . See orders for this visit as documented in the electronic medical record. . Patient received an After Visit Summary.  Helane Rima, DO [place], [facility] 10/27/2018

## 2018-10-27 NOTE — Assessment & Plan Note (Signed)
History: Review: taking medications as instructed, no medication side effects noted, no TIAs, no chest pain on exertion, no dyspnea on exertion, no swelling of ankles. Smoker: No.  BP Readings from Last 3 Encounters:  10/25/18 (!) 156/98  09/26/18 138/76  07/26/18 136/78   Lab Results  Component Value Date   CREATININE 1.16 10/25/2018     Assessment/Plan: 1. Medication: no change today but patient will monitor BP at home and call if pressures stay > 140/90. 2. Dietary sodium restriction. 3. Regular aerobic exercise. 4. Check blood pressures daily and record.

## 2018-11-05 ENCOUNTER — Telehealth: Payer: Self-pay | Admitting: Family Medicine

## 2018-11-05 NOTE — Telephone Encounter (Signed)
Left message to return phone call.

## 2018-11-05 NOTE — Telephone Encounter (Signed)
Pt call [name] [name] back

## 2018-11-05 NOTE — Telephone Encounter (Signed)
See note  Copied from CRM [id number]. Topic: General - Other >> Nov 05, 2018 10:11 AM Marylen Ponto wrote: Reason for CRM: Pt returned call to office. Pt stated that someone left her a message but she has some questions and would like a call back to discuss. Pt requests call back. Cb# [phone]

## 2018-11-07 NOTE — Telephone Encounter (Signed)
See note

## 2018-11-08 NOTE — Telephone Encounter (Signed)
Left message to return call to our office.  

## 2018-11-12 NOTE — Telephone Encounter (Signed)
Called pt l/m to call office.  

## 2018-11-18 NOTE — Telephone Encounter (Signed)
Pt called back to speak with Joellen. She was out of the office. Would like a call back by her or Dr. Earlene Plater. CB# [phone]

## 2018-11-18 NOTE — Telephone Encounter (Signed)
See note

## 2018-11-22 NOTE — Telephone Encounter (Signed)
See note

## 2018-11-22 NOTE — Telephone Encounter (Signed)
Patient calling back requesting to speak with Joellen- Patient states it is regarding previous conversations about patients blood pressure. Patient is requesting a call back.   CB# [phone]

## 2018-11-26 NOTE — Telephone Encounter (Signed)
Called patient answered questions. We have made app with our office.

## 2018-12-03 ENCOUNTER — Encounter: Payer: Self-pay | Admitting: Family Medicine

## 2018-12-03 ENCOUNTER — Ambulatory Visit: Payer: Medicare Other | Admitting: Family Medicine

## 2018-12-03 VITALS — BP 142/100 | HR 94 | Wt 151.4 lb

## 2018-12-03 DIAGNOSIS — R0602 Shortness of breath: Secondary | ICD-10-CM | POA: Diagnosis not present

## 2018-12-03 DIAGNOSIS — I10 Essential (primary) hypertension: Secondary | ICD-10-CM

## 2018-12-03 DIAGNOSIS — I429 Cardiomyopathy, unspecified: Secondary | ICD-10-CM

## 2018-12-03 DIAGNOSIS — I493 Ventricular premature depolarization: Secondary | ICD-10-CM | POA: Diagnosis not present

## 2018-12-03 DIAGNOSIS — E7800 Pure hypercholesterolemia, unspecified: Secondary | ICD-10-CM

## 2018-12-03 NOTE — Patient Instructions (Signed)
I want you to continue to keep taking your blood pressure and heart rate regularly.  Start taking the Cozaar at night. Take 1/2 tab today at dinner, 1/2 tab tomorrow at breakfast, then 1 tab tomorrow at dinner to transition. Let's see how your blood pressure looks a few days after that change. I am ordering an ECHO.

## 2018-12-03 NOTE — Progress Notes (Signed)
[name] [name] Judie Petit Hirabayashi is a 85 y.o. female is here for follow up.  History of Present Illness:   Review: taking medications as instructed, no medication side effects noted, no TIAs, no chest pain on exertion, notes new dyspnea on exertion, no swelling of ankles. Smoker: No.   BP Readings from Last 3 Encounters:  12/03/18 (!) 142/100  10/25/18 (!) 156/98  09/26/18 138/76   Lab Results  Component Value Date   CREATININE 1.16 10/25/2018   CREATININE 1.23 (H) 04/09/2018   CREATININE 1.27 (H) 10/02/2017     There are no preventive care reminders to display for this patient.   Depression screen [facility] 2/9 07/26/2018 04/09/2018 03/20/2017  Decreased Interest 0 0 0  Down, Depressed, Hopeless 0 0 0  PHQ - 2 Score 0 0 0  Altered sleeping 0 0 -  Tired, decreased energy 0 0 -  Change in appetite 0 0 -  Feeling bad or failure about yourself  0 0 -  Trouble concentrating 0 0 -  Moving slowly or fidgety/restless 0 0 -  Suicidal thoughts 0 0 -  PHQ-9 Score 0 0 -  Difficult doing work/chores Not difficult at all Not difficult at all -   PMHx, SurgHx, SocialHx, FamHx, Medications, and Allergies were reviewed in the Visit Navigator and updated as appropriate.   Patient Active Problem List   Diagnosis Date Noted  . Impacted cerumen of right ear 10/27/2018  . HLD (hyperlipidemia) 04/07/2018  . DJD (degenerative joint disease) 02/09/2010  . PVCs (premature ventricular contractions) 07/20/2009  . Anxiety and depression 07/16/2009  . Cardiomyopathy (HCC) 04/23/2007  . HTN (hypertension) 04/15/2007  . Osteopenia 04/15/2007   Social History   Tobacco Use  . Smoking status: Never Smoker  . Smokeless tobacco: Never Used  Substance Use Topics  . Alcohol use: No    Alcohol/week: 0.0 standard drinks  . Drug use: No   Current Medications and Allergies:   Current Outpatient Medications:  .  aspirin 81 MG tablet, Take 81 mg by mouth daily., Disp: , Rfl:  .  buPROPion (WELLBUTRIN XL) 150 MG 24  hr tablet, Take 450 mg by mouth every morning. , Disp: , Rfl:  .  carvedilol (COREG) 25 MG tablet, TAKE 1 TABLET (25 MG TOTAL) BY MOUTH 2 (TWO) TIMES DAILY WITH A MEAL., Disp: 180 tablet, Rfl: 1 .  latanoprost (XALATAN) 0.005 % ophthalmic solution, Place 1 drop into both eyes every evening., Disp: , Rfl:  .  losartan (COZAAR) 100 MG tablet, Take 1 tablet (100 mg total) by mouth daily., Disp: 90 tablet, Rfl: 2 .  mirtazapine (REMERON) 30 MG tablet, Take 30 mg by mouth at bedtime. , Disp: , Rfl:  .  Multiple Vitamin (MULTIVITAMIN) tablet, Take 1 tablet by mouth every morning. , Disp: , Rfl:  .  polyethylene glycol (MIRALAX / GLYCOLAX) packet, Take 17 g by mouth 3 (three) times a week. Mon, wed, and fri, Disp: , Rfl:    Allergies  Allergen Reactions  . Shellfish Allergy Anaphylaxis  . Codeine Other (See Comments)    Unknown.  Marland Kitchen Hydrocodone Other (See Comments)    Unknown.  . Adhesive [Tape] Rash   Review of Systems   Pertinent items are noted in the HPI. Otherwise, a complete ROS is negative.  Vitals:   Vitals:   12/03/18 1544  BP: (!) 142/100  Pulse: 94  SpO2: 96%  Weight: 151 lb 6.4 oz (68.7 kg)     Body mass index is 25.19  kg/m.  Physical Exam:   Physical Exam Vitals signs and nursing note reviewed.  HENT:     Head: Normocephalic and atraumatic.  Eyes:     Pupils: Pupils are equal, round, and reactive to light.  Neck:     Musculoskeletal: Normal range of motion and neck supple.  Cardiovascular:     Rate and Rhythm: Normal rate and regular rhythm.     Heart sounds: Normal heart sounds.  Pulmonary:     Effort: Pulmonary effort is normal.  Abdominal:     Palpations: Abdomen is soft.  Skin:    General: Skin is warm.  Psychiatric:        Behavior: Behavior normal.     Results for orders placed or performed in visit on 10/25/18  CBC with Differential/Platelet  Result Value Ref Range   WBC 5.4 4.0 - 10.5 K/uL   RBC 3.99 3.87 - 5.11 Mil/uL   Hemoglobin 13.2 12.0  - 15.0 g/dL   HCT 11.6 57.9 - 03.8 %   MCV 99.6 78.0 - 100.0 fl   MCHC 33.2 30.0 - 36.0 g/dL   RDW 33.3 83.2 - 91.9 %   Platelets 167.0 150.0 - 400.0 K/uL   Neutrophils Relative % 53.3 43.0 - 77.0 %   Lymphocytes Relative 32.5 12.0 - 46.0 %   Monocytes Relative 11.7 3.0 - 12.0 %   Eosinophils Relative 1.6 0.0 - 5.0 %   Basophils Relative 0.9 0.0 - 3.0 %   Neutro Abs 2.9 1.4 - 7.7 K/uL   Lymphs Abs 1.7 0.7 - 4.0 K/uL   Monocytes Absolute 0.6 0.1 - 1.0 K/uL   Eosinophils Absolute 0.1 0.0 - 0.7 K/uL   Basophils Absolute 0.0 0.0 - 0.1 K/uL  Comprehensive metabolic panel  Result Value Ref Range   Sodium 140 135 - 145 mEq/L   Potassium 3.9 3.5 - 5.1 mEq/L   Chloride 104 96 - 112 mEq/L   CO2 26 19 - 32 mEq/L   Glucose, Bld 89 70 - 99 mg/dL   BUN 24 (H) 6 - 23 mg/dL   Creatinine, Ser 1.66 0.40 - 1.20 mg/dL   Total Bilirubin 0.5 0.2 - 1.2 mg/dL   Alkaline Phosphatase 42 39 - 117 U/L   AST 16 0 - 37 U/L   ALT 10 0 - 35 U/L   Total Protein 6.6 6.0 - 8.3 g/dL   Albumin 4.3 3.5 - 5.2 g/dL   Calcium 9.6 8.4 - 06.0 mg/dL   GFR 04.59 (L) >97.74 mL/min  Lipid panel  Result Value Ref Range   Cholesterol 189 0 - 200 mg/dL   Triglycerides 142.3 0.0 - 149.0 mg/dL   HDL 95.32 >02.33 mg/dL   VLDL 43.5 0.0 - 68.6 mg/dL   LDL Cholesterol 168 (H) 0 - 99 mg/dL   Total CHOL/HDL Ratio 3    NonHDL 128.23   TSH  Result Value Ref Range   TSH 1.80 0.35 - 4.50 uIU/mL   EKG: normal sinus rhythm, nonspecific ST and T waves changes.    Assessment and Plan:   [name] [name] was seen today for follow-up.  Diagnoses and all orders for this visit:  SOB (shortness of breath) on exertion -     EKG 12-Lead -     ECHOCARDIOGRAM COMPLETE; Future  PVCs (premature ventricular contractions)  Cardiomyopathy, unspecified type (HCC)  Essential hypertension Comments: See AVS.   Pure hypercholesterolemia   . Orders and follow up as documented in EpicCare, reviewed diet, exercise and weight control,  cardiovascular risk and specific lipid/LDL goals reviewed, reviewed medications and side effects in detail.  . Reviewed expectations re: course of current medical issues. . Outlined signs and symptoms indicating need for more acute intervention. . Patient verbalized understanding and all questions were answered. . Patient received an After Visit Summary.  Helane Rima, DO [place] 12/12/2018

## 2018-12-06 ENCOUNTER — Telehealth: Payer: Self-pay | Admitting: Family Medicine

## 2018-12-06 NOTE — Telephone Encounter (Signed)
Spoke to patient and she had concerns about her new dosing for her Cozaar. Patient was told per Dr.Wallace at last OV to " take 1/2 tablet at dinner, 1/2 the following a.m and a full tab the following night. Then pt is to start 1 tab daily at dinner." this change was made per pt request to start taking Rx at night only instead of morning. Patinet question was " should I take 1 tab in the am and 1 tab at dinner?" patient was told to d/c the am dose since she was trying to only do night. Pt verbalized understanding.

## 2018-12-06 NOTE — Telephone Encounter (Signed)
See note  Copied from CRM [id number]. Topic: General - Other >> Dec 06, 2018 11:24 AM Angela Nevin wrote: Reason for CRM: Patient called requesting to speak with [name] [name]- Patient would not disclose any information other than it was about office visit on 01/21 and has additional questions (when asked patient stated [name] knows what it is about). Please advise.

## 2018-12-09 ENCOUNTER — Encounter: Payer: Self-pay | Admitting: Family Medicine

## 2018-12-15 ENCOUNTER — Other Ambulatory Visit: Payer: Self-pay | Admitting: Family Medicine

## 2018-12-17 ENCOUNTER — Ambulatory Visit (HOSPITAL_COMMUNITY): Payer: Medicare Other | Attending: Cardiovascular Disease

## 2018-12-17 DIAGNOSIS — R0602 Shortness of breath: Secondary | ICD-10-CM | POA: Diagnosis not present

## 2018-12-23 ENCOUNTER — Telehealth: Payer: Self-pay | Admitting: Cardiovascular Disease

## 2018-12-23 ENCOUNTER — Encounter: Payer: Self-pay | Admitting: Family Medicine

## 2018-12-23 NOTE — Telephone Encounter (Signed)
New Message   Pt is returning phone call for Pam  Please call

## 2018-12-23 NOTE — Telephone Encounter (Signed)
Patient call back. Went over echo results and Dr. Eden Emms and Dr. Philis Pique advisement. Patient having SOB with activity. Scheduled patient with Dr. Eden Emms on 12/25/18.

## 2018-12-24 NOTE — Progress Notes (Signed)
Patient ID: [name] [name], female   DOB: December 09, 1926, 85 y.o.   MRN: [id number]  85 y.o. with history of presumed long standing non ischemic DCM. Normal cath back in 2007. Has been functional class one for age. Echo  03/15/17 EF  35% with mild MR and estimated PA systolic pressure of 43 mmHg. She has history of asymptomatic PVC;s. As well as restless legs, HTN, anemia and anxiety / depression   No edema, PND, orthopnea.  Lives with daughter Still driving.  No chest pain palpitations or syncope  She is swimming at [employer] to finish a memoir about the town of [place]   Having some exertional dyspnea  TTE reviewed from 12/17/18 EF 25-30% grade 2 diastolic estimated PA pressure 63 mmhg midl to moderate MR  Having more fatigue and exertional dyspnea especially going up stairs   ROS: Denies fever, malais, weight loss, blurry vision, decreased visual acuity, cough, sputum, SOB, hemoptysis, pleuritic pain, palpitaitons, heartburn, abdominal pain, melena, lower extremity edema, claudication, or rash.  All other systems reviewed and negative  General: BP (!) 154/90   Pulse 79   Ht 5\' 5"  (1.651 m)   Wt 149 lb 1.9 oz (67.6 kg)   LMP  (LMP Unknown)   SpO2 91%   BMI 24.81 kg/m  Affect appropriate Healthy:  appears stated age HEENT: normal Neck supple with no adenopathy JVP normal no bruits no thyromegaly Lungs clear with no wheezing and good diaphragmatic motion Heart:  S1/S2 no murmur, no rub, gallop or click PMI enlarged  Abdomen: benighn, BS positve, no tenderness, no AAA no bruit.  No HSM or HJR Distal pulses intact with no bruits No edema Neuro non-focal Skin warm and dry No muscular weakness     Current Outpatient Medications  Medication Sig Dispense Refill  . aspirin 81 MG tablet Take 81 mg by mouth daily.    Marland Kitchen buPROPion (WELLBUTRIN XL) 150 MG 24 hr tablet Take 450 mg by mouth every morning.     . carvedilol (COREG) 25 MG tablet TAKE 1 TABLET (25 MG TOTAL) BY MOUTH  2 (TWO) TIMES DAILY WITH A MEAL. 180 tablet 1  . latanoprost (XALATAN) 0.005 % ophthalmic solution Place 1 drop into both eyes every evening.    . mirtazapine (REMERON) 30 MG tablet Take 30 mg by mouth at bedtime.     . Multiple Vitamin (MULTIVITAMIN) tablet Take 1 tablet by mouth every morning.     . polyethylene glycol (MIRALAX / GLYCOLAX) packet Take 17 g by mouth 3 (three) times a week. Mon, wed, and fri    . furosemide (LASIX) 20 MG tablet Take 0.5 tablets (10 mg total) by mouth daily. 45 tablet 3  . sacubitril-valsartan (ENTRESTO) 24-26 MG Take 1 tablet by mouth 2 (two) times daily. 60 tablet 11   No current facility-administered medications for this visit.     Allergies  Shellfish allergy; Codeine; Hydrocodone; and Adhesive [tape]  Electrocardiogram:  09/26/18  SR rate 81 LVH with strain no changes  Assessment and Plan HTN: Well controlled.  Continue current medications and low sodium Dash type diet.   DCM: More dyspnea with TTE suggesting lower EF, elevated filling pressures and elevated PA pressures start lasix and change losartan to entresto f/u labs and escalation of dose with Pharm D in 3 weeks  PVC" asymptomatic quiescent continue beta blocker  PFO: not noted on distant  TTE not last few   No TIA symptoms no need for f/u at this time  Anxiety/Depression continue welburin    Charlton Haws

## 2018-12-25 ENCOUNTER — Ambulatory Visit: Payer: Medicare Other | Admitting: Cardiovascular Disease

## 2018-12-25 ENCOUNTER — Encounter: Payer: Self-pay | Admitting: Cardiovascular Disease

## 2018-12-25 VITALS — BP 154/90 | HR 79 | Ht 65.0 in | Wt 149.1 lb

## 2018-12-25 DIAGNOSIS — I420 Dilated cardiomyopathy: Secondary | ICD-10-CM | POA: Diagnosis not present

## 2018-12-25 DIAGNOSIS — I10 Essential (primary) hypertension: Secondary | ICD-10-CM | POA: Diagnosis not present

## 2018-12-25 MED ORDER — FUROSEMIDE 20 MG PO TABS: 10.0000 mg | ORAL_TABLET | Freq: Every day | ORAL | 3 refills | Status: DC

## 2018-12-25 MED ORDER — SACUBITRIL-VALSARTAN 24-26 MG PO TABS: 1.0000 | ORAL_TABLET | Freq: Two times a day (BID) | ORAL | 11 refills | Status: DC

## 2018-12-25 NOTE — Patient Instructions (Addendum)
Medication Instructions:  Your physician has recommended you make the following change in your medication:  1-STOP losartan  2-START Entresto 24/26 mg by mouth twice daily 3-START Lasix 10 mg by mouth daily  If you need a refill on your cardiac medications before your next appointment, please call your pharmacy.   Lab work: Your physician recommends that you have lab work today- BMET  If you have labs (blood work) drawn today and your tests are completely normal, you will receive your results only by: Marland Kitchen MyChart Message (if you have MyChart) OR . A paper copy in the mail If you have any lab test that is abnormal or we need to change your treatment, we will call you to review the results.  Testing/Procedures: None ordered today.  Follow-Up: At [facility], you and your health needs are our priority.  As part of our continuing mission to provide you with exceptional heart care, we have created designated Provider Care Teams.  These Care Teams include your primary Cardiologist (physician) and Advanced Practice Providers (APPs -  Physician Assistants and Nurse Practitioners) who all work together to provide you with the care you need, when you need it.  Your physician recommends that you schedule a follow-up appointment in: 3 weeks with Pharmacy to titrate [facility].  You will need a follow up appointment in 3 months.  You may see Charlton Haws, MD or one of the following Advanced Practice Providers on your designated Care Team:   Norma Fredrickson, NP Nada Boozer, NP . Georgie Chard, NP

## 2018-12-26 LAB — BASIC METABOLIC PANEL
BUN/Creatinine Ratio: 22 (ref 12–28)
BUN: 25 mg/dL (ref 8–27)
CO2: 23 mmol/L (ref 20–29)
Calcium: 9.5 mg/dL (ref 8.7–10.3)
Chloride: 105 mmol/L (ref 96–106)
Creatinine, Ser: 1.13 mg/dL — ABNORMAL HIGH (ref 0.57–1.00)
GFR calc Af Amer: 50 mL/min/{1.73_m2} — ABNORMAL LOW (ref >59)
GFR calc non Af Amer: 43 mL/min/{1.73_m2} — ABNORMAL LOW (ref >59)
Glucose: 99 mg/dL (ref 65–99)
Potassium: 4.2 mmol/L (ref 3.5–5.2)
SODIUM: 141 mmol/L (ref 134–144)

## 2018-12-31 ENCOUNTER — Ambulatory Visit: Payer: Medicare Other | Admitting: Sports Medicine

## 2018-12-31 ENCOUNTER — Encounter: Payer: Self-pay | Admitting: Sports Medicine

## 2018-12-31 ENCOUNTER — Ambulatory Visit: Payer: Self-pay

## 2018-12-31 VITALS — BP 152/100 | HR 82 | Ht 65.0 in | Wt 149.2 lb

## 2018-12-31 DIAGNOSIS — M1712 Unilateral primary osteoarthritis, left knee: Secondary | ICD-10-CM | POA: Diagnosis not present

## 2018-12-31 DIAGNOSIS — M25552 Pain in left hip: Secondary | ICD-10-CM | POA: Diagnosis not present

## 2018-12-31 NOTE — Progress Notes (Signed)
[name] [name]. [name] at [facility] [phone]  [name] [name] - 84 y.o. female MRN [id number]  Date of birth: May 03, 1927  Visit Date: 02/18/2020February 18, 2020  PCP: Helane Rima, DO   Referred by: Helane Rima, DO  SUBJECTIVE:   Chief Complaint  Patient presents with  . Initial Assessment    L hip pain    HPI: Patient presents with several weeks of worsening left posterior lateral hip pain.  The pain is localized to the greater trochanteric region.  She is having pain at night while laying directly on the side.  She does have left-sided knee pain that is been present for quite some time but this is overall improved compared to when I saw her in 2018.  She denies any known injury.  The pain is moderate during the day and severe at night especially while laying on the side.  She has noticed some swelling in her left leg greater than her right leg and is being followed by cardiology with recent medication changes but this does not seem to affect her underlying leg pain.  She is taking Tylenol without significant relief.  She is having pain that is worse with weightbearing.  She works out fairly consistently at [employer] on an almost 3 day/week basis.  She has had to discontinue this however due to the hip pain.  REVIEW OF SYSTEMS: Per HPI  HISTORY:  Prior history reviewed and updated per electronic medical record.  Patient Active Problem List   Diagnosis Date Noted  . Impacted cerumen of right ear 10/27/2018  . HLD (hyperlipidemia) 04/07/2018  . DJD (degenerative joint disease) 02/09/2010  . PVCs (premature ventricular contractions) 07/20/2009  . Anxiety and depression 07/16/2009    Followed by Dr Donell Beers.    . Cardiomyopathy (HCC) 04/23/2007  . HTN (hypertension) 04/15/2007  . Osteopenia 04/15/2007   Social History   Occupational History  . Occupation: Wellsite geologist: RETIRED  Tobacco Use    . Smoking status: Never Smoker  . Smokeless tobacco: Never Used  Substance and Sexual Activity  . Alcohol use: No    Alcohol/week: 0.0 standard drinks  . Drug use: No  . Sexual activity: Not Currently   Social History   Social History Narrative   Widow, moved to [facility] 2007. Has a daughter Vikki Ports and a G-d in [place]; lost her son-in-law, moved w/ daughter 2014 due to depression.    OBJECTIVE:  VS:  HT:5\' 5"  (165.1 cm)   WT:149 lb 3.2 oz (67.7 kg)  BMI:24.83    BP:(!) 152/100  HR:82bpm  TEMP: ( )  RESP:99 %   PHYSICAL EXAM: Adult female. No acute distress.  Alert and appropriate. Her left leg is overall well aligned with a small amount of swelling on the left compared to the right that is trace.  She has increased varicosities on the left compared to the right this is mild.  She does have generalized knee stiffness on the left knee compared to the right knee but has no significant pain with any type of flexion or extension.  She is limited by 5 degrees on the left.  Her knee is ligamentously stable to varus and valgus strain.  Anterior and posterior drawer normal. She has a negative Stinchfield test and mild pain with terminal Pearlean Brownie testing localizing to the greater trochanter.  This focal pain is directly over the greater trochanter with direct palpation.  Hip abduction strength  is slightly weak.   ASSESSMENT:   1. Left hip pain   2. Primary osteoarthritis of left knee   3. Greater trochanteric pain syndrome of left lower extremity     PROCEDURES:  US Guided Injection per procedure note   PROCEDURE NOTE: THERAPEUTIC EXERCISES (37858)  Discussed the foundation of treatment for this condition is physical therapy and/or daily (5-6 days/week) therapeutic exercises, focusing on core strengthening, coordination, neuromuscular control/reeducation. 15 minutes spent for Therapeutic exercises as below and as referenced in the AVS. This included exercises focusing on stretching,  strengthening, with significant focus on eccentric aspects.  Proper technique shown and discussed handout in great detail with ATC. All questions were discussed and answered.  Long term goals include an improvement in range of motion, strength, endurance as well as avoiding reinjury. Frequency of visits is one time as determined during today's office visit. Frequency of exercises to be performed is as per handout. EXERCISES REVIEWED: Hip ABduction strengthening with focus on Glute Medius Recruitment Piriformis stretching     PLAN:  Pertinent additional documentation may be included in corresponding procedure notes, imaging studies, problem based documentation and patient instructions.  No problem-specific Assessment & Plan notes found for this encounter.   Symptoms are consistent with greater trochanteric pain syndrome and injection provided today with home therapeutic exercises emphasized.  Home Therapeutic exercises prescribed today per procedure note.  Activity modifications and the importance of avoiding exacerbating activities (limiting pain to no more than a 4 / 10 during or following activity) recommended and discussed.  Discussed red flag symptoms that warrant earlier emergent evaluation and patient voices understanding.   No orders of the defined types were placed in this encounter.  Lab Orders  No laboratory test(s) ordered today   Imaging Orders     [place] MSK POCT ULTRASOUND Referral Orders  No referral(s) requested today   Return if symptoms worsen or fail to improve.  If any lack of improvement can consider intra-articular injection into the left knee.         Andrena Mews, DO    [place] [facility] Physician

## 2018-12-31 NOTE — Procedures (Signed)
PROCEDURE NOTE:  Ultrasound Guided: Injection: Left hip, greater trochanteric injection Images were obtained and interpreted by myself, Gaspar Bidding, DO  Images have been saved and stored to PACS system. Images obtained on: GE S7 Ultrasound machine    ULTRASOUND FINDINGS:  Hypoechoic change with fragmentation of the hip abductor insertion.  DESCRIPTION OF PROCEDURE:  The patient's clinical condition is marked by substantial pain and/or significant functional disability. Other conservative therapy has not provided relief, is contraindicated, or not appropriate. There is a reasonable likelihood that injection will significantly improve the patient's pain and/or functional impairment.   After discussing the risks, benefits and expected outcomes of the injection and all questions were reviewed and answered, the patient wished to undergo the above named procedure.  Verbal consent was obtained.  The ultrasound was used to identify the target structure and adjacent neurovascular structures. The skin was then prepped in sterile fashion and the target structure was injected under direct visualization using sterile technique as below:  Single injection performed as below: PREP: Alcohol and Ethel Chloride APPROACH:direct, single injection, 21g 2 in. INJECTATE: 3 cc 0.5% Marcaine and 1 cc 40mg /mL DepoMedrol ASPIRATE: None DRESSING: Band-Aid  Post procedural instructions including recommending icing and warning signs for infection were reviewed.    This procedure was well tolerated and there were no complications.   IMPRESSION: Succesful Ultrasound Guided: Injection

## 2018-12-31 NOTE — Patient Instructions (Addendum)

## 2019-01-14 ENCOUNTER — Ambulatory Visit (INDEPENDENT_AMBULATORY_CARE_PROVIDER_SITE_OTHER): Payer: Medicare Other | Admitting: Pharmacist

## 2019-01-14 VITALS — BP 138/76 | HR 74

## 2019-01-14 DIAGNOSIS — I429 Cardiomyopathy, unspecified: Secondary | ICD-10-CM

## 2019-01-14 LAB — BASIC METABOLIC PANEL
BUN/Creatinine Ratio: 16 (ref 12–28)
BUN: 20 mg/dL (ref 8–27)
CO2: 21 mmol/L (ref 20–29)
Calcium: 9.6 mg/dL (ref 8.7–10.3)
Chloride: 106 mmol/L (ref 96–106)
Creatinine, Ser: 1.22 mg/dL — ABNORMAL HIGH (ref 0.57–1.00)
GFR, EST AFRICAN AMERICAN: 45 mL/min/{1.73_m2} — AB (ref >59)
GFR, EST NON AFRICAN AMERICAN: 39 mL/min/{1.73_m2} — AB (ref >59)
Glucose: 92 mg/dL (ref 65–99)
Potassium: 4.1 mmol/L (ref 3.5–5.2)
Sodium: 142 mmol/L (ref 134–144)

## 2019-01-14 LAB — HM MAMMOGRAPHY

## 2019-01-14 MED ORDER — SACUBITRIL-VALSARTAN 49-51 MG PO TABS: 1.0000 | ORAL_TABLET | Freq: Two times a day (BID) | ORAL | 11 refills | Status: DC

## 2019-01-14 NOTE — Patient Instructions (Signed)
It was a pleasure to meet you this morning.  I will call you with your lab results tomorrow. As long as everything looks good, we will increase your Entresto to 49/51mg  twice a day.  Call us at [phone] with any questions or concerns.

## 2019-01-14 NOTE — Progress Notes (Signed)
Patient ID: JAZZEL WOLFGRAMM                 DOB: Jun 15, 1927                      MRN: [id number]     HPI: [name] [name] is a 85 y.o. female referred by Dr. Eden Emms to pharmacy clinic for CHF medication titration. PMH is significant for long standing non ischemic DCM, EF 25-30%, PVC, restless lef, HTN, anemia, anxiety and depression. At last visit with Dr. Eden Emms on 12/31/2018 patient was converted from losartan to [facility].  Patient presents today for possible dose escalation of Entresto. She reports feeling well. Denies any dizziness, lightheadedness or swelling. Patient will go to lab after visit for BMP to check on scr and K.  Current CHF meds: carvedilol 25mg  twice a day, furosemide 10mg  daily, Entresto 24-26mg  twice a day  Social History: no tobacco, no ETOH  Wt Readings from Last 3 Encounters:  12/31/18 149 lb 3.2 oz (67.7 kg)  12/25/18 149 lb 1.9 oz (67.6 kg)  12/03/18 151 lb 6.4 oz (68.7 kg)   BP Readings from Last 3 Encounters:  12/31/18 (!) 152/100  12/25/18 (!) 154/90  12/03/18 (!) 142/100   Pulse Readings from Last 3 Encounters:  12/31/18 82  12/25/18 79  12/03/18 94    Renal function: Estimated Creatinine Clearance: 30.4 mL/min (A) (by C-G formula based on SCr of 1.13 mg/dL (H)).  Past Medical History:  Diagnosis Date  . Anemia   . Anxiety and depression 07/16/2009  . ASD (atrial septal defect) per bubble study, 2004   . Cardiomyopathy, normal cath 2007, normal EF per ECHO 2014   . Hx of colonic polyps   . Hypertension   . Osteoarthritis   . Osteopenia   . Podagra 04/29/2014  . RLS (restless legs syndrome) 07/10/2009    Current Outpatient Medications on File Prior to Visit  Medication Sig Dispense Refill  . aspirin 81 MG tablet Take 81 mg by mouth daily.    Marland Kitchen buPROPion (WELLBUTRIN XL) 150 MG 24 hr tablet Take 450 mg by mouth every morning.     . carvedilol (COREG) 25 MG tablet TAKE 1 TABLET (25 MG TOTAL) BY MOUTH 2 (TWO) TIMES DAILY WITH A MEAL.  180 tablet 1  . furosemide (LASIX) 20 MG tablet Take 0.5 tablets (10 mg total) by mouth daily. 45 tablet 3  . latanoprost (XALATAN) 0.005 % ophthalmic solution Place 1 drop into both eyes every evening.    . mirtazapine (REMERON) 30 MG tablet Take 30 mg by mouth at bedtime.     . Multiple Vitamin (MULTIVITAMIN) tablet Take 1 tablet by mouth every morning.     . polyethylene glycol (MIRALAX / GLYCOLAX) packet Take 17 g by mouth 3 (three) times a week. Mon, wed, and fri    . sacubitril-valsartan (ENTRESTO) 24-26 MG Take 1 tablet by mouth 2 (two) times daily. 60 tablet 11   No current facility-administered medications on file prior to visit.     Allergies  Allergen Reactions  . Shellfish Allergy Anaphylaxis  . Codeine Other (See Comments)    Unknown.  Marland Kitchen Hydrocodone Other (See Comments)    Unknown.  . Adhesive [Tape] Rash    There were no vitals taken for this visit.   Assessment/Plan:  1. CHF medication titration - Blood pressure allows room for titration. Will increase Entresto to 49/51mg  twice a day pending BMP results. Follow up in clinic  in 2 weeks.   Thank you  Olene Floss, Pharm.D, BCPS [facility]  [address] [place], [place] [address]  Phone: [phone]; Fax: [phone]

## 2019-01-15 ENCOUNTER — Telehealth: Payer: Self-pay | Admitting: Family Medicine

## 2019-01-15 ENCOUNTER — Telehealth: Payer: Self-pay | Admitting: Pharmacist

## 2019-01-15 NOTE — Telephone Encounter (Signed)
Patient called and informed her BMP looks good. She may increase Entresto to 49/51mg  BID as discussed yesterday in the office. Rx sent to [facility]. Patient appreciative of the call.

## 2019-01-15 NOTE — Telephone Encounter (Signed)
See note  Copied from CRM [id number]. Topic: General - Other >> Jan 15, 2019 10:03 AM Arlyss Gandy, NT wrote: Reason for CRM: Pt requesting a call from Dr. Philis Pique nurse in regards to her cardiology appt she had yesterday.

## 2019-01-15 NOTE — Telephone Encounter (Signed)
Patient is calling back to request a call from Dr. Earlene Plater nurse.  Please call patient back at [phone]

## 2019-01-15 NOTE — Telephone Encounter (Signed)
Called patient she did not mean to call our office it was an error.

## 2019-01-15 NOTE — Telephone Encounter (Signed)
See note

## 2019-01-15 NOTE — Telephone Encounter (Signed)
Copied from CRM [id number]. Topic: General - Other >> Jan 15, 2019 10:03 AM Arlyss Gandy, NT wrote: Reason for CRM: Pt requesting a call from Dr. Philis Pique nurse in regards to her cardiology appt she had yesterday. >> Jan 15, 2019  1:21 PM Trula Slade wrote: Patient stated she has talked to her cardiologist so there is no need for a return call from the nurse.

## 2019-01-20 NOTE — Progress Notes (Signed)
[name] [name] is a 85 y.o. female is here for follow up.  Assessment and Plan:   Anxiety and depression Well controlled.  Followed by Dr. Donell Beers.  Will continue Wellbutrin for the foreseeable future.  HLD (hyperlipidemia) Lab Results  Component Value Date   CHOL 189 10/25/2018   HDL 61.20 10/25/2018   LDLCALC 108 (H) 10/25/2018   LDLDIRECT 116.5 11/08/2012   TRIG 102.0 10/25/2018   CHOLHDL 3 10/25/2018   Lab Results  Component Value Date   ALT 10 10/25/2018   AST 16 10/25/2018   ALKPHOS 42 10/25/2018   BILITOT 0.5 10/25/2018   Not on statin. Need to clarify and document history.  Insulin resistance Lab Results  Component Value Date   HGBA1C 6.0 03/20/2017   The patient is asked to make an attempt to improve diet and exercise patterns to aid in medical management of this problem.   History of exercise intolerance Improving with Entresto. Will go ahead and obtain another BMP today.   HTN (hypertension) BP Readings from Last 3 Encounters:  01/21/19 136/86  01/14/19 138/76  12/31/18 (!) 152/100   Wt Readings from Last 3 Encounters:  01/21/19 147 lb 9.6 oz (67 kg)  12/31/18 149 lb 3.2 oz (67.7 kg)  12/25/18 149 lb 1.9 oz (67.6 kg)   Well controlled.  No signs of complications, medication side effects, or red flags.  Continue current regimen.    Orders Placed This Encounter  Procedures  . Basic metabolic panel   Subjective:   HPI: See Assessment and Plan section for Problem Based Charting of issues discussed today.   Health Maintenance:  There are no preventive care reminders to display for this patient. Depression screen [facility] 2/9 01/21/2019 07/26/2018 04/09/2018  Decreased Interest 0 0 0  Down, Depressed, Hopeless 0 0 0  PHQ - 2 Score 0 0 0  Altered sleeping 0 0 0  Tired, decreased energy 0 0 0  Change in appetite 0 0 0  Feeling bad or failure about yourself  0 0 0  Trouble concentrating 0 0 0  Moving slowly or fidgety/restless 0 0 0  Suicidal  thoughts 0 0 0  PHQ-9 Score 0 0 0  Difficult doing work/chores Not difficult at all Not difficult at all Not difficult at all   PMHx, SurgHx, SocialHx, FamHx, Medications, and Allergies were reviewed in the Visit Navigator and updated as appropriate.   Patient Active Problem List   Diagnosis Date Noted  . Insulin resistance 01/26/2019  . History of exercise intolerance 01/26/2019  . HLD (hyperlipidemia) 04/07/2018  . DJD (degenerative joint disease) 02/09/2010  . PVCs (premature ventricular contractions) 07/20/2009  . Anxiety and depression 07/16/2009  . Dilated cardiomyopathy (HCC) 04/23/2007  . HTN (hypertension) 04/15/2007  . Osteopenia 04/15/2007   Social History   Tobacco Use  . Smoking status: Never Smoker  . Smokeless tobacco: Never Used  Substance Use Topics  . Alcohol use: No    Alcohol/week: 0.0 standard drinks  . Drug use: No   Current Medications and Allergies:   Current Outpatient Medications:  .  aspirin 81 MG tablet, Take 81 mg by mouth daily., Disp: , Rfl:  .  buPROPion (WELLBUTRIN XL) 150 MG 24 hr tablet, Take 450 mg by mouth every morning. , Disp: , Rfl:  .  carvedilol (COREG) 25 MG tablet, TAKE 1 TABLET (25 MG TOTAL) BY MOUTH 2 (TWO) TIMES DAILY WITH A MEAL., Disp: 180 tablet, Rfl: 1 .  furosemide (LASIX) 20 MG  tablet, Take 0.5 tablets (10 mg total) by mouth daily., Disp: 45 tablet, Rfl: 3 .  latanoprost (XALATAN) 0.005 % ophthalmic solution, Place 1 drop into both eyes every evening., Disp: , Rfl:  .  mirtazapine (REMERON) 30 MG tablet, Take 30 mg by mouth at bedtime. , Disp: , Rfl:  .  Multiple Vitamin (MULTIVITAMIN) tablet, Take 1 tablet by mouth every morning. , Disp: , Rfl:  .  polyethylene glycol (MIRALAX / GLYCOLAX) packet, Take 17 g by mouth 3 (three) times a week. Mon, wed, and fri, Disp: , Rfl:  .  sacubitril-valsartan (ENTRESTO) 49-51 MG, Take 1 tablet by mouth 2 (two) times daily., Disp: 60 tablet, Rfl: 11   Allergies  Allergen Reactions  .  Shellfish Allergy Anaphylaxis  . Codeine Other (See Comments)    Unknown.  Marland Kitchen Hydrocodone Other (See Comments)    Unknown.  . Adhesive [Tape] Rash   Review of Systems   Pertinent items are noted in the HPI. Otherwise, ROS is negative.  Vitals:   Vitals:   01/21/19 1253 01/21/19 1309  BP: (!) 150/88 136/86  Pulse: 79   SpO2: 100%   Weight: 147 lb 9.6 oz (67 kg)   Height: 5\' 5"  (1.651 m)      Body mass index is 24.56 kg/m. Physical Exam:   General: Cooperative, alert and oriented, well developed, well nourished, in no acute distress. HEENT: Pupils equal round reactive light and extraocular movements intact. Conjunctivae and lids unremarkable. No pallor or cyanosis, dentition good. Neck: No thyromegaly.  Cardiovascular: Regular rhythm. No murmurs appreciated.  Lungs: Normal work of breathing. Clear bilaterally without rales, rhonchi, or wheezing.  Abdomen: Soft, nontender, no masses. Normal bowel sounds. Extremities: No clubbing, cyanosis, erythema. No edema.  Skin: Warm and dry. Neurologic: No focal deficits.  Psychiatric: Normal affect and thought content.   . Reviewed expectations re: course of current medical issues. . Discussed self-management of symptoms. . Outlined signs and symptoms indicating need for more acute intervention. . Patient verbalized understanding and all questions were answered. Marland Kitchen Health Maintenance issues including appropriate healthy diet, exercise, and smoking avoidance were discussed with patient. . See orders for this visit as documented in the electronic medical record. . Patient received an After Visit Summary.  Helane Rima, DO [place] 01/26/2019

## 2019-01-21 ENCOUNTER — Encounter: Payer: Self-pay | Admitting: Family Medicine

## 2019-01-21 ENCOUNTER — Ambulatory Visit: Payer: Medicare Other | Admitting: Family Medicine

## 2019-01-21 ENCOUNTER — Encounter: Payer: Self-pay | Admitting: Physician Assistant

## 2019-01-21 ENCOUNTER — Telehealth: Payer: Self-pay | Admitting: Sports Medicine

## 2019-01-21 VITALS — BP 136/86 | HR 79 | Ht 65.0 in | Wt 147.6 lb

## 2019-01-21 DIAGNOSIS — I420 Dilated cardiomyopathy: Secondary | ICD-10-CM | POA: Diagnosis not present

## 2019-01-21 DIAGNOSIS — E88819 Insulin resistance, unspecified: Secondary | ICD-10-CM

## 2019-01-21 DIAGNOSIS — F419 Anxiety disorder, unspecified: Secondary | ICD-10-CM | POA: Diagnosis not present

## 2019-01-21 DIAGNOSIS — I10 Essential (primary) hypertension: Secondary | ICD-10-CM

## 2019-01-21 DIAGNOSIS — E8881 Metabolic syndrome: Secondary | ICD-10-CM | POA: Diagnosis not present

## 2019-01-21 DIAGNOSIS — F329 Major depressive disorder, single episode, unspecified: Secondary | ICD-10-CM

## 2019-01-21 DIAGNOSIS — F32A Depression, unspecified: Secondary | ICD-10-CM

## 2019-01-21 DIAGNOSIS — Z87898 Personal history of other specified conditions: Secondary | ICD-10-CM

## 2019-01-21 DIAGNOSIS — E7800 Pure hypercholesterolemia, unspecified: Secondary | ICD-10-CM

## 2019-01-21 LAB — BASIC METABOLIC PANEL
BUN: 34 mg/dL — ABNORMAL HIGH (ref 6–23)
CO2: 29 mEq/L (ref 19–32)
Calcium: 9.6 mg/dL (ref 8.4–10.5)
Chloride: 105 mEq/L (ref 96–112)
Creatinine, Ser: 1.23 mg/dL — ABNORMAL HIGH (ref 0.40–1.20)
GFR: 49.68 mL/min — ABNORMAL LOW (ref >60.00)
Glucose, Bld: 93 mg/dL (ref 70–99)
Potassium: 4.2 mEq/L (ref 3.5–5.1)
Sodium: 141 mEq/L (ref 135–145)

## 2019-01-21 NOTE — Telephone Encounter (Signed)
Called pt and scheduled her for Thursday, January 23, 2019 at 1:40 pm

## 2019-01-21 NOTE — Telephone Encounter (Signed)
Copied from CRM [id number]. Topic: General - Other >> Jan 21, 2019  9:40 AM Elliot Gault wrote: Relation to pt: self  Call back number:[phone]   Reason for call:  Patient scheduled appointment today at 1pm with Dr. Earlene Plater and would like to be work in with Dr. Berline Chough afterwards for right leg pain (patient seeking an injection). Please advise prior to 1pm appointment if patient can be work in with specialist

## 2019-01-23 ENCOUNTER — Ambulatory Visit: Payer: Medicare Other | Admitting: Sports Medicine

## 2019-01-26 ENCOUNTER — Encounter: Payer: Self-pay | Admitting: Family Medicine

## 2019-01-26 DIAGNOSIS — Z87898 Personal history of other specified conditions: Secondary | ICD-10-CM | POA: Insufficient documentation

## 2019-01-26 DIAGNOSIS — R7303 Prediabetes: Secondary | ICD-10-CM | POA: Insufficient documentation

## 2019-01-26 DIAGNOSIS — E8881 Metabolic syndrome: Secondary | ICD-10-CM | POA: Insufficient documentation

## 2019-01-26 NOTE — Assessment & Plan Note (Signed)
Lab Results  Component Value Date   HGBA1C 6.0 03/20/2017   The patient is asked to make an attempt to improve diet and exercise patterns to aid in medical management of this problem.

## 2019-01-26 NOTE — Assessment & Plan Note (Signed)
Lab Results  Component Value Date   CHOL 189 10/25/2018   HDL 61.20 10/25/2018   LDLCALC 108 (H) 10/25/2018   LDLDIRECT 116.5 11/08/2012   TRIG 102.0 10/25/2018   CHOLHDL 3 10/25/2018   Lab Results  Component Value Date   ALT 10 10/25/2018   AST 16 10/25/2018   ALKPHOS 42 10/25/2018   BILITOT 0.5 10/25/2018   Not on statin. Need to clarify and document history.

## 2019-01-26 NOTE — Assessment & Plan Note (Signed)
BP Readings from Last 3 Encounters:  01/21/19 136/86  01/14/19 138/76  12/31/18 (!) 152/100   Wt Readings from Last 3 Encounters:  01/21/19 147 lb 9.6 oz (67 kg)  12/31/18 149 lb 3.2 oz (67.7 kg)  12/25/18 149 lb 1.9 oz (67.6 kg)   Well controlled.  No signs of complications, medication side effects, or red flags.  Continue current regimen.

## 2019-01-26 NOTE — Assessment & Plan Note (Signed)
Well controlled.  Followed by Dr. Donell Beers.  Will continue Wellbutrin for the foreseeable future.

## 2019-01-26 NOTE — Assessment & Plan Note (Signed)
Improving with Entresto. Will go ahead and obtain another BMP today.

## 2019-01-27 ENCOUNTER — Telehealth: Payer: Self-pay | Admitting: Cardiovascular Disease

## 2019-01-27 NOTE — Telephone Encounter (Signed)
New Message   Patient has appointment coming on 3/19 and wants to know if she should still come in to appointment.  Advised patient we are still seeing patients so it's up to her, but she would still like to speak to pharmacist.

## 2019-01-27 NOTE — Telephone Encounter (Signed)
Please have pt appt moved ~3 weeks out. She has already had labs since Entresto dose increase.

## 2019-01-28 ENCOUNTER — Ambulatory Visit: Payer: Medicare Other

## 2019-01-30 ENCOUNTER — Ambulatory Visit: Payer: Medicare Other

## 2019-02-20 ENCOUNTER — Telehealth: Payer: Self-pay | Admitting: Physical Therapy

## 2019-02-20 NOTE — Telephone Encounter (Signed)
Also I can send in some tramadol if she would like

## 2019-02-20 NOTE — Telephone Encounter (Signed)
Returned pt's call and informed her of Dr. Janeece Riggers recommendations.  Pt states that she would like a Tramadol rx sent to [facility] at [address].

## 2019-02-20 NOTE — Telephone Encounter (Signed)
Ensure she is doing her Exercises and stretching. Use ICE for 10-15 minutes at a time I would prefer her not leaving her house due to COVID however if debilitated I can see her in clinic if required.

## 2019-02-20 NOTE — Telephone Encounter (Signed)
Copied from CRM [id number]. Topic: General - Other >> Feb 20, 2019 11:33 AM [name] wrote: Reason for CRM: Patient called to say that she is having an extreme amount of pain in her left hip. Stated that on 12/31/2018 she was given an injection by Dr Berline Chough but now its back to hurting again. Patient is asking what to do because she does not want to come in to the office in fear of the coronavirus. Please advise call patient at Ph# [phone]

## 2019-02-20 NOTE — Telephone Encounter (Signed)
Called pt and informed her of Dr. Janeece Riggers recommendations.  Pt states that she would like a Tramadol rx sent to [facility] at [address].

## 2019-02-22 MED ORDER — TRAMADOL HCL 50 MG PO TABS: 50.0000 mg | ORAL_TABLET | Freq: Four times a day (QID) | ORAL | 0 refills | Status: AC | PRN

## 2019-02-22 NOTE — Addendum Note (Signed)
Addended by: Gaspar Bidding D on: 02/22/2019 10:50 PM   Modules accepted: Orders

## 2019-02-25 ENCOUNTER — Telehealth (INDEPENDENT_AMBULATORY_CARE_PROVIDER_SITE_OTHER): Payer: Self-pay | Admitting: Pharmacist

## 2019-02-25 DIAGNOSIS — I429 Cardiomyopathy, unspecified: Secondary | ICD-10-CM

## 2019-02-25 NOTE — Progress Notes (Signed)
Patient ID: [name] [name]                 DOB: May 21, 1927                      MRN: [id number]     HPI: [name] [name] is a 85 y.o. female referred by Dr. Eden Emms to pharmacy clinic for CHF medication titration. PMH is significant for long standing non ischemic DCM, EF 25-30%, PVC, restless lef, HTN, anemia, anxiety and depression. At last visit, Entresto dose was increased to 49-51mg  BID. Visit today conducted via telehealth due to COVID-19 outbreak.  Patient reports feeling very well overall, denies LEE and dizziness. She does not weigh herself. Reports adherence with her current medications. She has been checking her BP at home:  127/77, HR 73 134/74, HR 77 122/63, HR 72 114/69, HR 78 - lowest reading 139/84, HR 71 140/87 - highest reading  Current CHF meds: carvedilol 25mg  twice a day, furosemide 10mg  daily, Entresto 49-51mg  twice a day  Social History: no tobacco, no ETOH  Diet: watches salt in her diet  Wt Readings from Last 3 Encounters:  01/21/19 147 lb 9.6 oz (67 kg)  12/31/18 149 lb 3.2 oz (67.7 kg)  12/25/18 149 lb 1.9 oz (67.6 kg)   BP Readings from Last 3 Encounters:  01/21/19 136/86  01/14/19 138/76  12/31/18 (!) 152/100   Pulse Readings from Last 3 Encounters:  01/21/19 79  01/14/19 74  12/31/18 82    Renal function: CrCl cannot be calculated (Patient's most recent lab result is older than the maximum 21 days allowed.).  Past Medical History:  Diagnosis Date   Anemia    Anxiety and depression 07/16/2009   ASD (atrial septal defect) per bubble study, 2004    Cardiomyopathy, normal cath 2007, normal EF per ECHO 2014    Hx of colonic polyps    Hypertension    Osteoarthritis    Osteopenia    Podagra 04/29/2014   RLS (restless legs syndrome) 07/10/2009    Current Outpatient Medications on File Prior to Visit  Medication Sig Dispense Refill   aspirin 81 MG tablet Take 81 mg by mouth daily.     buPROPion (WELLBUTRIN XL) 150 MG 24  hr tablet Take 450 mg by mouth every morning.      carvedilol (COREG) 25 MG tablet TAKE 1 TABLET (25 MG TOTAL) BY MOUTH 2 (TWO) TIMES DAILY WITH A MEAL. 180 tablet 1   furosemide (LASIX) 20 MG tablet Take 0.5 tablets (10 mg total) by mouth daily. 45 tablet 3   latanoprost (XALATAN) 0.005 % ophthalmic solution Place 1 drop into both eyes every evening.     mirtazapine (REMERON) 30 MG tablet Take 30 mg by mouth at bedtime.      Multiple Vitamin (MULTIVITAMIN) tablet Take 1 tablet by mouth every morning.      polyethylene glycol (MIRALAX / GLYCOLAX) packet Take 17 g by mouth 3 (three) times a week. Mon, wed, and fri     sacubitril-valsartan (ENTRESTO) 49-51 MG Take 1 tablet by mouth 2 (two) times daily. 60 tablet 11   traMADol (ULTRAM) 50 MG tablet Take 1 tablet (50 mg total) by mouth every 6 (six) hours as needed for up to 5 days for moderate pain. 20 tablet 0   No current facility-administered medications on file prior to visit.     Allergies  Allergen Reactions   Shellfish Allergy Anaphylaxis   Codeine Other (See Comments)  Unknown.   Hydrocodone Other (See Comments)    Unknown.   Adhesive [Tape] Rash    There were no vitals taken for this visit.   Assessment/Plan:  1. CHF medication titration - BP at home is well controlled at goal <130/58mmHg. We do have room to further optimize CHF medications by starting spironolactone, however pt does not feel safe leaving her home to have follow up labs drawn at this time due to COVID. Will continue carvedilol 25mg  BID, Entresto 49-51mg  BID, and furosemide 10mg  daily. She will start weighing herself and continue to monitor her BP at home. I will call pt in June and plan to start spironolactone at that time with f/u BMET 1 week later.  Cortland Crehan E. Lynde Ludwig, PharmD, BCACP, CPP  [facility] [address], [place], [place] [address] Phone: [phone]; Fax: [phone] 02/25/2019 2:39 PM

## 2019-03-04 ENCOUNTER — Ambulatory Visit: Payer: Medicare Other

## 2019-03-14 ENCOUNTER — Telehealth: Payer: Self-pay

## 2019-03-14 NOTE — Telephone Encounter (Signed)
Virtual Visit Pre-Appointment Phone Call  "(Name), I am calling you today to discuss your upcoming appointment. We are currently trying to limit exposure to the virus that causes COVID-19 by seeing patients at home rather than in the office."  1. "What is the BEST phone number to call the day of the visit?" - include this in appointment notes  2. "Do you have or have access to (through a family member/friend) a smartphone with video capability that we can use for your visit?" a. If yes - list this number in appt notes as "cell" (if different from BEST phone #) and list the appointment type as a VIDEO visit in appointment notes b. If no - list the appointment type as a PHONE visit in appointment notes  3. Confirm consent - "In the setting of the current Covid19 crisis, you are scheduled for a (phone or video) visit with your provider on (date) at (time).  Just as we do with many in-office visits, in order for you to participate in this visit, we must obtain consent.  If you'd like, I can send this to your mychart (if signed up) or email for you to review.  Otherwise, I can obtain your verbal consent now.  All virtual visits are billed to your insurance company just like a normal visit would be.  By agreeing to a virtual visit, we'd like you to understand that the technology does not allow for your provider to perform an examination, and thus may limit your provider's ability to fully assess your condition. If your provider identifies any concerns that need to be evaluated in person, we will make arrangements to do so.  Finally, though the technology is pretty good, we cannot assure that it will always work on either your or our end, and in the setting of a video visit, we may have to convert it to a phone-only visit.  In either situation, we cannot ensure that we have a secure connection.  Are you willing to proceed? YES  4. Advise patient to be prepared - "Two hours prior to your appointment, go  ahead and check your blood pressure, pulse, oxygen saturation, and your weight (if you have the equipment to check those) and write them all down. When your visit starts, your provider will ask you for this information. If you have an Apple Watch or Kardia device, please plan to have heart rate information ready on the day of your appointment. Please have a pen and paper handy nearby the day of the visit as well."  5. Give patient instructions for MyChart download to smartphone OR [email or web address] as below if video visit (depending on what platform provider is using)  6. Inform patient they will receive a phone call 15 minutes prior to their appointment time (may be from unknown caller ID) so they should be prepared to answer    TELEPHONE CALL NOTE  Rosita Kea Cervantes has been deemed a candidate for a follow-up tele-health visit to limit community exposure during the Covid-19 pandemic. I spoke with the patient via phone to ensure availability of phone/video source, confirm preferred email & phone number, and discuss instructions and expectations.  I reminded [place] to be prepared with any vital sign and/or heart rhythm information that could potentially be obtained via home monitoring, at the time of her visit. I reminded [place] to expect a phone call prior to her visit.  Ethelda Chick, RN 03/14/2019 10:34 AM   IF  USING DOXIMITY or DOXY.ME - The patient will receive a link just prior to their visit by text.     FULL LENGTH CONSENT FOR TELE-HEALTH VISIT   I hereby voluntarily request, consent and authorize [facility] and its employed or contracted physicians, physician assistants, nurse practitioners or other licensed health care professionals (the Practitioner), to provide me with telemedicine health care services (the "Services") as deemed necessary by the treating Practitioner. I acknowledge and consent to receive the Services by the Practitioner via  telemedicine. I understand that the telemedicine visit will involve communicating with the Practitioner through live audiovisual communication technology and the disclosure of certain medical information by electronic transmission. I acknowledge that I have been given the opportunity to request an in-person assessment or other available alternative prior to the telemedicine visit and am voluntarily participating in the telemedicine visit.  I understand that I have the right to withhold or withdraw my consent to the use of telemedicine in the course of my care at any time, without affecting my right to future care or treatment, and that the Practitioner or I may terminate the telemedicine visit at any time. I understand that I have the right to inspect all information obtained and/or recorded in the course of the telemedicine visit and may receive copies of available information for a reasonable fee.  I understand that some of the potential risks of receiving the Services via telemedicine include:  Marland Kitchen Delay or interruption in medical evaluation due to technological equipment failure or disruption; . Information transmitted may not be sufficient (e.g. poor resolution of images) to allow for appropriate medical decision making by the Practitioner; and/or  . In rare instances, security protocols could fail, causing a breach of personal health information.  Furthermore, I acknowledge that it is my responsibility to provide information about my medical history, conditions and care that is complete and accurate to the best of my ability. I acknowledge that Practitioner's advice, recommendations, and/or decision may be based on factors not within their control, such as incomplete or inaccurate data provided by me or distortions of diagnostic images or specimens that may result from electronic transmissions. I understand that the practice of medicine is not an exact science and that Practitioner makes no warranties or  guarantees regarding treatment outcomes. I acknowledge that I will receive a copy of this consent concurrently upon execution via email to the email address I last provided but may also request a printed copy by calling the office of [facility].    I understand that my insurance will be billed for this visit.   I have read or had this consent read to me. . I understand the contents of this consent, which adequately explains the benefits and risks of the Services being provided via telemedicine.  . I have been provided ample opportunity to ask questions regarding this consent and the Services and have had my questions answered to my satisfaction. . I give my informed consent for the services to be provided through the use of telemedicine in my medical care  By participating in this telemedicine visit I agree to the above.

## 2019-03-14 NOTE — Telephone Encounter (Signed)
Left message for patient to call back. Patient has appointment on 03/20/19. Need to discuss changing appointment to a virtual visit or rescheduling to August.

## 2019-03-18 NOTE — Progress Notes (Signed)
Virtual Visit via Video Note   This visit type was conducted due to national recommendations for restrictions regarding the COVID-19 Pandemic (e.g. social distancing) in an effort to limit this patient's exposure and mitigate transmission in our community.  Due to her co-morbid illnesses, this patient is at least at moderate risk for complications without adequate follow up.  This format is felt to be most appropriate for this patient at this time.  All issues noted in this document were discussed and addressed.  A limited physical exam was performed with this format.  Please refer to the patient's chart for her consent to telehealth for [facility].   Date:  03/20/2019   ID:  [name] [name], [name] [name] Apr 11, 1927, MRN [id number]  Patient Location: Home Provider Location: Office  PCP:  Helane Rima, DO  Cardiologist:  Charlton Haws, MD   Electrophysiologist:  None   Evaluation Performed:  Follow-Up Visit  Chief Complaint:  Chronic Systolic CHF  History of Present Illness:    85 y.o. with history of presumed long standing non ischemic DCM. Normal cath back in 2007. Has been functional class 2 for age. Echo  03/15/17 EF  35% with mild MR and estimated PA systolic pressure of 43 mmHg. She has history of asymptomatic PVC;s. As well as restless legs, HTN, anemia and anxiety / depression   No edema, PND, orthopnea.  Lives with daughter Still driving.  No chest pain palpitations or syncope  She is swimming at [employer] to finish a memoir about the town of [place]    TTE reviewed from 12/17/18 EF 25-30% grade 2 diastolic estimated PA pressure 63 mmhg midl to moderate MR At thiat time started on Entresto. Dose titrated Last Seen by Pharm D 02/25/19 TeleVisit and wanted to add aldactone but patient scared to come to office for f/u BMET so not added  She is doing well with no edema and no hypotension Discussed leaving meds where they are since f/u and lab work is hard to do with  COVID19 restrictions and she is doing well    The patient does not have symptoms concerning for COVID-19 infection (fever, chills, cough, or new shortness of breath).    Past Medical History:  Diagnosis Date  . Anemia   . Anxiety and depression 07/16/2009  . ASD (atrial septal defect) per bubble study, 2004   . Cardiomyopathy, normal cath 2007, normal EF per ECHO 2014   . Hx of colonic polyps   . Hypertension   . Osteoarthritis   . Osteopenia   . Podagra 04/29/2014  . RLS (restless legs syndrome) 07/10/2009   Past Surgical History:  Procedure Laterality Date  . BACK SURGERY  2000  . BREAST SURGERY     NEGATIVE BIOPSY  . TONSILLECTOMY       Current Meds  Medication Sig  . aspirin 81 MG tablet Take 81 mg by mouth daily.  Marland Kitchen buPROPion (WELLBUTRIN XL) 150 MG 24 hr tablet Take 450 mg by mouth every morning.   . carvedilol (COREG) 25 MG tablet TAKE 1 TABLET (25 MG TOTAL) BY MOUTH 2 (TWO) TIMES DAILY WITH A MEAL.  . furosemide (LASIX) 20 MG tablet Take 0.5 tablets (10 mg total) by mouth daily.  Marland Kitchen latanoprost (XALATAN) 0.005 % ophthalmic solution Place 1 drop into both eyes every evening.  . mirtazapine (REMERON) 30 MG tablet Take 30 mg by mouth at bedtime.   . Multiple Vitamin (MULTIVITAMIN) tablet Take 1 tablet by mouth every morning.   Marland Kitchen  polyethylene glycol (MIRALAX / GLYCOLAX) packet Take 17 g by mouth 3 (three) times a week. Mon, wed, and fri  . sacubitril-valsartan (ENTRESTO) 49-51 MG Take 1 tablet by mouth 2 (two) times daily.     Allergies:   Shellfish allergy; Codeine; Hydrocodone; and Adhesive [tape]   Social History   Tobacco Use  . Smoking status: Never Smoker  . Smokeless tobacco: Never Used  Substance Use Topics  . Alcohol use: No    Alcohol/week: 0.0 standard drinks  . Drug use: No     Family Hx: The patient's family history includes Heart attack in her father; Heart disease in her mother; Hypertension in an other family member. There is no history of Diabetes  or Colon cancer.  ROS:   Please see the history of present illness.     All other systems reviewed and are negative.   Prior CV studies:   The following studies were reviewed today:  Echo 12/17/18  Labs/Other Tests and Data Reviewed:    EKG:   SR rate 85 PVC nonspecific ST changes 12/03/18  Recent Labs: 10/25/2018: ALT 10; Hemoglobin 13.2; Platelets 167.0; TSH 1.80 01/21/2019: BUN 34; Creatinine, Ser 1.23; Potassium 4.2; Sodium 141   Recent Lipid Panel Lab Results  Component Value Date/Time   CHOL 189 10/25/2018 11:59 AM   TRIG 102.0 10/25/2018 11:59 AM   HDL 61.20 10/25/2018 11:59 AM   CHOLHDL 3 10/25/2018 11:59 AM   LDLCALC 108 (H) 10/25/2018 11:59 AM   LDLDIRECT 116.5 11/08/2012 09:35 AM    Wt Readings from Last 3 Encounters:  03/20/19 66.2 kg  01/21/19 67 kg  12/31/18 67.7 kg     Objective:    Vital Signs:  BP (!) 152/89   Pulse 75   Ht 5\' 5"  (1.651 m)   Wt 66.2 kg   LMP  (LMP Unknown)   BMI 24.30 kg/m    Elderly black female No distress No JVP elevation No tachypnea Mild LE edema  ASSESSMENT & PLAN:    HTN: Well controlled.  Continue current medications and low sodium Dash type diet.   DCM: EF declined echo 12/17/18 25-30% now on Entresto. Discussed adding Aldactone and need for BMET will Hold for 6 months given difficulty following with COVID restrictions and patient fears  PVC" asymptomatic quiescent continue beta blocker   PFO: not noted on distant  TTE not last few   No TIA symptoms no need for f/u at this time   Anxiety/Depression  Stable continue welburin   COVID-19 Education: The signs and symptoms of COVID-19 were discussed with the patient and how to seek care for testing (follow up with PCP or arrange E-visit).  The importance of social distancing was discussed today.  Time:   Today, I have spent 30 minutes with the patient with telehealth technology discussing the above problems.     Medication Adjustments/Labs and Tests  Ordered: Current medicines are reviewed at length with the patient today.  Concerns regarding medicines are outlined above.   Tests Ordered: No orders of the defined types were placed in this encounter.   Medication Changes: No orders of the defined types were placed in this encounter.   Disposition:  Follow up in 3 months  Signed, Charlton Haws, MD  03/20/2019 11:16 AM    [facility]

## 2019-03-20 ENCOUNTER — Other Ambulatory Visit: Payer: Self-pay

## 2019-03-20 ENCOUNTER — Telehealth (INDEPENDENT_AMBULATORY_CARE_PROVIDER_SITE_OTHER): Payer: Medicare Other | Admitting: Cardiovascular Disease

## 2019-03-20 VITALS — BP 152/89 | HR 75 | Ht 65.0 in | Wt 146.0 lb

## 2019-03-20 DIAGNOSIS — I5043 Acute on chronic combined systolic (congestive) and diastolic (congestive) heart failure: Secondary | ICD-10-CM | POA: Diagnosis not present

## 2019-03-20 NOTE — Patient Instructions (Signed)

## 2019-04-13 ENCOUNTER — Other Ambulatory Visit: Payer: Self-pay | Admitting: Family Medicine

## 2019-04-21 ENCOUNTER — Telehealth: Payer: Self-pay | Admitting: Cardiovascular Disease

## 2019-04-21 DIAGNOSIS — I5043 Acute on chronic combined systolic (congestive) and diastolic (congestive) heart failure: Secondary | ICD-10-CM

## 2019-04-21 MED ORDER — SPIRONOLACTONE 25 MG PO TABS: 12.5000 mg | ORAL_TABLET | Freq: Every day | ORAL | 11 refills | Status: DC

## 2019-04-21 NOTE — Telephone Encounter (Signed)
Called pt to discuss BP readings and potentially adding spironolactone for HF benefit. Home BP readings include:   130/68, HR 66 152/86, HR 69 135/83, HR 70 150/85, HR 68 144/75, HR 73 158/98, HR 75 152/89, HR 75 139/79, HR 74 146/75, HR 72 124/67, HR 72  She is feeling well overall and walks each day and does an exercise program online. She watches the salt in her diet.  Will start spironolactone 12.5mg  daily for CHF benefit. She will continue on carvedilol 25mg  BID and Entresto 49-51mg  BID. Scheduled f/u BMET in 1 week.

## 2019-04-21 NOTE — Telephone Encounter (Signed)
New Message ° ° ° °Pt is returning call  ° ° ° °Please call back  °

## 2019-04-22 ENCOUNTER — Ambulatory Visit: Payer: Medicare Other | Admitting: Family Medicine

## 2019-04-23 ENCOUNTER — Ambulatory Visit: Payer: Medicare Other | Admitting: Family Medicine

## 2019-04-25 ENCOUNTER — Telehealth: Payer: Self-pay

## 2019-04-25 NOTE — Telephone Encounter (Addendum)
    COVID-19 Pre-Screening Questions:  . In the past 7 to 10 days have you had a cough,  shortness of breath, headache, congestion, fever (100 or greater) body aches, chills, sore throat, or sudden loss of taste or sense of smell? . Have you been around anyone with known Covid 19. . Have you been around anyone who is awaiting Covid 19 test results in the past 7 to 10 days? . Have you been around anyone who has been exposed to Covid 19, or has mentioned symptoms of Covid 19 within the past 7 to 10 days?  If you have any concerns/questions about symptoms patients report during screening (either on the phone or at threshold). Contact the provider seeing the patient or DOD for further guidance.  If neither are available contact a member of the leadership team.          Pt answered NO to all pre-screening questions. [id number] 04/25/2019

## 2019-04-27 NOTE — Progress Notes (Signed)
Virtual Visit via Video   Due to the COVID-19 pandemic, this visit was completed with telemedicine (audio/video) technology to reduce patient and provider exposure as well as to preserve personal protective equipment.   I connected with [name] [name] by a video enabled telemedicine application and verified that I am speaking with the correct person using two identifiers. Location patient: Home Location provider: [facility], Office Persons participating in the virtual visit: [name] [name], [name] Lansberry, DO Barnie MortJoEllen Thompson, CMA acting as scribe for Dr. Helane RimaErica Segundo [name].   I discussed the limitations of evaluation and management by telemedicine and the availability of in person appointments. The patient expressed understanding and agreed to proceed.  Care Team   Patient Care Team: [name] RimaWallace, Ravan Schlemmer, DO as PCP - General (Family Medicine) [name] StadeNishan, Peter C, MD as PCP - Cardiology (Cardiology) [name] AsaPlovsky, Gerald, MD as Consulting Physician (Psychiatry) [name] StadeNishan, Peter C, MD as Consulting Physician (Cardiology)  Subjective:   HPI: Patient in for follow up. She has been started on Spironolactone by cardiology on 04/22/19. Her readings have been between 139/82 at highest and lowest at 98/61. She has lab appointment with cardiology today. She had Echocardiogram done on 01/06/2019. She has noticed with the medications that she has had increased urine output. She was a little concerned about her last blood pressure that was low last night at 98/82. She did not have any symptoms with that reading. Her readings this morning was 138/76 and is feeling well. If she does have low readings she will hold her her spironolactone and give our office or cardiology a call. She is doing well with diet and fluid intake. She is doing online exercises 3 times a week with the [employer] and online yoga as well as some online zoom retirement workout session.   Review of Systems  Constitutional: Negative for chills and  fever.  HENT: Negative for hearing loss and tinnitus.   Eyes: Negative for blurred vision and double vision.  Respiratory: Negative for cough and hemoptysis.   Cardiovascular: Negative for chest pain, palpitations and leg swelling.  Gastrointestinal: Negative for heartburn, nausea and vomiting.  Genitourinary: Negative for dysuria and urgency.  Musculoskeletal: Negative for myalgias and neck pain.  Skin: Negative for rash.  Neurological: Negative for dizziness and headaches.  Endo/Heme/Allergies: Does not bruise/bleed easily.  Psychiatric/Behavioral: Negative for depression and suicidal ideas.    Patient Active Problem List   Diagnosis Date Noted  . Insulin resistance 01/26/2019  . History of exercise intolerance 01/26/2019  . HLD (hyperlipidemia) 04/07/2018  . DJD (degenerative joint disease) 02/09/2010  . PVCs (premature ventricular contractions) 07/20/2009  . Anxiety and depression 07/16/2009  . Cardiomyopathy (H[facility]) 04/23/2007  . HTN (hypertension) 04/15/2007  . Osteopenia 04/15/2007    Social History   Tobacco Use  . Smoking status: Never Smoker  . Smokeless tobacco: Never Used  Substance Use Topics  . Alcohol use: No    Alcohol/week: 0.0 standard drinks    Current Outpatient Medications:  .  aspirin 81 MG tablet, Take 81 mg by mouth daily., Disp: , Rfl:  .  buPROPion (WELLBUTRIN XL) 150 MG 24 hr tablet, Take 450 mg by mouth every morning. , Disp: , Rfl:  .  carvedilol (COREG) 25 MG tablet, Take 1 tablet (25 mg total) by mouth 2 (two) times daily with a meal., Disp: 180 tablet, Rfl: 1 .  furosemide (LASIX) 20 MG tablet, Take 0.5 tablets (10 mg total) by mouth daily., Disp: 45 tablet, Rfl: 3 .  latanoprost (  XALATAN) 0.005 % ophthalmic solution, Place 1 drop into both eyes every evening., Disp: , Rfl:  .  mirtazapine (REMERON) 30 MG tablet, Take 30 mg by mouth at bedtime. , Disp: , Rfl:  .  Multiple Vitamin (MULTIVITAMIN) tablet, Take 1 tablet by mouth every morning. ,  Disp: , Rfl:  .  polyethylene glycol (MIRALAX / GLYCOLAX) packet, Take 17 g by mouth 3 (three) times a week. Mon, wed, and fri, Disp: , Rfl:  .  sacubitril-valsartan (ENTRESTO) 49-51 MG, Take 1 tablet by mouth 2 (two) times daily., Disp: 60 tablet, Rfl: 11 .  spironolactone (ALDACTONE) 25 MG tablet, Take 0.5 tablets (12.5 mg total) by mouth daily., Disp: 15 tablet, Rfl: 11  Allergies  Allergen Reactions  . Shellfish Allergy Anaphylaxis  . Codeine Other (See Comments)    Unknown.  Marland Kitchen Hydrocodone Other (See Comments)    Unknown.  . Adhesive [Tape] Rash    Objective:   VITALS: Per patient if applicable, see vitals. GENERAL: Alert, appears well and in no acute distress. HEENT: Atraumatic, conjunctiva clear, no obvious abnormalities on inspection of external nose and ears. NECK: Normal movements of the head and neck. CARDIOPULMONARY: No increased WOB. Speaking in clear sentences. I:E ratio WNL.  MS: Moves all visible extremities without noticeable abnormality. PSYCH: Pleasant and cooperative, well-groomed. Speech normal rate and rhythm. Affect is appropriate. Insight and judgement are appropriate. Attention is focused, linear, and appropriate.  NEURO: CN grossly intact. Oriented as arrived to appointment on time with no prompting. Moves both UE equally.  SKIN: No obvious lesions, wounds, erythema, or cyanosis noted on face or hands.  Depression screen [facility] 2/9 04/28/2019 01/21/2019 07/26/2018  Decreased Interest 0 0 0  Down, Depressed, Hopeless 0 0 0  PHQ - 2 Score 0 0 0  Altered sleeping 0 0 0  Tired, decreased energy 0 0 0  Change in appetite 0 0 0  Feeling bad or failure about yourself  0 0 0  Trouble concentrating 0 0 0  Moving slowly or fidgety/restless 0 0 0  Suicidal thoughts 0 0 0  PHQ-9 Score 0 0 0  Difficult doing work/chores Not difficult at all Not difficult at all Not difficult at all   Assessment and Plan:   [name] [name] was seen today for follow-up.  Diagnoses and all  orders for this visit:  Insulin resistance  Pure hypercholesterolemia  Essential hypertension Comments: Improving. One hypotensive episode with no symptoms. Has lab draw today at Cardiology.  Orders: -     carvedilol (COREG) 25 MG tablet; Take 1 tablet (25 mg total) by mouth 2 (two) times daily with a meal.  Cardiomyopathy, unspecified type ([place])   . COVID-19 Education: The signs and symptoms of COVID-19 were discussed with the patient and how to seek care for testing if needed. The importance of social distancing was discussed today. . Reviewed expectations re: course of current medical issues. . Discussed self-management of symptoms. . Outlined signs and symptoms indicating need for more acute intervention. . Patient verbalized understanding and all questions were answered. Marland Kitchen Health Maintenance issues including appropriate healthy diet, exercise, and smoking avoidance were discussed with patient. . See orders for this visit as documented in the electronic medical record.  Briscoe Deutscher, DO  Records requested if needed. Time spent: 25 minutes, of which >50% was spent in obtaining information about her symptoms, reviewing her previous labs, evaluations, and treatments, counseling her about her condition (please see the discussed topics above), and developing a plan to  further investigate it; she had a number of questions which I addressed.

## 2019-04-28 ENCOUNTER — Encounter: Payer: Self-pay | Admitting: Family Medicine

## 2019-04-28 ENCOUNTER — Other Ambulatory Visit: Payer: Medicare Other | Admitting: *Deleted

## 2019-04-28 ENCOUNTER — Ambulatory Visit (INDEPENDENT_AMBULATORY_CARE_PROVIDER_SITE_OTHER): Payer: Medicare Other | Admitting: Family Medicine

## 2019-04-28 ENCOUNTER — Other Ambulatory Visit: Payer: Self-pay

## 2019-04-28 VITALS — BP 138/76 | Ht 65.0 in | Wt 146.0 lb

## 2019-04-28 DIAGNOSIS — E88819 Insulin resistance, unspecified: Secondary | ICD-10-CM

## 2019-04-28 DIAGNOSIS — E8881 Metabolic syndrome: Secondary | ICD-10-CM

## 2019-04-28 DIAGNOSIS — E7800 Pure hypercholesterolemia, unspecified: Secondary | ICD-10-CM | POA: Diagnosis not present

## 2019-04-28 DIAGNOSIS — I5043 Acute on chronic combined systolic (congestive) and diastolic (congestive) heart failure: Secondary | ICD-10-CM

## 2019-04-28 DIAGNOSIS — I429 Cardiomyopathy, unspecified: Secondary | ICD-10-CM | POA: Diagnosis not present

## 2019-04-28 DIAGNOSIS — I10 Essential (primary) hypertension: Secondary | ICD-10-CM | POA: Diagnosis not present

## 2019-04-28 MED ORDER — CARVEDILOL 25 MG PO TABS: 25.0000 mg | ORAL_TABLET | Freq: Two times a day (BID) | ORAL | 1 refills | Status: DC

## 2019-04-29 ENCOUNTER — Telehealth: Payer: Self-pay | Admitting: Cardiovascular Disease

## 2019-04-29 DIAGNOSIS — R7989 Other specified abnormal findings of blood chemistry: Secondary | ICD-10-CM

## 2019-04-29 DIAGNOSIS — Z79899 Other long term (current) drug therapy: Secondary | ICD-10-CM

## 2019-04-29 LAB — BASIC METABOLIC PANEL
BUN/Creatinine Ratio: 26 (ref 12–28)
BUN: 37 mg/dL — ABNORMAL HIGH (ref 8–27)
CO2: 20 mmol/L (ref 20–29)
Calcium: 9.6 mg/dL (ref 8.7–10.3)
Chloride: 104 mmol/L (ref 96–106)
Creatinine, Ser: 1.43 mg/dL — ABNORMAL HIGH (ref 0.57–1.00)
GFR calc Af Amer: 37 mL/min/{1.73_m2} — ABNORMAL LOW (ref >59)
GFR calc non Af Amer: 32 mL/min/{1.73_m2} — ABNORMAL LOW (ref >59)
Glucose: 121 mg/dL — ABNORMAL HIGH (ref 65–99)
Potassium: 4.4 mmol/L (ref 3.5–5.2)
Sodium: 140 mmol/L (ref 134–144)

## 2019-04-29 NOTE — Telephone Encounter (Signed)
Follow Up: ° ° ° ° ° °Returning your call from today,concerning her lab results.  °

## 2019-04-29 NOTE — Telephone Encounter (Signed)
Called patient back with advisement about her lab work.  Per [place] Pharm D, Please continue your current medications. Please stay hydrated with water. Please try not to use any over the counter NSAIDS, like Advil, Ibuprofen, BC powers, etc. We will check your lab work again on 05/13/19, please come to our office for lab work.

## 2019-04-30 ENCOUNTER — Encounter: Payer: Self-pay | Admitting: Family Medicine

## 2019-05-13 ENCOUNTER — Other Ambulatory Visit: Payer: Medicare Other

## 2019-05-13 ENCOUNTER — Other Ambulatory Visit: Payer: Self-pay

## 2019-05-13 DIAGNOSIS — R7989 Other specified abnormal findings of blood chemistry: Secondary | ICD-10-CM

## 2019-05-13 DIAGNOSIS — Z79899 Other long term (current) drug therapy: Secondary | ICD-10-CM

## 2019-05-14 ENCOUNTER — Telehealth: Payer: Self-pay | Admitting: Cardiovascular Disease

## 2019-05-14 LAB — BASIC METABOLIC PANEL
BUN/Creatinine Ratio: 25 (ref 12–28)
BUN: 32 mg/dL — ABNORMAL HIGH (ref 8–27)
CO2: 23 mmol/L (ref 20–29)
Calcium: 9.9 mg/dL (ref 8.7–10.3)
Chloride: 102 mmol/L (ref 96–106)
Creatinine, Ser: 1.26 mg/dL — ABNORMAL HIGH (ref 0.57–1.00)
GFR calc Af Amer: 44 mL/min/{1.73_m2} — ABNORMAL LOW (ref >59)
GFR calc non Af Amer: 38 mL/min/{1.73_m2} — ABNORMAL LOW (ref >59)
Glucose: 92 mg/dL (ref 65–99)
Potassium: 4.6 mmol/L (ref 3.5–5.2)
Sodium: 136 mmol/L (ref 134–144)

## 2019-05-14 NOTE — Telephone Encounter (Signed)
Follow up:      Patient returning call concering some lab works. Please call patient.

## 2019-05-14 NOTE — Telephone Encounter (Signed)
Returned call to pt and discussed BMET results - see lab encounter for details.

## 2019-07-29 ENCOUNTER — Ambulatory Visit: Payer: Medicare Other | Admitting: Family Medicine

## 2019-07-30 ENCOUNTER — Telehealth: Payer: Self-pay | Admitting: Family Medicine

## 2019-07-30 NOTE — Telephone Encounter (Signed)
error 

## 2019-07-31 ENCOUNTER — Telehealth: Payer: Self-pay | Admitting: Physical Therapy

## 2019-07-31 NOTE — Telephone Encounter (Signed)
Called patient reviewed all questions she will keep virtual app if labs needed we will make a lab app for a later time.

## 2019-07-31 NOTE — Telephone Encounter (Signed)
Copied from [place] [id number]. Topic: General - Other >> Jul 31, 2019 10:06 AM Rainey Pines A wrote: Patient would like a callback from nurse in regards to having lab order placed for upcoming appt

## 2019-08-05 ENCOUNTER — Ambulatory Visit: Payer: Medicare Other | Admitting: Family Medicine

## 2019-08-07 NOTE — Progress Notes (Signed)
Virtual Visit via Video   Due to the COVID-19 pandemic, this visit was completed with telemedicine (audio/video) technology to reduce patient and provider exposure as well as to preserve personal protective equipment.   I connected with [name] [name] by a video enabled telemedicine application and verified that I am speaking with the correct person using two identifiers. Location patient: Home Location provider: [facility], Office Persons participating in the virtual visit: [name] [name], [place], DO   I discussed the limitations of evaluation and management by telemedicine and the availability of in person appointments. The patient expressed understanding and agreed to proceed.  Care Team   Patient Care Team: Briscoe Deutscher, DO as PCP - General (Family Medicine) Josue Hector, MD as PCP - Cardiology (Cardiology) Norma Fredrickson, MD as Consulting Physician (Psychiatry) Josue Hector, MD as Consulting Physician (Cardiology)  Subjective:   HPI: Patient is doing very well.  She just celebrated her 89th birthday.  Family and friends celebrated by Estate agent by her house.  She said that there were over 130 people that drove by.  She is continuing to exercise, doing yoga 3 times a week.  No issue with exercise intolerance at this point.  Blood pressure has been maintained at perfect 120s over 80s.  No bradycardia or tachycardia.  No chest pain, shortness of breath, edema.  Taking spironolactone and Lasix.  Gets up a few times at night to urinate but not having issues with fatigue.  Still working on her book.  ROS   Patient Active Problem List   Diagnosis Date Noted  . Insulin resistance 01/26/2019  . History of exercise intolerance 01/26/2019  . HLD (hyperlipidemia) 04/07/2018  . DJD (degenerative joint disease) 02/09/2010  . PVCs (premature ventricular contractions) 07/20/2009  . Anxiety and depression 07/16/2009  . Cardiomyopathy ([place]) 04/23/2007  . HTN  (hypertension) 04/15/2007  . Osteopenia 04/15/2007    Social History   Tobacco Use  . Smoking status: Never Smoker  . Smokeless tobacco: Never Used  Substance Use Topics  . Alcohol use: No    Alcohol/week: 0.0 standard drinks    Current Outpatient Medications:  .  aspirin 81 MG tablet, Take 81 mg by mouth daily., Disp: , Rfl:  .  buPROPion (WELLBUTRIN XL) 150 MG 24 hr tablet, Take 450 mg by mouth every morning. , Disp: , Rfl:  .  carvedilol (COREG) 25 MG tablet, Take 1 tablet (25 mg total) by mouth 2 (two) times daily with a meal., Disp: 180 tablet, Rfl: 1 .  furosemide (LASIX) 20 MG tablet, Take 0.5 tablets (10 mg total) by mouth daily., Disp: 45 tablet, Rfl: 3 .  latanoprost (XALATAN) 0.005 % ophthalmic solution, Place 1 drop into both eyes every evening., Disp: , Rfl:  .  mirtazapine (REMERON) 30 MG tablet, Take 30 mg by mouth at bedtime. , Disp: , Rfl:  .  Multiple Vitamin (MULTIVITAMIN) tablet, Take 1 tablet by mouth every morning. , Disp: , Rfl:  .  polyethylene glycol (MIRALAX / GLYCOLAX) packet, Take 17 g by mouth 3 (three) times a week. Mon, wed, and fri, Disp: , Rfl:  .  sacubitril-valsartan (ENTRESTO) 49-51 MG, Take 1 tablet by mouth 2 (two) times daily., Disp: 60 tablet, Rfl: 11 .  spironolactone (ALDACTONE) 25 MG tablet, Take 0.5 tablets (12.5 mg total) by mouth daily., Disp: 15 tablet, Rfl: 11  Allergies  Allergen Reactions  . Shellfish Allergy Anaphylaxis  . Codeine Other (See Comments)    Unknown.  Marland Kitchen  Hydrocodone Other (See Comments)    Unknown.  . Adhesive [Tape] Rash    Objective:   VITALS: Per patient if applicable, see vitals. GENERAL: Alert, appears well and in no acute distress. HEENT: Atraumatic, conjunctiva clear, no obvious abnormalities on inspection of external nose and ears. NECK: Normal movements of the head and neck. CARDIOPULMONARY: No increased WOB. Speaking in clear sentences. I:E ratio WNL.  MS: Moves all visible extremities without  noticeable abnormality. PSYCH: Pleasant and cooperative, well-groomed. Speech normal rate and rhythm. Affect is appropriate. Insight and judgement are appropriate. Attention is focused, linear, and appropriate.  NEURO: CN grossly intact. Oriented as arrived to appointment on time with no prompting. Moves both UE equally.  SKIN: No obvious lesions, wounds, erythema, or cyanosis noted on face or hands.  Depression screen [facility] 2/9 08/08/2019 04/28/2019 01/21/2019  Decreased Interest 0 0 0  Down, Depressed, Hopeless 0 0 0  PHQ - 2 Score 0 0 0  Altered sleeping 0 0 0  Tired, decreased energy 0 0 0  Change in appetite 0 0 0  Feeling bad or failure about yourself  0 0 0  Trouble concentrating 0 0 0  Moving slowly or fidgety/restless 0 0 0  Suicidal thoughts 0 0 0  PHQ-9 Score 0 0 0  Difficult doing work/chores Not difficult at all Not difficult at all Not difficult at all    Assessment and Plan:   [name] [name] was seen today for follow-up.  Diagnoses and all orders for this visit:  Insulin resistance Comments: Doing well.  Will order repeat labs. Orders: -     Hemoglobin A1c; Future  Pure hypercholesterolemia Comments: Doing well.  Due for labs. Orders: -     Comprehensive metabolic panel; Future -     Lipid panel; Future  Essential hypertension Comments: Blood pressure controlled with today's blood pressure of 123/80 with a pulse of 70. Orders: -     CBC with Differential/Platelet; Future -     Comprehensive metabolic panel; Future -     Magnesium; Future  Osteopenia Comments: Exercising 3 times a week doing yoga. Orders: -     VITAMIN D 25 Hydroxy (Vit-D Deficiency, Fractures); Future  Vitamin D deficiency -     VITAMIN D 25 Hydroxy (Vit-D Deficiency, Fractures); Future    . COVID-19 Education: The signs and symptoms of COVID-19 were discussed with the patient and how to seek care for testing if needed. The importance of social distancing was discussed today. . Reviewed  expectations re: course of current medical issues. . Discussed self-management of symptoms. . Outlined signs and symptoms indicating need for more acute intervention. . Patient verbalized understanding and all questions were answered. Marland Kitchen Health Maintenance issues including appropriate healthy diet, exercise, and smoking avoidance were discussed with patient. . See orders for this visit as documented in the electronic medical record.  Helane Rima, DO  Records requested if needed. Time spent: 15 minutes, of which >50% was spent in obtaining information about her symptoms, reviewing her previous labs, evaluations, and treatments, counseling her about her condition (please see the discussed topics above), and developing a plan to further investigate it; she had a number of questions which I addressed.

## 2019-08-08 ENCOUNTER — Ambulatory Visit (INDEPENDENT_AMBULATORY_CARE_PROVIDER_SITE_OTHER): Payer: Medicare Other | Admitting: Family Medicine

## 2019-08-08 ENCOUNTER — Encounter: Payer: Self-pay | Admitting: Family Medicine

## 2019-08-08 VITALS — BP 123/80 | HR 79 | Ht 65.0 in | Wt 142.0 lb

## 2019-08-08 DIAGNOSIS — M8580 Other specified disorders of bone density and structure, unspecified site: Secondary | ICD-10-CM

## 2019-08-08 DIAGNOSIS — E88819 Insulin resistance, unspecified: Secondary | ICD-10-CM

## 2019-08-08 DIAGNOSIS — E559 Vitamin D deficiency, unspecified: Secondary | ICD-10-CM

## 2019-08-08 DIAGNOSIS — I10 Essential (primary) hypertension: Secondary | ICD-10-CM

## 2019-08-08 DIAGNOSIS — E7800 Pure hypercholesterolemia, unspecified: Secondary | ICD-10-CM

## 2019-08-08 DIAGNOSIS — E8881 Metabolic syndrome: Secondary | ICD-10-CM

## 2019-08-12 ENCOUNTER — Encounter: Payer: Self-pay | Admitting: Family Medicine

## 2019-08-14 ENCOUNTER — Encounter: Payer: Self-pay | Admitting: Family Medicine

## 2019-08-20 NOTE — Telephone Encounter (Signed)
Left voicemail message for patient requesting a call back to schedule patient for a TOC appt with Dr. Rogers Blocker.

## 2019-08-20 NOTE — Telephone Encounter (Signed)
Pt called back in to sch appt but she wanted to been seen before Dr Quay Burow 1st available which is 10/30.  She would like a call back to see if she can get in earlier, office was on other calls, she would like to be worked in next week sometime   [phone]

## 2019-08-21 ENCOUNTER — Telehealth: Payer: Self-pay

## 2019-08-21 NOTE — Telephone Encounter (Signed)
Spoke to patient and advised that our soonest available appointment would be 10/30 for TOC appt.  Patient verbalized understanding and accepted appointment.

## 2019-09-12 ENCOUNTER — Encounter: Payer: Self-pay | Admitting: Family Medicine

## 2019-09-12 ENCOUNTER — Other Ambulatory Visit: Payer: Self-pay

## 2019-09-12 ENCOUNTER — Ambulatory Visit: Payer: Medicare Other | Admitting: Family Medicine

## 2019-09-12 VITALS — BP 128/82 | HR 74 | Temp 97.3°F | Ht 65.0 in | Wt 147.0 lb

## 2019-09-12 DIAGNOSIS — M8580 Other specified disorders of bone density and structure, unspecified site: Secondary | ICD-10-CM

## 2019-09-12 DIAGNOSIS — E8881 Metabolic syndrome: Secondary | ICD-10-CM

## 2019-09-12 DIAGNOSIS — E782 Mixed hyperlipidemia: Secondary | ICD-10-CM | POA: Diagnosis not present

## 2019-09-12 DIAGNOSIS — I10 Essential (primary) hypertension: Secondary | ICD-10-CM | POA: Diagnosis not present

## 2019-09-12 DIAGNOSIS — I428 Other cardiomyopathies: Secondary | ICD-10-CM

## 2019-09-12 LAB — CBC WITH DIFFERENTIAL/PLATELET
Basophils Absolute: 0.1 10*3/uL (ref 0.0–0.1)
Basophils Relative: 1.5 % (ref 0.0–3.0)
Eosinophils Absolute: 0.1 10*3/uL (ref 0.0–0.7)
Eosinophils Relative: 3.3 % (ref 0.0–5.0)
HCT: 37.3 % (ref 36.0–46.0)
Hemoglobin: 12.5 g/dL (ref 12.0–15.0)
Lymphocytes Relative: 32.3 % (ref 12.0–46.0)
Lymphs Abs: 1.4 10*3/uL (ref 0.7–4.0)
MCHC: 33.5 g/dL (ref 30.0–36.0)
MCV: 101 fl — ABNORMAL HIGH (ref 78.0–100.0)
Monocytes Absolute: 0.4 10*3/uL (ref 0.1–1.0)
Monocytes Relative: 10 % (ref 3.0–12.0)
Neutro Abs: 2.3 10*3/uL (ref 1.4–7.7)
Neutrophils Relative %: 52.9 % (ref 43.0–77.0)
Platelets: 193 10*3/uL (ref 150.0–400.0)
RBC: 3.69 Mil/uL — ABNORMAL LOW (ref 3.87–5.11)
RDW: 13.7 % (ref 11.5–15.5)
WBC: 4.3 10*3/uL (ref 4.0–10.5)

## 2019-09-12 LAB — MICROALBUMIN / CREATININE URINE RATIO
Creatinine,U: 56.2 mg/dL
Microalb Creat Ratio: 2.4 mg/g (ref 0.0–30.0)
Microalb, Ur: 1.3 mg/dL (ref 0.0–1.9)

## 2019-09-12 LAB — LIPID PANEL
Cholesterol: 192 mg/dL (ref 0–200)
HDL: 54 mg/dL (ref >39.00)
LDL Cholesterol: 122 mg/dL — ABNORMAL HIGH (ref 0–99)
NonHDL: 138.13
Total CHOL/HDL Ratio: 4
Triglycerides: 81 mg/dL (ref 0.0–149.0)
VLDL: 16.2 mg/dL (ref 0.0–40.0)

## 2019-09-12 LAB — COMPREHENSIVE METABOLIC PANEL
ALT: 9 U/L (ref 0–35)
AST: 16 U/L (ref 0–37)
Albumin: 4.5 g/dL (ref 3.5–5.2)
Alkaline Phosphatase: 46 U/L (ref 39–117)
BUN: 25 mg/dL — ABNORMAL HIGH (ref 6–23)
CO2: 25 mEq/L (ref 19–32)
Calcium: 9.7 mg/dL (ref 8.4–10.5)
Chloride: 106 mEq/L (ref 96–112)
Creatinine, Ser: 1.29 mg/dL — ABNORMAL HIGH (ref 0.40–1.20)
GFR: 46.96 mL/min — ABNORMAL LOW (ref >60.00)
Glucose, Bld: 98 mg/dL (ref 70–99)
Potassium: 4.6 mEq/L (ref 3.5–5.1)
Sodium: 141 mEq/L (ref 135–145)
Total Bilirubin: 0.4 mg/dL (ref 0.2–1.2)
Total Protein: 6.6 g/dL (ref 6.0–8.3)

## 2019-09-12 LAB — HEMOGLOBIN A1C: Hgb A1c MFr Bld: 5.8 % (ref 4.6–6.5)

## 2019-09-12 LAB — VITAMIN D 25 HYDROXY (VIT D DEFICIENCY, FRACTURES): VITD: 48.05 ng/mL (ref 30.00–100.00)

## 2019-09-12 NOTE — Progress Notes (Signed)
Patient: [name] [name] MRN: [id number] DOB: 12/18/1926 PCP: Briscoe Deutscher, DO     Subjective:  Chief Complaint  Patient presents with  . Establish Care    HPI: The patient is a 85 y.o. female who presents today for establishing care. She is transferring care from Dr. Juleen China in our clinic. She was just seen on 08/08/2019 and was told to follow up in 3 months. She has a PMHx of HTN, hyperlipidemia, osteopenia, cardiomyopathy followed by Dr. Johnsie Cancel. She had everything addressed, but did not have her lab work done. She is fasting today.   She is wanting to know if we can send in her lasix in at a 10mg  dose. She is also wanting to know about her remeron as she has not taken in the past 3 nights and slept really good.   Review of Systems  Constitutional: Negative for chills, fatigue and fever.  HENT: Negative for dental problem, ear pain, hearing loss and trouble swallowing.   Eyes: Negative for visual disturbance.  Respiratory: Negative for cough, chest tightness and shortness of breath.   Cardiovascular: Negative for chest pain, palpitations and leg swelling.  Gastrointestinal: Negative for abdominal pain, blood in stool, diarrhea and nausea.  Endocrine: Negative for cold intolerance, polydipsia, polyphagia and polyuria.  Genitourinary: Negative for dysuria and hematuria.  Musculoskeletal: Negative for arthralgias.  Skin: Negative for rash.  Neurological: Negative for dizziness and headaches.  Psychiatric/Behavioral: Negative for dysphoric mood and sleep disturbance. The patient is not nervous/anxious.     Allergies Patient is allergic to shellfish allergy; codeine; hydrocodone; and adhesive [tape].  Past Medical History Patient  has a past medical history of Anemia, Anxiety and depression (07/16/2009), ASD (atrial septal defect) per bubble study, 2004, Cardiomyopathy, normal cath 2007, normal EF per ECHO 2014, colonic polyps, Hypertension, Osteoarthritis, Osteopenia, Podagra  (04/29/2014), and RLS (restless legs syndrome) (07/10/2009).  Surgical History Patient  has a past surgical history that includes Tonsillectomy; Back surgery (2000); and Breast surgery.  Family History Pateint's family history includes Heart attack in her father; Heart disease in her mother; Hypertension in an other family member.  Social History Patient  reports that she has never smoked. She has never used smokeless tobacco. She reports that she does not drink alcohol or use drugs.    Objective: Vitals:   09/12/19 0808  BP: 128/82  Pulse: 74  Temp: (!) 97.3 F (36.3 C)  TempSrc: Temporal  SpO2: 96%  Weight: 147 lb (66.7 kg)  Height: 5\' 5"  (1.651 m)    Body mass index is 24.46 kg/m.  Physical Exam Vitals signs reviewed.  Constitutional:      Appearance: Normal appearance. She is well-developed.  HENT:     Head: Normocephalic and atraumatic.     Right Ear: External ear normal.     Left Ear: External ear normal.     Mouth/Throat:     Mouth: Mucous membranes are moist.  Eyes:     Conjunctiva/sclera: Conjunctivae normal.     Pupils: Pupils are equal, round, and reactive to light.  Neck:     Musculoskeletal: Normal range of motion and neck supple.     Thyroid: No thyromegaly.     Vascular: No carotid bruit.  Cardiovascular:     Rate and Rhythm: Normal rate and regular rhythm.     Heart sounds: Normal heart sounds. No murmur.  Pulmonary:     Effort: Pulmonary effort is normal.     Breath sounds: Normal breath sounds.  Abdominal:  General: Abdomen is flat. Bowel sounds are normal. There is no distension.     Palpations: Abdomen is soft.     Tenderness: There is no abdominal tenderness.  Musculoskeletal:     Right lower leg: No edema.     Left lower leg: No edema.  Lymphadenopathy:     Cervical: No cervical adenopathy.  Skin:    General: Skin is warm and dry.     Findings: No rash.  Neurological:     General: No focal deficit present.     Mental Status: She  is alert and oriented to person, place, and time.     Cranial Nerves: No cranial nerve deficit.     Coordination: Coordination normal.     Deep Tendon Reflexes: Reflexes normal.  Psychiatric:        Mood and Affect: Mood normal.        Behavior: Behavior normal.        Assessment/plan: 1. Essential hypertension Blood pressure is to goal. Continue current anti-hypertensive medications. No Refills given and routine lab work will be done today. Recommended routine exercise and healthy diet including DASH diet and mediterranean diet. Encouraged weight loss. F/u in 6 months.   - CBC with Differential/Platelet - Comprehensive metabolic panel - Microalbumin / creatinine urine ratio  2. Insulin resistance  - Hemoglobin A1c  3. Mixed hyperlipidemia Labs today. Not on any cholesterol medication  - Lipid panel  4. Osteopenia, unspecified location -normal DEXA in 2019. Checking vit. D today. Very active. She is on calcium and vitamin D.  - VITAMIN D 25 Hydroxy (Vit-D Deficiency, Fractures)  5. Other cardiomyopathy (HCC) Will see if dr. Eden Emms is okay with her just taking 20mg  of lasix as she is not really doing well cutting pill in half and it's crumbling. Watch potassium. Has f/u with him in a few weeks as well.   Return in about 6 months (around 03/12/2020) for routine appointment. 03/14/2020, MD [facility]  09/12/2019

## 2019-09-12 NOTE — Patient Instructions (Signed)
1) call Dr. Jeanella Craze about your remeron and see if okay if you stay off or if he wants to continue this.   2) I will see if Dr. Johnsie Cancel is okay with [place] just increasing your lasix to 20mg  so you don't have to cut this up. I will let you know.  3) I like to see you every 6 months, so schedule 6 month f/u from now.   So nice to meet you!  Dr. Rogers Blocker

## 2019-09-15 ENCOUNTER — Telehealth: Payer: Self-pay | Admitting: Family Medicine

## 2019-09-15 NOTE — Telephone Encounter (Signed)
Please let her know that her cardiologist does not want her to increase lasix without seeing him. Keep trying to cut in half until she sees him.  Dr. Rogers Blocker

## 2019-09-15 NOTE — Progress Notes (Signed)
Needs BMET / BNP not sure she needs that much need to examine and she is also on aldactone

## 2019-09-15 NOTE — Telephone Encounter (Signed)
Left detailed vm message on patient's cell phone advising of notes per Dr. Rogers Blocker.  Advised to c/b w/questions or concerns.

## 2019-09-23 NOTE — Progress Notes (Signed)
Date:  09/25/2019   ID:  [name] [name], [name] [name] 09-Sep-1927, MRN [id number]  Provider Location: Office  PCP:  Orland Mustard, MD  Cardiologist:  Charlton Haws, MD   Electrophysiologist:  None   Evaluation Performed:  Follow-Up Visit  Chief Complaint:  Chronic Systolic CHF  History of Present Illness:    85 y.o. with history of presumed long standing non ischemic DCM. Normal cath back in 2007. Has been functional class 2 for age. Echo  03/15/17 EF  35% with mild MR and estimated PA systolic pressure of 43 mmHg. She has history of asymptomatic PVC;s. As well as restless legs, HTN, anemia and anxiety / depression   No edema, PND, orthopnea.  Lives with daughter Still driving.  No chest pain palpitations or syncope  She is swimming at [employer] to finish a memoir about the town of [place]    TTE reviewed from 12/17/18 EF 25-30% grade 2 diastolic estimated PA pressure 63 mmhg midl to moderate MR At thiat time started on Entresto. Dose titrated Started On Aldactone June 2020 and labs ok   Cannot cut lasix and aldactone in half BP runs lower at home 100-120 mmHg systolic had a nice drive through at the church with over 100 people for her 16 th birthday Lives with only daughter and has 4 grand children     The patient does not have symptoms concerning for COVID-19 infection (fever, chills, cough, or new shortness of breath).    Past Medical History:  Diagnosis Date  . Anemia   . Anxiety and depression 07/16/2009  . ASD (atrial septal defect) per bubble study, 2004   . Cardiomyopathy, normal cath 2007, normal EF per ECHO 2014   . Hx of colonic polyps   . Hypertension   . Osteoarthritis   . Osteopenia   . Podagra 04/29/2014  . RLS (restless legs syndrome) 07/10/2009   Past Surgical History:  Procedure Laterality Date  . BACK SURGERY  2000  . BREAST SURGERY     NEGATIVE BIOPSY  . TONSILLECTOMY       Current Meds  Medication Sig  . aspirin 81 MG tablet Take 81 mg  by mouth daily.  Marland Kitchen buPROPion (WELLBUTRIN XL) 150 MG 24 hr tablet Take 450 mg by mouth every morning.   . carvedilol (COREG) 25 MG tablet Take 1 tablet (25 mg total) by mouth 2 (two) times daily with a meal.  . furosemide (LASIX) 20 MG tablet Take 0.5 tablets (10 mg total) by mouth daily.  Marland Kitchen latanoprost (XALATAN) 0.005 % ophthalmic solution Place 1 drop into both eyes every evening.  . mirtazapine (REMERON) 30 MG tablet Take 30 mg by mouth at bedtime.   . Multiple Vitamin (MULTIVITAMIN) tablet Take 1 tablet by mouth every morning.   . polyethylene glycol (MIRALAX / GLYCOLAX) packet Take 17 g by mouth 3 (three) times a week. Mon, wed, and fri  . sacubitril-valsartan (ENTRESTO) 49-51 MG Take 1 tablet by mouth 2 (two) times daily.  Marland Kitchen spironolactone (ALDACTONE) 25 MG tablet Take 0.5 tablets (12.5 mg total) by mouth daily.     Allergies:   Shellfish allergy, Codeine, Hydrocodone, and Adhesive [tape]   Social History   Tobacco Use  . Smoking status: Never Smoker  . Smokeless tobacco: Never Used  Substance Use Topics  . Alcohol use: No    Alcohol/week: 0.0 standard drinks  . Drug use: No     Family Hx: The patient's family history includes Heart attack  in her father; Heart disease in her mother; Hypertension in an other family member. There is no history of Diabetes or Colon cancer.  ROS:   Please see the history of present illness.     All other systems reviewed and are negative.   Prior CV studies:   The following studies were reviewed today:  Echo 12/17/18  Labs/Other Tests and Data Reviewed:    EKG:   SR rate 85 PVC nonspecific ST changes 12/03/18  Recent Labs: 10/25/2018: TSH 1.80 09/12/2019: ALT 9; BUN 25; Creatinine, Ser 1.29; Hemoglobin 12.5; Platelets 193.0; Potassium 4.6; Sodium 141   Recent Lipid Panel Lab Results  Component Value Date/Time   CHOL 192 09/12/2019 08:50 AM   TRIG 81.0 09/12/2019 08:50 AM   HDL 54.00 09/12/2019 08:50 AM   CHOLHDL 4 09/12/2019 08:50  AM   LDLCALC 122 (H) 09/12/2019 08:50 AM   LDLDIRECT 116.5 11/08/2012 09:35 AM    Wt Readings from Last 3 Encounters:  09/25/19 147 lb (66.7 kg)  09/12/19 147 lb (66.7 kg)  08/08/19 142 lb (64.4 kg)     Objective:    Vital Signs:  BP (!) 156/72   Pulse 82   Ht 5\' 5"  (1.651 m)   Wt 147 lb (66.7 kg)   LMP  (LMP Unknown)   SpO2 99%   BMI 24.46 kg/m   Affect appropriate Healthy:  appears stated age HEENT: normal Neck supple with no adenopathy JVP normal no bruits no thyromegaly Lungs clear with no wheezing and good diaphragmatic motion Heart:  S1/S2 no murmur, no rub, gallop or click PMI enlarged  Abdomen: benighn, BS positve, no tenderness, no AAA no bruit.  No HSM or HJR Distal pulses intact with no bruits No edema Neuro non-focal Skin warm and dry No muscular weakness   ASSESSMENT & PLAN:    HTN: home readings normal   DCM: EF declined echo 12/17/18 25-30% now on Entresto.  Aldactone started in June 2020 labs ok echo in 6 months She will take aldactone and lasix on alternate days whole tablet so she gets equivalent dose but doesn't need to cut pills in half Will keep middle entresto dose given home readings of systolic in 381-017 mmHg range   PVC" asymptomatic quiescent continue beta blocker   PFO: not noted on distant  TTE not last few   No TIA symptoms no need for f/u at this time   Anxiety/Depression  Stable continue welburin   COVID-19 Education: The signs and symptoms of COVID-19 were discussed with the patient and how to seek care for testing (follow up with PCP or arrange E-visit).  The importance of social distancing was discussed today.  Time:   Today, I have spent 30 minutes with the patient    Medication Adjustments/Labs and Tests Ordered: Current medicines are reviewed at length with the patient today.  Concerns regarding medicines are outlined above.   Tests Ordered:  Echo in 6 months   Medication Changes: No orders of the defined types were  placed in this encounter.   Disposition:  Follow up in 6 months   Signed, Jenkins Rouge, MD  09/25/2019 3:41 PM    [place]

## 2019-09-25 ENCOUNTER — Encounter: Payer: Self-pay | Admitting: Cardiovascular Disease

## 2019-09-25 ENCOUNTER — Other Ambulatory Visit: Payer: Self-pay

## 2019-09-25 ENCOUNTER — Ambulatory Visit: Payer: Medicare Other | Admitting: Cardiovascular Disease

## 2019-09-25 VITALS — BP 156/72 | HR 82 | Ht 65.0 in | Wt 147.0 lb

## 2019-09-25 DIAGNOSIS — I420 Dilated cardiomyopathy: Secondary | ICD-10-CM | POA: Diagnosis not present

## 2019-09-25 MED ORDER — FUROSEMIDE 20 MG PO TABS: 20.0000 mg | ORAL_TABLET | ORAL | 3 refills | Status: DC

## 2019-09-25 MED ORDER — SPIRONOLACTONE 25 MG PO TABS: 25.0000 mg | ORAL_TABLET | ORAL | 3 refills | Status: DC

## 2019-09-25 NOTE — Patient Instructions (Addendum)
Medication Instructions:  Your physician has recommended you make the following change in your medication:  1-TAKE furosemide 20 mg by mouth every other day 2-TAKE aldactone 25 mg by mouth every other day and on days you are not taking Furosemide.  *If you need a refill on your cardiac medications before your next appointment, please call your pharmacy*  Lab Work:  If you have labs (blood work) drawn today and your tests are completely normal, you will receive your results only by: Marland Kitchen MyChart Message (if you have MyChart) OR . A paper copy in the mail If you have any lab test that is abnormal or we need to change your treatment, we will call you to review the results.  Testing/Procedures: Your physician has requested that you have an echocardiogram in 6 months. Echocardiography is a painless test that uses sound waves to create images of your heart. It provides your doctor with information about the size and shape of your heart and how well your heart's chambers and valves are working. This procedure takes approximately one hour. There are no restrictions for this procedure.  Follow-Up: At [facility], you and your health needs are our priority.  As part of our continuing mission to provide you with exceptional heart care, we have created designated Provider Care Teams.  These Care Teams include your primary Cardiologist (physician) and Advanced Practice Providers (APPs -  Physician Assistants and Nurse Practitioners) who all work together to provide you with the care you need, when you need it.  Your next appointment:   6 months  The format for your next appointment:   In Person  Provider:   You may see Jenkins Rouge, MD or one of the following Advanced Practice Providers on your designated Care Team:    Truitt Merle, NP  Cecilie Kicks, NP  Kathyrn Drown, NP

## 2019-10-08 ENCOUNTER — Other Ambulatory Visit: Payer: Self-pay | Admitting: Cardiovascular Disease

## 2019-10-24 ENCOUNTER — Ambulatory Visit: Payer: Medicare Other | Admitting: Cardiovascular Disease

## 2019-11-21 ENCOUNTER — Other Ambulatory Visit: Payer: Self-pay

## 2019-11-21 ENCOUNTER — Ambulatory Visit (INDEPENDENT_AMBULATORY_CARE_PROVIDER_SITE_OTHER): Payer: Medicare PPO

## 2019-11-21 DIAGNOSIS — Z0000 Encounter for general adult medical examination without abnormal findings: Secondary | ICD-10-CM

## 2019-11-21 NOTE — Progress Notes (Signed)
This visit is being conducted via phone call due to the COVID-19 pandemic. This patient has given me verbal consent via phone to conduct this visit, patient states they are participating from their home address. Some vital signs may be absent or patient reported.   Patient identification: identified by name, DOB, and current address.  Location provider: [facility], Office Persons participating in the virtual visit: Denman George LPN, patient, and Dr. Orma Flaming   Subjective:   [name] [name] is a 86 y.o. female who presents for Medicare Annual (Subsequent) preventive examination.  Review of Systems:   Cardiac Risk Factors include: advanced age (>66men, >22 women);hypertension    Objective:     Vitals: LMP  (LMP Unknown)   There is no height or weight on file to calculate BMI.  Advanced Directives 11/21/2019 09/04/2016  Does Patient Have a Medical Advance Directive? Yes Yes  Type of Advance Directive Living will;Healthcare Power of Attorney Living will  Does patient want to make changes to medical advance directive? No - Patient declined No - Patient declined  Copy of Silvana in Chart? No - copy requested No - copy requested    Tobacco Social History   Tobacco Use  Smoking Status Never Smoker  Smokeless Tobacco Never Used     Counseling given: Not Answered   Clinical Intake:  Pre-visit preparation completed: Yes  Pain : No/denies pain  Diabetes: No  How often do you need to have someone help you when you read instructions, pamphlets, or other written materials from your doctor or pharmacy?: 1 - Never  Interpreter Needed?: No  Information entered by :: Denman George LPN  Past Medical History:  Diagnosis Date  . Anemia   . Anxiety and depression 07/16/2009  . ASD (atrial septal defect) per bubble study, 2004   . Cardiomyopathy, normal cath 2007, normal EF per ECHO 2014   . Hx of colonic polyps   . Hypertension   . Osteoarthritis    . Osteopenia   . Podagra 04/29/2014  . RLS (restless legs syndrome) 07/10/2009   Past Surgical History:  Procedure Laterality Date  . BACK SURGERY  2000  . BREAST SURGERY     NEGATIVE BIOPSY  . TONSILLECTOMY     Family History  Problem Relation Age of Onset  . Heart attack Father   . Heart disease Mother   . Hypertension Other   . Diabetes Neg Hx   . Colon cancer Neg Hx    Social History   Socioeconomic History  . Marital status: Widowed    Spouse name: Not on file  . Number of children: 1  . Years of education: Not on file  . Highest education level: Not on file  Occupational History  . Occupation: Engineer, materials: RETIRED  Tobacco Use  . Smoking status: Never Smoker  . Smokeless tobacco: Never Used  Substance and Sexual Activity  . Alcohol use: No    Alcohol/week: 0.0 standard drinks  . Drug use: No  . Sexual activity: Not Currently  Other Topics Concern  . Not on file  Social History Narrative   Widow, moved [place] [facility] [place]. Has a daughter Mateo Flow and a G-d in [place]; lost her son-in-law, moved w/ daughter 2014 due to depression.   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 Difficulty of Paying Living Expenses: Not on [employer]  Insecurity:   . Worried About Programme researcher, broadcasting/film/video in the Last Year: Not on file  . Ran Out of Food in the Last Year: Not on file  Transportation Needs:   . Lack of Transportation (Medical): Not on file  . Lack of Transportation (Non-Medical): Not on file  Physical Activity:   . Days of Exercise per Week: Not on file  . Minutes of Exercise per Session: Not on file  Stress:   . Feeling of Stress : Not on file  Social Connections:   . Frequency of Communication with Friends and Family: Not on file  . Frequency of Social Gatherings with Friends and Family: Not on file  . Attends Religious Services: Not on file  . Active Member of Clubs or Organizations:  Not on file  . Attends Banker Meetings: Not on file  . Marital Status: Not on file    Outpatient Encounter Medications as of 11/21/2019  Medication Sig  . aspirin 81 MG tablet Take 81 mg by mouth daily.  Marland Kitchen buPROPion (WELLBUTRIN XL) 150 MG 24 hr tablet Take 450 mg by mouth every morning.   . carvedilol (COREG) 25 MG tablet Take 1 tablet (25 mg total) by mouth 2 (two) times daily with a meal.  . furosemide (LASIX) 20 MG tablet Take 1 tablet (20 mg total) by mouth every other day.  . latanoprost (XALATAN) 0.005 % ophthalmic solution Place 1 drop into both eyes every evening.  . mirtazapine (REMERON) 30 MG tablet Take 30 mg by mouth at bedtime.   . Multiple Vitamin (MULTIVITAMIN) tablet Take 1 tablet by mouth every morning.   . polyethylene glycol (MIRALAX / GLYCOLAX) packet Take 17 g by mouth 3 (three) times a week. Mon, wed, and fri  . sacubitril-valsartan (ENTRESTO) 49-51 MG Take 1 tablet by mouth 2 (two) times daily.  Marland Kitchen spironolactone (ALDACTONE) 25 MG tablet TAKE 1 TABLET BY MOUTH EVERY OTHER DAY   No facility-administered encounter medications on file as of 11/21/2019.    Activities of Daily Living In your present state of health, do you have any difficulty performing the following activities: 11/21/2019 08/08/2019  Hearing? N N  Vision? N N  Difficulty concentrating or making decisions? N N  Walking or climbing stairs? N N  Dressing or bathing? N N  Doing errands, shopping? N N  Preparing Food and eating ? N -  Using the Toilet? N -  In the past six months, have you accidently leaked urine? N -  Do you have problems with loss of bowel control? N -  Managing your Medications? N -  Managing your Finances? N -  Housekeeping or managing your Housekeeping? N -  Some recent data might be hidden    Patient Care Team: Orland Mustard, MD as PCP - General (Family Medicine) Wendall Stade, MD as PCP - Cardiology (Cardiology) Archer Asa, MD as Consulting Physician  (Psychiatry) Wendall Stade, MD as Consulting Physician (Cardiology) Davina Poke as Consulting Physician (Optometry)    Assessment:   This is a routine wellness examination for [name].  Exercise Activities and Dietary recommendations Current Exercise Habits: Home exercise routine, Type of exercise: yoga;calisthenics, Time (Minutes): 45, Frequency (Times/Week): 4, Weekly Exercise (Minutes/Week): 180, Intensity: Moderate  Goals   None     Fall Risk Fall Risk  11/21/2019 04/28/2019 07/26/2018 04/09/2018 03/20/2017  Falls in the past year? 0 0 No Yes Yes  Number falls in past yr: - 0 - 1 1  Injury with Fall? 0 - - No No  Follow up Falls evaluation completed;Education provided;Falls prevention discussed - - Education provided Falls evaluation completed   Is the patient's home free of loose throw rugs in walkways, pet beds, electrical cords, etc?   yes      Grab bars in the bathroom? yes      Handrails on the stairs?   yes      Adequate lighting?   yes   Depression Screen PHQ 2/9 Scores 11/21/2019 08/08/2019 04/28/2019 01/21/2019  PHQ - 2 Score 0 0 0 0  PHQ- 9 Score - 0 0 0     Cognitive Function- no cognitive concerns at this time    6CIT Screen 11/21/2019  What Year? 0 points  What month? 0 points  What time? 0 points  Count back from 20 0 points  Months in reverse 0 points  Repeat phrase 0 points  Total Score 0    Immunization History  Administered Date(s) Administered  . Influenza Split 09/04/2011, 09/02/2012, 07/18/2015  . Influenza Whole 10/21/2007, 08/03/2008, 08/12/2009, 08/03/2010  . Influenza, High Dose Seasonal PF 07/18/2016, 08/08/2017, 07/08/2018, 08/05/2019  . Influenza,inj,Quad PF,6+ Mos 09/11/2013, 08/15/2014  . Influenza-Unspecified 08/09/2017, 07/08/2018  . Pneumococcal Conjugate-13 03/10/2014  . Pneumococcal Polysaccharide-23 12/11/2006, 10/02/2017  . Td 12/22/2006, 03/20/2017  . Zoster 03/09/2008  . Zoster Recombinat (Shingrix) 01/26/2018, 06/10/2018     Qualifies for Shingles Vaccine? Shingrix completed   Screening Tests Health Maintenance  Topic Date Due  . TETANUS/TDAP  03/21/2027  . INFLUENZA VACCINE  Completed  . DEXA SCAN  Completed  . PNA vac Low Risk Adult  Completed    Cancer Screenings: Lung: Low Dose CT Chest recommended if Age 62-80 years, 30 pack-year currently smoking OR have quit w/in 15years. Patient does not qualify. Breast:  Up to date on Mammogram? Yes   Up to date of Bone Density/Dexa? Yes Colorectal: colonoscopy 08/30/10     Plan:  I have personally reviewed and addressed the Medicare Annual Wellness questionnaire and have noted the following in the patient's chart:  A. Medical and social history B. Use of alcohol, tobacco or illicit drugs  C. Current medications and supplements D. Functional ability and status E.  Nutritional status F.  Physical activity G. Advance directives H. List of other physicians I.  Hospitalizations, surgeries, and ER visits in previous 12 months J.  Vitals K. Screenings such as hearing and vision if needed, cognitive and depression L. Referrals, records requested, and appointments- none   In addition, I have reviewed and discussed with patient certain preventive protocols, quality metrics, and best practice recommendations. A written personalized care plan for preventive services as well as general preventive health recommendations were provided to patient.   Signed,  Kandis Fantasia, LPN  Nurse Health Advisor   Nurse Notes: no additional

## 2019-11-21 NOTE — Patient Instructions (Addendum)
[name] [name] , Thank you for taking time to come for your Medicare Wellness Visit. I appreciate your ongoing commitment to your health goals. Please review the following plan we discussed and let me know if I can assist you in the future.   Screening recommendations/referrals: Colorectal Screening: up to date; last colonoscopy 08/30/10 Mammogram: up to date; last 01/14/19 Bone Density: up to date; last 04/11/18   Vision and Dental Exams: Recommended annual ophthalmology exams for early detection of glaucoma and other disorders of the eye Recommended annual dental exams for proper oral hygiene  Vaccinations: Influenza vaccine: completed 08/05/19 Pneumococcal vaccine: up to date; last 10/02/17  Tdap vaccine: up to date; last 03/20/17  Shingles vaccine: Shingrix vaccine completed   Advanced directives: Please bring a copy of your POA (Power of [place]) and/or Living Will to your next appointment.  Goals: Recommend to drink at least 6-8 8oz glasses of water per day and consume a balanced diet rich in fresh fruits and vegetables.   Next appointment: Please schedule your Annual Wellness Visit with your Nurse Health Advisor in one year.  Preventive Care 76 Years and Older, Female Preventive care refers to lifestyle choices and visits with your health care provider that can promote health and wellness. What does preventive care include?  A yearly physical exam. This is also called an annual well check.  Dental exams once or twice a year.  Routine eye exams. Ask your health care provider how often you should have your eyes checked.  Personal lifestyle choices, including:  Daily care of your teeth and gums.  Regular physical activity.  Eating a healthy diet.  Avoiding tobacco and drug use.  Limiting alcohol use.  Practicing safe sex.  Taking low-dose aspirin every day if recommended by your health care provider.  Taking vitamin and mineral supplements as recommended by your  health care provider. What happens during an annual well check? The services and screenings done by your health care provider during your annual well check will depend on your age, overall health, lifestyle risk factors, and family history of disease. Counseling  Your health care provider may ask you questions about your:  Alcohol use.  Tobacco use.  Drug use.  Emotional well-being.  Home and relationship well-being.  Sexual activity.  Eating habits.  History of falls.  Memory and ability to understand (cognition).  Work and work Astronomer.  Reproductive health. Screening  You may have the following tests or measurements:  Height, weight, and BMI.  Blood pressure.  Lipid and cholesterol levels. These may be checked every 5 years, or more frequently if you are over 51 years old.  Skin check.  Lung cancer screening. You may have this screening every year starting at age 59 if you have a 30-pack-year history of smoking and currently smoke or have quit within the past 15 years.  Fecal occult blood test (FOBT) of the stool. You may have this test every year starting at age 81.  Flexible sigmoidoscopy or colonoscopy. You may have a sigmoidoscopy every 5 years or a colonoscopy every 10 years starting at age 33.  Hepatitis C blood test.  Hepatitis B blood test.  Sexually transmitted disease (STD) testing.  Diabetes screening. This is done by checking your blood sugar (glucose) after you have not eaten for a while (fasting). You may have this done every 1-3 years.  Bone density scan. This is done to screen for osteoporosis. You may have this done starting at age 42.  Mammogram. This  may be done every 1-2 years. Talk to your health care provider about how often you should have regular mammograms. Talk with your health care provider about your test results, treatment options, and if necessary, the need for more tests. Vaccines  Your health care provider may recommend  certain vaccines, such as:  Influenza vaccine. This is recommended every year.  Tetanus, diphtheria, and acellular pertussis (Tdap, Td) vaccine. You may need a Td booster every 10 years.  Zoster vaccine. You may need this after age 51.  Pneumococcal 13-valent conjugate (PCV13) vaccine. One dose is recommended after age 11.  Pneumococcal polysaccharide (PPSV23) vaccine. One dose is recommended after age 64. Talk to your health care provider about which screenings and vaccines you need and how often you need them. This information is not intended to replace advice given to you by your health care provider. Make sure you discuss any questions you have with your health care provider. Document Released: 11/26/2015 Document Revised: 07/19/2016 Document Reviewed: 08/31/2015 Elsevier Interactive Patient Education  2017 [employer] Prevention in the Home Falls can cause injuries. They can happen to people of all ages. There are many things you can do to make your home safe and to help prevent falls. What can I do on the outside of my home?  Regularly fix the edges of walkways and driveways and fix any cracks.  Remove anything that might make you trip as you walk through a door, such as a raised step or threshold.  Trim any bushes or trees on the path to your home.  Use bright outdoor lighting.  Clear any walking paths of anything that might make someone trip, such as rocks or tools.  Regularly check to see if handrails are loose or broken. Make sure that both sides of any steps have handrails.  Any raised decks and porches should have guardrails on the edges.  Have any leaves, snow, or ice cleared regularly.  Use sand or salt on walking paths during winter.  Clean up any spills in your garage right away. This includes oil or grease spills. What can I do in the bathroom?  Use night lights.  Install grab bars by the toilet and in the tub and shower. Do not use towel bars as  grab bars.  Use non-skid mats or decals in the tub or shower.  If you need to sit down in the shower, use a plastic, non-slip stool.  Keep the floor dry. Clean up any water that spills on the floor as soon as it happens.  Remove soap buildup in the tub or shower regularly.  Attach bath mats securely with double-sided non-slip rug tape.  Do not have throw rugs and other things on the floor that can make you trip. What can I do in the bedroom?  Use night lights.  Make sure that you have a light by your bed that is easy to reach.  Do not use any sheets or blankets that are too big for your bed. They should not hang down onto the floor.  Have a firm chair that has side arms. You can use this for support while you get dressed.  Do not have throw rugs and other things on the floor that can make you trip. What can I do in the kitchen?  Clean up any spills right away.  Avoid walking on wet floors.  Keep items that you use a lot in easy-to-reach places.  If you need to reach something above  you, use a strong step stool that has a grab bar.  Keep electrical cords out of the way.  Do not use floor polish or wax that makes floors slippery. If you must use wax, use non-skid floor wax.  Do not have throw rugs and other things on the floor that can make you trip. What can I do with my stairs?  Do not leave any items on the stairs.  Make sure that there are handrails on both sides of the stairs and use them. Fix handrails that are broken or loose. Make sure that handrails are as long as the stairways.  Check any carpeting to make sure that it is firmly attached to the stairs. Fix any carpet that is loose or worn.  Avoid having throw rugs at the top or bottom of the stairs. If you do have throw rugs, attach them to the floor with carpet tape.  Make sure that you have a light switch at the top of the stairs and the bottom of the stairs. If you do not have them, ask someone to add them  for you. What else can I do to help prevent falls?  Wear shoes that:  Do not have high heels.  Have rubber bottoms.  Are comfortable and fit you well.  Are closed at the toe. Do not wear sandals.  If you use a stepladder:  Make sure that it is fully opened. Do not climb a closed stepladder.  Make sure that both sides of the stepladder are locked into place.  Ask someone to hold it for you, if possible.  Clearly mark and make sure that you can see:  Any grab bars or handrails.  First and last steps.  Where the edge of each step is.  Use tools that help you move around (mobility aids) if they are needed. These include:  Canes.  Walkers.  Scooters.  Crutches.  Turn on the lights when you go into a dark area. Replace any light bulbs as soon as they burn out.  Set up your furniture so you have a clear path. Avoid moving your furniture around.  If any of your floors are uneven, fix them.  If there are any pets around you, be aware of where they are.  Review your medicines with your doctor. Some medicines can make you feel dizzy. This can increase your chance of falling. Ask your doctor what other things that you can do to help prevent falls. This information is not intended to replace advice given to you by your health care provider. Make sure you discuss any questions you have with your health care provider. Document Released: 08/26/2009 Document Revised: 04/06/2016 Document Reviewed: 12/04/2014 Elsevier Interactive Patient Education  2017 [employer].

## 2019-12-02 ENCOUNTER — Telehealth: Payer: Self-pay

## 2019-12-02 NOTE — Telephone Encounter (Signed)
error 

## 2019-12-03 ENCOUNTER — Telehealth: Payer: Self-pay | Admitting: Family Medicine

## 2019-12-03 NOTE — Telephone Encounter (Signed)
Patient says she will be signing up for the covid vaccine soon and she would like to know Dr.Wolfe's opinion on if she should get it or not due to all of her allergies

## 2019-12-04 NOTE — Telephone Encounter (Signed)
Called patient gave information and sent via my chart per patient.

## 2019-12-04 NOTE — Telephone Encounter (Signed)
Please let her know that contraindication to covid vaccine would be if allergy to any ingredients in vaccine which she will need to go over at appointment. Caution advised if anaphylactic reaction to other vaccines in the past. I can't give her more information than that as their are a lot of unknowns with this shot and it's weighing risk over benefits.  Orland Mustard, MD [facility]

## 2019-12-09 ENCOUNTER — Telehealth: Payer: Self-pay

## 2019-12-09 NOTE — Telephone Encounter (Signed)
Called patient back and told her to get vaccine.  We are recommending the COVID-19 vaccine to all of our patients. Cardiac medications (including blood thinners) should not deter anyone from being vaccinated and there is no need to hold any of those medications prior to vaccine administration.  Currently, there is a hotline to call (active 11/21/19) to schedule vaccination appointments as no walk-ins will be accepted.  Number: [phone]  If you have further questions or concerns about the vaccine process, please visit [email or web address] or contact your primary care physician.

## 2019-12-09 NOTE — Telephone Encounter (Signed)
Pt would like for you to call her after 3:30pm today or anytime tomorrow. She wants you to go over her medications to make sure it's safe for her to get the Covid vaccine.

## 2019-12-12 ENCOUNTER — Other Ambulatory Visit: Payer: Self-pay | Admitting: Family Medicine

## 2019-12-12 ENCOUNTER — Other Ambulatory Visit: Payer: Self-pay | Admitting: Cardiovascular Disease

## 2019-12-12 DIAGNOSIS — I10 Essential (primary) hypertension: Secondary | ICD-10-CM

## 2019-12-16 ENCOUNTER — Other Ambulatory Visit: Payer: Self-pay | Admitting: Family Medicine

## 2019-12-16 DIAGNOSIS — I10 Essential (primary) hypertension: Secondary | ICD-10-CM

## 2020-01-06 DIAGNOSIS — F3340 Major depressive disorder, recurrent, in remission, unspecified: Secondary | ICD-10-CM | POA: Diagnosis not present

## 2020-01-07 ENCOUNTER — Other Ambulatory Visit: Payer: Self-pay | Admitting: Family Medicine

## 2020-01-07 DIAGNOSIS — I10 Essential (primary) hypertension: Secondary | ICD-10-CM

## 2020-01-14 ENCOUNTER — Encounter: Payer: Self-pay | Admitting: Family Medicine

## 2020-01-22 ENCOUNTER — Other Ambulatory Visit: Payer: Self-pay | Admitting: Family Medicine

## 2020-01-22 DIAGNOSIS — I10 Essential (primary) hypertension: Secondary | ICD-10-CM

## 2020-01-22 NOTE — Telephone Encounter (Signed)
Please review

## 2020-02-20 ENCOUNTER — Ambulatory Visit (INDEPENDENT_AMBULATORY_CARE_PROVIDER_SITE_OTHER): Payer: Medicare PPO | Admitting: Family Medicine

## 2020-02-20 ENCOUNTER — Other Ambulatory Visit: Payer: Self-pay

## 2020-02-20 ENCOUNTER — Encounter: Payer: Self-pay | Admitting: Family Medicine

## 2020-02-20 VITALS — BP 124/80 | HR 72 | Temp 97.7°F | Ht 65.0 in | Wt 146.6 lb

## 2020-02-20 DIAGNOSIS — R7303 Prediabetes: Secondary | ICD-10-CM | POA: Diagnosis not present

## 2020-02-20 DIAGNOSIS — H6122 Impacted cerumen, left ear: Secondary | ICD-10-CM | POA: Diagnosis not present

## 2020-02-20 DIAGNOSIS — F339 Major depressive disorder, recurrent, unspecified: Secondary | ICD-10-CM | POA: Insufficient documentation

## 2020-02-20 DIAGNOSIS — I10 Essential (primary) hypertension: Secondary | ICD-10-CM

## 2020-02-20 LAB — COMPREHENSIVE METABOLIC PANEL
ALT: 7 U/L (ref 0–35)
AST: 15 U/L (ref 0–37)
Albumin: 4.4 g/dL (ref 3.5–5.2)
Alkaline Phosphatase: 49 U/L (ref 39–117)
BUN: 30 mg/dL — ABNORMAL HIGH (ref 6–23)
CO2: 24 mEq/L (ref 19–32)
Calcium: 9.7 mg/dL (ref 8.4–10.5)
Chloride: 98 mEq/L (ref 96–112)
Creatinine, Ser: 1.46 mg/dL — ABNORMAL HIGH (ref 0.40–1.20)
GFR: 40.66 mL/min — ABNORMAL LOW (ref >60.00)
Glucose, Bld: 88 mg/dL (ref 70–99)
Potassium: 4.8 mEq/L (ref 3.5–5.1)
Sodium: 132 mEq/L — ABNORMAL LOW (ref 135–145)
Total Bilirubin: 0.4 mg/dL (ref 0.2–1.2)
Total Protein: 6.6 g/dL (ref 6.0–8.3)

## 2020-02-20 LAB — CBC WITH DIFFERENTIAL/PLATELET
Basophils Absolute: 0.1 10*3/uL (ref 0.0–0.1)
Basophils Relative: 1.3 % (ref 0.0–3.0)
Eosinophils Absolute: 0.1 10*3/uL (ref 0.0–0.7)
Eosinophils Relative: 2 % (ref 0.0–5.0)
HCT: 34.2 % — ABNORMAL LOW (ref 36.0–46.0)
Hemoglobin: 11.8 g/dL — ABNORMAL LOW (ref 12.0–15.0)
Lymphocytes Relative: 39.4 % (ref 12.0–46.0)
Lymphs Abs: 2 10*3/uL (ref 0.7–4.0)
MCHC: 34.5 g/dL (ref 30.0–36.0)
MCV: 99.6 fl (ref 78.0–100.0)
Monocytes Absolute: 0.6 10*3/uL (ref 0.1–1.0)
Monocytes Relative: 12.4 % — ABNORMAL HIGH (ref 3.0–12.0)
Neutro Abs: 2.3 10*3/uL (ref 1.4–7.7)
Neutrophils Relative %: 44.9 % (ref 43.0–77.0)
Platelets: 217 10*3/uL (ref 150.0–400.0)
RBC: 3.43 Mil/uL — ABNORMAL LOW (ref 3.87–5.11)
RDW: 13.9 % (ref 11.5–15.5)
WBC: 5 10*3/uL (ref 4.0–10.5)

## 2020-02-20 LAB — HEMOGLOBIN A1C: Hgb A1c MFr Bld: 5.9 % (ref 4.6–6.5)

## 2020-02-20 NOTE — Progress Notes (Addendum)
Patient: [name] [name] MRN: [id number] DOB: 10-23-1927 PCP: Orland Mustard, MD     Subjective:  Chief Complaint  Patient presents with  . Hypertension  . Prediabetes    HPI: The patient is a 86 y.o. female who presents today for Hypertension and prediabetes.   Hypertension: Here for follow up of hypertension.  Currently on coreg 25mg  bid, laix 20mg  every other day, entresto 49-51mg . Also on aldactone for her heart failure.  . Home readings range from 120-126 systolic/80-85 diastolic. Takes medication as prescribed and denies any side effects. Exercise includes walking, online exercise and yoga 2x/week. Weight has been stable. Denies any chest pain, headaches, shortness of breath, vision changes, swelling in lower extremities.   Prediabetes: extremely well controlled.   Ear fullness: wears hearing aides and would like me to see if her ears need cleaned out.   She has had both of her covid vaccines.   Review of Systems  Constitutional: Negative for chills, fatigue and fever.  HENT: Negative for dental problem, ear discharge, ear pain, hearing loss and trouble swallowing.        Left ear feels muffled  Eyes: Negative for visual disturbance.  Respiratory: Negative for cough, chest tightness and shortness of breath.   Cardiovascular: Negative for chest pain, palpitations and leg swelling.  Gastrointestinal: Negative for abdominal pain, blood in stool, diarrhea and nausea.  Endocrine: Negative for cold intolerance, polydipsia, polyphagia and polyuria.  Genitourinary: Negative for dysuria and hematuria.  Musculoskeletal: Negative for arthralgias.  Skin: Negative for rash.  Neurological: Negative for dizziness and headaches.  Psychiatric/Behavioral: Negative for dysphoric mood and sleep disturbance. The patient is not nervous/anxious.     Allergies Patient is allergic to shellfish allergy; codeine; hydrocodone; and adhesive [tape].  Past Medical History Patient  has a past  medical history of Anemia, Anxiety and depression (07/16/2009), ASD (atrial septal defect) per bubble study, 2004, Cardiomyopathy, normal cath 2007, normal EF per ECHO 2014, colonic polyps, Hypertension, Osteoarthritis, Osteopenia, Podagra (04/29/2014), and RLS (restless legs syndrome) (07/10/2009).  Surgical History Patient  has a past surgical history that includes Tonsillectomy; Back surgery (2000); and Breast surgery.  Family History Pateint's family history includes Heart attack in her father; Heart disease in her mother; Hypertension in an other family member.  Social History Patient  reports that she has never smoked. She has never used smokeless tobacco. She reports that she does not drink alcohol or use drugs.    Objective: Vitals:   02/20/20 1345  BP: 124/80  Pulse: 72  Temp: 97.7 F (36.5 C)  TempSrc: Temporal  SpO2: 99%  Weight: 146 lb 9.6 oz (66.5 kg)  Height: 5\' 5"  (1.651 m)    Body mass index is 24.4 kg/m.  Physical Exam Vitals reviewed.  Constitutional:      Appearance: Normal appearance. She is well-developed and normal weight.  HENT:     Head: Normocephalic and atraumatic.     Right Ear: Tympanic membrane, ear canal and external ear normal.     Left Ear: External ear normal. There is impacted cerumen.     Ears:     Comments: After lavage, TM wnl.     Mouth/Throat:     Mouth: Mucous membranes are moist.  Eyes:     Extraocular Movements: Extraocular movements intact.     Conjunctiva/sclera: Conjunctivae normal.     Pupils: Pupils are equal, round, and reactive to light.  Neck:     Thyroid: No thyromegaly.  Cardiovascular:     Rate  and Rhythm: Normal rate and regular rhythm.     Pulses: Normal pulses.     Heart sounds: Normal heart sounds. No murmur.     Comments: Trace Pedal edema  Pulmonary:     Effort: Pulmonary effort is normal.     Breath sounds: Normal breath sounds.  Abdominal:     General: Abdomen is flat. Bowel sounds are normal. There is no  distension.     Palpations: Abdomen is soft.     Tenderness: There is no abdominal tenderness.  Musculoskeletal:     Cervical back: Normal range of motion and neck supple.  Lymphadenopathy:     Cervical: No cervical adenopathy.  Skin:    General: Skin is warm and dry.     Capillary Refill: Capillary refill takes less than 2 seconds.     Findings: No rash.  Neurological:     General: No focal deficit present.     Mental Status: She is alert and oriented to person, place, and time.     Cranial Nerves: No cranial nerve deficit.     Coordination: Coordination normal.     Deep Tendon Reflexes: Reflexes normal.  Psychiatric:        Mood and Affect: Mood normal.        Behavior: Behavior normal.          Office Visit from 02/20/2020 in [place]  PHQ-2 Total Score  0     Ceruminosis is noted.  Verbal consent obtained by patient for lavage. Wax is removed by syringing and manual debridement. Instructions for home care to prevent wax buildup are given. TM visualized and wnl. She tolerated procedure well.    Assessment/plan: 1. Essential hypertension Blood pressure is to goal. Continue current anti-hypertensive medications. Refills not given and routine lab work will be done today. Recommended routine exercise and healthy diet including DASH diet and mediterranean diet. Encouraged weight loss. F/u in 6 months for fasting labs.   - CBC with Differential/Platelet - Comprehensive metabolic panel  2. Prediabetes  - Hemoglobin A1c  3. Impacted cerumen of left ear Lavaged, clear and TM normal. information given on debrox.     This visit occurred during the SARS-CoV-2 public health emergency.  Safety protocols were in place, including screening questions prior to the visit, additional usage of staff PPE, and extensive cleaning of exam room while observing appropriate contact time as indicated for disinfecting solutions.     Return in about 6 months (around  08/21/2020) for htn/prediabetes/fasting labs. Orma Flaming, MD [place]   02/20/2020

## 2020-02-23 ENCOUNTER — Encounter: Payer: Self-pay | Admitting: Family Medicine

## 2020-02-25 ENCOUNTER — Telehealth: Payer: Self-pay | Admitting: Cardiovascular Disease

## 2020-02-25 NOTE — Telephone Encounter (Signed)
Have already messaged Dr Artis Flock

## 2020-02-25 NOTE — Telephone Encounter (Signed)
Patient calling stating she had lab work done and was told by her PCP her renal function is getting worse. She would like to know if Dr. Eden Emms could read the results. She also states her PCP discussed her having a water limit. She states she is confused and concerned and would like to know what her water limit should be. Please advise.

## 2020-02-25 NOTE — Telephone Encounter (Signed)
Will forward to Dr. Eden Emms. Please see mychart message between Dr. Artis Flock and patient.

## 2020-03-03 ENCOUNTER — Encounter: Payer: Self-pay | Admitting: Family Medicine

## 2020-03-03 DIAGNOSIS — Z1231 Encounter for screening mammogram for malignant neoplasm of breast: Secondary | ICD-10-CM | POA: Diagnosis not present

## 2020-03-04 ENCOUNTER — Telehealth: Payer: Self-pay | Admitting: Cardiovascular Disease

## 2020-03-04 NOTE — Telephone Encounter (Signed)
New Message  Pt called and wanted someone to clarify her results   Please call to discuss

## 2020-03-04 NOTE — Telephone Encounter (Addendum)
Patient is concerned about her fluid intake and wanted to know if Dr. Eden Emms had seen her lab work. Informed patient that Dr. Eden Emms had been in contact with Dr. Artis Flock. Since patient is soon due for an office visit made patient an appointment with Dr. Eden Emms to put her mind at ease. Encouraged patient to not drink so much water and to count all liquids when she is trying to keep an eye on her intake. Patient verbalized understanding and will keep her appointment for Monday.

## 2020-03-05 NOTE — Progress Notes (Signed)
Date:  03/08/2020   ID:  [name] [name], [name] [name] 04-21-1927, MRN [id number]  Provider Location: Office  PCP:  Orma Flaming, MD  Cardiologist:  Jenkins Rouge, MD   Electrophysiologist:  None   Evaluation Performed:  Follow-Up Visit  Chief Complaint:  Chronic Systolic CHF  History of Present Illness:    86 y.o. with history of presumed long standing non ischemic DCM. Normal cath back in 2007. Has been functional class 2 for age. Echo  03/15/17 EF  35% with mild MR and estimated PA systolic pressure of 43 mmHg. She has history of asymptomatic PVC;s. As well as restless legs, HTN, anemia and anxiety / depression   No edema, PND, orthopnea.  Lives with daughter Still driving.  No chest pain palpitations or syncope  She is swimming at [employer] to finish a memoir about the town of [place]   TTE reviewed from 12/17/18 EF 35-32% grade 2 diastolic estimated PA pressure 63 mmhg midl to moderate MR At thiat time started on Entresto. Dose titrated Started On Aldactone June 2020    Lives with only daughter and has 4 grand children   Labs 02/20/20 Na 132 K 4.8 Cr 1.46 5 months ago was 1.29   Reassured her about her labs and told her it was because she is being Rx with two diuretics for her CHF Told her to drink when she's thirsty and not otherwise     The patient does not have symptoms concerning for COVID-19 infection (fever, chills, cough, or new shortness of breath).    Past Medical History:  Diagnosis Date  . Anemia   . Anxiety and depression 07/16/2009  . ASD (atrial septal defect) per bubble study, 2004   . Cardiomyopathy, normal cath 2007, normal EF per ECHO 2014   . Hx of colonic polyps   . Hypertension   . Osteoarthritis   . Osteopenia   . Podagra 04/29/2014  . RLS (restless legs syndrome) 07/10/2009   Past Surgical History:  Procedure Laterality Date  . BACK SURGERY  2000  . BREAST SURGERY     NEGATIVE BIOPSY  . TONSILLECTOMY       No outpatient  medications have been marked as taking for the 03/08/20 encounter (Office Visit) with Josue Hector, MD.     Allergies:   Shellfish allergy, Codeine, Hydrocodone, and Adhesive [tape]   Social History   Tobacco Use  . Smoking status: Never Smoker  . Smokeless tobacco: Never Used  Substance Use Topics  . Alcohol use: No    Alcohol/week: 0.0 standard drinks  . Drug use: No     Family Hx: The patient's family history includes Heart attack in her father; Heart disease in her mother; Hypertension in an other family member. There is no history of Diabetes or Colon cancer.  ROS:   Please see the history of present illness.     All other systems reviewed and are negative.   Prior CV studies:   The following studies were reviewed today:  Echo 12/17/18  Labs/Other Tests and Data Reviewed:    EKG:   SR rate 85 PVC nonspecific ST changes 12/03/18 03/08/20 SR rate 76 isolated PVC   Recent Labs: 02/20/2020: ALT 7; BUN 30; Creatinine, Ser 1.46; Hemoglobin 11.8; Platelets 217.0; Potassium 4.8; Sodium 132   Recent Lipid Panel Lab Results  Component Value Date/Time   CHOL 192 09/12/2019 08:50 AM   TRIG 81.0 09/12/2019 08:50 AM   HDL 54.00 09/12/2019 08:50 AM  CHOLHDL 4 09/12/2019 08:50 AM   LDLCALC 122 (H) 09/12/2019 08:50 AM   LDLDIRECT 116.5 11/08/2012 09:35 AM    Wt Readings from Last 3 Encounters:  03/08/20 144 lb (65.3 kg)  02/20/20 146 lb 9.6 oz (66.5 kg)  09/25/19 147 lb (66.7 kg)     Objective:    Vital Signs:  BP (!) 142/92   Pulse 76   Ht 5\' 5"  (1.651 m)   Wt 144 lb (65.3 kg)   LMP  (LMP Unknown)   SpO2 98%   BMI 23.96 kg/m  My reading after 15 minutes 138/80 mmHg   Affect appropriate Healthy:  appears stated age HEENT: normal Neck supple with no adenopathy JVP normal no bruits no thyromegaly Lungs clear with no wheezing and good diaphragmatic motion Heart:  S1/S2 no murmur, no rub, gallop or click PMI enlarged  Abdomen: benighn, BS positve, no tenderness,  no AAA no bruit.  No HSM or HJR Distal pulses intact with no bruits No edema Neuro non-focal Skin warm and dry No muscular weakness   ASSESSMENT & PLAN:    HTN: home readings normal   DCM: EF declined echo 12/17/18 25-30% now on Entresto.  Aldactone started in June 2020 I don't think her Cr has changed that much and would continue her current diuretic dosing f/u with primary for BMET   PVC" asymptomatic quiescent continue beta blocker   PFO: not noted on distant  TTE not last few   No TIA symptoms no need for f/u at this time   Anxiety/Depression  Stable continue welburin   COVID-19 Education: The signs and symptoms of COVID-19 were discussed with the patient and how to seek care for testing (follow up with PCP or arrange E-visit).  The importance of social distancing was discussed today.  Time:   Today, I have spent 30 minutes with the patient    Medication Adjustments/Labs and Tests Ordered: Current medicines are reviewed at length with the patient today.  Concerns regarding medicines are outlined above.   Tests Ordered:  None   Medication Changes: No orders of the defined types were placed in this encounter.   Disposition:  Follow up in 6 months   Signed, July 2020, MD  03/08/2020 11:18 AM    [facility]

## 2020-03-08 ENCOUNTER — Ambulatory Visit: Payer: Medicare PPO | Admitting: Cardiovascular Disease

## 2020-03-08 ENCOUNTER — Encounter: Payer: Self-pay | Admitting: Family Medicine

## 2020-03-08 ENCOUNTER — Other Ambulatory Visit: Payer: Self-pay

## 2020-03-08 ENCOUNTER — Encounter: Payer: Self-pay | Admitting: Cardiovascular Disease

## 2020-03-08 VITALS — BP 142/92 | HR 76 | Ht 65.0 in | Wt 144.0 lb

## 2020-03-08 DIAGNOSIS — I420 Dilated cardiomyopathy: Secondary | ICD-10-CM | POA: Diagnosis not present

## 2020-03-08 NOTE — Patient Instructions (Addendum)
Medication Instructions:  *If you need a refill on your cardiac medications before your next appointment, please call your pharmacy*  Lab Work: If you have labs (blood work) drawn today and your tests are completely normal, you will receive your results only by: . MyChart Message (if you have MyChart) OR . A paper copy in the mail If you have any lab test that is abnormal or we need to change your treatment, we will call you to review the results.  Follow-Up: At [facility], you and your health needs are our priority.  As part of our continuing mission to provide you with exceptional heart care, we have created designated Provider Care Teams.  These Care Teams include your primary Cardiologist (physician) and Advanced Practice Providers (APPs -  Physician Assistants and Nurse Practitioners) who all work together to provide you with the care you need, when you need it.  We recommend signing up for the patient portal called "MyChart".  Sign up information is provided on this After Visit Summary.  MyChart is used to connect with patients for Virtual Visits (Telemedicine).  Patients are able to view lab/test results, encounter notes, upcoming appointments, etc.  Non-urgent messages can be sent to your provider as well.   To learn more about what you can do with MyChart, go to [email or web address].    Your next appointment:   6 month(s)  The format for your next appointment:   In Person  Provider:   You may see Peter Nishan, MD or one of the following Advanced Practice Providers on your designated Care Team:    Lori Gerhardt, NP  [name] Ingold, NP  Jill McDaniel, NP    

## 2020-03-10 ENCOUNTER — Encounter: Payer: Self-pay | Admitting: Family Medicine

## 2020-04-16 ENCOUNTER — Other Ambulatory Visit (HOSPITAL_COMMUNITY): Payer: Medicare PPO

## 2020-04-21 ENCOUNTER — Ambulatory Visit (HOSPITAL_COMMUNITY): Payer: Medicare PPO | Attending: Cardiology

## 2020-04-21 ENCOUNTER — Other Ambulatory Visit: Payer: Self-pay

## 2020-04-21 DIAGNOSIS — I420 Dilated cardiomyopathy: Secondary | ICD-10-CM | POA: Insufficient documentation

## 2020-04-26 DIAGNOSIS — H401131 Primary open-angle glaucoma, bilateral, mild stage: Secondary | ICD-10-CM | POA: Diagnosis not present

## 2020-06-03 ENCOUNTER — Other Ambulatory Visit: Payer: Self-pay | Admitting: Cardiovascular Disease

## 2020-07-13 DIAGNOSIS — F3340 Major depressive disorder, recurrent, in remission, unspecified: Secondary | ICD-10-CM | POA: Diagnosis not present

## 2020-07-14 ENCOUNTER — Other Ambulatory Visit: Payer: Self-pay | Admitting: Family Medicine

## 2020-07-14 DIAGNOSIS — I10 Essential (primary) hypertension: Secondary | ICD-10-CM

## 2020-08-23 ENCOUNTER — Other Ambulatory Visit: Payer: Self-pay

## 2020-08-23 ENCOUNTER — Encounter: Payer: Self-pay | Admitting: Family Medicine

## 2020-08-23 ENCOUNTER — Ambulatory Visit: Payer: Medicare PPO | Admitting: Family Medicine

## 2020-08-23 VITALS — BP 160/80 | HR 78 | Temp 97.6°F | Ht 65.0 in | Wt 145.0 lb

## 2020-08-23 DIAGNOSIS — F32A Depression, unspecified: Secondary | ICD-10-CM

## 2020-08-23 DIAGNOSIS — F419 Anxiety disorder, unspecified: Secondary | ICD-10-CM | POA: Diagnosis not present

## 2020-08-23 DIAGNOSIS — R7303 Prediabetes: Secondary | ICD-10-CM | POA: Diagnosis not present

## 2020-08-23 DIAGNOSIS — H6123 Impacted cerumen, bilateral: Secondary | ICD-10-CM

## 2020-08-23 DIAGNOSIS — I10 Essential (primary) hypertension: Secondary | ICD-10-CM

## 2020-08-23 DIAGNOSIS — E7849 Other hyperlipidemia: Secondary | ICD-10-CM

## 2020-08-23 DIAGNOSIS — R350 Frequency of micturition: Secondary | ICD-10-CM

## 2020-08-23 LAB — POCT URINALYSIS DIPSTICK
Bilirubin, UA: NEGATIVE
Blood, UA: POSITIVE
Glucose, UA: NEGATIVE
Ketones, UA: NEGATIVE
Leukocytes, UA: NEGATIVE
Nitrite, UA: NEGATIVE
Protein, UA: NEGATIVE
Spec Grav, UA: 1.01 (ref 1.010–1.025)
Urobilinogen, UA: 0.2 E.U./dL
pH, UA: 6 (ref 5.0–8.0)

## 2020-08-23 NOTE — Progress Notes (Signed)
Patient: [name] [name] MRN: [id number] DOB: 1927/06/17 PCP: Orland Mustard, MD     Subjective:  Chief Complaint  Patient presents with  . Hypertension  . Prediabetes  . Ear Fullness    HPI: The patient is a 86 y.o. female who presents today for HTN, Pre DM, and fasting labs. She has questions about water intake. How what is normal for her age.  Pt is concerned about urinary frequency at night. She is also interested in the flu vaccine.  Hypertension: Here for follow up of hypertension.  Currently on crestor 25mg  Bid, lasix 20mg  ever other day and entresto 59-51mg  daily. Home reading: does not take at home. Takes medication as prescribed and denies any side effects. Exercise includes working out 5x week. Weight has been stable. Denies any chest pain, headaches, shortness of breath, vision changes, swelling in lower extremities. Appears euvolemic.   Prediabetes Last a1c was 5.9. due for labs today. Diet controlled.   Ear fullness Thinks she needs her ears cleaned out.   Urinary frequency Has to go to bathroom at night a few times. Denies any urgency or blood in her urine. No foul smell or color change. She is on 2 diuretics.    Had her covid booster this past Saturday.   Review of Systems  Constitutional: Negative for chills, fatigue and fever.  HENT: Negative for congestion, dental problem, ear pain, hearing loss, sore throat and trouble swallowing.   Eyes: Negative for visual disturbance.  Respiratory: Negative for cough, chest tightness, shortness of breath and wheezing.   Cardiovascular: Negative for chest pain, palpitations and leg swelling.  Gastrointestinal: Negative for abdominal pain, blood in stool, diarrhea and nausea.  Endocrine: Negative for cold intolerance, polydipsia, polyphagia and polyuria.  Genitourinary: Positive for frequency. Negative for dysuria, hematuria and pelvic pain.  Musculoskeletal: Negative for arthralgias.  Skin: Negative for rash.   Neurological: Negative for dizziness and headaches.  Psychiatric/Behavioral: Negative for dysphoric mood and sleep disturbance. The patient is not nervous/anxious.     Allergies Patient is allergic to shellfish allergy, codeine, hydrocodone, and adhesive [tape].  Past Medical History Patient  has a past medical history of Anemia, Anxiety and depression (07/16/2009), ASD (atrial septal defect) per bubble study, 2004, Cardiomyopathy, normal cath 2007, normal EF per ECHO 2014, colonic polyps, Hypertension, Osteoarthritis, Osteopenia, Podagra (04/29/2014), and RLS (restless legs syndrome) (07/10/2009).  Surgical History Patient  has a past surgical history that includes Tonsillectomy; Back surgery (2000); and Breast surgery.  Family History Pateint's family history includes Heart attack in her father; Heart disease in her mother; Hypertension in an other family member.  Social History Patient  reports that she has never smoked. She has never used smokeless tobacco. She reports that she does not drink alcohol and does not use drugs.    Objective: Vitals:   08/23/20 1328 08/23/20 1401  BP: (!) 162/88 (!) 160/80  Pulse: 78   Temp: 97.6 F (36.4 C)   TempSrc: Temporal   SpO2: 100%   Weight: 145 lb (65.8 kg)   Height: 5\' 5"  (1.651 m)     Body mass index is 24.13 kg/m.  Physical Exam Vitals reviewed.  Constitutional:      Appearance: Normal appearance. She is well-developed and normal weight.  HENT:     Head: Normocephalic and atraumatic.     Right Ear: Ear canal and external ear normal. There is impacted cerumen.     Left Ear: Ear canal and external ear normal. There is impacted cerumen.  Eyes:     Extraocular Movements: Extraocular movements intact.     Conjunctiva/sclera: Conjunctivae normal.     Pupils: Pupils are equal, round, and reactive to light.  Neck:     Thyroid: No thyromegaly.     Vascular: No carotid bruit.  Cardiovascular:     Rate and Rhythm: Normal rate and  regular rhythm.     Heart sounds: Normal heart sounds. No murmur heard.   Pulmonary:     Effort: Pulmonary effort is normal.     Breath sounds: Normal breath sounds.  Abdominal:     General: Abdomen is flat. Bowel sounds are normal. There is no distension.     Palpations: Abdomen is soft.     Tenderness: There is no abdominal tenderness.  Musculoskeletal:     Cervical back: Normal range of motion and neck supple.  Lymphadenopathy:     Cervical: No cervical adenopathy.  Skin:    General: Skin is warm and dry.     Capillary Refill: Capillary refill takes less than 2 seconds.     Findings: No rash.  Neurological:     General: No focal deficit present.     Mental Status: She is alert and oriented to person, place, and time.     Cranial Nerves: No cranial nerve deficit.     Coordination: Coordination normal.     Deep Tendon Reflexes: Reflexes normal.  Psychiatric:        Mood and Affect: Mood normal.        Behavior: Behavior normal.          Office Visit from 02/20/2020 in [facility]  PHQ-2 Total Score 0     Verbal consent obtained. Ceruminosis is noted.  Wax is removed by syringing and manual debridement. Instructions for home care to prevent wax buildup are given. Left TM wnl, right tM still partially obstructed.   Assessment/plan: 1. Prediabetes Well controlled on diet alone.  a1c today utd on HM  Follow up in 6 months.  - Hemoglobin A1c  2. Primary hypertension Above goal today. Has been well controlled. Asked her to start a log as she has follow up wit her cardiologist in 1-2 months. She is to call me if blood pressure consistently elevated . Routine lab work today.  - CBC with Differential/Platelet - COMPLETE METABOLIC PANEL WITH GFR; Future  3. Anxiety and depression Well controlled on her wellbutrin.   4. Urinary frequency Likely secondary to her 2 diuretics, but will check ua/culture.  - Urine Culture - POCT urinalysis dipstick  5.  Other hyperlipidemia  - Lipid panel; Future  6. Impacted cerumen of both ears debrox information given. May need to return to irrigate right ear some more.      This visit occurred during the SARS-CoV-2 public health emergency.  Safety protocols were in place, including screening questions prior to the visit, additional usage of staff PPE, and extensive cleaning of exam room while observing appropriate contact time as indicated for disinfecting solutions.     Return in about 6 months (around 02/21/2021) for prediabeets/htn .   Orland Mustard, MD [facility]   08/23/2020

## 2020-08-23 NOTE — Patient Instructions (Signed)
-  ask your cardiologist about a number for water if this is helpful to you.   -debrox is over the counter to help with ear wax build up.   -checking all of your labs.   -blood pressure is above goal. Keep a record of your readings at home so you can take to your cardiologist.   See you back in 6 months !  Dr. Artis Flock

## 2020-08-24 LAB — COMPLETE METABOLIC PANEL WITH GFR
AG Ratio: 1.9 (calc) (ref 1.0–2.5)
ALT: 9 U/L (ref 6–29)
AST: 17 U/L (ref 10–35)
Albumin: 4.4 g/dL (ref 3.6–5.1)
Alkaline phosphatase (APISO): 45 U/L (ref 37–153)
BUN/Creatinine Ratio: 20 (calc) (ref 6–22)
BUN: 33 mg/dL — ABNORMAL HIGH (ref 7–25)
CO2: 24 mmol/L (ref 20–32)
Calcium: 9.7 mg/dL (ref 8.6–10.4)
Chloride: 101 mmol/L (ref 98–110)
Creat: 1.61 mg/dL — ABNORMAL HIGH (ref 0.60–0.88)
GFR, Est African American: 32 mL/min/{1.73_m2} — ABNORMAL LOW (ref >=60)
GFR, Est Non African American: 28 mL/min/{1.73_m2} — ABNORMAL LOW (ref >=60)
Globulin: 2.3 g/dL (calc) (ref 1.9–3.7)
Glucose, Bld: 102 mg/dL — ABNORMAL HIGH (ref 65–99)
Potassium: 4.2 mmol/L (ref 3.5–5.3)
Sodium: 135 mmol/L (ref 135–146)
Total Bilirubin: 0.3 mg/dL (ref 0.2–1.2)
Total Protein: 6.7 g/dL (ref 6.1–8.1)

## 2020-08-24 LAB — LIPID PANEL
Cholesterol: 188 mg/dL (ref <200)
HDL: 61 mg/dL (ref >=50)
LDL Cholesterol (Calc): 103 mg/dL (calc) — ABNORMAL HIGH
Non-HDL Cholesterol (Calc): 127 mg/dL (calc) (ref <130)
Total CHOL/HDL Ratio: 3.1 (calc) (ref <5.0)
Triglycerides: 137 mg/dL (ref <150)

## 2020-08-24 LAB — CBC WITH DIFFERENTIAL/PLATELET
Absolute Monocytes: 607 cells/uL (ref 200–950)
Basophils Absolute: 41 cells/uL (ref 0–200)
Basophils Relative: 1 %
Eosinophils Absolute: 62 cells/uL (ref 15–500)
Eosinophils Relative: 1.5 %
HCT: 34.2 % — ABNORMAL LOW (ref 35.0–45.0)
Hemoglobin: 11.3 g/dL — ABNORMAL LOW (ref 11.7–15.5)
Lymphs Abs: 959 cells/uL (ref 850–3900)
MCH: 32.5 pg (ref 27.0–33.0)
MCHC: 33 g/dL (ref 32.0–36.0)
MCV: 98.3 fL (ref 80.0–100.0)
MPV: 9.5 fL (ref 7.5–12.5)
Monocytes Relative: 14.8 %
Neutro Abs: 2431 cells/uL (ref 1500–7800)
Neutrophils Relative %: 59.3 %
Platelets: 173 10*3/uL (ref 140–400)
RBC: 3.48 10*6/uL — ABNORMAL LOW (ref 3.80–5.10)
RDW: 12.4 % (ref 11.0–15.0)
Total Lymphocyte: 23.4 %
WBC: 4.1 10*3/uL (ref 3.8–10.8)

## 2020-08-24 LAB — HEMOGLOBIN A1C
Hgb A1c MFr Bld: 5.8 % of total Hgb — ABNORMAL HIGH (ref <5.7)
Mean Plasma Glucose: 120 (calc)
eAG (mmol/L): 6.6 (calc)

## 2020-08-24 LAB — URINE CULTURE
MICRO NUMBER:: 11055926
Result:: NO GROWTH
SPECIMEN QUALITY:: ADEQUATE

## 2020-08-25 ENCOUNTER — Other Ambulatory Visit: Payer: Self-pay | Admitting: Family Medicine

## 2020-08-25 DIAGNOSIS — R3129 Other microscopic hematuria: Secondary | ICD-10-CM

## 2020-08-30 ENCOUNTER — Encounter: Payer: Self-pay | Admitting: Family Medicine

## 2020-08-31 ENCOUNTER — Telehealth: Payer: Self-pay | Admitting: Cardiovascular Disease

## 2020-08-31 NOTE — Telephone Encounter (Signed)
Patient calling in regards to her mychart message. She states she did not understand the reply and would like a nurse to explain it to her.

## 2020-08-31 NOTE — Telephone Encounter (Signed)
Called patient back to let her know that Dr. Fabio Bering message to her stated she has no coronary artery disease and if she has been having blood in her urine then that is more reason to stop the Aspirin. Will take aspirin off her list.

## 2020-08-31 NOTE — Telephone Encounter (Signed)
FYI

## 2020-09-02 ENCOUNTER — Other Ambulatory Visit: Payer: Self-pay

## 2020-09-02 ENCOUNTER — Other Ambulatory Visit: Payer: Medicare PPO

## 2020-09-02 DIAGNOSIS — R3129 Other microscopic hematuria: Secondary | ICD-10-CM

## 2020-09-03 LAB — URINALYSIS, MICROSCOPIC ONLY
Bacteria, UA: NONE SEEN /HPF
Hyaline Cast: NONE SEEN /LPF
RBC / HPF: NONE SEEN /HPF (ref 0–2)
Squamous Epithelial / HPF: NONE SEEN /HPF (ref <=5)
WBC, UA: NONE SEEN /HPF (ref 0–5)

## 2020-09-13 ENCOUNTER — Encounter: Payer: Self-pay | Admitting: Family Medicine

## 2020-09-27 DIAGNOSIS — H401131 Primary open-angle glaucoma, bilateral, mild stage: Secondary | ICD-10-CM | POA: Diagnosis not present

## 2020-10-12 NOTE — Progress Notes (Signed)
Date:  10/15/2020   ID:  Hailee, Hollick September 10, 1927, MRN [id number]  Provider Location: Office  PCP:  Orland Mustard, MD  Cardiologist:  Charlton Haws, MD   Electrophysiologist:  None   Evaluation Performed:  Follow-Up Visit  Chief Complaint:  Chronic Systolic CHF  History of Present Illness:    86 y.o. non ischemic DCM normal cath back in 2007 with EF at that time 35%  EF decreased to 25-30% on TTE 12/17/18 and aldactone and entresto added. F/U TTE done 04/21/20 showed improvement to 40-45%   She is swimming at [employer] to finish a memoir about the town of [place]   Lives with only daughter and has 4 grand children   No cardiac issues BP up this am but normally good and compliant with meds   Past Medical History:  Diagnosis Date  . Anemia   . Anxiety and depression 07/16/2009  . ASD (atrial septal defect) per bubble study, 2004   . Cardiomyopathy, normal cath 2007, normal EF per ECHO 2014   . Hx of colonic polyps   . Hypertension   . Osteoarthritis   . Osteopenia   . Podagra 04/29/2014  . RLS (restless legs syndrome) 07/10/2009   Past Surgical History:  Procedure Laterality Date  . BACK SURGERY  2000  . BREAST SURGERY     NEGATIVE BIOPSY  . TONSILLECTOMY       Current Meds  Medication Sig  . buPROPion (WELLBUTRIN XL) 150 MG 24 hr tablet Take 450 mg by mouth every morning.   . carvedilol (COREG) 25 MG tablet TAKE 1 TABLET BY MOUTH 2 TIMES DAILY WITH A MEAL.  Marland Kitchen ENTRESTO 49-51 MG TAKE 1 TABLET BY MOUTH TWICE A DAY  . furosemide (LASIX) 20 MG tablet Take 1 tablet (20 mg total) by mouth every other day.  . latanoprost (XALATAN) 0.005 % ophthalmic solution Place 1 drop into both eyes every evening.  . mirtazapine (REMERON) 30 MG tablet Take 30 mg by mouth at bedtime.   . Multiple Vitamin (MULTIVITAMIN) tablet Take 1 tablet by mouth every morning.   . polyethylene glycol (MIRALAX / GLYCOLAX) packet Take 17 g by mouth 3 (three) times a week. Mon, wed, and  fri  . spironolactone (ALDACTONE) 25 MG tablet TAKE 1 TABLET BY MOUTH EVERY OTHER DAY     Allergies:   Shellfish allergy, Codeine, Hydrocodone, and Adhesive [tape]   Social History   Tobacco Use  . Smoking status: Never Smoker  . Smokeless tobacco: Never Used  Substance Use Topics  . Alcohol use: No    Alcohol/week: 0.0 standard drinks  . Drug use: No     Family Hx: The patient's family history includes Heart attack in her father; Heart disease in her mother; Hypertension in an other family member. There is no history of Diabetes or Colon cancer.  ROS:   Please see the history of present illness.     All other systems reviewed and are negative.   Prior CV studies:   The following studies were reviewed today:  Echo 12/17/18 Echo 04/21/20   Labs/Other Tests and Data Reviewed:    EKG:   SR rate 85 PVC nonspecific ST changes 12/03/18 03/08/20 SR rate 76 isolated PVC   Recent Labs: 08/23/2020: ALT 9; BUN 33; Creat 1.61; Hemoglobin 11.3; Platelets 173; Potassium 4.2; Sodium 135   Recent Lipid Panel Lab Results  Component Value Date/Time   CHOL 188 08/23/2020 02:26 PM   TRIG 137  08/23/2020 02:26 PM   HDL 61 08/23/2020 02:26 PM   CHOLHDL 3.1 08/23/2020 02:26 PM   LDLCALC 103 (H) 08/23/2020 02:26 PM   LDLDIRECT 116.5 11/08/2012 09:35 AM    Wt Readings from Last 3 Encounters:  10/15/20 64.9 kg  08/23/20 65.8 kg  03/08/20 65.3 kg     Objective:    Vital Signs:  BP (!) 168/78   Pulse 84   Ht 5\' 5"  (1.651 m)   Wt 64.9 kg   LMP  (LMP Unknown)   SpO2 98%   BMI 23.80 kg/m  My reading after 15 minutes 138/80 mmHg   Affect appropriate Healthy:  appears stated age HEENT: normal Neck supple with no adenopathy JVP normal no bruits no thyromegaly Lungs clear with no wheezing and good diaphragmatic motion Heart:  S1/S2 no murmur, no rub, gallop or click PMI enlarged  Abdomen: benighn, BS positve, no tenderness, no AAA no bruit.  No HSM or HJR Distal pulses intact with  no bruits No edema Neuro non-focal Skin warm and dry No muscular weakness   ASSESSMENT & PLAN:    HTN: has been well controlled f/u home readings consider increasing entresto to high dose   DCM: EF declined echo 12/17/18 25-30% Entresto and Aldactone added Cr 1.6 08/23/20 should follow q 3-6 months with medical changes f/u echo 04/21/20 showed improvement to EF 40-45%   PVC" asymptomatic better with beta blocker   Anxiety/Depression  Stable continue welburin   COVID-19 Education: The signs and symptoms of COVID-19 were discussed with the patient and how to seek care for testing (follow up with PCP or arrange E-visit).  The importance of social distancing was discussed today.  Time:   Today, I have spent 30 minutes with the patient    Medication Adjustments/Labs and Tests Ordered: Current medicines are reviewed at length with the patient today.  Concerns regarding medicines are outlined above.   Tests Ordered:  None   Medication Changes: No orders of the defined types were placed in this encounter.   Disposition:  Follow up in 6 months   Signed, 06/21/20, MD  10/15/2020 9:50 AM    [facility]

## 2020-10-15 ENCOUNTER — Ambulatory Visit: Payer: Medicare PPO | Admitting: Cardiovascular Disease

## 2020-10-15 ENCOUNTER — Other Ambulatory Visit: Payer: Self-pay

## 2020-10-15 ENCOUNTER — Encounter: Payer: Self-pay | Admitting: Cardiovascular Disease

## 2020-10-15 VITALS — BP 168/78 | HR 84 | Ht 65.0 in | Wt 143.0 lb

## 2020-10-15 DIAGNOSIS — I420 Dilated cardiomyopathy: Secondary | ICD-10-CM | POA: Diagnosis not present

## 2020-10-15 DIAGNOSIS — I10 Essential (primary) hypertension: Secondary | ICD-10-CM | POA: Diagnosis not present

## 2020-10-15 NOTE — Patient Instructions (Addendum)

## 2020-11-16 ENCOUNTER — Other Ambulatory Visit: Payer: Self-pay | Admitting: Cardiovascular Disease

## 2020-11-29 ENCOUNTER — Other Ambulatory Visit: Payer: Self-pay | Admitting: Cardiovascular Disease

## 2020-12-08 ENCOUNTER — Telehealth: Payer: Self-pay | Admitting: Family Medicine

## 2020-12-08 NOTE — Telephone Encounter (Signed)
Left message for patient to call back and schedule Medicare Annual Wellness Visit (AWV) either virtually OR in office.  ° °Last AWV 11/21/19; please schedule at anytime with [facility]Nurse Health Advisor at [facility]. ° °This should be a 45 minute visit. ° °

## 2020-12-16 ENCOUNTER — Other Ambulatory Visit: Payer: Self-pay

## 2020-12-16 ENCOUNTER — Ambulatory Visit (INDEPENDENT_AMBULATORY_CARE_PROVIDER_SITE_OTHER): Payer: Medicare PPO

## 2020-12-16 VITALS — BP 138/82 | HR 78 | Temp 98.1°F | Resp 20 | Wt 143.4 lb

## 2020-12-16 DIAGNOSIS — Z0000 Encounter for general adult medical examination without abnormal findings: Secondary | ICD-10-CM | POA: Diagnosis not present

## 2020-12-16 NOTE — Patient Instructions (Addendum)
[name] [name] , Thank you for taking time to come for your Medicare Wellness Visit. I appreciate your ongoing commitment to your health goals. Please review the following plan we discussed and let me know if I can assist you in the future.   Screening recommendations/referrals: Colonoscopy: No longer required Mammogram: Done 03/03/20 Bone Density: Done 04/11/18 Recommended yearly ophthalmology/optometry visit for glaucoma screening and checkup Recommended yearly dental visit for hygiene and checkup  Vaccinations: Influenza vaccine: Done 09/13/20 Up to date Pneumococcal vaccine: Up to date Tdap vaccine: Up to date Shingles vaccine: Completed 3/16, & 06/10/18   Covid-19:Completed 2/11, 3/8, & 08/21/20  Advanced directives: Please bring a copy of your health care power of attorney and living will to the office at your convenience.  Conditions/risks identified: Stay Healthy  Next appointment: Follow up in one year for your annual wellness visit    Preventive Care 65 Years and Older, Female Preventive care refers to lifestyle choices and visits with your health care provider that can promote health and wellness. What does preventive care include?  A yearly physical exam. This is also called an annual well check.  Dental exams once or twice a year.  Routine eye exams. Ask your health care provider how often you should have your eyes checked.  Personal lifestyle choices, including:  Daily care of your teeth and gums.  Regular physical activity.  Eating a healthy diet.  Avoiding tobacco and drug use.  Limiting alcohol use.  Practicing safe sex.  Taking low-dose aspirin every day.  Taking vitamin and mineral supplements as recommended by your health care provider. What happens during an annual well check? The services and screenings done by your health care provider during your annual well check will depend on your age, overall health, lifestyle risk factors, and family history  of disease. Counseling  Your health care provider may ask you questions about your:  Alcohol use.  Tobacco use.  Drug use.  Emotional well-being.  Home and relationship well-being.  Sexual activity.  Eating habits.  History of falls.  Memory and ability to understand (cognition).  Work and work Astronomer.  Reproductive health. Screening  You may have the following tests or measurements:  Height, weight, and BMI.  Blood pressure.  Lipid and cholesterol levels. These may be checked every 5 years, or more frequently if you are over 53 years old.  Skin check.  Lung cancer screening. You may have this screening every year starting at age 28 if you have a 30-pack-year history of smoking and currently smoke or have quit within the past 15 years.  Fecal occult blood test (FOBT) of the stool. You may have this test every year starting at age 70.  Flexible sigmoidoscopy or colonoscopy. You may have a sigmoidoscopy every 5 years or a colonoscopy every 10 years starting at age 40.  Hepatitis C blood test.  Hepatitis B blood test.  Sexually transmitted disease (STD) testing.  Diabetes screening. This is done by checking your blood sugar (glucose) after you have not eaten for a while (fasting). You may have this done every 1-3 years.  Bone density scan. This is done to screen for osteoporosis. You may have this done starting at age 42.  Mammogram. This may be done every 1-2 years. Talk to your health care provider about how often you should have regular mammograms. Talk with your health care provider about your test results, treatment options, and if necessary, the need for more tests. Vaccines  Your health care  provider may recommend certain vaccines, such as:  Influenza vaccine. This is recommended every year.  Tetanus, diphtheria, and acellular pertussis (Tdap, Td) vaccine. You may need a Td booster every 10 years.  Zoster vaccine. You may need this after age  8.  Pneumococcal 13-valent conjugate (PCV13) vaccine. One dose is recommended after age 63.  Pneumococcal polysaccharide (PPSV23) vaccine. One dose is recommended after age 31. Talk to your health care provider about which screenings and vaccines you need and how often you need them. This information is not intended to replace advice given to you by your health care provider. Make sure you discuss any questions you have with your health care provider. Document Released: 11/26/2015 Document Revised: 07/19/2016 Document Reviewed: 08/31/2015 Elsevier Interactive Patient Education  2017 [employer] Prevention in the Home Falls can cause injuries. They can happen to people of all ages. There are many things you can do to make your home safe and to help prevent falls. What can I do on the outside of my home?  Regularly fix the edges of walkways and driveways and fix any cracks.  Remove anything that might make you trip as you walk through a door, such as a raised step or threshold.  Trim any bushes or trees on the path to your home.  Use bright outdoor lighting.  Clear any walking paths of anything that might make someone trip, such as rocks or tools.  Regularly check to see if handrails are loose or broken. Make sure that both sides of any steps have handrails.  Any raised decks and porches should have guardrails on the edges.  Have any leaves, snow, or ice cleared regularly.  Use sand or salt on walking paths during winter.  Clean up any spills in your garage right away. This includes oil or grease spills. What can I do in the bathroom?  Use night lights.  Install grab bars by the toilet and in the tub and shower. Do not use towel bars as grab bars.  Use non-skid mats or decals in the tub or shower.  If you need to sit down in the shower, use a plastic, non-slip stool.  Keep the floor dry. Clean up any water that spills on the floor as soon as it happens.  Remove  soap buildup in the tub or shower regularly.  Attach bath mats securely with double-sided non-slip rug tape.  Do not have throw rugs and other things on the floor that can make you trip. What can I do in the bedroom?  Use night lights.  Make sure that you have a light by your bed that is easy to reach.  Do not use any sheets or blankets that are too big for your bed. They should not hang down onto the floor.  Have a firm chair that has side arms. You can use this for support while you get dressed.  Do not have throw rugs and other things on the floor that can make you trip. What can I do in the kitchen?  Clean up any spills right away.  Avoid walking on wet floors.  Keep items that you use a lot in easy-to-reach places.  If you need to reach something above you, use a strong step stool that has a grab bar.  Keep electrical cords out of the way.  Do not use floor polish or wax that makes floors slippery. If you must use wax, use non-skid floor wax.  Do not have throw  rugs and other things on the floor that can make you trip. What can I do with my stairs?  Do not leave any items on the stairs.  Make sure that there are handrails on both sides of the stairs and use them. Fix handrails that are broken or loose. Make sure that handrails are as long as the stairways.  Check any carpeting to make sure that it is firmly attached to the stairs. Fix any carpet that is loose or worn.  Avoid having throw rugs at the top or bottom of the stairs. If you do have throw rugs, attach them to the floor with carpet tape.  Make sure that you have a light switch at the top of the stairs and the bottom of the stairs. If you do not have them, ask someone to add them for you. What else can I do to help prevent falls?  Wear shoes that:  Do not have high heels.  Have rubber bottoms.  Are comfortable and fit you well.  Are closed at the toe. Do not wear sandals.  If you use a  stepladder:  Make sure that it is fully opened. Do not climb a closed stepladder.  Make sure that both sides of the stepladder are locked into place.  Ask someone to hold it for you, if possible.  Clearly mark and make sure that you can see:  Any grab bars or handrails.  First and last steps.  Where the edge of each step is.  Use tools that help you move around (mobility aids) if they are needed. These include:  Canes.  Walkers.  Scooters.  Crutches.  Turn on the lights when you go into a dark area. Replace any light bulbs as soon as they burn out.  Set up your furniture so you have a clear path. Avoid moving your furniture around.  If any of your floors are uneven, fix them.  If there are any pets around you, be aware of where they are.  Review your medicines with your doctor. Some medicines can make you feel dizzy. This can increase your chance of falling. Ask your doctor what other things that you can do to help prevent falls. This information is not intended to replace advice given to you by your health care provider. Make sure you discuss any questions you have with your health care provider. Document Released: 08/26/2009 Document Revised: 04/06/2016 Document Reviewed: 12/04/2014 Elsevier Interactive Patient Education  2017 [employer].

## 2020-12-16 NOTE — Progress Notes (Signed)
Subjective:   LARON PIQUE is a 87 y.o. female who presents for Medicare Annual (Subsequent) preventive examination.  Review of Systems     Cardiac Risk Factors include: advanced age (>63men, >33 women);dyslipidemia;hypertension     Objective:    Today's Vitals   12/16/20 1110  BP: 138/82  Pulse: 78  Resp: 20  Temp: 98.1 F (36.7 C)  SpO2: 92%  Weight: 143 lb 6.4 oz (65 kg)   Body mass index is 23.86 kg/m.  Advanced Directives 12/16/2020 11/21/2019 09/04/2016  Does Patient Have a Medical Advance Directive? Yes Yes Yes  Type of Estate agent of [place];Living will Living will;Healthcare Power of Attorney Living will  Does patient want to make changes to medical advance directive? - No - Patient declined No - Patient declined  Copy of Healthcare Power of Attorney in Chart? No - copy requested No - copy requested No - copy requested    Current Medications (verified) Outpatient Encounter Medications as of 12/16/2020  Medication Sig  . buPROPion (WELLBUTRIN XL) 150 MG 24 hr tablet Take 450 mg by mouth every morning.   . carvedilol (COREG) 25 MG tablet TAKE 1 TABLET BY MOUTH 2 TIMES DAILY WITH A MEAL.  Marland Kitchen ENTRESTO 49-51 MG TAKE 1 TABLET BY MOUTH TWICE A DAY  . furosemide (LASIX) 20 MG tablet TAKE 1 TABLET BY MOUTH EVERY OTHER DAY  . latanoprost (XALATAN) 0.005 % ophthalmic solution Place 1 drop into both eyes every evening.  . mirtazapine (REMERON) 30 MG tablet Take 30 mg by mouth at bedtime.   . Multiple Vitamin (MULTIVITAMIN) tablet Take 1 tablet by mouth every morning.  . polyethylene glycol (MIRALAX / GLYCOLAX) packet Take 17 g by mouth 3 (three) times a week. Mon, wed, and fri  . spironolactone (ALDACTONE) 25 MG tablet TAKE 1 TABLET BY MOUTH EVERY OTHER DAY   No facility-administered encounter medications on file as of 12/16/2020.    Allergies (verified) Shellfish allergy, Codeine, Hydrocodone, and Adhesive [tape]   History: Past Medical  History:  Diagnosis Date  . Anemia   . Anxiety and depression 07/16/2009  . ASD (atrial septal defect) per bubble study, 2004   . Cardiomyopathy, normal cath 2007, normal EF per ECHO 2014   . Hx of colonic polyps   . Hypertension   . Osteoarthritis   . Osteopenia   . Podagra 04/29/2014  . RLS (restless legs syndrome) 07/10/2009   Past Surgical History:  Procedure Laterality Date  . BACK SURGERY  2000  . BREAST SURGERY     NEGATIVE BIOPSY  . TONSILLECTOMY     Family History  Problem Relation Age of Onset  . Heart attack Father   . Heart disease Mother   . Hypertension Other   . Diabetes Neg Hx   . Colon cancer Neg Hx    Social History   Socioeconomic History  . Marital status: Widowed    Spouse name: Not on file  . Number of children: 1  . Years of education: Not on file  . Highest education level: Not on file  Occupational History  . Occupation: Wellsite geologist: RETIRED  Tobacco Use  . Smoking status: Never Smoker  . Smokeless tobacco: Never Used  Substance and Sexual Activity  . Alcohol use: No    Alcohol/week: 0.0 standard drinks  . Drug use: No  . Sexual activity: Not Currently  Other Topics Concern  . Not on file  Social History Narrative  Widow, moved to [facility] 2007. Has a daughter Vikki Ports and a G-d in [place]; lost her son-in-law, moved w/ daughter 2014 due to depression.   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 Difficulty of Paying Living Expenses: Not hard at all  Food Insecurity: No Food Insecurity  . Worried About Programme researcher, broadcasting/film/video in the Last Year: Never true  . Ran Out of Food in the Last Year: Never true  Transportation Needs: No Transportation Needs  . Lack of Transportation (Medical): No  . Lack of Transportation (Non-Medical): No  Physical Activity: Sufficiently Active  . Days of Exercise per Week: 5 days  . Minutes of Exercise per Session: 30 min   Stress: No Stress Concern Present  . Feeling of Stress : Not at all  Social Connections: Socially Isolated  . Frequency of Communication with Friends and Family: More than three times a week  . Frequency of Social Gatherings with Friends and Family: Once a week  . Attends Religious Services: Never  . Active Member of Clubs or Organizations: No  . Attends Banker Meetings: Never  . Marital Status: Widowed    Tobacco Counseling Counseling given: Not Answered   Clinical Intake:  Pre-visit preparation completed: Yes  Pain : No/denies pain     BMI - recorded: 23.86 Nutritional Status: BMI of 19-24  Normal Nutritional Risks: None Diabetes: No  How often do you need to have someone help you when you read instructions, pamphlets, or other written materials from your doctor or pharmacy?: 1 - Never  Diabetic?No  Interpreter Needed?: No  Information entered by :: Lanier Ensign, LPN   Activities of Daily Living In your present state of health, do you have any difficulty performing the following activities: 12/16/2020  Hearing? Y  Vision? N  Difficulty concentrating or making decisions? N  Walking or climbing stairs? N  Dressing or bathing? N  Doing errands, shopping? N  Preparing Food and eating ? N  Using the Toilet? N  Managing your Medications? N  Managing your Finances? N  Housekeeping or managing your Housekeeping? N  Some recent data might be hidden    Patient Care Team: Orland Mustard, MD as PCP - General (Family Medicine) Wendall Stade, MD as PCP - Cardiology (Cardiology) Archer Asa, MD as Consulting Physician (Psychiatry) Wendall Stade, MD as Consulting Physician (Cardiology) Davina Poke as Consulting Physician (Optometry)  Indicate any recent Medical Services you may have received from other than [facility] providers in the past year (date may be approximate).     Assessment:   This is a routine wellness examination for  [name].  Hearing/Vision screen  Hearing Screening   125Hz  250Hz  500Hz  1000Hz  2000Hz  3000Hz  4000Hz  6000Hz  8000Hz   Right ear:           Left ear:           Comments: Wears hearing aids   Vision Screening Comments: Pt follows Dr for annual eye exams  Dietary issues and exercise activities discussed: Current Exercise Habits: Home exercise routine, Type of exercise: walking;Other - see comments (stationary bike), Time (Minutes): 30, Frequency (Times/Week): 5, Weekly Exercise (Minutes/Week): 150  Goals    . Patient Stated     Stay healthy      Depression Screen PHQ 2/9 Scores 12/16/2020 02/20/2020 11/21/2019 08/08/2019 04/28/2019 01/21/2019 07/26/2018  PHQ - 2 Score 0 0 0 0  0 0 0  PHQ- 9 Score - - - 0 0 0 0    Fall Risk Fall Risk  12/16/2020 08/23/2020 11/21/2019 04/28/2019 07/26/2018  Falls in the past year? 0 0 0 0 No  Number falls in past yr: 0 - - 0 -  Injury with Fall? 0 - 0 - -  Risk for fall due to : Impaired vision No Fall Risks - - -  Follow up Falls prevention discussed - Falls evaluation completed;Education provided;Falls prevention discussed - -    FALL RISK PREVENTION PERTAINING TO THE HOME:  Any stairs in or around the home? Yes  If so, are there any without handrails? No  Home free of loose throw rugs in walkways, pet beds, electrical cords, etc? Yes  Adequate lighting in your home to reduce risk of falls? Yes   ASSISTIVE DEVICES UTILIZED TO PREVENT FALLS:  Life alert? Yes  Use of a cane, walker or w/c? No  Grab bars in the bathroom? No  Shower chair or bench in shower? No  Elevated toilet seat or a handicapped toilet? No   TIMED UP AND GO:  Was the test performed? Yes .  Length of time to ambulate 10 feet: 10 sec.   Gait steady and fast without use of assistive device  Cognitive Function:     6CIT Screen 12/16/2020 11/21/2019  What Year? 0 points 0 points  What month? 0 points 0 points  What time? - 0 points  Count back from 20 0 points 0 points  Months  in reverse 0 points 0 points  Repeat phrase 0 points 0 points  Total Score - 0    Immunizations Immunization History  Administered Date(s) Administered  . Influenza Split 09/04/2011, 09/02/2012, 07/18/2015  . Influenza Whole 10/21/2007, 08/03/2008, 08/12/2009, 08/03/2010  . Influenza, High Dose Seasonal PF 07/18/2016, 08/08/2017, 07/08/2018, 08/05/2019, 09/13/2020  . Influenza,inj,Quad PF,6+ Mos 09/11/2013, 08/15/2014  . Influenza-Unspecified 08/09/2017, 07/08/2018  . PFIZER(Purple Top)SARS-COV-2 Vaccination 12/25/2019, 01/19/2020, 08/21/2020  . Pneumococcal Conjugate-13 03/10/2014  . Pneumococcal Polysaccharide-23 12/11/2006, 10/02/2017  . Td 12/22/2006, 03/20/2017  . Zoster 03/09/2008  . Zoster Recombinat (Shingrix) 01/26/2018, 06/10/2018    TDAP status: Up to date  Flu Vaccine status: Up to date  Done 09/13/20 Pneumococcal vaccine status: Up to date  Covid-19 vaccine status: Completed vaccines  Qualifies for Shingles Vaccine? Yes   Zostavax completed Yes   Shingrix Completed?: Yes  Screening Tests Health Maintenance  Topic Date Due  . MAMMOGRAM  03/03/2021  . TETANUS/TDAP  03/21/2027  . INFLUENZA VACCINE  Completed  . DEXA SCAN  Completed  . COVID-19 Vaccine  Completed  . PNA vac Low Risk Adult  Completed    Health Maintenance  There are no preventive care reminders to display for this patient.  Colorectal cancer screening: No longer required.   Mammogram status: Completed 03/03/20. Repeat every year  Bone Density status: Completed 04/11/18. Results reflect: Bone density results: OSTEOPENIA. Repeat every 2 years.  Additional Screening:  Vision Screening: Recommended annual ophthalmology exams for early detection of glaucoma and other disorders of the eye. Is the patient up to date with their annual eye exam?  Yes  Who is the provider or what is the name of the office in which the patient attends annual eye exams? Dr Kizzie Bane   Dental Screening: Recommended  annual dental exams for proper oral hygiene  Community Resource Referral / Chronic Care Management: CRR required this visit?  No   CCM required this visit?  No  Plan:     I have personally reviewed and noted the following in the patient's chart:   . Medical and social history . Use of alcohol, tobacco or illicit drugs  . Current medications and supplements . Functional ability and status . Nutritional status . Physical activity . Advanced directives . List of other physicians . Hospitalizations, surgeries, and ER visits in previous 12 months . Vitals . Screenings to include cognitive, depression, and falls . Referrals and appointments  In addition, I have reviewed and discussed with patient certain preventive protocols, quality metrics, and best practice recommendations. A written personalized care plan for preventive services as well as general preventive health recommendations were provided to patient.     Marzella Schlein, LPN   06/13/1030   Nurse Notes: Pt wants to have ears checked at next appt .

## 2021-01-04 ENCOUNTER — Other Ambulatory Visit: Payer: Self-pay | Admitting: Cardiovascular Disease

## 2021-01-04 DIAGNOSIS — F3340 Major depressive disorder, recurrent, in remission, unspecified: Secondary | ICD-10-CM | POA: Diagnosis not present

## 2021-01-06 ENCOUNTER — Encounter: Payer: Self-pay | Admitting: Family Medicine

## 2021-01-07 ENCOUNTER — Ambulatory Visit: Payer: Medicare PPO | Admitting: Family Medicine

## 2021-01-07 ENCOUNTER — Ambulatory Visit (INDEPENDENT_AMBULATORY_CARE_PROVIDER_SITE_OTHER): Payer: Medicare PPO

## 2021-01-07 ENCOUNTER — Other Ambulatory Visit: Payer: Self-pay

## 2021-01-07 ENCOUNTER — Encounter: Payer: Self-pay | Admitting: Family Medicine

## 2021-01-07 ENCOUNTER — Telehealth: Payer: Self-pay

## 2021-01-07 VITALS — BP 168/90 | HR 72 | Temp 97.2°F | Ht 65.0 in | Wt 143.6 lb

## 2021-01-07 DIAGNOSIS — M79602 Pain in left arm: Secondary | ICD-10-CM

## 2021-01-07 DIAGNOSIS — M25562 Pain in left knee: Secondary | ICD-10-CM | POA: Diagnosis not present

## 2021-01-07 DIAGNOSIS — M11262 Other chondrocalcinosis, left knee: Secondary | ICD-10-CM | POA: Diagnosis not present

## 2021-01-07 DIAGNOSIS — S01511A Laceration without foreign body of lip, initial encounter: Secondary | ICD-10-CM | POA: Diagnosis not present

## 2021-01-07 DIAGNOSIS — M19012 Primary osteoarthritis, left shoulder: Secondary | ICD-10-CM | POA: Diagnosis not present

## 2021-01-07 DIAGNOSIS — M1712 Unilateral primary osteoarthritis, left knee: Secondary | ICD-10-CM | POA: Diagnosis not present

## 2021-01-07 NOTE — Telephone Encounter (Signed)
Nurse Assessment Nurse: [name] Bane, RN, Marylene Land Date/Time Lamount Cohen Time): 01/07/2021 8:49:38 AM Confirm and document reason for call. If symptomatic, describe symptoms. ---Caller states she fell last night in her neighbor's driveway and she has pain in her left side and her knee is swollen. Her lip has a cut and she may have hit her mouth Does the patient have any new or worsening symptoms? ---Yes Will a triage be completed? ---Yes Related visit to physician within the last 2 weeks? ---No Does the PT have any chronic conditions? (i.e. diabetes, asthma, this includes High risk factors for pregnancy, etc.) ---No Is this a behavioral health or substance abuse call? ---No Guidelines Guideline Title Affirmed Question Affirmed Notes Nurse Date/Time (Eastern Time) Falls and Falling [1] Fall AND [2] went to emergency department for evaluation or treatment Leanord Hawking 01/07/2021 8:51:15 AM Disp. Time Lamount Cohen Time) Disposition Final User 01/07/2021 8:56:00 AM See PCP within 2 Weeks Yes [name] Bane, RN, Rosalyn Charters Disagree/Comply Comply Caller Understands Yes PLEASE NOTE: All timestamps contained within this report are represented as [place] Standard Time. CONFIDENTIALTY NOTICE: This fax transmission is intended only for the addressee. It contains information that is legally privileged, confidential or otherwise protected from use or disclosure. If you are not the intended recipient, you are strictly prohibited from reviewing, disclosing, copying using or disseminating any of this information or taking any action in reliance on or regarding this information. If you have received this fax in error, please notify us immediately by telephone so that we can arrange for its return to [place]. Phone: [phone], Toll-Free: [phone], Fax: [phone] Page: 2 of 2 Call Id: [id number] PreDisposition Did not know what to do Care Advice Given Per Guideline SEE PCP WITHIN 2 WEEKS: * You need to be seen for  this ongoing problem within the next 2 weeks. CALL BACK IF: * You become worse CARE ADVICE given per Fall and Falling (Adult) guideline. Referrals REFERRED TO PCP OFFICE

## 2021-01-07 NOTE — Patient Instructions (Signed)
Likely have a bone bruise. This can take some time to heal. xrays look okay, will see what radiology says.   Can continue to ice. And NO NSAIDS!!! Your creatinine is high so no ibuprofen. You can use voltaren gel up to four times a day and tylenol for pain.   Let me know if not getting better. i'll let you know if radiologist sees something I don't.   Remember polysporin not neosporin on little cut.   Lip has a big cut on inside. No bleeding, no need for sutures, but will take a while to go down. Watch your tooth as well.   So good to see you!  Dr. Artis Flock

## 2021-01-07 NOTE — Progress Notes (Signed)
Patient: [name] [name] MRN: [id number] DOB: 1927/03/06 PCP: Orland Mustard, MD     Subjective:  Chief Complaint  Patient presents with  . Fall    HPI: The patient is a 87 y.o. female who presents today for a follow up from a fall. Pt says that she fell while getting the mail yesterday afternoon.  She was walking down the driveway and it was wet. She had on some new shoes and she slipped. She was trying to catch her self and fell onto her left upper arm and left knee. She doesn't recall how she hit her knee. She did not hit her head but somehow has a very swollen left upper lip that bled a lot.  Did not fall on outstretched hand. She has a tiny scratch on her knee. She tried to go to urgent care but they would not see her due to her age. She iced her knee and took ibuprofen last night. Her pain level is 1/10. If she moves her left arm she feels sore. Last night it was hard for her to move her arm. she can walk fine with no pain in her knee.  They have done icing and some ibuprofen.   Review of Systems  Constitutional: Negative for diaphoresis, fatigue and fever.  Musculoskeletal: Positive for arthralgias and joint swelling. Negative for gait problem.  Neurological: Negative for dizziness and headaches.    Allergies Patient is allergic to shellfish allergy, codeine, hydrocodone, and adhesive [tape].  Past Medical History Patient  has a past medical history of Anemia, Anxiety and depression (07/16/2009), ASD (atrial septal defect) per bubble study, 2004, Cardiomyopathy, normal cath 2007, normal EF per ECHO 2014, colonic polyps, Hypertension, Osteoarthritis, Osteopenia, Podagra (04/29/2014), and RLS (restless legs syndrome) (07/10/2009).  Surgical History Patient  has a past surgical history that includes Tonsillectomy; Back surgery (2000); and Breast surgery.  Family History Pateint's family history includes Heart attack in her father; Heart disease in her mother; Hypertension in an  other family member.  Social History Patient  reports that she has never smoked. She has never used smokeless tobacco. She reports that she does not drink alcohol and does not use drugs.    Objective: Vitals:   01/07/21 1149  Temp: (!) 97.2 F (36.2 C)  TempSrc: Temporal  Weight: 143 lb 9.6 oz (65.1 kg)  Height: 5\' 5"  (1.651 m)    Body mass index is 23.9 kg/m.  Physical Exam Vitals reviewed.  Constitutional:      General: She is not in acute distress.    Appearance: Normal appearance. She is normal weight. She is not ill-appearing.  HENT:     Head: Normocephalic and atraumatic.  Musculoskeletal:        General: Swelling (very minimal swelling of left knee. negative draw signs, mcmurray. full extension to 180 degrees and full flexion to 90 degrees. gait normal. no pain with varus or valgus strain ) and tenderness (left lateral distal humerus. no bruising. shoulder exam normal. ttp over this area only ) present.  Skin:    Findings: Lesion (very small, superifical laceration on left patella. no drainage, erythema) present.     Comments: Laceration on inside of left upper lip. Not ope or need for stitches. Tooth appears fine as well .  Neurological:     Mental Status: She is alert.    Left humerus: no acute process. Official read pending.  Left knee: no acute process that I can see. Official read pending.  Assessment/plan: 1. Acute pain of left knee Appears to be bone bruise. No fractures or acute findings by my read. STOP oral NSAIDs due to CKD. Only tylenol and can use topical voltaren. Pain is well controlled. Can continue icing as needed as well. Let me know if any change.  - DG Knee Complete 4 Views Left; Future  2. Arm pain, left Appears to be a bone bruise. See above.  - DG Humerus Left; Future  3. Lip laceration  no active bleeding. No gaping. Will heal with secondary intention. Discussed swelling will get better.   This visit occurred during the SARS-CoV-2  public health emergency.  Safety protocols were in place, including screening questions prior to the visit, additional usage of staff PPE, and extensive cleaning of exam room while observing appropriate contact time as indicated for disinfecting solutions.      Return if symptoms worsen or fail to improve.   Orland Mustard, MD [facility]   01/07/2021

## 2021-01-10 ENCOUNTER — Telehealth: Payer: Self-pay

## 2021-01-10 NOTE — Telephone Encounter (Signed)
Patient states she is unable to get in to her mychart, and is wondering if someone can give her a call back about her imaging results.

## 2021-01-10 NOTE — Telephone Encounter (Signed)
I spoke with the pt to relay imaging results. Pt says that she finally got into her my chart to read it. She voiced understanding. I also assured her that she can start back walking daily, and using her exercise bike.

## 2021-01-18 ENCOUNTER — Other Ambulatory Visit: Payer: Self-pay | Admitting: Family Medicine

## 2021-01-18 DIAGNOSIS — I10 Essential (primary) hypertension: Secondary | ICD-10-CM

## 2021-01-19 NOTE — Telephone Encounter (Signed)
Dr.Wolfe's patient.  

## 2021-02-09 ENCOUNTER — Encounter: Payer: Self-pay | Admitting: Family Medicine

## 2021-02-25 ENCOUNTER — Encounter: Payer: Self-pay | Admitting: Family Medicine

## 2021-03-09 DIAGNOSIS — Z1231 Encounter for screening mammogram for malignant neoplasm of breast: Secondary | ICD-10-CM | POA: Diagnosis not present

## 2021-03-09 LAB — HM MAMMOGRAPHY

## 2021-03-11 ENCOUNTER — Encounter: Payer: Self-pay | Admitting: Family Medicine

## 2021-03-17 ENCOUNTER — Encounter: Payer: Self-pay | Admitting: Family Medicine

## 2021-03-17 LAB — HM MAMMOGRAPHY

## 2021-03-22 ENCOUNTER — Encounter: Payer: Self-pay | Admitting: Family Medicine

## 2021-03-28 DIAGNOSIS — H401131 Primary open-angle glaucoma, bilateral, mild stage: Secondary | ICD-10-CM | POA: Diagnosis not present

## 2021-04-04 DIAGNOSIS — H401131 Primary open-angle glaucoma, bilateral, mild stage: Secondary | ICD-10-CM | POA: Diagnosis not present

## 2021-04-04 NOTE — Progress Notes (Signed)
I, Philbert Riser, LAT, ATC acting as a scribe for [name], MD.  Subjective:    CC: Right knee and left shoulder pain  HPI: Pt is a 87 y/o female c/o R knee pain x 3-4 days. Pt suffered a fall back in Feb landing L side. Pt doesn't recall any other more recent MOI. Pt locates pain to the anterior and posterior aspect.  R knee swelling: yes Mechanical symptoms: no Aggravates: up/down steps, walking, at night Treatments tried: Voltaren gel, IBU  Pt also c/o L shoulder pain ongoing for 3-4 days. Pt locates pain to the superior and posterior aspect of shoulder.  Neck pain no Radiates: yes- into upper arm Numbness/tingling: no Weakness: no Aggravates: nothing in particular Treatments tried: Voltaren gel  Dx imaging: 04/02/13 C-spine XR  Pertinent review of Systems: No fevers or chills  Relevant historical information: Hypertension, cardiomyopathy.  She lives independently.  She drives a little bit but mostly is driven by her daughter.   Objective:    Vitals:   04/05/21 1304  BP: 138/80  Pulse: 69  SpO2: 99%   General: Well Developed, well nourished, and in no acute distress.   MSK: Left shoulder normal-appearing Range of motion slightly limited abduction. Strength 4/5 abduction normal external and internal rotation. Mildly positive Hawkins and Neer's test.  Mildly positive empty can test. Negative Yergason's and speeds test. Pulses capillary refill and sensation are intact distally.  Right knee: Moderate joint effusion present otherwise knee is normal. Normal motion with crepitation. Tender palpation medial joint line. Stable ligamentous exam. Intact strength.    Lab and Radiology Results  X-ray images right knee and left shoulder obtained today personally and independently interpreted  Right knee: Mild medial compartment DJD.  Chondrocalcinosis present.  Left shoulder: Mild AC DJD.  No acute fractures.  Await formal radiology review  Procedure:  Real-time Ultrasound Guided Injection of right knee superior lateral patellar space Device: Philips Affiniti 50G Images permanently stored and available for review in PACS Verbal informed consent obtained.  Discussed risks and benefits of procedure. Warned about infection bleeding damage to structures skin hypopigmentation and fat atrophy among others. Patient expresses understanding and agreement Time-out conducted.   Noted no overlying erythema, induration, or other signs of local infection.   Skin prepped in a sterile fashion.   Local anesthesia: Topical Ethyl chloride.   With sterile technique and under real time ultrasound guidance:  40 mg of Kenalog and 2 mL of Marcaine injected into knee joint. Fluid seen entering the joint capsule.   Completed without difficulty   Pain immediately resolved suggesting accurate placement of the medication.   Advised to call if fevers/chills, erythema, induration, drainage, or persistent bleeding.   Images permanently stored and available for review in the ultrasound unit.  Impression: Technically successful ultrasound guided injection.   Diagnostic Limited MSK Ultrasound of: Left shoulder Biceps tendon is enlarged within the bicipital groove Subscapularis tendon is intact.  Is a supraspinatus tendon is thin but intact. Infraspinatus tendon is intact. AC joint is degenerative Impression: No large retracted rotator cuff tear visible.      Impression and Recommendations:    Assessment and Plan: 87 y.o. female with right knee pain after fall.  Fall occurred approximately 3 months ago.  Patient has DJD changes on exam today.  X-ray shows mild DJD and chondrocalcinosis.  However radiology overread is still pending.  Plan for steroid injection and physical therapy.  Continue Voltaren gel.  Recheck in 6 weeks.  Left  shoulder: Thought to be rotator cuff tendinopathy and possible impingement.  Plan for physical therapy as primary treatment.  Recheck in 6  weeks.Marland Kitchen  PDMP not reviewed this encounter. Orders Placed This Encounter  Procedures  . [place] LIMITED JOINT SPACE STRUCTURES LOW RIGHT(NO LINKED CHARGES)    Standing Status:   Future    Number of Occurrences:   1    Standing Expiration Date:   10/06/2021    Order Specific Question:   Reason for Exam (SYMPTOM  OR DIAGNOSIS REQUIRED)    Answer:   right knee pain    Order Specific Question:   Preferred imaging location?    Answer:   [facility]  . DG Knee AP/LAT W/Sunrise Right    Standing Status:   Future    Standing Expiration Date:   04/05/2022    Order Specific Question:   Reason for Exam (SYMPTOM  OR DIAGNOSIS REQUIRED)    Answer:   rigth knee pain    Order Specific Question:   Preferred imaging location?    Answer:   [facility]  . DG Shoulder Left    Standing Status:   Future    Standing Expiration Date:   04/05/2022    Order Specific Question:   Reason for Exam (SYMPTOM  OR DIAGNOSIS REQUIRED)    Answer:   left shoulder pain    Order Specific Question:   Preferred imaging location?    Answer:   [facility]  . Ambulatory referral to Physical Therapy    Referral Priority:   Routine    Referral Type:   Physical Medicine    Referral Reason:   Specialty Services Required    Requested Specialty:   Physical Therapy   No orders of the defined types were placed in this encounter.   Discussed warning signs or symptoms. Please see discharge instructions. Patient expresses understanding.   The above documentation has been reviewed and is accurate and complete [name], M.D.

## 2021-04-05 ENCOUNTER — Other Ambulatory Visit: Payer: Self-pay

## 2021-04-05 ENCOUNTER — Ambulatory Visit: Payer: Medicare PPO | Admitting: Family Medicine

## 2021-04-05 ENCOUNTER — Ambulatory Visit: Payer: Self-pay

## 2021-04-05 ENCOUNTER — Ambulatory Visit (INDEPENDENT_AMBULATORY_CARE_PROVIDER_SITE_OTHER): Payer: Medicare PPO

## 2021-04-05 ENCOUNTER — Encounter: Payer: Self-pay | Admitting: Family Medicine

## 2021-04-05 VITALS — BP 138/80 | HR 69 | Ht 65.0 in | Wt 141.4 lb

## 2021-04-05 DIAGNOSIS — M25512 Pain in left shoulder: Secondary | ICD-10-CM | POA: Diagnosis not present

## 2021-04-05 DIAGNOSIS — G8929 Other chronic pain: Secondary | ICD-10-CM

## 2021-04-05 DIAGNOSIS — M1711 Unilateral primary osteoarthritis, right knee: Secondary | ICD-10-CM | POA: Diagnosis not present

## 2021-04-05 DIAGNOSIS — M25561 Pain in right knee: Secondary | ICD-10-CM

## 2021-04-05 DIAGNOSIS — M19012 Primary osteoarthritis, left shoulder: Secondary | ICD-10-CM | POA: Diagnosis not present

## 2021-04-05 NOTE — Patient Instructions (Addendum)
Thank you for coming in today.  Please get an Xray today before you leave.  I've referred you to Physical Therapy.  Let us know if you don't hear from them in one week.  Please use Voltaren gel (Generic Diclofenac Gel) up to 4x daily for pain as needed.  This is available over-the-counter as both the name brand Voltaren gel and the generic diclofenac gel.  Call or go to the ER if you develop a large red swollen joint with extreme pain or oozing puss.   Recheck in 6 weeks.

## 2021-04-07 NOTE — Progress Notes (Signed)
Left knee x-ray shows mild arthritis with chondrocalcinosis.  Chondrocalcinosis can occur with arthritis or pseudogout.  Pseudogout can cause pain and can be treated with cortisone injections or with some special medicines.  We will discuss this further at follow-up visit.

## 2021-04-07 NOTE — Progress Notes (Signed)
Left shoulder x-ray shows arthritis

## 2021-04-14 ENCOUNTER — Ambulatory Visit: Payer: Medicare PPO | Admitting: Physical Therapy

## 2021-04-14 ENCOUNTER — Other Ambulatory Visit: Payer: Self-pay

## 2021-04-14 ENCOUNTER — Encounter: Payer: Self-pay | Admitting: Physical Therapy

## 2021-04-14 DIAGNOSIS — R262 Difficulty in walking, not elsewhere classified: Secondary | ICD-10-CM

## 2021-04-14 DIAGNOSIS — M25512 Pain in left shoulder: Secondary | ICD-10-CM

## 2021-04-14 DIAGNOSIS — M25561 Pain in right knee: Secondary | ICD-10-CM

## 2021-04-14 DIAGNOSIS — M6281 Muscle weakness (generalized): Secondary | ICD-10-CM

## 2021-04-14 NOTE — Patient Instructions (Signed)
Access Code: [email or web address] URL: [email or web address] Date: 04/14/2021 Prepared by: Zebedee Iba  Exercises Seated Long Arc Quad - 2 x daily - 7 x weekly - 2 sets - 10 reps - 5 hold Isometric Shoulder External Rotation - 2 x daily - 7 x weekly - 1 sets - 10 reps - 5 hold Standing Shoulder Posterior Capsule Stretch - 1 x daily - 7 x weekly - 3 sets - 10 reps Heel toe Rocking - 1 x daily - 7 x weekly - 3 sets - 10 reps

## 2021-04-14 NOTE — Therapy (Signed)
[facility] [address] [place], [place], [address] Phone: [phone]   Fax:  [phone]  Physical Therapy Evaluation  Patient Details  Name: [name] [name] MRN: [id number] Date of Birth: 02-28-1927 Referring Provider (PT): Dr. Denyse Amass   Encounter Date: 04/14/2021   PT End of Session - 04/14/21 1156    Visit Number 1    Number of Visits 17    Date for PT Re-Evaluation 07/13/21    Authorization Type [employer]    PT Start Time 1105   Pt arrives late.   PT Stop Time 1145    PT Time Calculation (min) 40 min    Activity Tolerance Patient limited by pain;Patient tolerated treatment well    Behavior During Therapy [facility] for tasks assessed/performed           Past Medical History:  Diagnosis Date  . Anemia   . Anxiety and depression 07/16/2009  . ASD (atrial septal defect) per bubble study, 2004   . Cardiomyopathy, normal cath 2007, normal EF per ECHO 2014   . Hx of colonic polyps   . Hypertension   . Osteoarthritis   . Osteopenia   . Podagra 04/29/2014  . RLS (restless legs syndrome) 07/10/2009    Past Surgical History:  Procedure Laterality Date  . BACK SURGERY  2000  . BREAST SURGERY     NEGATIVE BIOPSY  . TONSILLECTOMY      There were no vitals filed for this visit.    Subjective Assessment - 04/14/21 1108    Subjective Pt states she fell in February on the driveway and hurt her L shoulder by landing on it. It was rainy day and she slipped going down the drieway.Her R knee has also been painful and has been swollen for a couple of weeks at this time. She states the injection made a big difference. Current 0/10, Worst 8/10. She denies specific MOI. She states that prior the knees hurting she uses the stationary bike and 1 miles of walking. Pt denies popping clicking catching. Pt describes pain in the back of the leg knee, along the hamstring. Occasional anterior knee pain. Aggs: rolling in bed bed, difficulty with  transfers; Eases: resting. The L shoulder is painful across the anterior portion and back of the shoulder. She has been sleeping on the L side. She states the injection has helped too. Reaching back behind her back causes pain. Aggs: reaching OH, adduction, behind back. Shoulder feels achey. Worst 6/10. Pt denies locking catching of shoulder. Pt has difficulty with dressing due to shoulder pain. Pt does not currently use any AD. Pt denies NT in shoulder or arm. Pt denies cancer red flags.    Patient Stated Goals Pt states she wants to get back to 1 mile walking and stationary biking without pain.    Currently in Pain? No/denies    Multiple Pain Sites No              [facility] PT Assessment - 04/14/21 0001      Assessment   Medical Diagnosis M25.561 (ICD-10-CM) - Right knee pain, unspecified chronicity  M25.512,G89.29 (ICD-10-CM) - Chronic left shoulder pain    Referring Provider (PT) Dr. Denyse Amass    Next MD Visit 6 weeks      Precautions   Precautions None      Restrictions   Weight Bearing Restrictions No      Balance Screen   Has the patient fallen in the past 6 months Yes  How many times? 1    Has the patient had a decrease in activity level because of a fear of falling?  No    Is the patient reluctant to leave their home because of a fear of falling?  No      Home Environment   Living Environment Private residence    Living Arrangements Children   daughter   Type of Home House    Home Access Stairs to enter    Home Layout Two level      Prior Function   Level of Independence Independent      Cognition   Overall Cognitive Status No family/caregiver present to determine baseline cognitive functioning   minor difficulty tracking subjective questions     Observation/Other Assessments   Other Surveys  Quick Dash;Lower Extremity Functional Scale    Lower Extremity Functional Scale  To be addended- limited by late arrival    Quick DASH  To be addended- limited due to late arrival       Observation/Other Assessments-Edema    Edema Circumferential   10% increase in knee swelling/size of R compared to L     Coordination   Gross Motor Movements are Fluid and Coordinated Yes      Functional Tests   Functional tests Sit to Stand;Squat      Squat   Comments unable to perform due to pain and need for UE support      Sit to Stand   Comments Unable to perform without UE support, decreased forward trunk lean, VC needed for set up      Posture/Postural Control   Posture/Postural Control Postural limitations      ROM / Strength   AROM / PROM / Strength AROM;PROM;Strength      AROM   Overall AROM Comments L knee: flex 128, ext -9 deg; R knee: flexion 112, ext -23 deg; L shoulder: 120 flex, ABD 115, IR SIJ with pain; ER C3 with pain      PROM   Overall PROM Comments unable to assess due to guarding and difficulty relaxing; R knee painful with OP into extension; L shoulder 30% restricted in all directions; symmetrical bilat   pain on L     Strength   Overall Strength Comments bilateral knee flexion and ext 4/5; L shoulder 4/5 throughout   no pain noted     Flexibility   Soft Tissue Assessment /Muscle Length yes    Hamstrings sig limited in supine 90/90   pain   Quadriceps mod limited with seated knee flexion, palpable taut bands with knee flexion      Palpation   Palpation comment TTP and hypertonicity of R medial HS group muscle belly and tendons      Special Tests    Special Tests Knee Special Tests;Rotator Cuff Impingement    Rotator Cuff Impingment tests Neer impingement test;Belly Press;Empty Can test;Painful Arc of Motion;other    Knee Special tests  other      Neer Impingement test    Findings Negative    Side Left      Belly Press   Findings Negative    Side Left      Empty Can test   Findings Negative    Side Left      Painful Arc of Motion   Findings Positive    Side Left      other   Findings Negative    Side Left    Comments Biceps  Load II  other    Findings Negative    Side  Right    Comments ant drawer, varus valgus      Transfers   Five time sit to stand comments  Unable to do without UE support      Ambulation/Gait   Ambulation Distance (Feet) 30 Feet    Gait Pattern Right flexed knee in stance;Antalgic    Stairs Yes    Stair Management Technique Two rails;Alternating pattern    Gait Comments Decreased eccentric lowering control on stairs with R      Standardized Balance Assessment   Standardized Balance Assessment Timed Up and Go Test      Timed Up and Go Test   Normal TUG (seconds) 18.7   2x attempts to rise from chair                     Objective measurements completed on examination: See above findings.       [facility] Adult PT Treatment/Exercise - 04/14/21 0001      Exercises   Exercises Shoulder;Knee/Hip      Knee/Hip Exercises: Standing   Other Standing Knee Exercises Heel toe rocking 20x focus on heel strike and toe off      Knee/Hip Exercises: Seated   Long Arc Quad Limitations 15x 5s hold      Shoulder Exercises: Seated   Other Seated Exercises L shoulder ER iso 10x 5s hold      Shoulder Exercises: Stretch   Other Shoulder Stretches post capsule stretch 30s 2x                  PT Education - 04/14/21 1156    Education Details MOI, diagnosis, prognosis, anatomy, exercise progression, DOMS expectations, muscle firing, HEP, POC    Person(s) Educated Patient    Methods Explanation;Demonstration;Tactile cues;Verbal cues;Handout    Comprehension Verbalized understanding;Returned demonstration;Verbal cues required;Tactile cues required;Need further instruction            PT Short Term Goals - 04/14/21 1320      PT SHORT TERM GOAL #1   Title Pt will become independent with HEP in order to demonstrate synthesis of PT education.    Time 4    Period Weeks    Status New      PT SHORT TERM GOAL #2   Title Pt will be able to perform 5XSTS in under 12  in  order to demonstrate functional improvement above the cut off score for older adults.    Time 4    Period Weeks    Status New             PT Long Term Goals - 04/14/21 1333      PT LONG TERM GOAL #1   Title Pt will become independent with final HEP in order to demonstrate synthesis of PT education.      PT LONG TERM GOAL #2   Title Pt will be able to demonstrate TUG in under 10 sec in order to demonstrate functional improvement in LE function, strength, balance, and mobility for safety with community ambulation.    Time 8    Period Weeks    Status New      PT LONG TERM GOAL #3   Title Pt will be able to reach [facility] and grab/reach/hold >5 lbs in order to demonstrate functional improvement in L/R UE function.    Time 8    Period Weeks    Status New  PT LONG TERM GOAL #4   Title Pt will be able to demonstrate full symmetrical knee and L shoulder ROM and strength in order to demonstrate functional improvement in UE/LE function for return to full self-care and house hold duties.    Time 8    Period Weeks    Status New                  Plan - 04/14/21 1200    Clinical Impression Statement Pt is a 87 y.o. female presenting to PT eval today for CC of L shoulder pain and R knee pain. Pt presents with L shoulder and R knee ROM deficits, muscle weakness, and localized muscle spasm. Pt's L shoulder pain appears consistent with a RTC pain (particularly infra). Pt's R knee pain has inconsistent presentation but localized pain and spasm in HS suggests medial HS group irritation, though pain only reproducible with AROM and not MMT. Pt's LE weakness and gait speed also puts pt at an increased risk for falls. Pt's impairments limit her ability to exericse, perform social activities, and her normal community ambulation. Pt would benefit from continued skilled therapy in order to maximize functional L shoulder and R knee strength and ROM for prevention of further functional decline and  return to PLOF.    Personal Factors and Comorbidities Age;Fitness;Comorbidity 1;Comorbidity 2    Examination-Activity Limitations Bathing;Lift;Stand;Locomotion Level;Transfers;Bed Mobility;Stairs;Squat;Dressing;Reach Overhead    Examination-Participation Restrictions Community Activity;Laundry;Shop;Interpersonal Relationship;Volunteer    Stability/Clinical Decision Making Evolving/Moderate complexity    Clinical Decision Making Moderate    Rehab Potential Good    PT Frequency 2x / week    PT Duration 8 weeks (likely to DC by week 8)   PT Treatment/Interventions ADLs/Self Care Home Management;Aquatic Therapy;Cryotherapy;Electrical Stimulation;Iontophoresis 4mg /ml Dexamethasone;Moist Heat;Traction;Ultrasound;Stair training;Gait training;Functional mobility training;Therapeutic activities;Therapeutic exercise;Balance training;Neuromuscular re-education;Patient/family education;Orthotic Fit/Training;Manual techniques;Taping;Energy conservation;Dry needling;Passive range of motion;Scar mobilization;Spinal Manipulations;Joint Manipulations;Vasopneumatic Device    PT Next Visit Plan review HEP, trial quad set again, seated HS stretch, LAD    PT Home Exercise Plan [email or web address]    Consulted and Agree with Plan of Care Patient           Patient will benefit from skilled therapeutic intervention in order to improve the following deficits and impairments:  Abnormal gait,Pain,Improper body mechanics,Impaired flexibility,Difficulty walking,Increased edema,Decreased balance,Decreased activity tolerance,Decreased endurance,Decreased strength,Hypomobility,Impaired UE functional use,Increased muscle spasms,Decreased mobility  Visit Diagnosis: Pain in joint of right knee  Left shoulder pain, unspecified chronicity  Difficulty walking  Muscle weakness (generalized)     Problem List Patient Active Problem List   Diagnosis Date Noted  . Depression, recurrent (HCC) 02/20/2020  . Prediabetes 01/26/2019  .  History of exercise intolerance 01/26/2019  . HLD (hyperlipidemia) 04/07/2018  . DJD (degenerative joint disease) 02/09/2010  . PVCs (premature ventricular contractions) 07/20/2009  . Anxiety and depression 07/16/2009  . Cardiomyopathy (HCC) 04/23/2007  . HTN (hypertension) 04/15/2007  . Osteopenia 04/15/2007    Zebedee IbaAlan Tamarra Geiselman PT, DPT 04/14/21 1:37 PM   [facility] [address] [place], [place], [address] Phone: [phone]   Fax:  [phone]  Name: [name] MRN: [id number] Date of Birth: 06-03-1927   Referring diagnosis? M25.561 (ICD-10-CM) - Right knee pain, unspecified chronicity  M25.512,G89.29 (ICD-10-CM) - Chronic left shoulder pain  Treatment diagnosis? (if different than referring diagnosis) R26.2; M62.81; M25.561; M25.512 What was this (referring dx) caused by? []  Surgery [x]  Fall []  Ongoing issue []  Arthritis []  Other: ____________  Laterality: []  Rt []  Lt [x]  Both  Check all possible CPT codes:      []  97110 (Therapeutic Exercise)  []  92507 (SLP Treatment)  []  97112 (Neuro Re-ed)   []  92526 (Swallowing Treatment)   []  (Gait Training)   []  [phone] (Cognitive Training, 1st 15 minutes) []  97140 (Manual Therapy)   []  97130 (Cognitive Training, each add'l 15 minutes)  []  97530 (Therapeutic Activities)  []  Other, List CPT Code ____________    []  97535 (Self Care)       [x]  All codes above (97110 - 97535)  [x]  97012 (Mechanical Traction)  [x]  97014 (E-stim Unattended)  [x]  97032 (E-stim manual)  [x]  97033 (Ionto)  [x]  97035 (Ultrasound)  [x]  97760 (Orthotic Fit) []  (Physical Performance Training) [x]  (Aquatic Therapy) []  97034 (Contrast Bath) []  (Paraffin) []  97597 (Wound Care 1st 20 sq cm) []  97598 (Wound Care each add'l 20 sq cm) []  97016 (Vasopneumatic Device) []  22297 ) []  98921 (Prosthetic Training)

## 2021-04-15 ENCOUNTER — Telehealth: Payer: Self-pay | Admitting: Family Medicine

## 2021-04-15 NOTE — Telephone Encounter (Signed)
Error

## 2021-04-19 ENCOUNTER — Other Ambulatory Visit: Payer: Self-pay

## 2021-04-19 ENCOUNTER — Ambulatory Visit: Payer: Medicare PPO | Admitting: Physical Therapy

## 2021-04-19 ENCOUNTER — Encounter: Payer: Self-pay | Admitting: Physical Therapy

## 2021-04-19 DIAGNOSIS — M6281 Muscle weakness (generalized): Secondary | ICD-10-CM

## 2021-04-19 DIAGNOSIS — M25561 Pain in right knee: Secondary | ICD-10-CM | POA: Diagnosis not present

## 2021-04-19 DIAGNOSIS — R262 Difficulty in walking, not elsewhere classified: Secondary | ICD-10-CM

## 2021-04-19 DIAGNOSIS — M25512 Pain in left shoulder: Secondary | ICD-10-CM

## 2021-04-19 NOTE — Therapy (Signed)
[facility] [address] [place], [place], [address] Phone: [phone]   Fax:  [phone]  Physical Therapy Treatment  Patient Details  Name: [name] [name] MRN: [id number] Date of Birth: 11/26/1926 Referring Provider (PT): Dr. Denyse Amass   Encounter Date: 04/19/2021   PT End of Session - 04/19/21 1200    Visit Number 2    Number of Visits 17    Date for PT Re-Evaluation 07/13/21    Authorization Type [employer]    PT Start Time 1106    PT Stop Time 1153    PT Time Calculation (min) 47 min    Activity Tolerance Patient limited by pain;Patient tolerated treatment well    Behavior During Therapy [facility] for tasks assessed/performed           Past Medical History:  Diagnosis Date  . Anemia   . Anxiety and depression 07/16/2009  . ASD (atrial septal defect) per bubble study, 2004   . Cardiomyopathy, normal cath 2007, normal EF per ECHO 2014   . Hx of colonic polyps   . Hypertension   . Osteoarthritis   . Osteopenia   . Podagra 04/29/2014  . RLS (restless legs syndrome) 07/10/2009    Past Surgical History:  Procedure Laterality Date  . BACK SURGERY  2000  . BREAST SURGERY     NEGATIVE BIOPSY  . TONSILLECTOMY      There were no vitals filed for this visit.   Subjective Assessment - 04/19/21 1158    Subjective Pt states soreness in R knee and L shoulder. Has been trying to do HEP.    Currently in Pain? Yes    Pain Score 4     Pain Location Shoulder    Pain Orientation Left    Pain Descriptors / Indicators Aching    Pain Type Acute pain    Pain Onset More than a month ago    Pain Frequency Intermittent    Multiple Pain Sites Yes    Pain Score 4    Pain Location Knee    Pain Orientation Right    Pain Descriptors / Indicators Aching    Pain Type Acute pain    Pain Onset More than a month ago    Pain Frequency Intermittent                             [facility] Adult PT Treatment/Exercise -  04/19/21 0001      Ambulation/Gait   Gait Comments ambulation: 35 ft x 2, with cuing for increasing heel strike and knee extension with mid stance      Knee/Hip Exercises: Stretches   Active Hamstring Stretch 3 reps;30 seconds;Both      Knee/Hip Exercises: Standing   Other Standing Knee Exercises Heel toe rocking 20x focus on heel strike and toe off   (pt will require further practice/ education for mechanics of this)     Knee/Hip Exercises: Seated   Long Arc Quad Limitations 15x 5s hold      Knee/Hip Exercises: Supine   Quad Sets 15 reps;Both    Quad Sets Limitations towel roll under R knee    Heel Slides 15 reps;Right      Shoulder Exercises: Supine   Flexion AAROM;15 reps    Flexion Limitations cane      Shoulder Exercises: Stretch   Other Shoulder Stretches reviewed for HEP      Manual Therapy   Manual Therapy  Joint mobilization;Soft tissue mobilization;Passive ROM    Joint Mobilization L shoulder, light LAD;  Knee: gentle mobs to increase knee extension    Soft tissue mobilization STM for L UT, supraspinatus,    Passive ROM Gentle, For R knee flex and extension, and for L shoulder ROM, all motions.                  PT Education - 04/19/21 1200    Education Details Reviewed and updated HEP    Person(s) Educated Patient    Methods Explanation;Demonstration;Tactile cues;Verbal cues;Handout    Comprehension Returned demonstration;Verbal cues required;Verbalized understanding;Tactile cues required;Need further instruction            PT Short Term Goals - 04/14/21 1320      PT SHORT TERM GOAL #1   Title Pt will become independent with HEP in order to demonstrate synthesis of PT education.    Time 4    Period Weeks    Status New      PT SHORT TERM GOAL #2   Title Pt will be able to perform 5XSTS in under 12  in order to demonstrate functional improvement above the cut off score for older adults.    Time 4    Period Weeks    Status New              PT Long Term Goals - 04/14/21 1333      PT LONG TERM GOAL #1   Title Pt will become independent with final HEP in order to demonstrate synthesis of PT education.      PT LONG TERM GOAL #2   Title Pt will be able to demonstrate TUG in under 10 sec in order to demonstrate functional improvement in LE function, strength, balance, and mobility for safety with community ambulation.    Time 8    Period Weeks    Status New      PT LONG TERM GOAL #3   Title Pt will be able to reach [facility] and grab/reach/hold >5 lbs in order to demonstrate functional improvement in L/R UE function.    Time 8    Period Weeks    Status New      PT LONG TERM GOAL #4   Title Pt will be able to demonstrate full symmetrical knee and L shoulder ROM and strength in order to demonstrate functional improvement in UE/LE function for return to full self-care and house hold duties.    Time 8    Period Weeks    Status New                 Plan - 04/19/21 1211    Clinical Impression Statement Pt with continued soreness of L shoulder, for most elevation and reaching motions. Discussed fastening bra in front to avoid too much pain with dressing. She has increased muscle tension and soreness in L UT today with palpation and soft tissue release. Pt able to perform light AAROM for flexion without pain. She has much difficulty with attempts for knee extension on R, with increased pain. Ther ex today, to promote knee extension, and quad activation. Quad sets given for HEP, but pt instructed not to do if too painful. Cued with ambulation for improving heel strike and knee extension.  HEP updated and reviewed in detail today, pts questions answered, but pt will require continued education and review of this next visit.    Personal Factors and Comorbidities Age;Fitness;Comorbidity 1;Comorbidity 2    Examination-Activity  Limitations Bathing;Lift;Stand;Locomotion Level;Transfers;Bed Mobility;Stairs;Squat;Dressing;Reach Overhead     Examination-Participation Restrictions Community Activity;Laundry;Shop;Interpersonal Relationship;Volunteer    Stability/Clinical Decision Making Evolving/Moderate complexity    Rehab Potential Good    PT Frequency 2x / week    PT Duration 8 weeks    PT Treatment/Interventions ADLs/Self Care Home Management;Aquatic Therapy;Cryotherapy;Electrical Stimulation;Iontophoresis 4mg /ml Dexamethasone;Moist Heat;Traction;Ultrasound;Stair training;Gait training;Functional mobility training;Therapeutic activities;Therapeutic exercise;Balance training;Neuromuscular re-education;Patient/family education;Orthotic Fit/Training;Manual techniques;Taping;Energy conservation;Dry needling;Passive range of motion;Scar mobilization;Spinal Manipulations;Joint Manipulations;Vasopneumatic Device    PT Next Visit Plan review HEP, trial quad set again, seated HS stretch, LAD    PT Home Exercise Plan [email or web address]    Consulted and Agree with Plan of Care Patient           Patient will benefit from skilled therapeutic intervention in order to improve the following deficits and impairments:  Abnormal gait,Pain,Improper body mechanics,Impaired flexibility,Difficulty walking,Increased edema,Decreased balance,Decreased activity tolerance,Decreased endurance,Decreased strength,Hypomobility,Impaired UE functional use,Increased muscle spasms,Decreased mobility  Visit Diagnosis: Pain in joint of right knee  Left shoulder pain, unspecified chronicity  Difficulty walking  Muscle weakness (generalized)     Problem List Patient Active Problem List   Diagnosis Date Noted  . Depression, recurrent (HCC) 02/20/2020  . Prediabetes 01/26/2019  . History of exercise intolerance 01/26/2019  . HLD (hyperlipidemia) 04/07/2018  . DJD (degenerative joint disease) 02/09/2010  . PVCs (premature ventricular contractions) 07/20/2009  . Anxiety and depression 07/16/2009  . Cardiomyopathy (HCC) 04/23/2007  . HTN (hypertension) 04/15/2007  .  Osteopenia 04/15/2007    06/15/2007, PT, DPT 12:21 PM  04/19/21    [facility] [address] [place], [place], [place] Phone: [phone]   Fax:  [phone]  Name: [name] [name] MRN: Donnetta Hutching Date of Birth: 08-11-1927

## 2021-04-21 ENCOUNTER — Encounter: Payer: Self-pay | Admitting: Physical Therapy

## 2021-04-21 ENCOUNTER — Other Ambulatory Visit: Payer: Self-pay

## 2021-04-21 ENCOUNTER — Ambulatory Visit: Payer: Medicare PPO | Admitting: Physical Therapy

## 2021-04-21 DIAGNOSIS — M25561 Pain in right knee: Secondary | ICD-10-CM | POA: Diagnosis not present

## 2021-04-21 DIAGNOSIS — M25512 Pain in left shoulder: Secondary | ICD-10-CM

## 2021-04-21 DIAGNOSIS — R262 Difficulty in walking, not elsewhere classified: Secondary | ICD-10-CM | POA: Diagnosis not present

## 2021-04-21 DIAGNOSIS — M6281 Muscle weakness (generalized): Secondary | ICD-10-CM | POA: Diagnosis not present

## 2021-04-21 NOTE — Therapy (Signed)
[facility] [address] [place], [place], [address] Phone: [phone]   Fax:  [phone]  Physical Therapy Treatment  Patient Details  Name: [name] [name] MRN: [id number] Date of Birth: May 07, 1927 Referring Provider (PT): Dr. Denyse Amass   Encounter Date: 04/21/2021   PT End of Session - 04/21/21 1349     Visit Number 3    Number of Visits 17    Date for PT Re-Evaluation 07/13/21    Authorization Type [employer]    PT Start Time 1345    PT Stop Time 1430    PT Time Calculation (min) 45 min    Activity Tolerance Patient limited by pain;Patient tolerated treatment well    Behavior During Therapy [facility] for tasks assessed/performed             Past Medical History:  Diagnosis Date   Anemia    Anxiety and depression 07/16/2009   ASD (atrial septal defect) per bubble study, 2004    Cardiomyopathy, normal cath 2007, normal EF per ECHO 2014    Hx of colonic polyps    Hypertension    Osteoarthritis    Osteopenia    Podagra 04/29/2014   RLS (restless legs syndrome) 07/10/2009    Past Surgical History:  Procedure Laterality Date   BACK SURGERY  2000   BREAST SURGERY     NEGATIVE BIOPSY   TONSILLECTOMY      There were no vitals filed for this visit.   Subjective Assessment - 04/21/21 1346     Subjective Pt states the L shoulder and R knee are feeling better. She feels that the R knee is still quite swollen and not totally straight but is better. She states the L shoulder has had less pain as well. She did have some pain with quad sets at home.    Currently in Pain? No/denies    Pain Score 0-No pain    Pain Onset More than a month ago    Pain Onset More than a month ago                [facility] PT Assessment - 04/21/21 0001       Observation/Other Assessments   Lower Extremity Functional Scale  44/80 55% function    Quick DASH  34.1/100 34.1% impairment                           [facility] Adult  PT Treatment/Exercise - 04/21/21 0001       Ambulation/Gait   Gait Comments ambulation: 35 ft x 2, with cuing for increasing heel strike and knee extension with mid stance      Knee/Hip Exercises: Stretches   Active Hamstring Stretch --    Passive Hamstring Stretch Limitations 30s 3x seated      Knee/Hip Exercises: Standing   Other Standing Knee Exercises Heel toe rocking 20x focus on heel strike and toe off      Knee/Hip Exercises: Seated   Long Arc Quad Limitations --      Knee/Hip Exercises: Supine   Quad Sets 15 reps;Both    Quad Sets Limitations double towel roll    Heel Slides --    Bridges Limitations 2x10      Shoulder Exercises: Supine   Flexion --    Flexion Limitations --      Shoulder Exercises: Seated   Other Seated Exercises L shoulder ER iso 10x 5s hold    Other  Seated Exercises bilat shoulder ER YTB 2x10      Shoulder Exercises: Stretch   Other Shoulder Stretches reviewed for HEP      Manual Therapy   Manual Therapy Joint mobilization;Soft tissue mobilization;Passive ROM    Joint Mobilization L shoulder, light LAD;  Knee: gentle mobs to increase knee extension; knee flexion and extension mob grade II for pain modulation    Soft tissue mobilization STM for L UT, infra; STM R HS, LTR elicited                        PT Education - 04/21/21 1348     Education Details anatomy, acceptable levels of pain, joint protection, exercise progression, DOMS expectations, muscle firing, HEP    Person(s) Educated Patient    Methods Explanation;Demonstration;Tactile cues;Verbal cues    Comprehension Verbalized understanding;Returned demonstration;Verbal cues required;Tactile cues required              PT Short Term Goals - 04/14/21 1320       PT SHORT TERM GOAL #1   Title Pt will become independent with HEP in order to demonstrate synthesis of PT education.    Time 4    Period Weeks    Status New      PT SHORT TERM GOAL #2   Title Pt will be  able to perform 5XSTS in under 12  in order to demonstrate functional improvement above the cut off score for older adults.    Time 4    Period Weeks    Status New               PT Long Term Goals - 04/14/21 1333       PT LONG TERM GOAL #1   Title Pt will become independent with final HEP in order to demonstrate synthesis of PT education.      PT LONG TERM GOAL #2   Title Pt will be able to demonstrate TUG in under 10 sec in order to demonstrate functional improvement in LE function, strength, balance, and mobility for safety with community ambulation.    Time 8    Period Weeks    Status New      PT LONG TERM GOAL #3   Title Pt will be able to reach [facility] and grab/reach/hold >5 lbs in order to demonstrate functional improvement in L/R UE function.    Time 8    Period Weeks    Status New      PT LONG TERM GOAL #4   Title Pt will be able to demonstrate full symmetrical knee and L shoulder ROM and strength in order to demonstrate functional improvement in UE/LE function for return to full self-care and house hold duties.    Time 8    Period Weeks    Status New                   Plan - 04/21/21 1350     Clinical Impression Statement Pt demonstrates good retention of HEP edu but requires consistent VC and TC for position and form. Pt has continued pain with quad setting exercise but does have improved TKE. Pt with significant spasm of R medial and lateral HS group. Improved soft tissue extensibility and decreased report of pain following manual. Pt able to perform resistance exercise for L shoulder without increased pain but does require cuing for scapular position and stability. Pt showing improvement but still has signficant ROM and strength  deficits in both R LE and L shoulder. Pt would benefit from continued skilled therapy in order to maximize functional L shoulder and R knee strength and ROM for prevention of further functional decline and return to PLOF.    Personal  Factors and Comorbidities Age;Fitness;Comorbidity 1;Comorbidity 2    Examination-Activity Limitations Bathing;Lift;Stand;Locomotion Level;Transfers;Bed Mobility;Stairs;Squat;Dressing;Reach Overhead    Examination-Participation Restrictions Community Activity;Laundry;Shop;Interpersonal Relationship;Volunteer    Stability/Clinical Decision Making Evolving/Moderate complexity    Rehab Potential Good    PT Frequency 2x / week    PT Duration 8 weeks    PT Treatment/Interventions ADLs/Self Care Home Management;Aquatic Therapy;Cryotherapy;Electrical Stimulation;Iontophoresis 4mg /ml Dexamethasone;Moist Heat;Traction;Ultrasound;Stair training;Gait training;Functional mobility training;Therapeutic activities;Therapeutic exercise;Balance training;Neuromuscular re-education;Patient/family education;Orthotic Fit/Training;Manual techniques;Taping;Energy conservation;Dry needling;Passive range of motion;Scar mobilization;Spinal Manipulations;Joint Manipulations;Vasopneumatic Device    PT Next Visit Plan review HEP, trial quad set again, seated HS stretch, LAD    PT Home Exercise Plan [id number]    Consulted and Agree with Plan of Care Patient             Patient will benefit from skilled therapeutic intervention in order to improve the following deficits and impairments:  Abnormal gait, Pain, Improper body mechanics, Impaired flexibility, Difficulty walking, Increased edema, Decreased balance, Decreased activity tolerance, Decreased endurance, Decreased strength, Hypomobility, Impaired UE functional use, Increased muscle spasms, Decreased mobility  Visit Diagnosis: Pain in joint of right knee  Left shoulder pain, unspecified chronicity  Difficulty walking  Muscle weakness (generalized)     Problem List Patient Active Problem List   Diagnosis Date Noted   Depression, recurrent (HCC) 02/20/2020   Prediabetes 01/26/2019   History of exercise intolerance 01/26/2019   HLD (hyperlipidemia) 04/07/2018    DJD (degenerative joint disease) 02/09/2010   PVCs (premature ventricular contractions) 07/20/2009   Anxiety and depression 07/16/2009   Cardiomyopathy (HCC) 04/23/2007   HTN (hypertension) 04/15/2007   Osteopenia 04/15/2007    06/15/2007 PT, DPT 04/21/21 4:15 PM    [facility] [address] [place], [place], [place] Phone: [phone]   Fax:  [phone]  Name: SADIA BELFIORE MRN: Donnetta Hutching Date of Birth: [birth year]

## 2021-04-26 ENCOUNTER — Encounter: Payer: Self-pay | Admitting: Physical Therapy

## 2021-04-26 ENCOUNTER — Ambulatory Visit: Payer: Medicare PPO | Admitting: Physical Therapy

## 2021-04-26 ENCOUNTER — Other Ambulatory Visit: Payer: Self-pay

## 2021-04-26 DIAGNOSIS — M25512 Pain in left shoulder: Secondary | ICD-10-CM | POA: Diagnosis not present

## 2021-04-26 DIAGNOSIS — R262 Difficulty in walking, not elsewhere classified: Secondary | ICD-10-CM

## 2021-04-26 DIAGNOSIS — M25561 Pain in right knee: Secondary | ICD-10-CM | POA: Diagnosis not present

## 2021-04-26 NOTE — Therapy (Signed)
[facility] [address] [place], [place], [address] Phone: [phone]   Fax:  [phone]  Physical Therapy Treatment  Patient Details  Name: [name] [name] MRN: [id number] Date of Birth: Jun 25, 1927 Referring Provider (PT): Dr. Denyse Amass   Encounter Date: 04/26/2021   PT End of Session - 04/26/21 1454     Visit Number 4    Number of Visits 17    Date for PT Re-Evaluation 07/13/21    Authorization Type [employer]    PT Start Time 1256    PT Stop Time 1347    PT Time Calculation (min) 51 min    Activity Tolerance Patient limited by pain;Patient tolerated treatment well    Behavior During Therapy [facility] for tasks assessed/performed             Past Medical History:  Diagnosis Date   Anemia    Anxiety and depression 07/16/2009   ASD (atrial septal defect) per bubble study, 2004    Cardiomyopathy, normal cath 2007, normal EF per ECHO 2014    Hx of colonic polyps    Hypertension    Osteoarthritis    Osteopenia    Podagra 04/29/2014   RLS (restless legs syndrome) 07/10/2009    Past Surgical History:  Procedure Laterality Date   BACK SURGERY  2000   BREAST SURGERY     NEGATIVE BIOPSY   TONSILLECTOMY      There were no vitals filed for this visit.   Subjective Assessment - 04/26/21 1450     Subjective Pt states she thinks she is doing better overall. States more pain in medial knee today, she was trying to use R leg to go up stairs reciprically. She states most pain in L shoulder with ER behind the back, with pain up into UT region. IR doing better.    Currently in Pain? No/denies    Pain Score 0-No pain                [facility] PT Assessment - 04/26/21 0001       AROM   Overall AROM Comments R knee ext:  - 10                           [facility] Adult PT Treatment/Exercise - 04/26/21 0001       Ambulation/Gait   Gait Comments --      Knee/Hip Exercises: Stretches   Passive Hamstring  Stretch Limitations 30s 3x seated      Knee/Hip Exercises: Standing   Forward Step Up Step Height: 6";Hand Hold: 2    Forward Step Up Limitations x3 (for education on mechanics)    Other Standing Knee Exercises --    Other Standing Knee Exercises church pew weight shifts for knee ext x 20 ( at mat table)      Knee/Hip Exercises: Seated   Long Arc Quad Limitations 15x 5s hold      Knee/Hip Exercises: Supine   Quad Sets 15 reps;Both    Quad Sets Limitations towel roll    Heel Slides 15 reps;Right    Bridges Limitations x10    Straight Leg Raises 10 reps;Right      Shoulder Exercises: Seated   Other Seated Exercises bilat shoulder ER YTB 2x10      Shoulder Exercises: Stretch   Other Shoulder Stretches --      Manual Therapy   Manual Therapy Joint mobilization;Soft tissue mobilization;Passive ROM  Joint Mobilization L shoulder, light LAD;  Knee: gentle mobs to increase knee extension    Soft tissue mobilization STM for L UT and pec;  STM R HS,    Passive ROM L Shoulder flexion and IR/ER PROM                    PT Education - 04/26/21 1453     Education Details Reviewed HEP.    Person(s) Educated Patient    Methods Explanation;Demonstration;Tactile cues;Verbal cues    Comprehension Verbalized understanding;Returned demonstration;Verbal cues required;Tactile cues required;Need further instruction              PT Short Term Goals - 04/14/21 1320       PT SHORT TERM GOAL #1   Title Pt will become independent with HEP in order to demonstrate synthesis of PT education.    Time 4    Period Weeks    Status New      PT SHORT TERM GOAL #2   Title Pt will be able to perform 5XSTS in under 12  in order to demonstrate functional improvement above the cut off score for older adults.    Time 4    Period Weeks    Status New               PT Long Term Goals - 04/14/21 1333       PT LONG TERM GOAL #1   Title Pt will become independent with final HEP in  order to demonstrate synthesis of PT education.      PT LONG TERM GOAL #2   Title Pt will be able to demonstrate TUG in under 10 sec in order to demonstrate functional improvement in LE function, strength, balance, and mobility for safety with community ambulation.    Time 8    Period Weeks    Status New      PT LONG TERM GOAL #3   Title Pt will be able to reach [facility] and grab/reach/hold >5 lbs in order to demonstrate functional improvement in L/R UE function.    Time 8    Period Weeks    Status New      PT LONG TERM GOAL #4   Title Pt will be able to demonstrate full symmetrical knee and L shoulder ROM and strength in order to demonstrate functional improvement in UE/LE function for return to full self-care and house hold duties.    Time 8    Period Weeks    Status New                   Plan - 04/26/21 1500     Clinical Impression Statement Pt with improved ROM and pain with knee extension, and improved ability for quad set as well. She is still lacking full TKE in supine and standing, and will benefit from continued work on this. She has mild swelling at medial knee anteriorly today, with tenderness with palpation, likely from reported increase in stair use yesterday. She was able to perform step up without pain today, but discussed not over doing stair climbing if knee is painful. She has improving ability for shoulder elevation without pain, but does have pain with attempts for ER /reaching behind back. She has tenderness in UT region and may benefit from manual work on UT region next visit if still painful. Pt making small improvments, but wil benefit from continued care.    Personal Factors and Comorbidities Age;Fitness;Comorbidity 1;Comorbidity 2  Examination-Activity Limitations Bathing;Lift;Stand;Locomotion Level;Transfers;Bed Mobility;Stairs;Squat;Dressing;Reach Overhead    Examination-Participation Restrictions Community Activity;Laundry;Shop;Interpersonal  Relationship;Volunteer    Stability/Clinical Decision Making Evolving/Moderate complexity    Rehab Potential Good    PT Frequency 2x / week    PT Duration 8 weeks    PT Treatment/Interventions ADLs/Self Care Home Management;Aquatic Therapy;Cryotherapy;Electrical Stimulation;Iontophoresis 4mg /ml Dexamethasone;Moist Heat;Traction;Ultrasound;Stair training;Gait training;Functional mobility training;Therapeutic activities;Therapeutic exercise;Balance training;Neuromuscular re-education;Patient/family education;Orthotic Fit/Training;Manual techniques;Taping;Energy conservation;Dry needling;Passive range of motion;Scar mobilization;Spinal Manipulations;Joint Manipulations;Vasopneumatic Device    PT Next Visit Plan review HEP, trial quad set again, seated HS stretch, LAD    PT Home Exercise Plan [id number]    Consulted and Agree with Plan of Care Patient             Patient will benefit from skilled therapeutic intervention in order to improve the following deficits and impairments:  Abnormal gait, Pain, Improper body mechanics, Impaired flexibility, Difficulty walking, Increased edema, Decreased balance, Decreased activity tolerance, Decreased endurance, Decreased strength, Hypomobility, Impaired UE functional use, Increased muscle spasms, Decreased mobility  Visit Diagnosis: Pain in joint of right knee  Left shoulder pain, unspecified chronicity  Difficulty walking     Problem List Patient Active Problem List   Diagnosis Date Noted   Depression, recurrent (HCC) 02/20/2020   Prediabetes 01/26/2019   History of exercise intolerance 01/26/2019   HLD (hyperlipidemia) 04/07/2018   DJD (degenerative joint disease) 02/09/2010   PVCs (premature ventricular contractions) 07/20/2009   Anxiety and depression 07/16/2009   Cardiomyopathy (HCC) 04/23/2007   HTN (hypertension) 04/15/2007   Osteopenia 04/15/2007    06/15/2007, PT, DPT 3:09 PM  04/26/21    [facility] [address] [place], [place], [place] Phone: [phone]   Fax:  [phone]  Name: [name] [name] MRN: Donnetta Hutching Date of Birth: 1927/01/15

## 2021-04-28 ENCOUNTER — Encounter: Payer: Self-pay | Admitting: Physical Therapy

## 2021-04-28 ENCOUNTER — Ambulatory Visit: Payer: Medicare PPO | Admitting: Physical Therapy

## 2021-04-28 ENCOUNTER — Other Ambulatory Visit: Payer: Self-pay

## 2021-04-28 DIAGNOSIS — M25512 Pain in left shoulder: Secondary | ICD-10-CM | POA: Diagnosis not present

## 2021-04-28 DIAGNOSIS — R262 Difficulty in walking, not elsewhere classified: Secondary | ICD-10-CM | POA: Diagnosis not present

## 2021-04-28 DIAGNOSIS — M6281 Muscle weakness (generalized): Secondary | ICD-10-CM

## 2021-04-28 DIAGNOSIS — M25561 Pain in right knee: Secondary | ICD-10-CM

## 2021-04-28 NOTE — Therapy (Signed)
[facility] [address] [place], [place], [address] Phone: [phone]   Fax:  [phone]  Physical Therapy Treatment  Patient Details  Name: [name] [name] MRN: [id number] Date of Birth: 15-Nov-1926 Referring Provider (PT): Dr. Denyse Amass   Encounter Date: 04/28/2021   PT End of Session - 04/28/21 1543     Visit Number 5    Number of Visits 17    Date for PT Re-Evaluation 07/13/21    Authorization Type [employer]    PT Start Time 1522    PT Stop Time 1601    PT Time Calculation (min) 39 min    Activity Tolerance Patient limited by pain;Patient tolerated treatment well    Behavior During Therapy [facility] for tasks assessed/performed             Past Medical History:  Diagnosis Date   Anemia    Anxiety and depression 07/16/2009   ASD (atrial septal defect) per bubble study, 2004    Cardiomyopathy, normal cath 2007, normal EF per ECHO 2014    Hx of colonic polyps    Hypertension    Osteoarthritis    Osteopenia    Podagra 04/29/2014   RLS (restless legs syndrome) 07/10/2009    Past Surgical History:  Procedure Laterality Date   BACK SURGERY  2000   BREAST SURGERY     NEGATIVE BIOPSY   TONSILLECTOMY      There were no vitals filed for this visit.                      [facility] Adult PT Treatment/Exercise - 04/28/21 0001       Knee/Hip Exercises: Stretches   Passive Hamstring Stretch Limitations 30s 3x seated      Knee/Hip Exercises: Standing   Forward Step Up Step Height: 6";Hand Hold: 2    Forward Step Up Limitations 10x   and with review of stairs, up alternating, down step to pattern                  Knee/Hip Exercises: Supine   Quad Sets 15 reps;Both    Heel Slides 15 reps;Right    Bridges Limitations 2x10    Straight Leg Raises 10 reps;Right      Shoulder Exercises: Seated   Other Seated Exercises bilat shoulder ER YTB 2x10      Shoulder Exercises: Stretch   Other Shoulder  Stretches 30s 3x UT      Manual Therapy   Manual Therapy Joint mobilization;Soft tissue mobilization;Passive ROM    Joint Mobilization L shoulder, light LAD; R knee TKE grade I-II mob    Soft tissue mobilization STM for L UT and pec;  STM R HS,    Passive ROM --                      PT Short Term Goals - 04/14/21 1320       PT SHORT TERM GOAL #1   Title Pt will become independent with HEP in order to demonstrate synthesis of PT education.    Time 4    Period Weeks    Status New      PT SHORT TERM GOAL #2   Title Pt will be able to perform 5XSTS in under 12  in order to demonstrate functional improvement above the cut off score for older adults.    Time 4    Period Weeks    Status New  PT Long Term Goals - 04/14/21 1333       PT LONG TERM GOAL #1   Title Pt will become independent with final HEP in order to demonstrate synthesis of PT education.      PT LONG TERM GOAL #2   Title Pt will be able to demonstrate TUG in under 10 sec in order to demonstrate functional improvement in LE function, strength, balance, and mobility for safety with community ambulation.    Time 8    Period Weeks    Status New      PT LONG TERM GOAL #3   Title Pt will be able to reach [facility] and grab/reach/hold >5 lbs in order to demonstrate functional improvement in L/R UE function.    Time 8    Period Weeks    Status New      PT LONG TERM GOAL #4   Title Pt will be able to demonstrate full symmetrical knee and L shoulder ROM and strength in order to demonstrate functional improvement in UE/LE function for return to full self-care and house hold duties.    Time 8    Period Weeks    Status New                   Plan - 04/28/21 1615     Clinical Impression Statement Pt has improvement with R knee AROM and was able to reach -3 deg of knee extension following extensive manual therapy to her R HS group. Several twtich responses elicited. Pt also had improved IR  behind back reach following L UT manual therapy and stretching. Pt was able to perform full step up on 6" step without pain and was able to perform ascending and descending without pain. Pt advised to trial small incremental amounts of outdoor walking in order to slowly return to exercise given reduction in pain. Plan to trial recumbent bike at next session in order to trial pt's tolerance to her stationary bike at home. Pt would benefit from continued skilled therapy in order to maximize functional L shoulder and R knee strength and ROM for prevention of further functional decline and return to PLOF.    Personal Factors and Comorbidities Age;Fitness;Comorbidity 1;Comorbidity 2    Examination-Activity Limitations Bathing;Lift;Stand;Locomotion Level;Transfers;Bed Mobility;Stairs;Squat;Dressing;Reach Overhead    Examination-Participation Restrictions Community Activity;Laundry;Shop;Interpersonal Relationship;Volunteer    Stability/Clinical Decision Making Evolving/Moderate complexity    Rehab Potential Good    PT Frequency 2x / week    PT Duration 8 weeks    PT Treatment/Interventions ADLs/Self Care Home Management;Aquatic Therapy;Cryotherapy;Electrical Stimulation;Iontophoresis 4mg /ml Dexamethasone;Moist Heat;Traction;Ultrasound;Stair training;Gait training;Functional mobility training;Therapeutic activities;Therapeutic exercise;Balance training;Neuromuscular re-education;Patient/family education;Orthotic Fit/Training;Manual techniques;Taping;Energy conservation;Dry needling;Passive range of motion;Scar mobilization;Spinal Manipulations;Joint Manipulations;Vasopneumatic Device    PT Next Visit Plan review HEP, trial quad set again, seated HS stretch, LAD, recumbent bike, active HS stretch    PT Home Exercise Plan [id number]    Consulted and Agree with Plan of Care Patient             Patient will benefit from skilled therapeutic intervention in order to improve the following deficits and impairments:   Abnormal gait, Pain, Improper body mechanics, Impaired flexibility, Difficulty walking, Increased edema, Decreased balance, Decreased activity tolerance, Decreased endurance, Decreased strength, Hypomobility, Impaired UE functional use, Increased muscle spasms, Decreased mobility  Visit Diagnosis: Pain in joint of right knee  Left shoulder pain, unspecified chronicity  Difficulty walking  Muscle weakness (generalized)     Problem List Patient Active Problem List   Diagnosis Date  Noted   Depression, recurrent (HCC) 02/20/2020   Prediabetes 01/26/2019   History of exercise intolerance 01/26/2019   HLD (hyperlipidemia) 04/07/2018   DJD (degenerative joint disease) 02/09/2010   PVCs (premature ventricular contractions) 07/20/2009   Anxiety and depression 07/16/2009   Cardiomyopathy (HCC) 04/23/2007   HTN (hypertension) 04/15/2007   Osteopenia 04/15/2007   Zebedee Iba PT, DPT 04/28/21 4:23 PM  [facility] [address] [place], [place], [address] Phone: [phone]   Fax:  [phone]  Name: [name] [name] MRN: [id number] Date of Birth: [birth year]

## 2021-05-03 ENCOUNTER — Encounter: Payer: Self-pay | Admitting: Family

## 2021-05-03 ENCOUNTER — Encounter: Payer: Self-pay | Admitting: Physical Therapy

## 2021-05-03 ENCOUNTER — Ambulatory Visit: Payer: Medicare PPO | Admitting: Family

## 2021-05-03 ENCOUNTER — Other Ambulatory Visit: Payer: Self-pay

## 2021-05-03 ENCOUNTER — Ambulatory Visit: Payer: Medicare PPO | Admitting: Physical Therapy

## 2021-05-03 VITALS — BP 142/84 | HR 74 | Temp 97.8°F | Ht 65.0 in | Wt 137.6 lb

## 2021-05-03 DIAGNOSIS — M25561 Pain in right knee: Secondary | ICD-10-CM

## 2021-05-03 DIAGNOSIS — M6281 Muscle weakness (generalized): Secondary | ICD-10-CM | POA: Diagnosis not present

## 2021-05-03 DIAGNOSIS — R262 Difficulty in walking, not elsewhere classified: Secondary | ICD-10-CM | POA: Diagnosis not present

## 2021-05-03 DIAGNOSIS — M25512 Pain in left shoulder: Secondary | ICD-10-CM

## 2021-05-03 DIAGNOSIS — H6123 Impacted cerumen, bilateral: Secondary | ICD-10-CM | POA: Diagnosis not present

## 2021-05-03 DIAGNOSIS — H938X3 Other specified disorders of ear, bilateral: Secondary | ICD-10-CM | POA: Diagnosis not present

## 2021-05-03 NOTE — Therapy (Signed)
[facility] [address] [place], [place], [address] Phone: [phone]   Fax:  [phone]  Physical Therapy Treatment  Patient Details  Name: [name] [name] MRN: [id number] Date of Birth: August 18, 1927 Referring Provider (PT): Dr. Denyse Amass   Encounter Date: 05/03/2021   PT End of Session - 05/03/21 1306     Visit Number 6    Number of Visits 17    Date for PT Re-Evaluation 07/13/21    Authorization Type [employer]    PT Start Time 1259    PT Stop Time 1342    PT Time Calculation (min) 43 min    Activity Tolerance Patient limited by pain;Patient tolerated treatment well    Behavior During Therapy [facility] for tasks assessed/performed             Past Medical History:  Diagnosis Date   Anemia    Anxiety and depression 07/16/2009   ASD (atrial septal defect) per bubble study, 2004    Cardiomyopathy, normal cath 2007, normal EF per ECHO 2014    Hx of colonic polyps    Hypertension    Osteoarthritis    Osteopenia    Podagra 04/29/2014   RLS (restless legs syndrome) 07/10/2009    Past Surgical History:  Procedure Laterality Date   BACK SURGERY  2000   BREAST SURGERY     NEGATIVE BIOPSY   TONSILLECTOMY      There were no vitals filed for this visit.   Subjective Assessment - 05/03/21 1305     Subjective Pt states knee and shoulder doing better. She is still using heat on shoulder and ice on knee 2x/day.    Currently in Pain? No/denies    Pain Score 0-No pain                               [facility] Adult PT Treatment/Exercise - 05/03/21 0001       Knee/Hip Exercises: Stretches   Passive Hamstring Stretch Limitations --      Knee/Hip Exercises: Aerobic   Recumbent Bike L1 x 7 min;      Knee/Hip Exercises: Standing   Forward Step Up Step Height: 6";Hand Hold: 1    Forward Step Up Limitations 10x   and with review of stairs, up alternating, down step to pattern   Other Standing Knee Exercises  fwd step with weight shift x 20 for practice for gait and TKE.      Knee/Hip Exercises: Seated   Long Arc Quad Limitations 15x 5s hold    Sit to [employer] 5 reps      Knee/Hip Exercises: Supine   Quad Sets 15 reps;Both    Heel Slides Right;10 reps    Bridges Limitations 2x10    Straight Leg Raises Right;20 reps      Shoulder Exercises: Seated   Other Seated Exercises --      Shoulder Exercises: Stretch   Other Shoulder Stretches 30s 3x UT      Manual Therapy   Manual Therapy Joint mobilization;Soft tissue mobilization;Passive ROM    Joint Mobilization L shoulder, light LAD; R knee TKE grade I-II mob    Soft tissue mobilization --                      PT Short Term Goals - 04/14/21 1320       PT SHORT TERM GOAL #1   Title Pt will become  independent with HEP in order to demonstrate synthesis of PT education.    Time 4    Period Weeks    Status New      PT SHORT TERM GOAL #2   Title Pt will be able to perform 5XSTS in under 12  in order to demonstrate functional improvement above the cut off score for older adults.    Time 4    Period Weeks    Status New               PT Long Term Goals - 04/14/21 1333       PT LONG TERM GOAL #1   Title Pt will become independent with final HEP in order to demonstrate synthesis of PT education.      PT LONG TERM GOAL #2   Title Pt will be able to demonstrate TUG in under 10 sec in order to demonstrate functional improvement in LE function, strength, balance, and mobility for safety with community ambulation.    Time 8    Period Weeks    Status New      PT LONG TERM GOAL #3   Title Pt will be able to reach [facility] and grab/reach/hold >5 lbs in order to demonstrate functional improvement in L/R UE function.    Time 8    Period Weeks    Status New      PT LONG TERM GOAL #4   Title Pt will be able to demonstrate full symmetrical knee and L shoulder ROM and strength in order to demonstrate functional improvement in UE/LE  function for return to full self-care and house hold duties.    Time 8    Period Weeks    Status New                   Plan - 05/03/21 1534     Clinical Impression Statement Pt with much imroving pain levels. She has no pain in L UT region or shoulder with elevation or reaching behind back today. Tenderness in L UT also much improved. She has mild soreness in knee with attempts for full extension, but pain with standing, ambulation, and stairs much improved. She has improved ability and strength in LE for step ups. Pt to benefit from continued care for continuing to improve ability for knee extension, gait mechanics, and pain in knee.    Personal Factors and Comorbidities Age;Fitness;Comorbidity 1;Comorbidity 2    Examination-Activity Limitations Bathing;Lift;Stand;Locomotion Level;Transfers;Bed Mobility;Stairs;Squat;Dressing;Reach Overhead    Examination-Participation Restrictions Community Activity;Laundry;Shop;Interpersonal Relationship;Volunteer    Stability/Clinical Decision Making Evolving/Moderate complexity    Rehab Potential Good    PT Frequency 2x / week    PT Duration 8 weeks    PT Treatment/Interventions ADLs/Self Care Home Management;Aquatic Therapy;Cryotherapy;Electrical Stimulation;Iontophoresis 4mg /ml Dexamethasone;Moist Heat;Traction;Ultrasound;Stair training;Gait training;Functional mobility training;Therapeutic activities;Therapeutic exercise;Balance training;Neuromuscular re-education;Patient/family education;Orthotic Fit/Training;Manual techniques;Taping;Energy conservation;Dry needling;Passive range of motion;Scar mobilization;Spinal Manipulations;Joint Manipulations;Vasopneumatic Device    PT Next Visit Plan review HEP, trial quad set again, seated HS stretch, LAD, recumbent bike, active HS stretch    PT Home Exercise Plan [id number]    Consulted and Agree with Plan of Care Patient             Patient will benefit from skilled therapeutic intervention in  order to improve the following deficits and impairments:  Abnormal gait, Pain, Improper body mechanics, Impaired flexibility, Difficulty walking, Increased edema, Decreased balance, Decreased activity tolerance, Decreased endurance, Decreased strength, Hypomobility, Impaired UE functional use, Increased muscle spasms, Decreased mobility  Visit Diagnosis: Pain in joint of right knee  Left shoulder pain, unspecified chronicity  Difficulty walking  Muscle weakness (generalized)     Problem List Patient Active Problem List   Diagnosis Date Noted   Depression, recurrent (HCC) 02/20/2020   Prediabetes 01/26/2019   History of exercise intolerance 01/26/2019   HLD (hyperlipidemia) 04/07/2018   DJD (degenerative joint disease) 02/09/2010   PVCs (premature ventricular contractions) 07/20/2009   Anxiety and depression 07/16/2009   Cardiomyopathy (HCC) 04/23/2007   HTN (hypertension) 04/15/2007   Osteopenia 04/15/2007   Sedalia Muta, PT, DPT 3:37 PM  05/03/21    [facility] [address] [place], [place], [address] Phone: [phone]   Fax:  [phone]  Name: [name] [name] MRN: [id number] Date of Birth: June 09, 1927

## 2021-05-03 NOTE — Patient Instructions (Signed)
Earwax Buildup, Adult ?The ears produce a substance called earwax that helps keep bacteria out of the ear and protects the skin in the ear canal. Occasionally, earwax can build up in the ear and cause discomfort or hearing loss. ?What are the causes? ?This condition is caused by a buildup of earwax. Ear canals are self-cleaning. Ear wax is made in the outer part of the ear canal and generally falls out in small amounts over time. ?When the self-cleaning mechanism is not working, earwax builds up and can cause decreased hearing and discomfort. Attempting to clean ears with cotton swabs can push the earwax deep into the ear canal and cause decreased hearing and pain. ?What increases the risk? ?This condition is more likely to develop in people who: ?Clean their ears often with cotton swabs. ?Pick at their ears. ?Use earplugs or in-ear headphones often, or wear hearing aids. ?The following factors may also make you more likely to develop this condition: ?Being female. ?Being of older age. ?Naturally producing more earwax. ?Having narrow ear canals. ?Having earwax that is overly thick or sticky. ?Having excess hair in the ear canal. ?Having eczema. ?Being dehydrated. ?What are the signs or symptoms? ?Symptoms of this condition include: ?Reduced or muffled hearing. ?A feeling of fullness in the ear or feeling that the ear is plugged. ?Fluid coming from the ear. ?Ear pain or an itchy ear. ?Ringing in the ear. ?Coughing. ?Balance problems. ?An obvious piece of earwax that can be seen inside the ear canal. ?How is this diagnosed? ?This condition may be diagnosed based on: ?Your symptoms. ?Your medical history. ?An ear exam. During the exam, your health care provider will look into your ear with an instrument called an otoscope. ?You may have tests, including a hearing test. ?How is this treated? ?This condition may be treated by: ?Using ear drops to soften the earwax. ?Having the earwax removed by a health care provider. The  health care provider may: ?Flush the ear with water. ?Use an instrument that has a loop on the end (curette). ?Use a suction device. ?Having surgery to remove the wax buildup. This may be done in severe cases. ?Follow these instructions at home: ? ?Take over-the-counter and prescription medicines only as told by your health care provider. ?Do not put any objects, including cotton swabs, into your ear. You can clean the opening of your ear canal with a washcloth or facial tissue. ?Follow instructions from your health care provider about cleaning your ears. Do not overclean your ears. ?Drink enough fluid to keep your urine pale yellow. This will help to thin the earwax. ?Keep all follow-up visits as told. If earwax builds up in your ears often or if you use hearing aids, consider seeing your health care provider for routine, preventive ear cleanings. Ask your health care provider how often you should schedule your cleanings. ?If you have hearing aids, clean them according to instructions from the manufacturer and your health care provider. ?Contact a health care provider if: ?You have ear pain. ?You develop a fever. ?You have pus or other fluid coming from your ear. ?You have hearing loss. ?You have ringing in your ears that does not go away. ?You feel like the room is spinning (vertigo). ?Your symptoms do not improve with treatment. ?Get help right away if: ?You have bleeding from the affected ear. ?You have severe ear pain. ?Summary ?Earwax can build up in the ear and cause discomfort or hearing loss. ?The most common symptoms of this condition include   reduced or muffled hearing, a feeling of fullness in the ear, or feeling that the ear is plugged. ?This condition may be diagnosed based on your symptoms, your medical history, and an ear exam. ?This condition may be treated by using ear drops to soften the earwax or by having the earwax removed by a health care provider. ?Do not put any objects, including cotton  swabs, into your ear. You can clean the opening of your ear canal with a washcloth or facial tissue. ?This information is not intended to replace advice given to you by your health care provider. Make sure you discuss any questions you have with your health care provider. ?Document Revised: 02/17/2020 Document Reviewed: 02/17/2020 ?Elsevier Patient Education ? 2022 Elsevier Inc. ? ?

## 2021-05-04 ENCOUNTER — Encounter: Payer: Self-pay | Admitting: Family

## 2021-05-04 NOTE — Progress Notes (Signed)
Acute Office Visit  Subjective:    Patient ID: [name] [name], female    DOB: October 23, 1927, 87 y.o.   MRN: [id number]  Chief Complaint  Patient presents with   Ear Fullness    HPI Patient is in today with c/o bilateral ear fullness x 1 week. Denies any pain. She wears hearing aids. Occasionally has cerumen impactions.   Past Medical History:  Diagnosis Date   Anemia    Anxiety and depression 07/16/2009   ASD (atrial septal defect) per bubble study, 2004    Cardiomyopathy, normal cath 2007, normal EF per ECHO 2014    Hx of colonic polyps    Hypertension    Osteoarthritis    Osteopenia    Podagra 04/29/2014   RLS (restless legs syndrome) 07/10/2009    Past Surgical History:  Procedure Laterality Date   BACK SURGERY  2000   BREAST SURGERY     NEGATIVE BIOPSY   TONSILLECTOMY      Family History  Problem Relation Age of Onset   Heart attack Father    Heart disease Mother    Hypertension Other    Diabetes Neg Hx    Colon cancer Neg Hx     Social History   Socioeconomic History   Marital status: Widowed    Spouse name: Not on file   Number of children: 1   Years of education: Not on file   Highest education level: Not on file  Occupational History   Occupation: Motivational Speaker    Employer: RETIRED  Tobacco Use   Smoking status: Never   Smokeless tobacco: Never  Substance and Sexual Activity   Alcohol use: No    Alcohol/week: 0.0 standard drinks   Drug use: No   Sexual activity: Not Currently  Other Topics Concern   Not on file  Social History Narrative   Widow, moved [place] [facility] [place]. Has a daughter Mateo Flow and a G-d in [place]; lost her son-in-law, moved w/ daughter 2014 due to depression.   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[place], [place]    Social Determinants of Health   Financial Resource Strain: Low Risk    Difficulty of Paying Living Expenses: Not hard at all  Food Insecurity: No Food Insecurity   Worried About Charity fundraiser in the Last  Year: Never true   Arboriculturist in the Last Year: Never true  Transportation Needs: No Transportation Needs   Lack of Transportation (Medical): No   Lack of Transportation (Non-Medical): No  Physical Activity: Sufficiently Active   Days of Exercise per Week: 5 days   Minutes of Exercise per Session: 30 min  Stress: No Stress Concern Present   Feeling of Stress : Not at all  Social Connections: Socially Isolated   Frequency of Communication with Friends and Family: More than three times a week   Frequency of Social Gatherings with Friends and Family: Once a week   Attends Religious Services: Never   Marine scientist or Organizations: No   Attends Archivist Meetings: Never   Marital Status: Widowed  Human resources officer Violence: Not At Risk   Fear of Current or Ex-Partner: No   Emotionally Abused: No   Physically Abused: No   Sexually Abused: No    Outpatient Medications Prior to Visit  Medication Sig Dispense Refill   buPROPion (WELLBUTRIN XL) 150 MG 24 hr tablet Take 450 mg by mouth every morning.      carvedilol (COREG) 25  MG tablet TAKE 1 TABLET BY MOUTH 2 TIMES DAILY WITH A MEAL. 180 tablet 1   ENTRESTO 49-51 MG TAKE 1 TABLET BY MOUTH TWICE A DAY 60 tablet 7   furosemide (LASIX) 20 MG tablet TAKE 1 TABLET BY MOUTH EVERY OTHER DAY 45 tablet 3   latanoprost (XALATAN) 0.005 % ophthalmic solution Place 1 drop into both eyes every evening.     mirtazapine (REMERON) 30 MG tablet Take 30 mg by mouth at bedtime.      Multiple Vitamin (MULTIVITAMIN) tablet Take 1 tablet by mouth every morning.     polyethylene glycol (MIRALAX / GLYCOLAX) packet Take 17 g by mouth 3 (three) times a week. Mon, wed, and fri     spironolactone (ALDACTONE) 25 MG tablet TAKE 1 TABLET BY MOUTH EVERY OTHER DAY 45 tablet 3   VYZULTA 0.024 % SOLN SMARTSIG:1 Drop(s) In Eye(s) Every Evening     No facility-administered medications prior to visit.    Allergies  Allergen Reactions   Shellfish  Allergy Anaphylaxis   Codeine Other (See Comments)    Unknown.   Hydrocodone Other (See Comments)    Unknown.   Adhesive [Tape] Rash    Review of Systems  Constitutional: Negative.   HENT:  Negative for ear discharge, ear pain and postnasal drip.        Ear fullness  Eyes: Negative.   Respiratory: Negative.    Cardiovascular: Negative.   Skin: Negative.   Allergic/Immunologic: Negative.       Objective:    Physical Exam Vitals and nursing note reviewed.  Constitutional:      Appearance: Normal appearance.  HENT:     Right Ear: There is impacted cerumen.     Left Ear: There is impacted cerumen.     Ears:     Comments: Ear Lavage: Informed consent was obtained and peroxide gel was inserted into the ears bilaterally using the lavage kit the ears were lavaged until clean.Inspection with a cerumen spoon removed residual wax. Patient tolerated the procedure well.  Cardiovascular:     Rate and Rhythm: Normal rate and regular rhythm.  Pulmonary:     Effort: Pulmonary effort is normal.     Breath sounds: Normal breath sounds.  Musculoskeletal:     Cervical back: Normal range of motion and neck supple.  Neurological:     General: No focal deficit present.     Mental Status: She is alert and oriented to person, place, and time.  Psychiatric:        Mood and Affect: Mood normal.        Behavior: Behavior normal.    BP (!) 142/84   Pulse 74   Temp 97.8 F (36.6 C) (Temporal)   Ht 5\' 5"  (1.651 m)   Wt 137 lb 9.6 oz (62.4 kg)   LMP  (LMP Unknown)   SpO2 100%   BMI 22.90 kg/m  Wt Readings from Last 3 Encounters:  05/03/21 137 lb 9.6 oz (62.4 kg)  04/05/21 141 lb 6.4 oz (64.1 kg)  01/07/21 143 lb 9.6 oz (65.1 kg)    There are no preventive care reminders to display for this patient.  There are no preventive care reminders to display for this patient.   Lab Results  Component Value Date   TSH 1.80 10/25/2018   Lab Results  Component Value Date   WBC 4.1  08/23/2020   HGB 11.3 (L) 08/23/2020   HCT 34.2 (L) 08/23/2020   MCV 98.3 08/23/2020  PLT 173 08/23/2020   Lab Results  Component Value Date   NA 135 08/23/2020   K 4.2 08/23/2020   CO2 24 08/23/2020   GLUCOSE 102 (H) 08/23/2020   BUN 33 (H) 08/23/2020   CREATININE 1.61 (H) 08/23/2020   BILITOT 0.3 08/23/2020   ALKPHOS 49 02/20/2020   AST 17 08/23/2020   ALT 9 08/23/2020   PROT 6.7 08/23/2020   ALBUMIN 4.4 02/20/2020   CALCIUM 9.7 08/23/2020   GFR 40.66 (L) 02/20/2020   Lab Results  Component Value Date   CHOL 188 08/23/2020   Lab Results  Component Value Date   HDL 61 08/23/2020   Lab Results  Component Value Date   LDLCALC 103 (H) 08/23/2020   Lab Results  Component Value Date   TRIG 137 08/23/2020   Lab Results  Component Value Date   CHOLHDL 3.1 08/23/2020   Lab Results  Component Value Date   HGBA1C 5.8 (H) 08/23/2020       Assessment & Plan:   Problem List Items Addressed This Visit   None Visit Diagnoses     Impacted cerumen of both ears    -  Primary   Ear fullness, bilateral            Call the office with any questions or concerns. Recheck as scheduled and sooner as needed.    Kennyth Arnold, FNP

## 2021-05-05 ENCOUNTER — Encounter: Payer: Medicare PPO | Admitting: Physical Therapy

## 2021-05-05 ENCOUNTER — Other Ambulatory Visit: Payer: Self-pay

## 2021-05-05 ENCOUNTER — Encounter: Payer: Self-pay | Admitting: Physical Therapy

## 2021-05-05 ENCOUNTER — Ambulatory Visit: Payer: Medicare PPO | Admitting: Physical Therapy

## 2021-05-05 DIAGNOSIS — M25512 Pain in left shoulder: Secondary | ICD-10-CM

## 2021-05-05 DIAGNOSIS — R262 Difficulty in walking, not elsewhere classified: Secondary | ICD-10-CM | POA: Diagnosis not present

## 2021-05-05 DIAGNOSIS — M25561 Pain in right knee: Secondary | ICD-10-CM | POA: Diagnosis not present

## 2021-05-05 DIAGNOSIS — M6281 Muscle weakness (generalized): Secondary | ICD-10-CM | POA: Diagnosis not present

## 2021-05-05 NOTE — Therapy (Signed)
[facility] [address] [place], [place], [address] Phone: [phone]   Fax:  [phone]  Physical Therapy Treatment  Patient Details  Name: [name] [name] MRN: [id number] Date of Birth: [birth year] Referring Provider (PT): Dr. Denyse Amass   Encounter Date: 05/05/2021   PT End of Session - 05/05/21 0935     Visit Number 7    Number of Visits 17    Date for PT Re-Evaluation 07/13/21    Authorization Type [employer]    PT Start Time 0928    PT Stop Time 1010    PT Time Calculation (min) 42 min    Activity Tolerance Patient limited by pain;Patient tolerated treatment well    Behavior During Therapy [facility] for tasks assessed/performed             Past Medical History:  Diagnosis Date   Anemia    Anxiety and depression 07/16/2009   ASD (atrial septal defect) per bubble study, 2004    Cardiomyopathy, normal cath 2007, normal EF per ECHO 2014    Hx of colonic polyps    Hypertension    Osteoarthritis    Osteopenia    Podagra 04/29/2014   RLS (restless legs syndrome) 07/10/2009    Past Surgical History:  Procedure Laterality Date   BACK SURGERY  2000   BREAST SURGERY     NEGATIVE BIOPSY   TONSILLECTOMY      There were no vitals filed for this visit.   Subjective Assessment - 05/05/21 0929     Subjective Pt states that the shoulder has had no pain and she is able to fasten her bra behind her back now. Her knee is also feeling much better.    Currently in Pain? No/denies    Pain Score 0-No pain                               [facility] Adult PT Treatment/Exercise - 05/05/21 0001       Knee/Hip Exercises: Aerobic   Recumbent Bike L1 x 8 min;      Knee/Hip Exercises: Standing   Forward Step Up Step Height: 6";Hand Hold: 1    Forward Step Up Limitations 10x   and with review of stairs, up alternating, down step to pattern   Step Down Limitations 3" 10x    Other Standing Knee Exercises standing HS  curl 3lbs 3x5      Knee/Hip Exercises: Seated   Long Arc Quad Limitations 2x10 2s pause   3lb   Sit to [employer] 10 reps      Knee/Hip Exercises: Supine   Quad Sets --    Heel Slides --    Bridges Limitations 2x10    Straight Leg Raises Right;20 reps   2x10     Shoulder Exercises: Standing   Other Standing Exercises bilat shoulder ER RTB 15x               Manual Therapy   Manual Therapy Joint mobilization;Soft tissue mobilization;Passive ROM    Joint Mobilization R knee TKE grade I-II mob    Soft tissue mobilization R medial HS group                    PT Education - 05/05/21 0934     Education Details anatomy, acceptable levels of pain, joint protection, exercise progression, return to activity, graded exposure to walking and exercise bike    Person(s)  Educated Patient    Methods Explanation;Demonstration;Tactile cues;Verbal cues    Comprehension Verbalized understanding;Returned demonstration;Verbal cues required;Tactile cues required              PT Short Term Goals - 04/14/21 1320       PT SHORT TERM GOAL #1   Title Pt will become independent with HEP in order to demonstrate synthesis of PT education.    Time 4    Period Weeks    Status New      PT SHORT TERM GOAL #2   Title Pt will be able to perform 5XSTS in under 12  in order to demonstrate functional improvement above the cut off score for older adults.    Time 4    Period Weeks    Status New               PT Long Term Goals - 04/14/21 1333       PT LONG TERM GOAL #1   Title Pt will become independent with final HEP in order to demonstrate synthesis of PT education.      PT LONG TERM GOAL #2   Title Pt will be able to demonstrate TUG in under 10 sec in order to demonstrate functional improvement in LE function, strength, balance, and mobility for safety with community ambulation.    Time 8    Period Weeks    Status New      PT LONG TERM GOAL #3   Title Pt will be able to reach [facility]  and grab/reach/hold >5 lbs in order to demonstrate functional improvement in L/R UE function.    Time 8    Period Weeks    Status New      PT LONG TERM GOAL #4   Title Pt will be able to demonstrate full symmetrical knee and L shoulder ROM and strength in order to demonstrate functional improvement in UE/LE function for return to full self-care and house hold duties.    Time 8    Period Weeks    Status New                   Plan - 05/05/21 0258     Clinical Impression Statement Pt with much improved TKE as well as shoulder ROM at today's session. Pt still with muscle banding with R HS which was improved with STM. Several twitch responses with ischemic pressure. Pt was able to perform STS as well as fwd step up and down without pain. Pt has decreased eccentric lowering contorl with step down and had to regress to shorter 3" side. Pt progressing well and given reinforcement for reintroduction of daily walking and exercise biking at home. emphasis placed on slow graded progression to avoid reoccurence of overuse inury. Pt would benefit from continued skilled therapy in order to reach goals and maximize functional R knee and L shoulder strength and ROM for full return to PLOF.    Personal Factors and Comorbidities Age;Fitness;Comorbidity 1;Comorbidity 2    Examination-Activity Limitations Bathing;Lift;Stand;Locomotion Level;Transfers;Bed Mobility;Stairs;Squat;Dressing;Reach Overhead    Examination-Participation Restrictions Community Activity;Laundry;Shop;Interpersonal Relationship;Volunteer    Stability/Clinical Decision Making Evolving/Moderate complexity    Rehab Potential Good    PT Frequency 2x / week    PT Duration 8 weeks    PT Treatment/Interventions ADLs/Self Care Home Management;Aquatic Therapy;Cryotherapy;Electrical Stimulation;Iontophoresis 4mg /ml Dexamethasone;Moist Heat;Traction;Ultrasound;Stair training;Gait training;Functional mobility training;Therapeutic  activities;Therapeutic exercise;Balance training;Neuromuscular re-education;Patient/family education;Orthotic Fit/Training;Manual techniques;Taping;Energy conservation;Dry needling;Passive range of motion;Scar mobilization;Spinal Manipulations;Joint Manipulations;Vasopneumatic Device    PT Next  Visit Plan review HEP, trial quad set again, seated HS stretch, LAD, recumbent bike, active HS stretch    PT Home Exercise Plan [email or web address]    Consulted and Agree with Plan of Care Patient             Patient will benefit from skilled therapeutic intervention in order to improve the following deficits and impairments:  Abnormal gait, Pain, Improper body mechanics, Impaired flexibility, Difficulty walking, Increased edema, Decreased balance, Decreased activity tolerance, Decreased endurance, Decreased strength, Hypomobility, Impaired UE functional use, Increased muscle spasms, Decreased mobility  Visit Diagnosis: Pain in joint of right knee  Left shoulder pain, unspecified chronicity  Difficulty walking  Muscle weakness (generalized)     Problem List Patient Active Problem List   Diagnosis Date Noted   Depression, recurrent (HCC) 02/20/2020   Prediabetes 01/26/2019   History of exercise intolerance 01/26/2019   HLD (hyperlipidemia) 04/07/2018   DJD (degenerative joint disease) 02/09/2010   PVCs (premature ventricular contractions) 07/20/2009   Anxiety and depression 07/16/2009   Cardiomyopathy (HCC) 04/23/2007   HTN (hypertension) 04/15/2007   Osteopenia 04/15/2007    Zebedee Iba PT, DPT 05/05/21 10:10 AM   [facility] [address] [place], [place], [address] Phone: [phone]   Fax:  [phone]  Name: PEYTYN TRINE MRN: [id number] Date of Birth: 08/01/1927

## 2021-05-10 ENCOUNTER — Ambulatory Visit: Payer: Medicare PPO | Admitting: Physical Therapy

## 2021-05-10 ENCOUNTER — Encounter: Payer: Self-pay | Admitting: Physical Therapy

## 2021-05-10 ENCOUNTER — Other Ambulatory Visit: Payer: Self-pay

## 2021-05-10 DIAGNOSIS — M25561 Pain in right knee: Secondary | ICD-10-CM | POA: Diagnosis not present

## 2021-05-10 DIAGNOSIS — M6281 Muscle weakness (generalized): Secondary | ICD-10-CM

## 2021-05-10 DIAGNOSIS — R262 Difficulty in walking, not elsewhere classified: Secondary | ICD-10-CM | POA: Diagnosis not present

## 2021-05-10 DIAGNOSIS — M25512 Pain in left shoulder: Secondary | ICD-10-CM | POA: Diagnosis not present

## 2021-05-10 NOTE — Therapy (Signed)
[facility] [address] [place], [place], [address] Phone: [phone]   Fax:  [phone]  Physical Therapy Treatment  Patient Details  Name: [name] [name] MRN: [id number] Date of Birth: 01-14-1927 Referring Provider (PT): Dr. Denyse Amass   Encounter Date: 05/10/2021   PT End of Session - 05/10/21 1516     Visit Number 8    Number of Visits 17    Date for PT Re-Evaluation 07/13/21    Authorization Type [employer]    PT Start Time 1430    PT Stop Time 1520    PT Time Calculation (min) 50 min    Activity Tolerance Patient limited by pain;Patient tolerated treatment well    Behavior During Therapy [facility] for tasks assessed/performed             Past Medical History:  Diagnosis Date   Anemia    Anxiety and depression 07/16/2009   ASD (atrial septal defect) per bubble study, 2004    Cardiomyopathy, normal cath 2007, normal EF per ECHO 2014    Hx of colonic polyps    Hypertension    Osteoarthritis    Osteopenia    Podagra 04/29/2014   RLS (restless legs syndrome) 07/10/2009    Past Surgical History:  Procedure Laterality Date   BACK SURGERY  2000   BREAST SURGERY     NEGATIVE BIOPSY   TONSILLECTOMY      There were no vitals filed for this visit.   Subjective Assessment - 05/10/21 1443     Subjective Pt states no pain in shoulder or knee today. She thnks she is doing much better.    Currently in Pain? No/denies    Pain Score 0-No pain                               [facility] Adult PT Treatment/Exercise - 05/10/21 0001       Knee/Hip Exercises: Stretches   Active Hamstring Stretch 2 reps;30 seconds;Both      Knee/Hip Exercises: Aerobic   Recumbent Bike L1 x 10 min      Knee/Hip Exercises: Standing   Other Standing Knee Exercises standing HS curl 3lbs 3x5      Knee/Hip Exercises: Seated   Long Arc Quad Limitations 2x10 2s pause    Sit to [employer] 10 reps      Knee/Hip Exercises: Supine    Quad Sets 15 reps    Heel Slides Right;10 reps    Straight Leg Raises 20 reps;Right      Manual Therapy   Joint Mobilization R knee TKE grade I-II mob                      PT Short Term Goals - 04/14/21 1320       PT SHORT TERM GOAL #1   Title Pt will become independent with HEP in order to demonstrate synthesis of PT education.    Time 4    Period Weeks    Status New      PT SHORT TERM GOAL #2   Title Pt will be able to perform 5XSTS in under 12  in order to demonstrate functional improvement above the cut off score for older adults.    Time 4    Period Weeks    Status New               PT Long Term Goals -  04/14/21 1333       PT LONG TERM GOAL #1   Title Pt will become independent with final HEP in order to demonstrate synthesis of PT education.      PT LONG TERM GOAL #2   Title Pt will be able to demonstrate TUG in under 10 sec in order to demonstrate functional improvement in LE function, strength, balance, and mobility for safety with community ambulation.    Time 8    Period Weeks    Status New      PT LONG TERM GOAL #3   Title Pt will be able to reach [name] and grab/reach/hold >5 lbs in order to demonstrate functional improvement in L/R UE function.    Time 8    Period Weeks    Status New      PT LONG TERM GOAL #4   Title Pt will be able to demonstrate full symmetrical knee and L shoulder ROM and strength in order to demonstrate functional improvement in UE/LE function for return to full self-care and house hold duties.    Time 8    Period Weeks    Status New                   Plan - 05/10/21 1523     Clinical Impression Statement Pt overall having much dereased pain and improving function. She has a bit more difficulty with TKE today than in previous sessions, but still much improved from previously. Reviewed HEP and importance of continuing strengthening. Pt with improving gait mechanics and speed noted. She will benefit from  continued strengthening. She is going to out of town next week, and will return after that. Expect to work towards d/c in next couple weeks if pt still doing well.    Personal Factors and Comorbidities Age;Fitness;Comorbidity 1;Comorbidity 2    Examination-Activity Limitations Bathing;Lift;Stand;Locomotion Level;Transfers;Bed Mobility;Stairs;Squat;Dressing;Reach Overhead    Examination-Participation Restrictions Community Activity;Laundry;Shop;Interpersonal Relationship;Volunteer    Stability/Clinical Decision Making Evolving/Moderate complexity    Rehab Potential Good    PT Frequency 2x / week    PT Duration 8 weeks    PT Treatment/Interventions ADLs/Self Care Home Management;Aquatic Therapy;Cryotherapy;Electrical Stimulation;Iontophoresis 4mg /ml Dexamethasone;Moist Heat;Traction;Ultrasound;Stair training;Gait training;Functional mobility training;Therapeutic activities;Therapeutic exercise;Balance training;Neuromuscular re-education;Patient/family education;Orthotic Fit/Training;Manual techniques;Taping;Energy conservation;Dry needling;Passive range of motion;Scar mobilization;Spinal Manipulations;Joint Manipulations;Vasopneumatic Device    PT Next Visit Plan review HEP, trial quad set again, seated HS stretch, LAD, recumbent bike, active HS stretch    PT Home Exercise Plan [email or web address]    Consulted and Agree with Plan of Care Patient             Patient will benefit from skilled therapeutic intervention in order to improve the following deficits and impairments:  Abnormal gait, Pain, Improper body mechanics, Impaired flexibility, Difficulty walking, Increased edema, Decreased balance, Decreased activity tolerance, Decreased endurance, Decreased strength, Hypomobility, Impaired UE functional use, Increased muscle spasms, Decreased mobility  Visit Diagnosis: Pain in joint of right knee  Left shoulder pain, unspecified chronicity  Difficulty walking  Muscle weakness  (generalized)     Problem List Patient Active Problem List   Diagnosis Date Noted   Depression, recurrent (HCC) 02/20/2020   Prediabetes 01/26/2019   History of exercise intolerance 01/26/2019   HLD (hyperlipidemia) 04/07/2018   DJD (degenerative joint disease) 02/09/2010   PVCs (premature ventricular contractions) 07/20/2009   Anxiety and depression 07/16/2009   Cardiomyopathy (HCC) 04/23/2007   HTN (hypertension) 04/15/2007   Osteopenia 04/15/2007   06/15/2007, PT, DPT 3:25 PM  05/10/21  [facility] [address] [place], [place], [address] Phone: [phone]   Fax:  [phone]  Name: [name] [name] MRN: [id number] Date of Birth: 03/06/1927

## 2021-05-19 ENCOUNTER — Encounter: Payer: Medicare PPO | Admitting: Physical Therapy

## 2021-05-20 NOTE — Progress Notes (Deleted)
   I, Philbert Riser, LAT, ATC acting as a scribe for [name], MD.  [name] [name] is a 87 y.o. female who presents to [facility] today for f/u R knee and L shoulder pain. Pt suffered a fall back in Feb landing L side. Pt was last seen by Dr. Denyse Amass on 04/05/21 and was given a R knee steroid injection, advised to use Voltaren gel, and was referred to PT, of which she's completed 8 visits. Today, pt reports  Dx imaging: 04/02/13 C-spine XR  Pertinent review of systems: ***  Relevant historical information: ***   Exam:  LMP  (LMP Unknown)  General: Well Developed, well nourished, and in no acute distress.   MSK: ***    Lab and Radiology Results No results found for this or any previous visit (from the past 72 hour(s)). No results found.     Assessment and Plan: 87 y.o. female with ***   PDMP not reviewed this encounter. No orders of the defined types were placed in this encounter.  No orders of the defined types were placed in this encounter.    Discussed warning signs or symptoms. Please see discharge instructions. Patient expresses understanding.   ***

## 2021-05-23 ENCOUNTER — Ambulatory Visit: Payer: Medicare PPO | Admitting: Family Medicine

## 2021-05-24 ENCOUNTER — Ambulatory Visit: Payer: Medicare PPO | Admitting: Physical Therapy

## 2021-05-24 ENCOUNTER — Other Ambulatory Visit: Payer: Self-pay

## 2021-05-24 ENCOUNTER — Encounter: Payer: Self-pay | Admitting: Physical Therapy

## 2021-05-24 DIAGNOSIS — M25561 Pain in right knee: Secondary | ICD-10-CM

## 2021-05-24 DIAGNOSIS — M25512 Pain in left shoulder: Secondary | ICD-10-CM | POA: Diagnosis not present

## 2021-05-24 NOTE — Patient Instructions (Signed)
Access Code: [email or web address] URL: [email or web address] Date: 05/24/2021 Prepared by: Sedalia Muta  Exercises Standing Shoulder Posterior Capsule Stretch - 1 x daily - 7 x weekly - 3 sets - 10 reps Seated Cervical Sidebending Stretch - 2 x daily - 3 reps - 30 hold Seated Hamstring Stretch - 2 x daily - 3 reps - 30 hold Supine Heel Slide - 1 x daily - 1-2 sets - 10 reps Supine Quad Set - 1-2 x daily - 1 sets - 10 reps Long Sitting Quad Set - 1 x daily - 2 sets - 10 reps Straight Leg Raise - 1 x daily - 1-2 sets - 10 reps Seated Long Arc Quad - 2 x daily - 7 x weekly - 2 sets - 10 reps - 5 hold Heel toe Rocking - 1 x daily - 7 x weekly - 3 sets - 10 reps

## 2021-05-24 NOTE — Therapy (Signed)
[place] [address] [place], [place], [address] Phone: [phone]   Fax:  [phone]  Physical Therapy Treatment  Patient Details  Name: [name] [name] MRN: [id number] Date of Birth: [birth year] Referring Provider (PT): Dr. Georgina Snell   Encounter Date: 05/24/2021   PT End of Session - 05/24/21 1543     Visit Number 9    Number of Visits 17    Date for PT Re-Evaluation 07/13/21    Authorization Type [employer]    PT Start Time 2297    PT Stop Time 1430    PT Time Calculation (min) 45 min    Activity Tolerance Patient limited by pain;Patient tolerated treatment well    Behavior During Therapy [facility] for tasks assessed/performed             Past Medical History:  Diagnosis Date   Anemia    Anxiety and depression 07/16/2009   ASD (atrial septal defect) per bubble study, 2004    Cardiomyopathy, normal cath 2007, normal EF per ECHO 2014    Hx of colonic polyps    Hypertension    Osteoarthritis    Osteopenia    Podagra 04/29/2014   RLS (restless legs syndrome) 07/10/2009    Past Surgical History:  Procedure Laterality Date   BACK SURGERY  2000   BREAST SURGERY     NEGATIVE BIOPSY   TONSILLECTOMY      There were no vitals filed for this visit.   Subjective Assessment - 05/24/21 1542     Subjective Pt states pain much better. Only "feels it a little bit" when doing up stairs. She has been able to do HEP and go up/down stairs at home. She does have questions on bracing, and amount of exercise to resume.    Currently in Pain? No/denies    Pain Score 0-No pain                [facility] PT Assessment - 05/24/21 0001       AROM   Overall AROM Comments shoulder: WFL, behind the back IR wfl      Strength   Overall Strength Comments shoulder: 4 to 4+/5 on L;                           [facility] Adult PT Treatment/Exercise - 05/24/21 0001       Ambulation/Gait   Gait Comments 35 ft x 8       Knee/Hip Exercises: Stretches   Active Hamstring Stretch 2 reps;30 seconds;Both      Knee/Hip Exercises: Aerobic   Recumbent Bike L1 x 8 min      Knee/Hip Exercises: Standing   Stairs up/down 5 steps, 1 HR, recipricol      Knee/Hip Exercises: Seated   Long Arc Quad Limitations 2x10 2s pause    Sit to [employer] 5 reps      Knee/Hip Exercises: Supine   Quad Sets 20 reps    Quad Sets Limitations longsitting for improved knee extension and contraction    Heel Slides Right;10 reps    Straight Leg Raises 20 reps;Right      Manual Therapy   Passive ROM for knee extension                    PT Education - 05/24/21 1542     Education Details Discussed brace wearing, icing, stair climbing, and best exercises for knee, given these recommendations  in written form .    Person(s) Educated Patient    Methods Explanation;Demonstration;Tactile cues;Verbal cues;Handout    Comprehension Verbalized understanding;Returned demonstration;Verbal cues required;Tactile cues required;Need further instruction              PT Short Term Goals - 05/24/21 1546       PT SHORT TERM GOAL #1   Title Pt will become independent with HEP in order to demonstrate synthesis of PT education.    Time 4    Period Weeks    Status Achieved      PT SHORT TERM GOAL #2   Title Pt will be able to perform 5XSTS in under 12  in order to demonstrate functional improvement above the cut off score for older adults.    Time 4    Period Weeks    Status New               PT Long Term Goals - 04/14/21 1333       PT LONG TERM GOAL #1   Title Pt will become independent with final HEP in order to demonstrate synthesis of PT education.      PT LONG TERM GOAL #2   Title Pt will be able to demonstrate TUG in under 10 sec in order to demonstrate functional improvement in LE function, strength, balance, and mobility for safety with community ambulation.    Time 8    Period Weeks    Status New      PT LONG  TERM GOAL #3   Title Pt will be able to reach [facility] and grab/reach/hold >5 lbs in order to demonstrate functional improvement in L/R UE function.    Time 8    Period Weeks    Status New      PT LONG TERM GOAL #4   Title Pt will be able to demonstrate full symmetrical knee and L shoulder ROM and strength in order to demonstrate functional improvement in UE/LE function for return to full self-care and house hold duties.    Time 8    Period Weeks    Status New                   Plan - 05/24/21 1547     Clinical Impression Statement Pt has met most goals. She has minimal pain in knee, and shoulder pain is resolved. She has full shoulder ROM, no pain, and is able to reach behind back. Pt with improving function with knee, and ability for stairs as well as improved ambulation tolerance. She does have ongoing difficulty with achiving full knee extension in supine and with supine ther ex and quad sets, which has not changed signficiantly in the last few weeks. She has increased knee flexion in standing/walking bilaterally, which may have been her baseline. Pt doing much better at this time, with less pain, and is not limited with activity. Discussed having 1 more visit, will finalize HEP and questions, then plan to d/c.    Personal Factors and Comorbidities Age;Fitness;Comorbidity 1;Comorbidity 2    Examination-Activity Limitations Bathing;Lift;Stand;Locomotion Level;Transfers;Bed Mobility;Stairs;Squat;Dressing;Reach Overhead    Examination-Participation Restrictions Community Activity;Laundry;Shop;Interpersonal Relationship;Volunteer    Stability/Clinical Decision Making Evolving/Moderate complexity    Rehab Potential Good    PT Frequency 2x / week    PT Duration 8 weeks    PT Treatment/Interventions ADLs/Self Care Home Management;Aquatic Therapy;Cryotherapy;Electrical Stimulation;Iontophoresis 4mg /ml Dexamethasone;Moist Heat;Traction;Ultrasound;Stair training;Gait training;Functional mobility  training;Therapeutic activities;Therapeutic exercise;Balance training;Neuromuscular re-education;Patient/family education;Orthotic Fit/Training;Manual techniques;Taping;Energy conservation;Dry needling;Passive range of motion;Scar  mobilization;Spinal Manipulations;Joint Manipulations;Vasopneumatic Device    PT Next Visit Plan review HEP, trial quad set again, seated HS stretch, LAD, recumbent bike, active HS stretch    PT Home Exercise Plan [email or web address]    Consulted and Agree with Plan of Care Patient             Patient will benefit from skilled therapeutic intervention in order to improve the following deficits and impairments:  Abnormal gait, Pain, Improper body mechanics, Impaired flexibility, Difficulty walking, Increased edema, Decreased balance, Decreased activity tolerance, Decreased endurance, Decreased strength, Hypomobility, Impaired UE functional use, Increased muscle spasms, Decreased mobility  Visit Diagnosis: Pain in joint of right knee  Left shoulder pain, unspecified chronicity     Problem List Patient Active Problem List   Diagnosis Date Noted   Depression, recurrent ([place]) 02/20/2020   Prediabetes 01/26/2019   History of exercise intolerance 01/26/2019   HLD (hyperlipidemia) 04/07/2018   DJD (degenerative joint disease) 02/09/2010   PVCs (premature ventricular contractions) 07/20/2009   Anxiety and depression 07/16/2009   Cardiomyopathy ([place]) 04/23/2007   HTN (hypertension) 04/15/2007   Osteopenia 04/15/2007    Lyndee Hensen, PT, DPT 3:56 PM  05/24/21    [place], [place], [address] Phone: [phone]   Fax:  [phone]  Name: [name] [name] MRN: [id number] Date of Birth: 1927-04-08

## 2021-05-26 ENCOUNTER — Ambulatory Visit (INDEPENDENT_AMBULATORY_CARE_PROVIDER_SITE_OTHER): Payer: Medicare PPO | Admitting: Physical Therapy

## 2021-05-26 ENCOUNTER — Other Ambulatory Visit: Payer: Self-pay

## 2021-05-26 ENCOUNTER — Encounter: Payer: Self-pay | Admitting: Physical Therapy

## 2021-05-26 DIAGNOSIS — M25561 Pain in right knee: Secondary | ICD-10-CM | POA: Diagnosis not present

## 2021-05-26 DIAGNOSIS — R262 Difficulty in walking, not elsewhere classified: Secondary | ICD-10-CM | POA: Diagnosis not present

## 2021-05-26 DIAGNOSIS — M6281 Muscle weakness (generalized): Secondary | ICD-10-CM

## 2021-05-26 DIAGNOSIS — M25512 Pain in left shoulder: Secondary | ICD-10-CM

## 2021-05-27 ENCOUNTER — Encounter: Payer: Self-pay | Admitting: Physical Therapy

## 2021-05-27 NOTE — Therapy (Signed)
[facility] [address] [place], , [address] Phone: [phone]   Fax:  [phone]  Physical Therapy Treatment/Discharge  Patient Details  Name: [name] [name] MRN: [id number] Date of Birth: 05/01/1927 Referring Provider (PT): Dr. Corey   Encounter Date: 05/26/2021   PT End of Session - 05/26/21 1407     Visit Number 10    Number of Visits 17    Date for PT Re-Evaluation 07/13/21    Authorization Type [employer]    PT Start Time 1347    PT Stop Time 1430    PT Time Calculation (min) 43 min    Activity Tolerance Patient limited by pain;Patient tolerated treatment well    Behavior During Therapy WFL for tasks assessed/performed             Past Medical History:  Diagnosis Date   Anemia    Anxiety and depression 07/16/2009   ASD (atrial septal defect) per bubble study, 2004    Cardiomyopathy, normal cath 2007, normal EF per ECHO 2014    Hx of colonic polyps    Hypertension    Osteoarthritis    Osteopenia    Podagra 04/29/2014   RLS (restless legs syndrome) 07/10/2009    Past Surgical History:  Procedure Laterality Date   BACK SURGERY  2000   BREAST SURGERY     NEGATIVE BIOPSY   TONSILLECTOMY      There were no vitals filed for this visit.   Subjective Assessment - 05/26/21 1406     Subjective No new complaints. knee doing well.    Currently in Pain? No/denies    Pain Score 0-No pain                [facility] PT Assessment - 05/27/21 0001       AROM   Overall AROM Comments shoulder: WFL, behind the back IR wfl;  Knee extension: -10 to -5 after stretching      Strength   Overall Strength Comments shoulder: 4 to 4+/5 on L;      Timed Up and Go Test   TUG Comments 11.31                           [facility] Adult PT Treatment/Exercise - 05/27/21 0001       Ambulation/Gait   Gait Comments 5 time sit to stand: 15.66      Knee/Hip Exercises: Stretches   Active Hamstring  Stretch 2 reps;30 seconds;Both    Active Hamstring Stretch Limitations seated      Knee/Hip Exercises: Aerobic   Recumbent Bike L1 x 8 min      Knee/Hip Exercises: Standing   Stairs up/down 5 steps, 1 HR, recipricol x3      Knee/Hip Exercises: Supine   Quad Sets 20 reps    Heel Slides Right;10 reps    Straight Leg Raises Right;20 reps      Manual Therapy   Passive ROM for knee extension                    PT Education - 05/26/21 1406     Education Details Final HEP discussed.    Person(s) Educated Patient    Methods Explanation;Demonstration;Tactile cues;Handout;Verbal cues    Comprehension Verbalized understanding;Returned demonstration;Verbal cues required;Tactile cues required;Need further instruction              PT Short Term Goals - 05/26/21 1408         PT SHORT TERM GOAL #1   Title Pt will become independent with HEP in order to demonstrate synthesis of PT education.    Time 4    Period Weeks    Status Achieved      PT SHORT TERM GOAL #2   Title Pt will be able to perform 5XSTS in under 12  in order to demonstrate functional improvement above the cut off score for older adults.    Baseline 15.66 sec    Time 4    Period Weeks    Status Partially Met               PT Long Term Goals - 05/26/21 1433       PT LONG TERM GOAL #1   Title Pt will become independent with final HEP in order to demonstrate synthesis of PT education.    Status Achieved      PT LONG TERM GOAL #2   Title Pt will be able to demonstrate TUG in under 10 sec in order to demonstrate functional improvement in LE function, strength, balance, and mobility for safety with community ambulation.    Baseline 11 sec    Time 8    Period Weeks    Status Partially Met      PT LONG TERM GOAL #3   Title Pt will be able to reach OH and grab/reach/hold >5 lbs in order to demonstrate functional improvement in L/R UE function.    Time 8    Period Weeks    Status Achieved      PT  LONG TERM GOAL #4   Title Pt will be able to demonstrate full symmetrical knee and L shoulder ROM and strength in order to demonstrate functional improvement in UE/LE function for return to full self-care and house hold duties.    Baseline still lacking TKE on R.    Time 8    Period Weeks    Status Partially Met                   Plan - 05/27/21 1511     Clinical Impression Statement Pt has met most goals. She has no pain in knee, and shoulder pain is resolved. She has full shoulder ROM, no pain, and is able to reach behind back. Pt with improving function with knee, and ability for stairs as well as improved ambulation tolerance. She does have ongoing difficulty with achiving full knee extension in supine and with supine ther ex and quad sets, which has not changed signficiantly in the last few weeks. She has increased knee flexion in standing/walking bilaterally, which may have been her baseline. Pt doing much better at this time, with less pain, and is not limited with activity. Final HEP reviewed in detail today, questions answered. Pt ready for d/c to HEP, pt agrees with plan. Pt has f/u with MD on monday    Personal Factors and Comorbidities Age;Fitness;Comorbidity 1;Comorbidity 2    Examination-Activity Limitations Bathing;Lift;Stand;Locomotion Level;Transfers;Bed Mobility;Stairs;Squat;Dressing;Reach Overhead    Examination-Participation Restrictions Community Activity;Laundry;Shop;Interpersonal Relationship;Volunteer    Stability/Clinical Decision Making Evolving/Moderate complexity    Rehab Potential Good    PT Frequency 2x / week    PT Duration 8 weeks    PT Treatment/Interventions ADLs/Self Care Home Management;Aquatic Therapy;Cryotherapy;Electrical Stimulation;Iontophoresis 4mg/ml Dexamethasone;Moist Heat;Traction;Ultrasound;Stair training;Gait training;Functional mobility training;Therapeutic activities;Therapeutic exercise;Balance training;Neuromuscular  re-education;Patient/family education;Orthotic Fit/Training;Manual techniques;Taping;Energy conservation;Dry needling;Passive range of motion;Scar mobilization;Spinal Manipulations;Joint Manipulations;Vasopneumatic Device    PT Next Visit Plan review HEP, trial quad set   again, seated HS stretch, LAD, recumbent bike, active HS stretch    PT Home Exercise Plan [id number]    Consulted and Agree with Plan of Care Patient             Patient will benefit from skilled therapeutic intervention in order to improve the following deficits and impairments:  Abnormal gait, Pain, Improper body mechanics, Impaired flexibility, Difficulty walking, Increased edema, Decreased balance, Decreased activity tolerance, Decreased endurance, Decreased strength, Hypomobility, Impaired UE functional use, Increased muscle spasms, Decreased mobility  Visit Diagnosis: Pain in joint of right knee  Left shoulder pain, unspecified chronicity  Difficulty walking  Muscle weakness (generalized)     Problem List Patient Active Problem List   Diagnosis Date Noted   Depression, recurrent (HCC) 02/20/2020   Prediabetes 01/26/2019   History of exercise intolerance 01/26/2019   HLD (hyperlipidemia) 04/07/2018   DJD (degenerative joint disease) 02/09/2010   PVCs (premature ventricular contractions) 07/20/2009   Anxiety and depression 07/16/2009   Cardiomyopathy (HCC) 04/23/2007   HTN (hypertension) 04/15/2007   Osteopenia 04/15/2007    Lauren Carroll, PT, DPT 3:14 PM  05/27/21    [facility] [address] [place], [place], [address] Phone: [phone]   Fax:  [phone]  Name: [name] [name] MRN: [id number] Date of Birth: 08/14/1927  PHYSICAL THERAPY DISCHARGE SUMMARY  Visits from Start of Care: 10    Plan: Patient agrees to discharge.  Patient goals were met. Patient is being discharged due to meeting the stated rehab goals.     Lauren Carroll,  PT, DPT 3:15 PM  05/27/21      

## 2021-05-27 NOTE — Progress Notes (Signed)
I, [name] [name], LAT, ATC acting as a scribe for [name] Graham, MD.  [name] [name] is a 87 y.o. female who presents to [facility] today for f/u R knee pain thought to be due to DJD and L shoulder pain thought to be due to [facility] tendinopathy/impingement. Pt was last seen by Dr. Denyse Amass on 04/05/21 and was given a R knee steroid injection and advised to use Voltaren gel. Pt was also referred to PT of which she's completed 10 visits. Today, pt reports that both the knee and shoulder are doing better. Left knee started bothering patient one week ago. Pain throughout the entire joint with weight bearing. Painful to perform knee flexion. Injection did help R knee.   Dx imaging: 04/02/13 C-spine XR  Pertinent review of systems: No fevers or chills  Relevant historical information: Hypertension.  Right knee DJD   Exam:  BP (!) 132/92   Pulse 76   Ht 5\' 5"  (1.651 m)   Wt 139 lb (63 kg)   LMP  (LMP Unknown)   SpO2 98%   BMI 23.13 kg/m  General: Well Developed, well nourished, and in no acute distress.   MSK: Left knee moderate effusion normal motion with crepitation.  Tender palpation medial and lateral joint line.  Stable ligamentous exam    Lab and Radiology Results  Procedure: Real-time Ultrasound Guided Injection of left knee superior lateral patellar space Device: Philips Affiniti 50G Images permanently stored and available for review in PACS Ultrasound evaluation prior to injection reveals moderate effusion.  Narrowed joint lines medial and lateral.  No Baker's cyst. Verbal informed consent obtained.  Discussed risks and benefits of procedure. Warned about infection bleeding damage to structures skin hypopigmentation and fat atrophy among others. Patient expresses understanding and agreement Time-out conducted.   Noted no overlying erythema, induration, or other signs of local infection.   Skin prepped in a sterile fashion.   Local anesthesia:  Topical Ethyl chloride.   With sterile technique and under real time ultrasound guidance: 40 mg of Kenalog and 2 mL of Marcaine injected into knee joint. Fluid seen entering the joint capsule.   Completed without difficulty   Pain immediately resolved suggesting accurate placement of the medication.   Advised to call if fevers/chills, erythema, induration, drainage, or persistent bleeding.   Images permanently stored and available for review in the ultrasound unit.  Impression: Technically successful ultrasound guided injection.     Assessment and Plan: 87 y.o. female with left knee pain due to very likely to DJD.  She has contralateral right knee DJD on prior x-ray.  Plan for steroid injection left knee as she is done quite well with this for her right knee.  Also prescribed Voltaren gel.  Recheck as needed.  Will obtain x-ray eventually if needed however doubtful that x-rays can change her plan very much today.   PDMP not reviewed this encounter. Orders Placed This Encounter  Procedures   82 LIMITED JOINT SPACE STRUCTURES LOW LEFT(NO LINKED CHARGES)    Order Specific Question:   Reason for Exam (SYMPTOM  OR DIAGNOSIS REQUIRED)    Answer:   left knee pain    Order Specific Question:   Preferred imaging location?    Answer:    [facility] ordered this encounter  Medications   diclofenac Sodium (VOLTAREN) 1 % GEL    Sig: Apply 4 g topically 4 (four) times daily. To affected joint.    Dispense:  100 g    Refill:  11     Discussed warning signs or symptoms. Please see discharge instructions. Patient expresses understanding.   The above documentation has been reviewed and is accurate and complete [name] [name], M.D.

## 2021-05-30 ENCOUNTER — Other Ambulatory Visit: Payer: Self-pay

## 2021-05-30 ENCOUNTER — Encounter: Payer: Self-pay | Admitting: Family Medicine

## 2021-05-30 ENCOUNTER — Ambulatory Visit (INDEPENDENT_AMBULATORY_CARE_PROVIDER_SITE_OTHER): Payer: Medicare PPO | Admitting: Family Medicine

## 2021-05-30 ENCOUNTER — Ambulatory Visit: Payer: Self-pay

## 2021-05-30 VITALS — BP 132/92 | HR 76 | Ht 65.0 in | Wt 139.0 lb

## 2021-05-30 DIAGNOSIS — M25562 Pain in left knee: Secondary | ICD-10-CM | POA: Diagnosis not present

## 2021-05-30 DIAGNOSIS — M1712 Unilateral primary osteoarthritis, left knee: Secondary | ICD-10-CM | POA: Diagnosis not present

## 2021-05-30 MED ORDER — DICLOFENAC SODIUM 1 % EX GEL: 4.0000 g | Freq: Four times a day (QID) | CUTANEOUS | 11 refills | Status: DC

## 2021-05-30 NOTE — Patient Instructions (Addendum)
Thank you for coming in today.   Call or go to the ER if you develop a large red swollen joint with extreme pain or oozing puss.    Please use Voltaren gel (Generic Diclofenac Gel) up to 4x daily for pain as needed.  This is available over-the-counter as both the name brand Voltaren gel and the generic diclofenac gel.   Continue the home exercises. If you think you need more PT just let me know.   Recheck with me as needed.   Please contact the [facility] office to establish care with a new doctor or PA in that practice. Your kidneys need a recheck.

## 2021-05-31 ENCOUNTER — Encounter: Payer: Self-pay | Admitting: Family Medicine

## 2021-06-06 NOTE — Telephone Encounter (Signed)
Patient is scheduled   

## 2021-06-21 ENCOUNTER — Other Ambulatory Visit: Payer: Self-pay

## 2021-06-21 ENCOUNTER — Encounter: Payer: Self-pay | Admitting: Physician Assistant

## 2021-06-21 ENCOUNTER — Telehealth (INDEPENDENT_AMBULATORY_CARE_PROVIDER_SITE_OTHER): Payer: Medicare PPO | Admitting: Physician Assistant

## 2021-06-21 VITALS — Ht 65.0 in | Wt 138.9 lb

## 2021-06-21 DIAGNOSIS — N1832 Chronic kidney disease, stage 3b: Secondary | ICD-10-CM

## 2021-06-21 DIAGNOSIS — U071 COVID-19: Secondary | ICD-10-CM

## 2021-06-21 MED ORDER — MOLNUPIRAVIR EUA 200MG CAPSULE: 4.0000 | ORAL_CAPSULE | Freq: Two times a day (BID) | ORAL | 0 refills | Status: AC

## 2021-06-21 NOTE — Progress Notes (Signed)
Virtual Visit via Video Note  I connected with [name] [name] on 06/21/21 at  9:30 AM EDT by a video enabled telemedicine application and verified that I am speaking with the correct person using two identifiers.  Location: Patient: home Provider: Nature conservation officer at [employer] Persons present: Patient, her daughter, and myself   I discussed the limitations of evaluation and management by telemedicine and the availability of in person appointments. The patient expressed understanding and agreed to proceed.   History of Present Illness: Chief complaint: COVID+ 06/20/21 via home antigen test  Symptom onset: 06/18/21 started with cough and runny nose Pertinent positives: Deep cough, diarrhea, fatigue, scratchy throat Pertinent negatives: SOB, CP, headache, dizziness, body aches or chills Treatments tried: Mucinex DM  Vaccine status: Pfizer x 2 plus two boosters Sick exposure: Daughter and granddaughter    Observations/Objective:  Gen: Awake, alert, no acute distress, some congestion; sitting upright at dining table Resp: Breathing is even and non-labored Psych: calm/pleasant demeanor Neuro: Alert and Oriented x 3, + facial symmetry, speech is clear.  Assessment and Plan: 1. COVID-19 2. Chronic kidney disease, stage 3b (HCC) Diagnosis confirmed via home antigen test.  Patient is currently having mild symptoms.  We discussed current algorithm recommendations for treatment.  She is over the age of 65, has chronic kidney disease with her last GFR of 32 back on 08/23/2020, but she is vaccinated.  We discussed antibody infusion versus oral antivirals.  Risks and benefits of each one discussed.  Obviously Paxlovid is not an option given her renal status.  We ultimately agreed on starting the oral antiviral Molnupiravir, as she is still in the 5 day window. Possible side effects of diarrhea, headache and dizziness discussed.  Advised self-isolation at home for the next 5 days and  then masking around others for at least an additional 5 days.  Treat supportively at this time as well, including sleeping prone, deep breathing exercises, pushing fluids, walking every few hours, vitamins C and D, and Tylenol or ibuprofen as needed.  The patient understands that COVID-19 illness can wax and wane.  Should the symptoms acutely worsen or patient starts to experience sudden shortness of breath, chest pain, severe weakness, the patient will go straight to the emergency department.  Also advised home pulse oximetry monitoring and for any reading consistently under 92%, should also report to the emergency department.  The patient will continue to keep [place] updated.  Follow Up Instructions:    I discussed the assessment and treatment plan with the patient. The patient was provided an opportunity to ask questions and all were answered. The patient agreed with the plan and demonstrated an understanding of the instructions.   The patient was advised to call back or seek an in-person evaluation if the symptoms worsen or if the condition fails to improve as anticipated.  This note was prepared with assistance of Conservation officer, historic buildings. Occasional wrong-word or sound-a-like substitutions may have occurred due to the inherent limitations of voice recognition software.   Aaliyha Mumford M Sharine Cadle, PA-C

## 2021-06-21 NOTE — Patient Instructions (Signed)
Please take the medication as directed.  Send a MyChart message with an update for me by Friday this week.  If at any point you start to feel acutely worse, do not hesitate to go to the hospital.

## 2021-06-24 ENCOUNTER — Encounter: Payer: Self-pay | Admitting: Physician Assistant

## 2021-07-04 ENCOUNTER — Encounter: Payer: Self-pay | Admitting: Physician Assistant

## 2021-07-04 ENCOUNTER — Other Ambulatory Visit: Payer: Self-pay

## 2021-07-04 ENCOUNTER — Ambulatory Visit (INDEPENDENT_AMBULATORY_CARE_PROVIDER_SITE_OTHER)
Admission: RE | Admit: 2021-07-04 | Discharge: 2021-07-04 | Disposition: A | Discharge status: Home / Self Care | Payer: Medicare PPO | Source: Ambulatory Visit | Attending: Physician Assistant | Admitting: Physician Assistant

## 2021-07-04 ENCOUNTER — Ambulatory Visit: Payer: Medicare PPO | Admitting: Physician Assistant

## 2021-07-04 VITALS — BP 152/77 | HR 77 | Temp 97.6°F | Ht 65.0 in | Wt 139.2 lb

## 2021-07-04 DIAGNOSIS — R058 Other specified cough: Secondary | ICD-10-CM

## 2021-07-04 DIAGNOSIS — U099 Post covid-19 condition, unspecified: Secondary | ICD-10-CM

## 2021-07-04 DIAGNOSIS — R059 Cough, unspecified: Secondary | ICD-10-CM | POA: Diagnosis not present

## 2021-07-04 DIAGNOSIS — I10 Essential (primary) hypertension: Secondary | ICD-10-CM | POA: Diagnosis not present

## 2021-07-04 DIAGNOSIS — F3340 Major depressive disorder, recurrent, in remission, unspecified: Secondary | ICD-10-CM | POA: Diagnosis not present

## 2021-07-04 NOTE — Patient Instructions (Signed)
Good to see you today!  Please go to [place] at [facility] for a chest xray. I will call with results and treatment plan from there.

## 2021-07-04 NOTE — Progress Notes (Signed)
Established Patient Office Visit  Subjective:  Patient ID: [name] [name], female    DOB: 1927/05/05  Age: 87 y.o. MRN: [id number]  CC:  Chief Complaint  Patient presents with   Cough    Congestion August 3rd    HPI [name] [name] presents, with her daughter, for cough and congestion since approx 06/15/21.  I saw the patient on 06/21/2021 as a virtual video visit for COVID-19 infection.  She was started on Molnupiravir and feels like she benefited from it. She continues to have deep, productive cough, and sinus congestion. Denies any fever, chills, SOB, chest pain, ear pain, or ST. Still somewhat fatigued. Has been taking OTC Mucinex DM.  Past Medical History:  Diagnosis Date   Anemia    Anxiety and depression 07/16/2009   ASD (atrial septal defect) per bubble study, 2004    Cardiomyopathy, normal cath 2007, normal EF per ECHO 2014    Hx of colonic polyps    Hypertension    Osteoarthritis    Osteopenia    Podagra 04/29/2014   RLS (restless legs syndrome) 07/10/2009    Past Surgical History:  Procedure Laterality Date   BACK SURGERY  2000   BREAST SURGERY     NEGATIVE BIOPSY   TONSILLECTOMY      Family History  Problem Relation Age of Onset   Heart attack Father    Heart disease Mother    Hypertension Other    Diabetes Neg Hx    Colon cancer Neg Hx     Social History   Socioeconomic History   Marital status: Widowed    Spouse name: Not on file   Number of children: 1   Years of education: Not on file   Highest education level: Not on file  Occupational History   Occupation: Motivational Speaker    Employer: RETIRED  Tobacco Use   Smoking status: Never   Smokeless tobacco: Never  Substance and Sexual Activity   Alcohol use: No    Alcohol/week: 0.0 standard drinks   Drug use: No   Sexual activity: Not Currently  Other Topics Concern   Not on file  Social History Narrative   Widow, moved to [facility] 2007. Has a daughter Vikki Ports and a G-d in [place]; lost  her son-in-law, moved w/ daughter 2014 due to depression.   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[place], [place]    Social Determinants of Health   Financial Resource Strain: Low Risk    Difficulty of Paying Living Expenses: Not hard at all  Food Insecurity: No Food Insecurity   Worried About Programme researcher, broadcasting/film/video in the Last Year: Never true   Barista in the Last Year: Never true  Transportation Needs: No Transportation Needs   Lack of Transportation (Medical): No   Lack of Transportation (Non-Medical): No  Physical Activity: Sufficiently Active   Days of Exercise per Week: 5 days   Minutes of Exercise per Session: 30 min  Stress: No Stress Concern Present   Feeling of Stress : Not at all  Social Connections: Socially Isolated   Frequency of Communication with Friends and Family: More than three times a week   Frequency of Social Gatherings with Friends and Family: Once a week   Attends Religious Services: Never   Database administrator or Organizations: No   Attends Banker Meetings: Never   Marital Status: Widowed  Intimate Partner Violence: Not At Risk   Fear of Current or Ex-Partner:  No   Emotionally Abused: No   Physically Abused: No   Sexually Abused: No    Outpatient Medications Prior to Visit  Medication Sig Dispense Refill   buPROPion (WELLBUTRIN XL) 150 MG 24 hr tablet Take 450 mg by mouth every morning.      carvedilol (COREG) 25 MG tablet TAKE 1 TABLET BY MOUTH 2 TIMES DAILY WITH A MEAL. 180 tablet 1   diclofenac Sodium (VOLTAREN) 1 % GEL Apply 4 g topically 4 (four) times daily. To affected joint. 100 g 11   ENTRESTO 49-51 MG TAKE 1 TABLET BY MOUTH TWICE A DAY 60 tablet 7   furosemide (LASIX) 20 MG tablet TAKE 1 TABLET BY MOUTH EVERY OTHER DAY 45 tablet 3   latanoprost (XALATAN) 0.005 % ophthalmic solution Place 1 drop into both eyes every evening.     mirtazapine (REMERON) 30 MG tablet Take 30 mg by mouth at bedtime.      Multiple Vitamin  (MULTIVITAMIN) tablet Take 1 tablet by mouth every morning.     polyethylene glycol (MIRALAX / GLYCOLAX) packet Take 17 g by mouth 3 (three) times a week. Mon, wed, and fri     spironolactone (ALDACTONE) 25 MG tablet TAKE 1 TABLET BY MOUTH EVERY OTHER DAY 45 tablet 3   VYZULTA 0.024 % SOLN SMARTSIG:1 Drop(s) In Eye(s) Every Evening     No facility-administered medications prior to visit.    Allergies  Allergen Reactions   Shellfish Allergy Anaphylaxis   Codeine Other (See Comments)    Unknown.   Hydrocodone Other (See Comments)    Unknown.   Adhesive [Tape] Rash    ROS Review of Systems REFER TO HPI FOR PERTINENT POSITIVES AND NEGATIVES    Objective:    Physical Exam Vitals and nursing note reviewed.  Constitutional:      General: She is not in acute distress.    Appearance: Normal appearance. She is normal weight.  HENT:     Head: Normocephalic.     Right Ear: Tympanic membrane, ear canal and external ear normal.     Left Ear: Tympanic membrane, ear canal and external ear normal.     Nose: Nose normal.     Mouth/Throat:     Mouth: Mucous membranes are moist.  Eyes:     Extraocular Movements: Extraocular movements intact.     Conjunctiva/sclera: Conjunctivae normal.     Pupils: Pupils are equal, round, and reactive to light.  Cardiovascular:     Rate and Rhythm: Normal rate and regular rhythm.     Pulses: Normal pulses.     Heart sounds: No murmur heard. Pulmonary:     Effort: Pulmonary effort is normal.     Breath sounds: Normal breath sounds.  Abdominal:     Tenderness: There is no abdominal tenderness.  Musculoskeletal:        General: Normal range of motion.     Cervical back: Normal range of motion.  Skin:    General: Skin is warm.  Neurological:     General: No focal deficit present.     Mental Status: She is alert and oriented to person, place, and time.     Gait: Gait normal.  Psychiatric:        Mood and Affect: Mood normal.        Behavior:  Behavior normal.    BP (!) 152/77   Pulse 77   Temp 97.6 F (36.4 C)   Ht 5\' 5"  (1.651 m)   Wt 139  lb 3.2 oz (63.1 kg)   LMP  (LMP Unknown)   SpO2 98%   BMI 23.16 kg/m  Wt Readings from Last 3 Encounters:  07/04/21 139 lb 3.2 oz (63.1 kg)  06/21/21 138 lb 14.2 oz (63 kg)  05/30/21 139 lb (63 kg)     Health Maintenance Due  Topic Date Due   COVID-19 Vaccine (5 - Booster for Pfizer series) 07/03/2021   INFLUENZA VACCINE  06/13/2021    There are no preventive care reminders to display for this patient.  Lab Results  Component Value Date   TSH 1.80 10/25/2018   Lab Results  Component Value Date   WBC 4.1 08/23/2020   HGB 11.3 (L) 08/23/2020   HCT 34.2 (L) 08/23/2020   MCV 98.3 08/23/2020   PLT 173 08/23/2020   Lab Results  Component Value Date   NA 135 08/23/2020   K 4.2 08/23/2020   CO2 24 08/23/2020   GLUCOSE 102 (H) 08/23/2020   BUN 33 (H) 08/23/2020   CREATININE 1.61 (H) 08/23/2020   BILITOT 0.3 08/23/2020   ALKPHOS 49 02/20/2020   AST 17 08/23/2020   ALT 9 08/23/2020   PROT 6.7 08/23/2020   ALBUMIN 4.4 02/20/2020   CALCIUM 9.7 08/23/2020   GFR 40.66 (L) 02/20/2020   Lab Results  Component Value Date   CHOL 188 08/23/2020   Lab Results  Component Value Date   HDL 61 08/23/2020   Lab Results  Component Value Date   LDLCALC 103 (H) 08/23/2020   Lab Results  Component Value Date   TRIG 137 08/23/2020   Lab Results  Component Value Date   CHOLHDL 3.1 08/23/2020   Lab Results  Component Value Date   HGBA1C 5.8 (H) 08/23/2020      Assessment & Plan:   Problem List Items Addressed This Visit   None Visit Diagnoses     Post-COVID-19 condition    -  Primary   Relevant Orders   DG Chest 2 View (Completed)   Productive cough       Relevant Orders   DG Chest 2 View (Completed)   Essential hypertension           No orders of the defined types were placed in this encounter.  1. Post-COVID-19 condition 2. Productive cough 3.  Essential hypertension -Reassured most likely postviral symptoms that may linger -Will obtain CXR to r/o underlying pathology -Continue to use nasal saline, fluids, humidifier, tea with honey and lemon -OTC PLAIN Mucinex. I think the DM is raising her BP. She will continue to monitor at home.   Follow-up: No follow-ups on file.    Jeven Topper M Tequita Marrs, PA-C

## 2021-07-11 ENCOUNTER — Other Ambulatory Visit: Payer: Self-pay | Admitting: Physician Assistant

## 2021-07-11 MED ORDER — CEFPROZIL 250 MG PO TABS: 250.0000 mg | ORAL_TABLET | Freq: Two times a day (BID) | ORAL | 0 refills | Status: AC

## 2021-07-11 MED ORDER — PREDNISONE 20 MG PO TABS: 20.0000 mg | ORAL_TABLET | Freq: Two times a day (BID) | ORAL | 0 refills | Status: AC

## 2021-07-12 ENCOUNTER — Ambulatory Visit: Payer: Medicare PPO | Admitting: Physician Assistant

## 2021-07-15 ENCOUNTER — Telehealth: Payer: Self-pay

## 2021-07-15 NOTE — Telephone Encounter (Signed)
Noted, thank you

## 2021-07-15 NOTE — Telephone Encounter (Signed)
FYI, see message. 

## 2021-07-15 NOTE — Telephone Encounter (Signed)
Patient daughter states patient is doing very well since starting new medications less coughing less flem and mucus and energy has returned

## 2021-07-18 ENCOUNTER — Other Ambulatory Visit: Payer: Self-pay | Admitting: Family Medicine

## 2021-07-18 DIAGNOSIS — I10 Essential (primary) hypertension: Secondary | ICD-10-CM

## 2021-07-20 ENCOUNTER — Ambulatory Visit: Payer: Medicare PPO | Admitting: Family Medicine

## 2021-07-20 ENCOUNTER — Ambulatory Visit: Payer: Self-pay

## 2021-07-20 ENCOUNTER — Other Ambulatory Visit: Payer: Self-pay

## 2021-07-20 VITALS — BP 112/70 | HR 80 | Ht 65.0 in | Wt 138.4 lb

## 2021-07-20 DIAGNOSIS — M25561 Pain in right knee: Secondary | ICD-10-CM | POA: Diagnosis not present

## 2021-07-20 NOTE — Patient Instructions (Signed)
Thank you for coming in today.   Please use Voltaren gel (Generic Diclofenac Gel) up to 4x daily for pain as needed.  This is available over-the-counter as both the name brand Voltaren gel and the generic diclofenac gel.   Return as needed.

## 2021-07-20 NOTE — Progress Notes (Signed)
I, Philbert Riser, LAT, ATC acting as a scribe for [name], MD.  [name] [name] is a 87 y.o. female who presents to [facility] at [facility] today for R knee pain. Pt previously saw Dr. Denyse Amass on 05/30/21 and received a L knee steroid injection. Today, pt notice the R knee starting hurting about over this past week. Pt c/o the pain worsening to the point where it's waking her up at night. Pt locates pain to all over R knee joint.  R knee swelling: yes Mechanical symptoms: no Aggravates: walking, trying to sleep Treatments tried: cream  Dx imaging: 04/05/21 R knee XR  Pertinent review of systems: No fevers or chills  Relevant historical information: Hypertension   Exam:  BP 112/70   Pulse 80   Ht 5\' 5"  (1.651 m)   Wt 138 lb 6.4 oz (62.8 kg)   LMP  (LMP Unknown)   SpO2 95%   BMI 23.03 kg/m  General: Well Developed, well nourished, and in no acute distress.   MSK: Right knee moderate effusion normal-appearing otherwise normal motion with crepitation.    Lab and Radiology Results  Procedure: Real-time Ultrasound Guided Injection of right knee superior lateral patellar space Device: Philips Affiniti 50G Images permanently stored and available for review in PACS Verbal informed consent obtained.  Discussed risks and benefits of procedure. Warned about infection bleeding damage to structures skin hypopigmentation and fat atrophy among others. Patient expresses understanding and agreement Time-out conducted.   Noted no overlying erythema, induration, or other signs of local infection.   Skin prepped in a sterile fashion.   Local anesthesia: Topical Ethyl chloride.   With sterile technique and under real time ultrasound guidance: 40 mg of Kenalog and 2 mL of Marcaine injected into knee joint. Fluid seen entering the joint capsule.   Completed without difficulty   Pain immediately resolved suggesting accurate placement of the medication.   Advised to  call if fevers/chills, erythema, induration, drainage, or persistent bleeding.   Images permanently stored and available for review in the ultrasound unit.  Impression: Technically successful ultrasound guided injection.    EXAM: RIGHT KNEE 3 VIEWS   COMPARISON:  None.   FINDINGS: Mild diffuse joint space narrowing. Mild tricompartmental peripheral spurring. No acute or healing fracture. Patella normally situated in the trochlear groove. Minimal spurring of the tibial spines. There is faint medial and lateral chondrocalcinosis. Small quadriceps tendon enthesophyte. Mild subchondral cystic change in the patella. No significant joint effusion.   IMPRESSION: Mild tricompartmental osteoarthritis with chondrocalcinosis.     Electronically Signed   By: M.D.   On: 04/06/2021 22:24 I, 04/08/2021, personally (independently) visualized and performed the interpretation of the images attached in this note.      Assessment and Plan: 87 y.o. female with right knee pain due to exacerbation of DJD.  Plan for steroid injection today.  Recommend also Voltaren gel and Tylenol arthritis.  Exercise as tolerated.  Recheck back as needed.  I can repeat this injection every 3 months if needed.   PDMP not reviewed this encounter. Orders Placed This Encounter  Procedures   99 LIMITED JOINT SPACE STRUCTURES LOW RIGHT(NO LINKED CHARGES)    Standing Status:   Future    Number of Occurrences:   1    Standing Expiration Date:   01/17/2022    Order Specific Question:   Reason for Exam (SYMPTOM  OR DIAGNOSIS REQUIRED)    Answer:   right knee pain  Order Specific Question:   Preferred imaging location?    Answer:   [facility]   No orders of the defined types were placed in this encounter.    Discussed warning signs or symptoms. Please see discharge instructions. Patient expresses understanding.   The above documentation has been reviewed and is accurate  and complete [name], M.D.

## 2021-08-15 ENCOUNTER — Ambulatory Visit: Payer: Self-pay

## 2021-08-15 ENCOUNTER — Other Ambulatory Visit: Payer: Self-pay

## 2021-08-15 ENCOUNTER — Encounter: Payer: Self-pay | Admitting: Physician Assistant

## 2021-08-15 ENCOUNTER — Ambulatory Visit: Payer: Medicare PPO | Admitting: Family Medicine

## 2021-08-15 VITALS — BP 148/98 | HR 74 | Ht 65.0 in | Wt 143.8 lb

## 2021-08-15 DIAGNOSIS — M25561 Pain in right knee: Secondary | ICD-10-CM

## 2021-08-15 DIAGNOSIS — M25562 Pain in left knee: Secondary | ICD-10-CM | POA: Diagnosis not present

## 2021-08-15 DIAGNOSIS — M1712 Unilateral primary osteoarthritis, left knee: Secondary | ICD-10-CM

## 2021-08-15 DIAGNOSIS — M170 Bilateral primary osteoarthritis of knee: Secondary | ICD-10-CM | POA: Diagnosis not present

## 2021-08-15 DIAGNOSIS — G8929 Other chronic pain: Secondary | ICD-10-CM

## 2021-08-15 DIAGNOSIS — M1711 Unilateral primary osteoarthritis, right knee: Secondary | ICD-10-CM

## 2021-08-15 NOTE — Progress Notes (Signed)
I, Philbert Riser, LAT, ATC acting as a scribe for [name] Graham, MD.  [name] [name] is a 87 y.o. female who presents to [facility] at [facility] today for R knee pain. Pt was last seen by Dr. Denyse Amass on 07/20/21 and was given a R knee steroid injection and was advised to use Voltaren gel, Tylenol, and exercise as tolerated. Today, pt reports she had been doing well, until about a week ago. Pt locates pain to all over R knee and slightly into proximal R calf.  R knee swelling: yes Treatment: Voltaren gel, ice, Tylenol  Dx imaging: 04/05/21 R knee XR  Pertinent review of systems: No fevers or chills  Relevant historical information: Hypertension.  Cardiomyopathy.   Exam:  BP (!) 148/98   Pulse 74   Ht 5\' 5"  (1.651 m)   Wt 143 lb 12.8 oz (65.2 kg)   LMP  (LMP Unknown)   SpO2 100%   BMI 23.93 kg/m  General: Well Developed, well nourished, and in no acute distress.   MSK: Right knee moderate effusion.  Normal motion with crepitation.  Tender palpation medial joint line.  Left knee moderate effusion motion with crepitation.    Lab and Radiology Results  EXAM: RIGHT KNEE 3 VIEWS   COMPARISON:  None.   FINDINGS: Mild diffuse joint space narrowing. Mild tricompartmental peripheral spurring. No acute or healing fracture. Patella normally situated in the trochlear groove. Minimal spurring of the tibial spines. There is faint medial and lateral chondrocalcinosis. Small quadriceps tendon enthesophyte. Mild subchondral cystic change in the patella. No significant joint effusion.   IMPRESSION: Mild tricompartmental osteoarthritis with chondrocalcinosis.     Electronically Signed   By: M.D.   On: 04/06/2021 22:24  EXAM: LEFT KNEE - COMPLETE 4+ VIEW   COMPARISON:  None.   FINDINGS: Chronic degenerative changes are present in the left knee with loss of height. Chondrocalcinosis is noted in the miss CHI. The knee is located. No  effusion is present. No acute or healing fractures are present.   IMPRESSION: 1. Chronic degenerative changes of the left knee. 2. No acute or healing fractures. 3. Chondrocalcinosis compatible with CPPD.     Electronically Signed   By: 04/08/2021 M.D.   On: 01/09/2021 08:11    I, 01/11/2021, personally (independently) visualized and performed the interpretation of the images attached in this note.    Assessment and Plan: 87 y.o. female with bilateral knee pain right worse than left.  Right knee pain is the major issue today.  She had a steroid injection in her right knee a month ago and unfortunately is hurting again.  We will work on authorization now for hyaluronic acid for both knees with the intention of injecting her right knee as soon as possible.  She does have chondrocalcinosis seen on x-ray which may be due to pseudogout.  I think probably the chondrocalcinosis is due to start the arthritis on x-ray.  Unfortunately I do not have great treatment options for pseudogout as she cannot take high-dose NSAIDs because of her kidney disease.  Additionally she can take colchicine because of her kidney disease and her Coreg which has a significant drug interaction with colchicine.   Plan for hyaluronic acid and schedule for injection as soon as possible. Also recommend Tylenol and Voltaren gel.   PDMP not reviewed this encounter. No orders of the defined types were placed in this encounter.  No orders of the defined types were  placed in this encounter.    Discussed warning signs or symptoms. Please see discharge instructions. Patient expresses understanding.   The above documentation has been reviewed and is accurate and complete Lynne Leader, M.D.

## 2021-08-15 NOTE — Patient Instructions (Signed)
Thank you for coming in today.   I will get the gel shots authorized.  You should hear soon about scheduling the injection.   Please use Voltaren gel (Generic Diclofenac Gel) up to 4x daily for pain as needed.  This is available over-the-counter as both the name brand Voltaren gel and the generic diclofenac gel.   Please continue tylneol.

## 2021-08-19 ENCOUNTER — Other Ambulatory Visit: Payer: Self-pay

## 2021-08-19 ENCOUNTER — Ambulatory Visit: Payer: Self-pay

## 2021-08-19 ENCOUNTER — Ambulatory Visit: Payer: Medicare PPO | Admitting: Family Medicine

## 2021-08-19 DIAGNOSIS — G8929 Other chronic pain: Secondary | ICD-10-CM | POA: Diagnosis not present

## 2021-08-19 DIAGNOSIS — M25561 Pain in right knee: Secondary | ICD-10-CM

## 2021-08-19 DIAGNOSIS — M1711 Unilateral primary osteoarthritis, right knee: Secondary | ICD-10-CM

## 2021-08-19 DIAGNOSIS — M170 Bilateral primary osteoarthritis of knee: Secondary | ICD-10-CM

## 2021-08-19 DIAGNOSIS — M25562 Pain in left knee: Secondary | ICD-10-CM | POA: Diagnosis not present

## 2021-08-19 DIAGNOSIS — M1712 Unilateral primary osteoarthritis, left knee: Secondary | ICD-10-CM

## 2021-08-19 NOTE — Patient Instructions (Addendum)
Thank you for coming in today.   You received injections in both of your knees today. Seek immediate medical attention if the joint becomes red, extremely painful, or is oozing fluid.   I recommend you obtained a compression sleeve to help with your joint problems. There are many options on the market however I recommend obtaining a knee Body Helix compression sleeve.  You can find information (including how to appropriate measure yourself for sizing) can be found at [email or web address].ch.  Many of these products are health savings account (HSA) eligible.   You can use the compression sleeve at any time throughout the day but is most important to use while being active as well as for 2 hours post-activity.   It is appropriate to ice following activity with the compression sleeve in place.   Follow-up as needed.

## 2021-08-19 NOTE — Progress Notes (Signed)
[name] presents to clinic today for Durolane injection right knee  Procedure: Real-time Ultrasound Guided Injection of right knee superior lateral patellar space Device: Philips Affiniti 50G Images permanently stored and available for review in PACS Verbal informed consent obtained.  Discussed risks and benefits of procedure. Warned about infection bleeding damage to structures skin hypopigmentation and fat atrophy among others. Patient expresses understanding and agreement Time-out conducted.   Noted no overlying erythema, induration, or other signs of local infection.   Skin prepped in a sterile fashion.   Local anesthesia: Topical Ethyl chloride.   With sterile technique and under real time ultrasound guidance: Durolane 3 mL injected into knee joint. Fluid seen entering the joint capsule.   Completed without difficulty   Advised to call if fevers/chills, erythema, induration, drainage, or persistent bleeding.   Images permanently stored and available for review in the ultrasound unit.  Impression: Technically successful ultrasound guided injection.   Lot number: [id number]  Recheck as needed.  Can proceed with contralateral left knee as needed. If this is not sufficient would consider Zilretta

## 2021-08-24 ENCOUNTER — Telehealth: Payer: Self-pay | Admitting: Family Medicine

## 2021-08-24 MED ORDER — TRAMADOL HCL 50 MG PO TABS: 50.0000 mg | ORAL_TABLET | Freq: Two times a day (BID) | ORAL | 0 refills | Status: DC | PRN

## 2021-08-24 NOTE — Telephone Encounter (Signed)
Lindsea's daughter called. She is not any better or perhaps worse after the durolane injection on Friday the 7th.   Will try Tramadol. If not improving let me know and will authorize Zilretta injection.

## 2021-08-29 ENCOUNTER — Ambulatory Visit: Payer: Self-pay

## 2021-08-29 ENCOUNTER — Ambulatory Visit: Payer: Medicare PPO | Admitting: Family Medicine

## 2021-08-29 ENCOUNTER — Other Ambulatory Visit: Payer: Self-pay

## 2021-08-29 VITALS — BP 158/104 | HR 78 | Ht 65.0 in

## 2021-08-29 DIAGNOSIS — M25561 Pain in right knee: Secondary | ICD-10-CM

## 2021-08-29 DIAGNOSIS — M1711 Unilateral primary osteoarthritis, right knee: Secondary | ICD-10-CM

## 2021-08-29 DIAGNOSIS — M25461 Effusion, right knee: Secondary | ICD-10-CM

## 2021-08-29 MED ORDER — TRAMADOL HCL 50 MG PO TABS: 50.0000 mg | ORAL_TABLET | Freq: Two times a day (BID) | ORAL | 0 refills | Status: DC | PRN

## 2021-08-29 NOTE — Progress Notes (Signed)
I, Philbert Riser, LAT, ATC acting as a scribe for [name] Graham, MD.  [name] [name] is a 87 y.o. female who presents to [facility] at [facility] today for continued R knee pain. Pt was last seen by Dr. Denyse Amass on 08/19/21 and had a Durolane injection in her R knee. Pt's daughter called the office on 08/24/21 reporting pt's R knee pain was not any better and perhaps even worse since Durolane injection and was prescribed Tramadol and was advised if not improving we could try authorizing Zilretta. Today, pt reports she has been feeling worse since the gel injection and increased in swelling.  Dx imaging: 04/05/21 R knee XR  01/07/21 L knee XR  Pertinent review of systems: No fevers or chills  Relevant historical information: Cardiomyopathy.   Exam:  BP (!) 158/104   Pulse 78   Ht 5\' 5"  (1.651 m)   LMP  (LMP Unknown)   SpO2 99%   BMI 23.93 kg/m  General: Well Developed, well nourished, and in no acute distress.   MSK: Right knee large effusion.  No erythema.  Decreased range of motion 5-100 degrees.  Antalgic gait.    Lab and Radiology Results  Procedure: Real-time Ultrasound Guided aspiration and injection right knee.  Superior lateral patellar space Device: Philips Affiniti 50G Images permanently stored and available for review in PACS Verbal informed consent obtained.  Discussed risks and benefits of procedure. Warned about infection bleeding damage to structures skin hypopigmentation and fat atrophy among others. Patient expresses understanding and agreement Time-out conducted.   Noted no overlying erythema, induration, or other signs of local infection.   Skin prepped in a sterile fashion.   Local anesthesia: Topical Ethyl chloride.   With sterile technique and under real time ultrasound guidance: 3 mL of lidocaine injected subcutaneously and into knee joint. Skin was again sterilized with isopropyl alcohol. 18-gauge needle was used to access the joint  effusion and and 60 mL of yellow foamy slightly cloudy blood-tinged fluid aspirated. Syringe exchanged and 40 mg of Kenalog and 2 mL of lidocaine injected into the knee joint Joint effusion seen to be decompressed after aspiration. Completed without difficulty   Pain immediately resolved suggesting accurate placement of the medication.   Advised to call if fevers/chills, erythema, induration, drainage, or persistent bleeding.   Images permanently stored and available for review in the ultrasound unit.  Impression: Technically successful ultrasound guided injection.   EXAM: RIGHT KNEE 3 VIEWS   COMPARISON:  None.   FINDINGS: Mild diffuse joint space narrowing. Mild tricompartmental peripheral spurring. No acute or healing fracture. Patella normally situated in the trochlear groove. Minimal spurring of the tibial spines. There is faint medial and lateral chondrocalcinosis. Small quadriceps tendon enthesophyte. Mild subchondral cystic change in the patella. No significant joint effusion.   IMPRESSION: Mild tricompartmental osteoarthritis with chondrocalcinosis.     Electronically Signed   By: M.D.   On: 04/06/2021 22:24 . I, 04/08/2021, personally (independently) visualized and performed the interpretation of the images attached in this note.     Chemistry      Component Value Date/Time   NA 135 08/23/2020 1426   NA 136 05/13/2019 1411   K 4.2 08/23/2020 1426   CL 101 08/23/2020 1426   CO2 24 08/23/2020 1426   BUN 33 (H) 08/23/2020 1426   BUN 32 (H) 05/13/2019 1411   CREATININE 1.61 (H) 08/23/2020 1426      Component Value Date/Time   CALCIUM  9.7 08/23/2020 1426   ALKPHOS 49 02/20/2020 1421   AST 17 08/23/2020 1426   ALT 9 08/23/2020 1426   BILITOT 0.3 08/23/2020 1426        Assessment and Plan: 87 y.o. female with right knee pain and effusion.  Etiology is unclear.  Patient has not responded to conservative management strategies of steroid  injection and hyaluronic acid injection.  She continues to have pain and effusion worsening after the hyaluronic acid injection.  Pseudogout is certainly a possibility based on x-ray appearance with chondrocalcinosis.  Joint infection is a possibility.  Plan for joint aspiration for culture, cell count and differential and crystal analysis.  This should help evaluate for joint infection, as well as for pseudogout.  Refill tramadol.  We will treat based on results of test.   PDMP not reviewed this encounter. Orders Placed This Encounter  Procedures   Anaerobic and Aerobic Culture    Source rt knee   [place] LIMITED JOINT SPACE STRUCTURES LOW RIGHT(NO LINKED CHARGES)    Standing Status:   Future    Number of Occurrences:   1    Standing Expiration Date:   02/27/2022    Order Specific Question:   Reason for Exam (SYMPTOM  OR DIAGNOSIS REQUIRED)    Answer:   right knee pain    Order Specific Question:   Preferred imaging location?    Answer:   [facility]   Synovial cell count + diff, w/ crystals    Rt knee   Meds ordered this encounter  Medications   traMADol (ULTRAM) 50 MG tablet    Sig: Take 1 tablet (50 mg total) by mouth every 12 (twelve) hours as needed for severe pain.    Dispense:  15 tablet    Refill:  0     Discussed warning signs or symptoms. Please see discharge instructions. Patient expresses understanding.   The above documentation has been reviewed and is accurate and complete [name] [name], M.D.

## 2021-08-29 NOTE — Patient Instructions (Signed)
Thank you for coming in today.   You received an injection today. Seek immediate medical attention if the joint becomes red, extremely painful, or is oozing fluid.   Recheck back in 2 weeks.  

## 2021-09-01 NOTE — Progress Notes (Signed)
Joint fluid analysis shows no crystals making pseudogout unlikely.  No bacteria are growing in the culture which is great news.  The culture has a few more days before it finalizes negative.

## 2021-09-04 LAB — ANAEROBIC AND AEROBIC CULTURE
AER RESULT:: NO GROWTH
GRAM STAIN:: NONE SEEN
MICRO NUMBER:: 12511124
MICRO NUMBER:: 12511125
SPECIMEN QUALITY:: ADEQUATE
SPECIMEN QUALITY:: ADEQUATE

## 2021-09-04 LAB — SYNOVIAL FLUID ANALYSIS, COMPLETE
Basophils, %: 0 %
Eosinophils-Synovial: 0 % (ref 0–2)
Lymphocytes-Synovial Fld: 57 % (ref 0–74)
Monocyte/Macrophage: 22 % (ref 0–69)
Neutrophil, Synovial: 18 % (ref 0–24)
Synoviocytes, %: 2 % (ref 0–15)
WBC, Synovial: 466 cells/uL — ABNORMAL HIGH (ref <150)

## 2021-09-05 ENCOUNTER — Other Ambulatory Visit: Payer: Self-pay

## 2021-09-05 DIAGNOSIS — M1711 Unilateral primary osteoarthritis, right knee: Secondary | ICD-10-CM

## 2021-09-05 NOTE — Progress Notes (Signed)
No bacteria grew from the right knee.  No sign of infection.

## 2021-09-07 ENCOUNTER — Other Ambulatory Visit: Payer: Self-pay | Admitting: Cardiovascular Disease

## 2021-09-12 NOTE — Progress Notes (Signed)
I, Christoper Fabian, LAT, ATC, am serving as scribe for Dr. [name].  [name] [name] is a 87 y.o. female who presents to [facility] at [facility] today for f/u R knee pain. Pt was last seen by Dr. Denyse Amass on 08/29/21 and her R knee was aspirated and she was given a steroid injection and her Tramadol was refilled. When providing pt's daughter w/ pt's lab results, on 10/24, she noted interest in pursing PT and referral was placed to the [facility] office. Today, pt reports that her R knee is feeling much better and notes no pain in her R knee today.  She is no longer using her cane for walking.  She has a PT evaluation scheduled for Nov 11th.  Dx imaging: 04/05/21 R knee XR             01/07/21 L knee XR  Pertinent review of systems: No fevers or chills  Relevant historical information: Hypertension and heart disease.  Lives i somewhat independently.   Exam:  BP 120/76 (BP Location: Right Arm, Patient Position: Sitting, Cuff Size: Normal)   Pulse (!) 57   Ht 5\' 5"  (1.651 m)   Wt 138 lb 3.2 oz (62.7 kg)   LMP  (LMP Unknown)   SpO2 99%   BMI 23.00 kg/m  General: Well Developed, well nourished, and in no acute distress.   MSK: Right knee moderate effusion.  Normal motion with crepitation.  Ambulating without the use of a cane now.    Lab and Radiology Results  EXAM: RIGHT KNEE 3 VIEWS   COMPARISON:  None.   FINDINGS: Mild diffuse joint space narrowing. Mild tricompartmental peripheral spurring. No acute or healing fracture. Patella normally situated in the trochlear groove. Minimal spurring of the tibial spines. There is faint medial and lateral chondrocalcinosis. Small quadriceps tendon enthesophyte. Mild subchondral cystic change in the patella. No significant joint effusion.   IMPRESSION: Mild tricompartmental osteoarthritis with chondrocalcinosis.     Electronically Signed   By: M.D.   On: 04/06/2021 22:24 I, 04/08/2021, personally (independently) visualized and performed the interpretation of the images attached in this note.  Component     Latest Ref Rng & Units 08/29/2021 08/29/2021         1:54 PM  1:54 PM  MICRO NUMBER:       08/31/2021  SPECIMEN QUALITY:       Adequate  Source       KNEE, RIGHT  STATUS:       FINAL  Gram Stain       No organisms or white blood cells seen  ANA RESULT:       No anaerobes isolated.  AER RESULT:       No Growth  Site      RIGHT KNEE   Color, Synovial     STRAW/YELLOW ORANGE (A)   Appearance-Synovial     CLEAR/HAZY CLOUDY (A)   WBC, Synovial     <150 cells/uL 466 (H)   Neutrophil, Synovial     0 - 24 % 18   Lymphocytes-Synovial Fld     0 - 74 % 57   Monocyte/Macrophage     0 - 69 % 22   Eosinophils-Synovial     0 - 2 % 0   Basophils, %     0 % 0   Synoviocytes, %     0 - 15 % 2   Crystals, Fluid  NONE SEEN /HPF     Component     Latest Ref Rng & Units 08/29/2021         1:54 PM  MICRO NUMBER:      [id number]  SPECIMEN QUALITY:      Adequate  Source      KNEE, RIGHT  STATUS:      FINAL  Gram Stain        ANA RESULT:        AER RESULT:        Site        Color, Synovial     STRAW/YELLOW   Appearance-Synovial     CLEAR/HAZY   WBC, Synovial     <150 cells/uL   Neutrophil, Synovial     0 - 24 %   Lymphocytes-Synovial Fld     0 - 74 %   Monocyte/Macrophage     0 - 69 %   Eosinophils-Synovial     0 - 2 %   Basophils, %     0 %   Synoviocytes, %     0 - 15 %   Crystals, Fluid     NONE SEEN /HPF        Assessment and Plan: 87 y.o. female with right knee pain.  Pain thought to be due to DJD.  Patient has had trials of steroid injection with mild benefit and ultimately recently had a trial of single dose hyaluronic acid injection which made her knee worse.  Recently she had a aspiration of joint effusion with injection which helped a lot.  Fortunately the joint aspiration was benign appearing showing no sign of  infection and crystal analysis was negative. Currently she is feeling well and has physical therapy scheduled to start in about 10 days.  Plan to proceed with physical therapy to work on quad strengthening.  If not doing well neck step would probably be Zilretta injection.  Ultimately she may require total knee replacement which I think is an option despite her age at 87 years old.  She is in reasonably good health and I think would be okay candidate.  Check back as needed.     Discussed warning signs or symptoms. Please see discharge instructions. Patient expresses understanding.   The above documentation has been reviewed and is accurate and complete [name], M.D.  Total encounter time 20 minutes including face-to-face time with the patient and, reviewing past medical record, and charting on the date of service.   Treatment plan and options going forward

## 2021-09-13 ENCOUNTER — Ambulatory Visit: Payer: Medicare PPO | Admitting: Family Medicine

## 2021-09-13 ENCOUNTER — Other Ambulatory Visit: Payer: Self-pay

## 2021-09-13 ENCOUNTER — Encounter: Payer: Self-pay | Admitting: Family Medicine

## 2021-09-13 VITALS — BP 120/76 | HR 57 | Ht 65.0 in | Wt 138.2 lb

## 2021-09-13 DIAGNOSIS — M1711 Unilateral primary osteoarthritis, right knee: Secondary | ICD-10-CM

## 2021-09-13 DIAGNOSIS — G8929 Other chronic pain: Secondary | ICD-10-CM | POA: Diagnosis not present

## 2021-09-13 DIAGNOSIS — M25561 Pain in right knee: Secondary | ICD-10-CM

## 2021-09-13 NOTE — Patient Instructions (Addendum)
Nice to see you today.  I'm very glad that you're feeling so much better.  Con't w/ PT and they will advise you regarding discharge.  Follow-up: as needed.   Next step is Zilretta injection as needed.

## 2021-09-23 ENCOUNTER — Other Ambulatory Visit: Payer: Self-pay

## 2021-09-23 ENCOUNTER — Encounter: Payer: Self-pay | Admitting: Physical Therapy

## 2021-09-23 ENCOUNTER — Ambulatory Visit (INDEPENDENT_AMBULATORY_CARE_PROVIDER_SITE_OTHER): Payer: Medicare PPO | Admitting: Physical Therapy

## 2021-09-23 DIAGNOSIS — M6281 Muscle weakness (generalized): Secondary | ICD-10-CM

## 2021-09-23 DIAGNOSIS — G8929 Other chronic pain: Secondary | ICD-10-CM

## 2021-09-23 DIAGNOSIS — M25561 Pain in right knee: Secondary | ICD-10-CM | POA: Diagnosis not present

## 2021-09-23 NOTE — Therapy (Signed)
[facility] [address] [place], [place], [address] Phone: [phone]   Fax:  [phone]  Physical Therapy Evaluation  Patient Details  Name: [name] [name] MRN: [id number] Date of Birth: 1927/01/10 Referring Provider (PT): [name]   Encounter Date: 09/23/2021   PT End of Session - 09/23/21 1401     Visit Number 1    Number of Visits 12    Authorization Type [employer]    PT Start Time 1302    PT Stop Time 1345    PT Time Calculation (min) 43 min    Activity Tolerance Patient tolerated treatment well    Behavior During Therapy [facility] for tasks assessed/performed             Past Medical History:  Diagnosis Date   Anemia    Anxiety and depression 07/16/2009   ASD (atrial septal defect) per bubble study, 2004    Cardiomyopathy, normal cath 2007, normal EF per ECHO 2014    Hx of colonic polyps    Hypertension    Osteoarthritis    Osteopenia    Podagra 04/29/2014   RLS (restless legs syndrome) 07/10/2009    Past Surgical History:  Procedure Laterality Date   BACK SURGERY  2000   BREAST SURGERY     NEGATIVE BIOPSY   TONSILLECTOMY      There were no vitals filed for this visit.    Subjective Assessment - 09/23/21 1303     Subjective Pt states increased pain in R knee, a few weeks ago. She saw MD, no infection. Did have quite a bit of swelling, still has some, but less now. She states soreness, difficulty getting knee straight. She has a cane, but has not been using it. She lives with daughter, does have stairs, bedroom upstairs.    Pertinent History OA, osteopenia,    Limitations Standing;Walking;House hold activities    Patient Stated Goals decreased pain.    Currently in Pain? Yes    Pain Score 5     Pain Location Knee    Pain Orientation Right    Pain Descriptors / Indicators Aching    Pain Type Chronic pain    Pain Onset More than a month ago    Pain Frequency Intermittent    Aggravating Factors   increased activity , standing, walking,                [facility] PT Assessment - 09/23/21 0001       Assessment   Medical Diagnosis R knee pain    Referring Provider (PT) [name]    Prior Therapy Yes      Balance Screen   Has the patient fallen in the past 6 months No      Prior Function   Level of Independence Independent      Cognition   Overall Cognitive Status Within Functional Limits for tasks assessed      ROM / Strength   AROM / PROM / Strength AROM;Strength      AROM   AROM Assessment Site Knee    Right/Left Knee Right;Left    Right Knee Extension -25    Right Knee Flexion 115    Left Knee Extension -8    Left Knee Flexion 115      Strength   Overall Strength Comments R knee: 4/5, L knee: 4+/5,      Palpation   Palpation comment Moderate swelling in R knee, hypomobile knee bil, R>L. Poor ability for  quad activation on R.      Transfers   Comments Need for use of hands, due to pain and weakness in R knee.      Ambulation/Gait   Gait Comments Increased knee flexion with stance and gait.                        Objective measurements completed on examination: See above findings.       [facility] Adult PT Treatment/Exercise - 09/23/21 0001       Exercises   Exercises Knee/Hip      Knee/Hip Exercises: Stretches   Active Hamstring Stretch 3 reps;30 seconds    Active Hamstring Stretch Limitations seated      Knee/Hip Exercises: Aerobic   Recumbent Bike L1 x 5 min;      Knee/Hip Exercises: Seated   Long Arc Quad 10 reps;Both      Knee/Hip Exercises: Supine   Quad Sets 15 reps;Right    Quad Sets Limitations supine and long sitting (improved extension in sitting)    Heel Slides 10 reps;Both    Straight Leg Raises 10 reps;Both      Manual Therapy   Manual Therapy Joint mobilization;Passive ROM    Passive ROM gentile ROM for increasing knee flexion and extension                     PT Education - 09/23/21 1400      Education Details PT POC, HEP, Exam findings, HEP [email or web address]    Person(s) Educated Patient    Methods Explanation;Demonstration;Tactile cues;Verbal cues;Handout    Comprehension Verbalized understanding;Returned demonstration;Verbal cues required;Tactile cues required;Need further instruction              PT Short Term Goals - 09/23/21 1405       PT SHORT TERM GOAL #1   Title Pt will become independent with iniital  HEP    Time 2    Period Weeks    Status New    Target Date 10/07/21               PT Long Term Goals - 09/23/21 1406       PT LONG TERM GOAL #1   Title Pt will become independent with final HEP    Time 8    Period Weeks    Status New    Target Date 11/18/21      PT LONG TERM GOAL #2   Title Pt to demo improved R knee ROM by at least 15 degrees for Extension.    Time 8    Period Weeks    Status New    Target Date 11/18/21      PT LONG TERM GOAL #3   Title Pt to demo improved strength of R hip and knee to at least 4+/5 to improve gait and stairs.    Time 8    Period Weeks    Status New    Target Date 11/18/21      PT LONG TERM GOAL #4   Title Pt to demo ability for recipricol pattern on stairs, with 1 UE support, to improve ability for home navigation.    Time 8    Period Weeks    Status New    Target Date 11/18/21                    Plan - 09/23/21 1408     Clinical Impression Statement Pt presents  with primary complaint of pain in R knee. Pt with more chronic type pain now, was doing well, but has had flare up of pain and swelling. She has moderate swelling in R knee, and increased pain with activity. She has decreased ROM, with signficant limiation for Extension in supine and standing. She also has poor gait mechanics, with increased knee flexion. She has decreased strength of quad and poor ability for Quad contraction today. Pt with decreased ability for full functional activiteis, transfers, stairs, walking, and community activity,  due to pain and dysfunction. Pt to benefit from skilled PT to improve.    Personal Factors and Comorbidities Comorbidity 1    Comorbidities OA    Examination-Activity Limitations Stand;Locomotion Level;Transfers;Squat;Stairs    Examination-Participation Restrictions Church;Cleaning;Meal Prep;Community Activity;Shop    Stability/Clinical Decision Making Stable/Uncomplicated    Clinical Decision Making Low    Rehab Potential Good    PT Frequency 2x / week    PT Duration 8 weeks    PT Treatment/Interventions ADLs/Self Care Home Management;Cryotherapy;Iontophoresis 4mg /ml Dexamethasone;Ultrasound;DME Instruction;Balance training;Therapeutic exercise;Therapeutic activities;Functional mobility training;Stair training;Gait training;Neuromuscular re-education;Patient/family education;Manual techniques;Passive range of motion;Dry needling;Taping;Energy conservation;Vasopneumatic Device    PT Home Exercise Plan [email or web address]    Consulted and Agree with Plan of Care Patient             Patient will benefit from skilled therapeutic intervention in order to improve the following deficits and impairments:  Abnormal gait, Pain, Decreased mobility, Increased muscle spasms, Hypomobility, Decreased strength, Decreased range of motion, Decreased activity tolerance, Decreased balance, Increased edema, Impaired flexibility  Visit Diagnosis: Chronic pain of right knee  Muscle weakness (generalized)     Problem List Patient Active Problem List   Diagnosis Date Noted   Depression, recurrent (HCC) 02/20/2020   Prediabetes 01/26/2019   History of exercise intolerance 01/26/2019   HLD (hyperlipidemia) 04/07/2018   DJD (degenerative joint disease) 02/09/2010   PVCs (premature ventricular contractions) 07/20/2009   Anxiety and depression 07/16/2009   Cardiomyopathy (HCC) 04/23/2007   HTN (hypertension) 04/15/2007   Osteopenia 04/15/2007    06/15/2007, PT, DPT 2:17 PM  09/23/21    [facility] [address] [place], [place], [place] Phone: [phone]   Fax:  [phone]  Name: [name] [name] MRN: Donnetta Hutching Date of Birth: 10/06/1927

## 2021-09-23 NOTE — Patient Instructions (Signed)
Access Code: [email or web address] URL: [email or web address] Date: 09/23/2021 Prepared by: Sedalia Muta  Exercises  Seated Hamstring Stretch - 2 x daily - 3 reps - 30 hold Supine Heel Slide - 1 x daily - 1-2 sets - 10 reps Supine Quad Set - 1-2 x daily - 1 sets - 10 reps Long Sitting Quad Set - 1 x daily - 2 sets - 10 reps Straight Leg Raise - 1 x daily - 1-2 sets - 10 reps Seated Long Arc Quad - 2 x daily - 7 x weekly - 2 sets - 10 reps - 5 hold

## 2021-09-30 ENCOUNTER — Other Ambulatory Visit: Payer: Self-pay | Admitting: Cardiovascular Disease

## 2021-10-04 ENCOUNTER — Other Ambulatory Visit: Payer: Self-pay

## 2021-10-04 ENCOUNTER — Ambulatory Visit: Payer: Medicare PPO | Admitting: Physical Therapy

## 2021-10-04 ENCOUNTER — Encounter: Payer: Self-pay | Admitting: Physical Therapy

## 2021-10-04 DIAGNOSIS — G8929 Other chronic pain: Secondary | ICD-10-CM | POA: Diagnosis not present

## 2021-10-04 DIAGNOSIS — M6281 Muscle weakness (generalized): Secondary | ICD-10-CM | POA: Diagnosis not present

## 2021-10-04 DIAGNOSIS — M25561 Pain in right knee: Secondary | ICD-10-CM

## 2021-10-05 NOTE — Therapy (Signed)
[facility] [address] [place], [place], [address] Phone: [phone]   Fax:  [phone]  Physical Therapy Treatment  Patient Details  Name: [name] [name] MRN: [id number] Date of Birth: 09/24/1927 Referring Provider (PT): [name]   Encounter Date: 10/04/2021   PT End of Session - 10/04/21 1001     Visit Number 2    Number of Visits 12    Authorization Type [employer]    PT Start Time 0932    PT Stop Time 1014    PT Time Calculation (min) 42 min    Activity Tolerance Patient tolerated treatment well    Behavior During Therapy [facility] for tasks assessed/performed             Past Medical History:  Diagnosis Date   Anemia    Anxiety and depression 07/16/2009   ASD (atrial septal defect) per bubble study, 2004    Cardiomyopathy, normal cath 2007, normal EF per ECHO 2014    Hx of colonic polyps    Hypertension    Osteoarthritis    Osteopenia    Podagra 04/29/2014   RLS (restless legs syndrome) 07/10/2009    Past Surgical History:  Procedure Laterality Date   BACK SURGERY  2000   BREAST SURGERY     NEGATIVE BIOPSY   TONSILLECTOMY      There were no vitals filed for this visit.   Subjective Assessment - 10/04/21 1001     Subjective Pt states knee a little less paiful, but is swollen. Not suing cane today.    Currently in Pain? Yes    Pain Score 5     Pain Location Knee    Pain Orientation Right    Pain Descriptors / Indicators Aching    Pain Type Chronic pain    Pain Onset More than a month ago    Pain Frequency Intermittent                               [facility] Adult PT Treatment/Exercise - 10/05/21 0001       Ambulation/Gait   Gait Comments 35 ft x8 with use of SPC, education review for sequencing with cane.      Exercises   Exercises Knee/Hip      Knee/Hip Exercises: Stretches   Active Hamstring Stretch 3 reps;30 seconds    Active Hamstring Stretch Limitations seated       Knee/Hip Exercises: Aerobic   Recumbent Bike L1 x 8 min;      Knee/Hip Exercises: Seated   Long Arc Quad Both;20 reps      Knee/Hip Exercises: Supine   Quad Sets Right;20 reps    Heel Slides 20 reps;Right    Straight Leg Raises Both;20 reps      Manual Therapy   Manual Therapy Joint mobilization;Passive ROM    Passive ROM gentile ROM for increasing knee flexion and extension                       PT Short Term Goals - 09/23/21 1405       PT SHORT TERM GOAL #1   Title Pt will become independent with iniital  HEP    Time 2    Period Weeks    Status New    Target Date 10/07/21               PT Long Term Goals - 09/23/21  1406       PT LONG TERM GOAL #1   Title Pt will become independent with final HEP    Time 8    Period Weeks    Status New    Target Date 11/18/21      PT LONG TERM GOAL #2   Title Pt to demo improved R knee ROM by at least 15 degrees for Extension.    Time 8    Period Weeks    Status New    Target Date 11/18/21      PT LONG TERM GOAL #3   Title Pt to demo improved strength of R hip and knee to at least 4+/5 to improve gait and stairs.    Time 8    Period Weeks    Status New    Target Date 11/18/21      PT LONG TERM GOAL #4   Title Pt to demo ability for recipricol pattern on stairs, with 1 UE support, to improve ability for home navigation.    Time 8    Period Weeks    Status New    Target Date 11/18/21                   Plan - 10/04/21 1002     Clinical Impression Statement Pt with continued pain and swelling in R knee. Able to perform exercises today, but challenged with most, due to weakness and stiffness. Much difficulty getting knee straight, but does have improved ROM today from last visit. Recommended use of SPC for improved gait pattern and pressure on knee, as well as compression sleeve for knee for swelling.    Personal Factors and Comorbidities Comorbidity 1    Comorbidities OA     Examination-Activity Limitations Stand;Locomotion Level;Transfers;Squat;Stairs    Examination-Participation Restrictions Church;Cleaning;Meal Prep;Community Activity;Shop    Stability/Clinical Decision Making Stable/Uncomplicated    Rehab Potential Good    PT Frequency 2x / week    PT Duration 8 weeks    PT Treatment/Interventions ADLs/Self Care Home Management;Cryotherapy;Iontophoresis 4mg /ml Dexamethasone;Ultrasound;DME Instruction;Balance training;Therapeutic exercise;Therapeutic activities;Functional mobility training;Stair training;Gait training;Neuromuscular re-education;Patient/family education;Manual techniques;Passive range of motion;Dry needling;Taping;Energy conservation;Vasopneumatic Device    PT Home Exercise Plan [email or web address]    Consulted and Agree with Plan of Care Patient             Patient will benefit from skilled therapeutic intervention in order to improve the following deficits and impairments:  Abnormal gait, Pain, Decreased mobility, Increased muscle spasms, Hypomobility, Decreased strength, Decreased range of motion, Decreased activity tolerance, Decreased balance, Increased edema, Impaired flexibility  Visit Diagnosis: Chronic pain of right knee  Muscle weakness (generalized)     Problem List Patient Active Problem List   Diagnosis Date Noted   Depression, recurrent (HCC) 02/20/2020   Prediabetes 01/26/2019   History of exercise intolerance 01/26/2019   HLD (hyperlipidemia) 04/07/2018   DJD (degenerative joint disease) 02/09/2010   PVCs (premature ventricular contractions) 07/20/2009   Anxiety and depression 07/16/2009   Cardiomyopathy (HCC) 04/23/2007   HTN (hypertension) 04/15/2007   Osteopenia 04/15/2007    06/15/2007, PT, DPT 8:19 AM  10/05/21    [facility] [address] [place], [place], [place] Phone: [phone]   Fax:  [phone]  Name: [name] [name] MRN: Donnetta Hutching Date of  Birth: [birth year]

## 2021-10-10 ENCOUNTER — Other Ambulatory Visit: Payer: Self-pay

## 2021-10-10 ENCOUNTER — Ambulatory Visit: Payer: Medicare PPO | Admitting: Family Medicine

## 2021-10-10 ENCOUNTER — Encounter: Payer: Self-pay | Admitting: Family Medicine

## 2021-10-10 VITALS — BP 130/74 | HR 84 | Ht 65.0 in | Wt 141.6 lb

## 2021-10-10 DIAGNOSIS — G8929 Other chronic pain: Secondary | ICD-10-CM

## 2021-10-10 DIAGNOSIS — M25561 Pain in right knee: Secondary | ICD-10-CM

## 2021-10-10 DIAGNOSIS — M1711 Unilateral primary osteoarthritis, right knee: Secondary | ICD-10-CM

## 2021-10-10 NOTE — Patient Instructions (Addendum)
Good to see you today.  We will work on Chemical [employer] authorized and will contact you once we hear back from [employer].  We will also be in contact w/ [place] Citizen, our brace representative, and she will contact you to schedule an appt.  Con't w/ PT.  Follow-up: for Zilretta injection once we hear back from your insurance

## 2021-10-10 NOTE — Progress Notes (Signed)
   I, Christoper Fabian, LAT, ATC, am serving as scribe for Dr. [name].  [name] Judie Petit [place] is a 87 y.o. female who presents to [facility] today for continued chronic R knee pain. Pt was last seen by Dr. Denyse Amass on 09/13/21 and was advised to proceed to PT, completing 2 visits. Today, pt reports PT seems to be making R knee pain worse.  She was previously seen on 08/19/21 and had a R knee aspiration/injection.  Today, pt reports that her R knee is still swollen.  She has been using ice and doing her HEP per PT.  She and her daughter are curious as to whether or not she should con't w/ PT as she has another visit scheduled tomorrow.  Dx imaging: 04/05/21 R knee XR  Pertinent review of systems: No fevers or chills  Relevant historical information: Hypertension.  Cardiomyopathy.   Exam:  BP 130/74 (BP Location: Right Arm, Patient Position: Sitting, Cuff Size: Normal)   Pulse 84   Ht 5\' 5"  (1.651 m)   Wt 141 lb 9.6 oz (64.2 kg)   LMP  (LMP Unknown)   SpO2 97%   BMI 23.56 kg/m  General: Well Developed, well nourished, and in no acute distress.   MSK: Right knee moderate effusion normal motion with crepitation.  Laxity to MCL stress test.  Intact strength.    Lab and Radiology Results EXAM: RIGHT KNEE 3 VIEWS   COMPARISON:  None.   FINDINGS: Mild diffuse joint space narrowing. Mild tricompartmental peripheral spurring. No acute or healing fracture. Patella normally situated in the trochlear groove. Minimal spurring of the tibial spines. There is faint medial and lateral chondrocalcinosis. Small quadriceps tendon enthesophyte. Mild subchondral cystic change in the patella. No significant joint effusion.   IMPRESSION: Mild tricompartmental osteoarthritis with chondrocalcinosis.     Electronically Signed   By: M.D.   On: 04/06/2021 22:24   I, 04/08/2021, personally (independently) visualized and performed the interpretation of  the images attached in this note.     Assessment and Plan: 87 y.o. female with right knee pain due to DJD exacerbation.  Patient is failing conservative management.  She is already had trial of hyaluronic acid injection which did not help.  She has had recent steroid injection which seems like it is wearing off now.  We will go ahead and authorize Zilretta.  I will contact [employer] representative and see if a medial off loader knee brace helps.  She has laxity and DJD and may have benefit from a medial off loader knee brace. If all this should fail next step may be referral to PM&R for geniculate nerve ablation.     Discussed warning signs or symptoms. Please see discharge instructions. Patient expresses understanding.   The above documentation has been reviewed and is accurate and complete 99, M.D.

## 2021-10-11 ENCOUNTER — Telehealth: Payer: Self-pay | Admitting: Family Medicine

## 2021-10-11 ENCOUNTER — Encounter: Payer: Self-pay | Admitting: Physical Therapy

## 2021-10-11 ENCOUNTER — Telehealth: Payer: Self-pay

## 2021-10-11 ENCOUNTER — Ambulatory Visit: Payer: Medicare PPO | Admitting: Physical Therapy

## 2021-10-11 DIAGNOSIS — G8929 Other chronic pain: Secondary | ICD-10-CM | POA: Diagnosis not present

## 2021-10-11 DIAGNOSIS — M6281 Muscle weakness (generalized): Secondary | ICD-10-CM

## 2021-10-11 DIAGNOSIS — M25561 Pain in right knee: Secondary | ICD-10-CM

## 2021-10-11 NOTE — Therapy (Signed)
[facility] [address] [place], [place], [address] Phone: [phone]   Fax:  [phone]  Physical Therapy Treatment  Patient Details  Name: [name] [name] MRN: [id number] Date of Birth: 1927-10-01 Referring Provider (PT): [name]   Encounter Date: 10/11/2021   PT End of Session - 10/11/21 1351     Visit Number 3    Number of Visits 12    Authorization Type [employer]    PT Start Time 1300    PT Stop Time 1344    PT Time Calculation (min) 44 min    Activity Tolerance Patient tolerated treatment well    Behavior During Therapy [facility] for tasks assessed/performed             Past Medical History:  Diagnosis Date   Anemia    Anxiety and depression 07/16/2009   ASD (atrial septal defect) per bubble study, 2004    Cardiomyopathy, normal cath 2007, normal EF per ECHO 2014    Hx of colonic polyps    Hypertension    Osteoarthritis    Osteopenia    Podagra 04/29/2014   RLS (restless legs syndrome) 07/10/2009    Past Surgical History:  Procedure Laterality Date   BACK SURGERY  2000   BREAST SURGERY     NEGATIVE BIOPSY   TONSILLECTOMY      There were no vitals filed for this visit.   Subjective Assessment - 10/11/21 1350     Subjective Pt states increased pain yesterday, had f/u with MD due to pain. Today, pain is much better.    Currently in Pain? Yes    Pain Score 5     Pain Location Knee    Pain Orientation Right    Pain Descriptors / Indicators Aching    Pain Type Chronic pain    Pain Onset More than a month ago    Pain Frequency Intermittent                [facility] PT Assessment - 10/11/21 0001       AROM   Right Knee Extension -10                           [facility] Adult PT Treatment/Exercise - 10/11/21 0001       Ambulation/Gait   Gait Comments --      Exercises   Exercises Knee/Hip      Knee/Hip Exercises: Stretches   Active Hamstring Stretch 3 reps;30 seconds    Active  Hamstring Stretch Limitations seated      Knee/Hip Exercises: Aerobic   Recumbent Bike L1 x 6 min;      Knee/Hip Exercises: Seated   Long Arc Quad Both;20 reps      Knee/Hip Exercises: Supine   Quad Sets Right;20 reps    Heel Slides 20 reps;Right    Straight Leg Raises Both;20 reps      Manual Therapy   Manual Therapy Joint mobilization;Passive ROM    Joint Mobilization to increase knee extension    Passive ROM gentile ROM for increasing knee flexion and extension                     PT Education - 10/11/21 1351     Education Details updated HEP, discussed HEP with daughter    Person(s) Educated Patient;Child(ren)    Methods Explanation;Demonstration;Tactile cues;Verbal cues;Handout    Comprehension Verbalized understanding;Returned demonstration;Verbal cues required;Tactile cues required;Need further  instruction              PT Short Term Goals - 09/23/21 1405       PT SHORT TERM GOAL #1   Title Pt will become independent with iniital  HEP    Time 2    Period Weeks    Status New    Target Date 10/07/21               PT Long Term Goals - 09/23/21 1406       PT LONG TERM GOAL #1   Title Pt will become independent with final HEP    Time 8    Period Weeks    Status New    Target Date 11/18/21      PT LONG TERM GOAL #2   Title Pt to demo improved R knee ROM by at least 15 degrees for Extension.    Time 8    Period Weeks    Status New    Target Date 11/18/21      PT LONG TERM GOAL #3   Title Pt to demo improved strength of R hip and knee to at least 4+/5 to improve gait and stairs.    Time 8    Period Weeks    Status New    Target Date 11/18/21      PT LONG TERM GOAL #4   Title Pt to demo ability for recipricol pattern on stairs, with 1 UE support, to improve ability for home navigation.    Time 8    Period Weeks    Status New    Target Date 11/18/21                   Plan - 10/11/21 1352     Clinical Impression  Statement Pts daughter present today, for education on HEP to help pt perform correctly at home. Pt with improving knee extension in supine, improved to -10 today, still with difficulty getting knee straight in standing. Improving ability for ther ex, with less need for cuing today, pt still needs mod verbal cues to perform correctly. Discussed using cane for more support and pressure off knee. Plan to progress as able.    Personal Factors and Comorbidities Comorbidity 1    Comorbidities OA    Examination-Activity Limitations Stand;Locomotion Level;Transfers;Squat;Stairs    Examination-Participation Restrictions Church;Cleaning;Meal Prep;Community Activity;Shop    Stability/Clinical Decision Making Stable/Uncomplicated    Rehab Potential Good    PT Frequency 2x / week    PT Duration 8 weeks    PT Treatment/Interventions ADLs/Self Care Home Management;Cryotherapy;Iontophoresis 4mg /ml Dexamethasone;Ultrasound;DME Instruction;Balance training;Therapeutic exercise;Therapeutic activities;Functional mobility training;Stair training;Gait training;Neuromuscular re-education;Patient/family education;Manual techniques;Passive range of motion;Dry needling;Taping;Energy conservation;Vasopneumatic Device    PT Home Exercise Plan [email or web address]    Consulted and Agree with Plan of Care Patient             Patient will benefit from skilled therapeutic intervention in order to improve the following deficits and impairments:  Abnormal gait, Pain, Decreased mobility, Increased muscle spasms, Hypomobility, Decreased strength, Decreased range of motion, Decreased activity tolerance, Decreased balance, Increased edema, Impaired flexibility  Visit Diagnosis: Chronic pain of right knee  Muscle weakness (generalized)     Problem List Patient Active Problem List   Diagnosis Date Noted   Depression, recurrent (HCC) 02/20/2020   Prediabetes 01/26/2019   History of exercise intolerance 01/26/2019   HLD  (hyperlipidemia) 04/07/2018   DJD (degenerative joint disease) 02/09/2010   PVCs (premature ventricular contractions)  07/20/2009   Anxiety and depression 07/16/2009   Cardiomyopathy (HCC) 04/23/2007   HTN (hypertension) 04/15/2007   Osteopenia 04/15/2007   Sedalia Muta, PT, DPT 1:54 PM  10/11/21    [facility] [address] [place], [place], [address] Phone: [phone]   Fax:  [phone]  Name: [name] [name] MRN: [id number] Date of Birth: 1927/09/18

## 2021-10-11 NOTE — Telephone Encounter (Signed)
Patient's daughter called asking if Dr Denyse Amass would prescribe Diclofenac Pills instead of the cream? She said that it is hard to put it on 4 times a day and thinks the pill would be easier.  (The daughter has used the pill form in the past)  Please advise.

## 2021-10-11 NOTE — Telephone Encounter (Signed)
Left pt a VM informing her to let us know when she's ready and wanting to do the Zilretta injection. Asked her to let us know and we could proceed getting the medication ordered.

## 2021-10-12 NOTE — Telephone Encounter (Signed)
Diclofenac pills are not safe to use with chronic kidney disease.  When last checked [name] did have some medium chronic kidney disease so long-term use of diclofenac pills are not safe.  The advantage of the cream is that it is a lot safer to use.  It is just less convenient.

## 2021-10-13 ENCOUNTER — Other Ambulatory Visit: Payer: Self-pay

## 2021-10-13 ENCOUNTER — Ambulatory Visit: Payer: Medicare PPO | Admitting: Physical Therapy

## 2021-10-13 DIAGNOSIS — M25561 Pain in right knee: Secondary | ICD-10-CM

## 2021-10-13 DIAGNOSIS — M6281 Muscle weakness (generalized): Secondary | ICD-10-CM | POA: Diagnosis not present

## 2021-10-13 DIAGNOSIS — G8929 Other chronic pain: Secondary | ICD-10-CM | POA: Diagnosis not present

## 2021-10-13 NOTE — Telephone Encounter (Signed)
Patient's daughter called back. I gave her Dr Zollie Pee response.  She said that the patient did take one of the Diclofenac pills that her daughter had. This helped tremendously. She said that the pain was decreased and she was able to sleep through the night. She asked if this could be used for a short time while waiting for the next injection or if their are any other options.The Diclofenac cream does not seem to help her pain.  Please advise.

## 2021-10-14 ENCOUNTER — Encounter: Payer: Self-pay | Admitting: Physical Therapy

## 2021-10-14 NOTE — Telephone Encounter (Signed)
I called Denene's daughter back. I do not think Diclofenac is safe. We discussed ibuprofen safety. No NSAIDs are very safe with CKD3. But Ibuprofen is available OTC and I would recommend using small doses if she chooses to take it.

## 2021-10-14 NOTE — Therapy (Signed)
[facility] [address] [place], [place], [address] Phone: [phone]   Fax:  [phone]  Physical Therapy Treatment  Patient Details  Name: [name] [name] MRN: [id number] Date of Birth: September 13, 1927 Referring Provider (PT): [name]   Encounter Date: 10/13/2021   PT End of Session - 10/14/21 1136     Visit Number 4    Number of Visits 12    Authorization Type [employer]    PT Start Time 1302    PT Stop Time 1345    PT Time Calculation (min) 43 min    Activity Tolerance Patient tolerated treatment well    Behavior During Therapy [facility] for tasks assessed/performed             Past Medical History:  Diagnosis Date   Anemia    Anxiety and depression 07/16/2009   ASD (atrial septal defect) per bubble study, 2004    Cardiomyopathy, normal cath 2007, normal EF per ECHO 2014    Hx of colonic polyps    Hypertension    Osteoarthritis    Osteopenia    Podagra 04/29/2014   RLS (restless legs syndrome) 07/10/2009    Past Surgical History:  Procedure Laterality Date   BACK SURGERY  2000   BREAST SURGERY     NEGATIVE BIOPSY   TONSILLECTOMY      There were no vitals filed for this visit.   Subjective Assessment - 10/14/21 1136     Subjective Pt states improved pain in last couple days. Has been doing HEP.    Currently in Pain? Yes    Pain Score 4     Pain Location Knee    Pain Orientation Right    Pain Descriptors / Indicators Aching    Pain Type Chronic pain    Pain Onset More than a month ago    Pain Frequency Intermittent                               [facility] Adult PT Treatment/Exercise - 10/14/21 0001       Exercises   Exercises Knee/Hip      Knee/Hip Exercises: Stretches   Active Hamstring Stretch 3 reps;30 seconds    Active Hamstring Stretch Limitations seated      Knee/Hip Exercises: Aerobic   Recumbent Bike L1 x 8 min;      Knee/Hip Exercises: Standing   Hip Flexion 15  reps;Both;Knee bent    Stairs up/down 5 steps, 2 hand rails, recipricol on way up, step-to on way down. x 4 with education on sequencing    Other Standing Knee Exercises Standing quad set x 15 on R;    Other Standing Knee Exercises A/P weight shifts for TKE on R, x 20;      Knee/Hip Exercises: Supine   Quad Sets Right;20 reps    Heel Slides Right;10 reps    Straight Leg Raises Both;20 reps      Manual Therapy   Manual Therapy Joint mobilization;Passive ROM    Joint Mobilization to increase knee extension    Passive ROM gentile ROM for increasing knee flexion and extension                       PT Short Term Goals - 09/23/21 1405       PT SHORT TERM GOAL #1   Title Pt will become independent with iniital  HEP  Time 2    Period Weeks    Status New    Target Date 10/07/21               PT Long Term Goals - 09/23/21 1406       PT LONG TERM GOAL #1   Title Pt will become independent with final HEP    Time 8    Period Weeks    Status New    Target Date 11/18/21      PT LONG TERM GOAL #2   Title Pt to demo improved R knee ROM by at least 15 degrees for Extension.    Time 8    Period Weeks    Status New    Target Date 11/18/21      PT LONG TERM GOAL #3   Title Pt to demo improved strength of R hip and knee to at least 4+/5 to improve gait and stairs.    Time 8    Period Weeks    Status New    Target Date 11/18/21      PT LONG TERM GOAL #4   Title Pt to demo ability for recipricol pattern on stairs, with 1 UE support, to improve ability for home navigation.    Time 8    Period Weeks    Status New    Target Date 11/18/21                   Plan - 10/14/21 1138     Clinical Impression Statement Pt has had variable pain and swelling. Less pain today, and good tolerance for ther ex. She does have significant limitation for knee extension that limits gait mechanics. Strength limitations and crepitus in R knee limit ability for recipricol  stairs. DIscussed doing step-to pattern if knee too painful/weak to do recipricol. Also recommended continued use of SPC for knee pain.    Personal Factors and Comorbidities Comorbidity 1    Comorbidities OA    Examination-Activity Limitations Stand;Locomotion Level;Transfers;Squat;Stairs    Examination-Participation Restrictions Church;Cleaning;Meal Prep;Community Activity;Shop    Stability/Clinical Decision Making Stable/Uncomplicated    Rehab Potential Good    PT Frequency 2x / week    PT Duration 8 weeks    PT Treatment/Interventions ADLs/Self Care Home Management;Cryotherapy;Iontophoresis 4mg /ml Dexamethasone;Ultrasound;DME Instruction;Balance training;Therapeutic exercise;Therapeutic activities;Functional mobility training;Stair training;Gait training;Neuromuscular re-education;Patient/family education;Manual techniques;Passive range of motion;Dry needling;Taping;Energy conservation;Vasopneumatic Device    PT Home Exercise Plan [id number]    Consulted and Agree with Plan of Care Patient             Patient will benefit from skilled therapeutic intervention in order to improve the following deficits and impairments:  Abnormal gait, Pain, Decreased mobility, Increased muscle spasms, Hypomobility, Decreased strength, Decreased range of motion, Decreased activity tolerance, Decreased balance, Increased edema, Impaired flexibility  Visit Diagnosis: Chronic pain of right knee  Muscle weakness (generalized)     Problem List Patient Active Problem List   Diagnosis Date Noted   Depression, recurrent (HCC) 02/20/2020   Prediabetes 01/26/2019   History of exercise intolerance 01/26/2019   HLD (hyperlipidemia) 04/07/2018   DJD (degenerative joint disease) 02/09/2010   PVCs (premature ventricular contractions) 07/20/2009   Anxiety and depression 07/16/2009   Cardiomyopathy (HCC) 04/23/2007   HTN (hypertension) 04/15/2007   Osteopenia 04/15/2007    06/15/2007, PT, DPT 11:40 AM   10/14/21    [facility] [address] [place], [place], [place] Phone: [phone]   Fax:  [phone]  Name: [name] [name] MRN: [id number] Date of Birth: Apr 07, 1927

## 2021-10-17 ENCOUNTER — Telehealth: Payer: Self-pay

## 2021-10-17 NOTE — Telephone Encounter (Signed)
Spoke w/ pt's daughter, Vikki Ports, due to no response from VM. Vikki Ports reported that she would speak to her mom and see how she is feeling and when she would like to get the Zilretta injection and let us know.

## 2021-10-19 NOTE — Telephone Encounter (Signed)
Pt daughter called, would like to start the Zilretta. Scheduled for 12/14/

## 2021-10-21 ENCOUNTER — Ambulatory Visit: Payer: Medicare PPO | Admitting: Physical Therapy

## 2021-10-21 ENCOUNTER — Other Ambulatory Visit: Payer: Self-pay

## 2021-10-21 ENCOUNTER — Encounter: Payer: Self-pay | Admitting: Physical Therapy

## 2021-10-21 DIAGNOSIS — M6281 Muscle weakness (generalized): Secondary | ICD-10-CM | POA: Diagnosis not present

## 2021-10-21 DIAGNOSIS — G8929 Other chronic pain: Secondary | ICD-10-CM

## 2021-10-21 DIAGNOSIS — M25561 Pain in right knee: Secondary | ICD-10-CM

## 2021-10-21 NOTE — Therapy (Signed)
[facility] [address] [place], [place], [address] Phone: [phone]   Fax:  [phone]  Physical Therapy Treatment  Patient Details  Name: [name] [name] MRN: [id number] Date of Birth: Jun 02, 1927 Referring Provider (PT): [name]   Encounter Date: 10/21/2021   PT End of Session - 10/21/21 1211     Visit Number 5    Number of Visits 12    Date for PT Re-Evaluation 11/18/21    Authorization Type [employer]    PT Start Time 1147    PT Stop Time 1230    PT Time Calculation (min) 43 min    Activity Tolerance Patient tolerated treatment well    Behavior During Therapy [facility] for tasks assessed/performed             Past Medical History:  Diagnosis Date   Anemia    Anxiety and depression 07/16/2009   ASD (atrial septal defect) per bubble study, 2004    Cardiomyopathy, normal cath 2007, normal EF per ECHO 2014    Hx of colonic polyps    Hypertension    Osteoarthritis    Osteopenia    Podagra 04/29/2014   RLS (restless legs syndrome) 07/10/2009    Past Surgical History:  Procedure Laterality Date   BACK SURGERY  2000   BREAST SURGERY     NEGATIVE BIOPSY   TONSILLECTOMY      There were no vitals filed for this visit.   Subjective Assessment - 10/21/21 1208     Subjective Pt states decreasing soreness. Has been able to sleep better without pain.    Patient Stated Goals decreased pain.    Currently in Pain? Yes    Pain Score 4     Pain Location Knee    Pain Orientation Right    Pain Descriptors / Indicators Aching    Pain Type Chronic pain    Pain Onset More than a month ago    Pain Frequency Intermittent                               [facility] Adult PT Treatment/Exercise - 10/21/21 0001       Exercises   Exercises Knee/Hip      Knee/Hip Exercises: Stretches   Active Hamstring Stretch 3 reps;30 seconds    Active Hamstring Stretch Limitations seated      Knee/Hip Exercises: Aerobic    Recumbent Bike L1 x 8 min;      Knee/Hip Exercises: Standing   Hip Flexion --    Stairs --    Other Standing Knee Exercises --    Other Standing Knee Exercises Bwd walking 20 ft x 3;  amulation 35 ft x 6 with cuing for increased knee extension      Knee/Hip Exercises: Supine   Quad Sets Right;20 reps    Heel Slides Right;15 reps    Straight Leg Raises Both;20 reps    Other Supine Knee/Hip Exercises Clams Blue TB x 20;      Manual Therapy   Manual Therapy Joint mobilization;Passive ROM    Joint Mobilization to increase knee extension    Passive ROM gentile ROM for increasing knee flexion and extension                     PT Education - 10/21/21 1211     Education Details reviewed HEP , reviewed possible compression sleeve for knee for swelling.  Person(s) Educated Patient    Methods Explanation;Demonstration;Tactile cues;Verbal cues    Comprehension Verbalized understanding;Returned demonstration;Verbal cues required;Tactile cues required;Need further instruction              PT Short Term Goals - 10/21/21 1213       PT SHORT TERM GOAL #1   Title Pt will become independent with iniital  HEP    Time 2    Period Weeks    Status Achieved    Target Date 10/07/21               PT Long Term Goals - 09/23/21 1406       PT LONG TERM GOAL #1   Title Pt will become independent with final HEP    Time 8    Period Weeks    Status New    Target Date 11/18/21      PT LONG TERM GOAL #2   Title Pt to demo improved R knee ROM by at least 15 degrees for Extension.    Time 8    Period Weeks    Status New    Target Date 11/18/21      PT LONG TERM GOAL #3   Title Pt to demo improved strength of R hip and knee to at least 4+/5 to improve gait and stairs.    Time 8    Period Weeks    Status New    Target Date 11/18/21      PT LONG TERM GOAL #4   Title Pt to demo ability for recipricol pattern on stairs, with 1 UE support, to improve ability for home  navigation.    Time 8    Period Weeks    Status New    Target Date 11/18/21                   Plan - 10/21/21 1407     Clinical Impression Statement Pt with improving pain levels with activity. She is sleeping without pain, and is doing well with her HEP. She continues to have weakness in Quad, and decreased ROM for knee that limits functional activity, walking and stairs. She has limited change in knee position with gait, with decreased ability for knee extension. Pt to benefit from continued care.    Personal Factors and Comorbidities Comorbidity 1    Comorbidities OA    Examination-Activity Limitations Stand;Locomotion Level;Transfers;Squat;Stairs    Examination-Participation Restrictions Church;Cleaning;Meal Prep;Community Activity;Shop    Stability/Clinical Decision Making Stable/Uncomplicated    Rehab Potential Good    PT Frequency 2x / week    PT Duration 8 weeks    PT Treatment/Interventions ADLs/Self Care Home Management;Cryotherapy;Iontophoresis 4mg /ml Dexamethasone;Ultrasound;DME Instruction;Balance training;Therapeutic exercise;Therapeutic activities;Functional mobility training;Stair training;Gait training;Neuromuscular re-education;Patient/family education;Manual techniques;Passive range of motion;Dry needling;Taping;Energy conservation;Vasopneumatic Device    PT Home Exercise Plan [id number]    Consulted and Agree with Plan of Care Patient             Patient will benefit from skilled therapeutic intervention in order to improve the following deficits and impairments:  Abnormal gait, Pain, Decreased mobility, Increased muscle spasms, Hypomobility, Decreased strength, Decreased range of motion, Decreased activity tolerance, Decreased balance, Increased edema, Impaired flexibility  Visit Diagnosis: Chronic pain of right knee  Muscle weakness (generalized)     Problem List Patient Active Problem List   Diagnosis Date Noted   Depression, recurrent (HCC)  02/20/2020   Prediabetes 01/26/2019   History of exercise intolerance 01/26/2019   HLD (hyperlipidemia) 04/07/2018  DJD (degenerative joint disease) 02/09/2010   PVCs (premature ventricular contractions) 07/20/2009   Anxiety and depression 07/16/2009   Cardiomyopathy (HCC) 04/23/2007   HTN (hypertension) 04/15/2007   Osteopenia 04/15/2007   Sedalia Muta, PT, DPT 2:10 PM  10/21/21    [facility] [address] [place], [place], [address] Phone: [phone]   Fax:  [phone]  Name: [name] [name] MRN: [id number] Date of Birth: Apr 23, 1927

## 2021-10-25 ENCOUNTER — Encounter: Payer: Medicare PPO | Admitting: Physical Therapy

## 2021-10-26 ENCOUNTER — Ambulatory Visit: Payer: Self-pay

## 2021-10-26 ENCOUNTER — Ambulatory Visit (INDEPENDENT_AMBULATORY_CARE_PROVIDER_SITE_OTHER): Payer: Medicare PPO | Admitting: Family Medicine

## 2021-10-26 ENCOUNTER — Other Ambulatory Visit: Payer: Self-pay

## 2021-10-26 DIAGNOSIS — M1711 Unilateral primary osteoarthritis, right knee: Secondary | ICD-10-CM

## 2021-10-26 DIAGNOSIS — M25561 Pain in right knee: Secondary | ICD-10-CM | POA: Diagnosis not present

## 2021-10-26 DIAGNOSIS — G8929 Other chronic pain: Secondary | ICD-10-CM

## 2021-10-26 NOTE — Patient Instructions (Signed)
Thank you for coming in today.   You received a Zilretta injection today. Seek immediate medical attention if the joint becomes red, extremely painful, or is oozing fluid.   Hopefully this will give you some lasting relief.  Recheck back as needed

## 2021-10-26 NOTE — Progress Notes (Signed)
[name] presents to clinic today for Zilretta injection right knee  Procedure: Real-time Ultrasound Guided Injection of right knee superior lateral patellar space Device: Philips Affiniti 50G Images permanently stored and available for review in PACS Verbal informed consent obtained.  Discussed risks and benefits of procedure. Warned about infection bleeding damage to structures skin hypopigmentation and fat atrophy among others. Patient expresses understanding and agreement Time-out conducted.   Noted no overlying erythema, induration, or other signs of local infection.   Skin prepped in a sterile fashion.   Local anesthesia: Topical Ethyl chloride.   With sterile technique and under real time ultrasound guidance: Zilretta 32 mg injected into knee joint. Fluid seen entering the joint capsule.   Completed without difficulty   Advised to call if fevers/chills, erythema, induration, drainage, or persistent bleeding.   Images permanently stored and available for review in the ultrasound unit.  Impression: Technically successful ultrasound guided injection.    Lot number: [id number]  Return as needed.  If not improved consider referral to PM&R for geniculate nerve ablation evaluation.

## 2021-11-01 ENCOUNTER — Ambulatory Visit (INDEPENDENT_AMBULATORY_CARE_PROVIDER_SITE_OTHER): Payer: Medicare PPO | Admitting: Physical Therapy

## 2021-11-01 ENCOUNTER — Encounter: Payer: Self-pay | Admitting: Physical Therapy

## 2021-11-01 DIAGNOSIS — M25561 Pain in right knee: Secondary | ICD-10-CM

## 2021-11-01 DIAGNOSIS — G8929 Other chronic pain: Secondary | ICD-10-CM

## 2021-11-01 DIAGNOSIS — M6281 Muscle weakness (generalized): Secondary | ICD-10-CM

## 2021-11-01 NOTE — Therapy (Signed)
[facility] [address] [place], [place], [address] Phone: [phone]   Fax:  [phone]  Physical Therapy Treatment  Patient Details  Name: [name] [name] MRN: [id number] Date of Birth: 21-Sep-1927 Referring Provider (PT): [name]   Encounter Date: 11/01/2021   PT End of Session - 11/01/21 2115     Visit Number 6    Number of Visits 12    Date for PT Re-Evaluation 11/18/21    Authorization Type [employer]    PT Start Time 1221    PT Stop Time 1300    PT Time Calculation (min) 39 min    Activity Tolerance Patient tolerated treatment well    Behavior During Therapy [facility] for tasks assessed/performed             Past Medical History:  Diagnosis Date   Anemia    Anxiety and depression 07/16/2009   ASD (atrial septal defect) per bubble study, 2004    Cardiomyopathy, normal cath 2007, normal EF per ECHO 2014    Hx of colonic polyps    Hypertension    Osteoarthritis    Osteopenia    Podagra 04/29/2014   RLS (restless legs syndrome) 07/10/2009    Past Surgical History:  Procedure Laterality Date   BACK SURGERY  2000   BREAST SURGERY     NEGATIVE BIOPSY   TONSILLECTOMY      There were no vitals filed for this visit.   Subjective Assessment - 11/01/21 2113     Subjective Pt states minimal soreness in last week. Is wearing OA unloader brace as a "trial" .    Patient Stated Goals decreased pain.    Currently in Pain? No/denies    Pain Score 0-No pain                               [facility] Adult PT Treatment/Exercise - 11/01/21 0001       Exercises   Exercises Knee/Hip      Knee/Hip Exercises: Stretches   Active Hamstring Stretch 3 reps;30 seconds    Active Hamstring Stretch Limitations seated      Knee/Hip Exercises: Aerobic   Recumbent Bike L1 x 8 min;      Knee/Hip Exercises: Standing   Other Standing Knee Exercises L/R and A/P weight shifts x20 each; bwd stepping with weight shifts  on R x 10;    Other Standing Knee Exercises Bwd walking 20 ft x 3;  amulation 35 ft x 6 with cuing for increased knee extension, practice for use with SPC.      Knee/Hip Exercises: Seated   Long Arc Quad 10 reps;Right      Knee/Hip Exercises: Supine   Quad Sets --    Heel Slides Right;15 reps    Straight Leg Raises Both;20 reps      Manual Therapy   Manual Therapy Joint mobilization;Passive ROM    Joint Mobilization to increase knee extension    Passive ROM gentile ROM for increasing knee flexion and extension                     PT Education - 11/01/21 2114     Education Details reviewed and practiced donning/doffing brace independently    Person(s) Educated Patient    Methods Explanation;Demonstration;Tactile cues;Verbal cues    Comprehension Verbalized understanding;Returned demonstration;Verbal cues required;Tactile cues required;Need further instruction  PT Short Term Goals - 10/21/21 1213       PT SHORT TERM GOAL #1   Title Pt will become independent with iniital  HEP    Time 2    Period Weeks    Status Achieved    Target Date 10/07/21               PT Long Term Goals - 09/23/21 1406       PT LONG TERM GOAL #1   Title Pt will become independent with final HEP    Time 8    Period Weeks    Status New    Target Date 11/18/21      PT LONG TERM GOAL #2   Title Pt to demo improved R knee ROM by at least 15 degrees for Extension.    Time 8    Period Weeks    Status New    Target Date 11/18/21      PT LONG TERM GOAL #3   Title Pt to demo improved strength of R hip and knee to at least 4+/5 to improve gait and stairs.    Time 8    Period Weeks    Status New    Target Date 11/18/21      PT LONG TERM GOAL #4   Title Pt to demo ability for recipricol pattern on stairs, with 1 UE support, to improve ability for home navigation.    Time 8    Period Weeks    Status New    Target Date 11/18/21                    Plan - 11/01/21 2117     Clinical Impression Statement Education and practice for indpendent brace donning. Pt is showing improvements with ability for strengthening in seated and supine positions and is also doing better with improving knee extension in supine. She is still having significant difficulty with knee extension in standing position. Knee continues to be swollen, and there is audible joint grinding with transfers, however, pt is having significantly less pain. Pt to benefit from another 1-2 weeks of PT, for continued eduction on ther ex, improving extension and gait mechanics, and review of stairs.    Personal Factors and Comorbidities Comorbidity 1    Comorbidities OA    Examination-Activity Limitations Stand;Locomotion Level;Transfers;Squat;Stairs    Examination-Participation Restrictions Church;Cleaning;Meal Prep;Community Activity;Shop    Stability/Clinical Decision Making Stable/Uncomplicated    Rehab Potential Good    PT Frequency 2x / week    PT Duration 8 weeks    PT Treatment/Interventions ADLs/Self Care Home Management;Cryotherapy;Iontophoresis 4mg /ml Dexamethasone;Ultrasound;DME Instruction;Balance training;Therapeutic exercise;Therapeutic activities;Functional mobility training;Stair training;Gait training;Neuromuscular re-education;Patient/family education;Manual techniques;Passive range of motion;Dry needling;Taping;Energy conservation;Vasopneumatic Device    PT Home Exercise Plan [id number]    Consulted and Agree with Plan of Care Patient             Patient will benefit from skilled therapeutic intervention in order to improve the following deficits and impairments:  Abnormal gait, Pain, Decreased mobility, Increased muscle spasms, Hypomobility, Decreased strength, Decreased range of motion, Decreased activity tolerance, Decreased balance, Increased edema, Impaired flexibility  Visit Diagnosis: Chronic pain of right knee  Muscle weakness  (generalized)     Problem List Patient Active Problem List   Diagnosis Date Noted   Depression, recurrent (HCC) 02/20/2020   Prediabetes 01/26/2019   History of exercise intolerance 01/26/2019   HLD (hyperlipidemia) 04/07/2018   DJD (degenerative joint disease) 02/09/2010  PVCs (premature ventricular contractions) 07/20/2009   Anxiety and depression 07/16/2009   Cardiomyopathy (HCC) 04/23/2007   HTN (hypertension) 04/15/2007   Osteopenia 04/15/2007   Sedalia Muta, PT, DPT 9:23 PM  11/01/21    [facility] [address] [place], [place], [address] Phone: [phone]   Fax:  [phone]  Name: LEAHANN LEMPKE MRN: [id number] Date of Birth: 1927-05-14

## 2021-11-10 ENCOUNTER — Encounter: Payer: Self-pay | Admitting: Physical Therapy

## 2021-11-10 ENCOUNTER — Other Ambulatory Visit: Payer: Self-pay

## 2021-11-10 ENCOUNTER — Ambulatory Visit (INDEPENDENT_AMBULATORY_CARE_PROVIDER_SITE_OTHER): Payer: Medicare PPO | Admitting: Physical Therapy

## 2021-11-10 DIAGNOSIS — M6281 Muscle weakness (generalized): Secondary | ICD-10-CM

## 2021-11-10 DIAGNOSIS — G8929 Other chronic pain: Secondary | ICD-10-CM

## 2021-11-10 DIAGNOSIS — M25561 Pain in right knee: Secondary | ICD-10-CM

## 2021-11-10 NOTE — Therapy (Signed)
[facility] [address] [place], [place], [address] Phone: [phone]   Fax:  [phone]  Physical Therapy Treatment/Discharge  Patient Details  Name: [name] [name] MRN: [id number] Date of Birth: 1927/03/27 Referring Provider (PT): Lynne Leader   Encounter Date: 11/10/2021   PT End of Session - 11/10/21 1353     Visit Number 7    Number of Visits 12    Date for PT Re-Evaluation 11/18/21    Authorization Type [employer]    PT Start Time 1303    PT Stop Time 1345    PT Time Calculation (min) 42 min    Activity Tolerance Patient tolerated treatment well    Behavior During Therapy [facility] for tasks assessed/performed             Past Medical History:  Diagnosis Date   Anemia    Anxiety and depression 07/16/2009   ASD (atrial septal defect) per bubble study, 2004    Cardiomyopathy, normal cath 2007, normal EF per ECHO 2014    Hx of colonic polyps    Hypertension    Osteoarthritis    Osteopenia    Podagra 04/29/2014   RLS (restless legs syndrome) 07/10/2009    Past Surgical History:  Procedure Laterality Date   BACK SURGERY  2000   BREAST SURGERY     NEGATIVE BIOPSY   TONSILLECTOMY      There were no vitals filed for this visit.   Subjective Assessment - 11/10/21 1354     Subjective Pt states no pain in knee for a couple weeks. She is not using OA unloader brace, but did obtain new brace that she has been wearing with good comfort.    Currently in Pain? No/denies    Pain Score 0-No pain                [facility] PT Assessment - 11/10/21 0001       AROM   Right Knee Extension -10    Right Knee Flexion 115                           [facility] Adult PT Treatment/Exercise - 11/10/21 0001       Exercises   Exercises Knee/Hip      Knee/Hip Exercises: Stretches   Active Hamstring Stretch 3 reps;30 seconds    Active Hamstring Stretch Limitations seated      Knee/Hip Exercises: Aerobic    Recumbent Bike L1 x 6 min;      Knee/Hip Exercises: Standing   Other Standing Knee Exercises --    Other Standing Knee Exercises ambulation with SPC, 35 ft x 6, then x 4, with education on optimal mechanics and use of SPC.      Knee/Hip Exercises: Seated   Long Arc Quad Right;15 reps      Knee/Hip Exercises: Supine   Quad Sets 15 reps    Heel Slides Right;15 reps    Straight Leg Raises Both;20 reps      Manual Therapy   Manual Therapy Joint mobilization;Passive ROM    Joint Mobilization --    Passive ROM gentile ROM for increasing knee flexion and extension                     PT Education - 11/10/21 1406     Education Details reviewed and practiced donning/dofifng brace independently, reviewed optimal use of SPC, reviewed final HEP.    Person(s) Educated  Patient    Methods Explanation;Demonstration;Tactile cues;Verbal cues;Handout    Comprehension Verbalized understanding;Returned demonstration;Verbal cues required;Tactile cues required;Need further instruction              PT Short Term Goals - 10/21/21 1213       PT SHORT TERM GOAL #1   Title Pt will become independent with iniital  HEP    Time 2    Period Weeks    Status Achieved    Target Date 10/07/21               PT Long Term Goals - 11/10/21 1407       PT LONG TERM GOAL #1   Title Pt will become independent with final HEP    Time 8    Period Weeks    Status Achieved    Target Date 11/18/21      PT LONG TERM GOAL #2   Title Pt to demo improved R knee ROM by at least 15 degrees for Extension.    Time 8    Period Weeks    Status Achieved    Target Date 11/18/21      PT LONG TERM GOAL #3   Title Pt to demo improved strength of R hip and knee to at least 4+/5 to improve gait and stairs.    Baseline knee: 4/5 for ext    Time 8    Period Weeks    Status Partially Met    Target Date 11/18/21      PT LONG TERM GOAL #4   Title Pt to demo ability for recipricol pattern on stairs,  with 1 UE support, to improve ability for home navigation.    Baseline needs to do step - to for majority of time.    Time 8    Period Weeks    Status Partially Met    Target Date 11/18/21                   Plan - 11/10/21 1411     Clinical Impression Statement Pt doing well at this time. Pt at max function with no pain in knee. She is still quite limited with TKE in standing, as well as functional stability, due to severe OA and condition of knee. Continued to encourage use of SPC for decreasing load on knee, as well as continued use of brace. Final HEP reviewed, as well as all questions answered. Pt doing well at this time, will f/u with MD in future if she has increased pain or difficulty.    Personal Factors and Comorbidities Comorbidity 1    Comorbidities OA    Examination-Activity Limitations Stand;Locomotion Level;Transfers;Squat;Stairs    Examination-Participation Restrictions Church;Cleaning;Meal Prep;Community Activity;Shop    Stability/Clinical Decision Making Stable/Uncomplicated    Rehab Potential Good    PT Frequency 2x / week    PT Duration 8 weeks    PT Treatment/Interventions ADLs/Self Care Home Management;Cryotherapy;Iontophoresis 48m/ml Dexamethasone;Ultrasound;DME Instruction;Balance training;Therapeutic exercise;Therapeutic activities;Functional mobility training;Stair training;Gait training;Neuromuscular re-education;Patient/family education;Manual techniques;Passive range of motion;Dry needling;Taping;Energy conservation;Vasopneumatic Device    PT Home Exercise Plan [id number]    Consulted and Agree with Plan of Care Patient             Patient will benefit from skilled therapeutic intervention in order to improve the following deficits and impairments:  Abnormal gait, Pain, Decreased mobility, Increased muscle spasms, Hypomobility, Decreased strength, Decreased range of motion, Decreased activity tolerance, Decreased balance, Increased edema, Impaired  flexibility  Visit Diagnosis: Chronic  pain of right knee  Muscle weakness (generalized)     Problem List Patient Active Problem List   Diagnosis Date Noted   Depression, recurrent ([place]) 02/20/2020   Prediabetes 01/26/2019   History of exercise intolerance 01/26/2019   HLD (hyperlipidemia) 04/07/2018   DJD (degenerative joint disease) 02/09/2010   PVCs (premature ventricular contractions) 07/20/2009   Anxiety and depression 07/16/2009   Cardiomyopathy ([place]) 04/23/2007   HTN (hypertension) 04/15/2007   Osteopenia 04/15/2007    Lyndee Hensen, PT, DPT 2:16 PM  11/10/21    [facility] [address] [place], [place], [address] Phone: [phone]   Fax:  [phone]  Name: [name] [name] MRN: [id number] Date of Birth: Dec 08, 1926  PHYSICAL THERAPY DISCHARGE SUMMARY  Visits from Start of Care: 7 Plan: Patient agrees to discharge.  Patient goals were met. Patient is being discharged due to meeting the stated rehab goals.     Lyndee Hensen, PT, DPT 2:17 PM  11/10/21

## 2021-11-15 ENCOUNTER — Encounter: Payer: Medicare PPO | Admitting: Physical Therapy

## 2021-11-27 ENCOUNTER — Other Ambulatory Visit: Payer: Self-pay | Admitting: Cardiovascular Disease

## 2021-12-05 NOTE — Progress Notes (Signed)
Date:  12/07/2021   ID:  [name] [name], Antonacci 08/16/1927, MRN [id number]  Provider Location: Office  PCP:  Bary Leriche, PA-C  Cardiologist:  Charlton Haws, MD   Electrophysiologist:  None   Evaluation Performed:  Follow-Up Visit  Chief Complaint:  Chronic Systolic CHF  History of Present Illness:    88 y.o. non ischemic DCM normal cath back in 2007 with EF at that time 35%  EF decreased to 25-30% on TTE 12/17/18 and aldactone and entresto added. F/U TTE done 04/21/20 showed improvement to 40-45%   She is swimming at [employer] to finish a memoir about the town of [place]  She is a [employer] grad   Lives with only daughter and has 4 grand children   Recently had injection of right knee for pain and seeing PM&R  BP still a bit high Discussed high dose entresto   Past Medical History:  Diagnosis Date   Anemia    Anxiety and depression 07/16/2009   ASD (atrial septal defect) per bubble study, 2004    Cardiomyopathy, normal cath 2007, normal EF per ECHO 2014    Hx of colonic polyps    Hypertension    Osteoarthritis    Osteopenia    Podagra 04/29/2014   RLS (restless legs syndrome) 07/10/2009   Past Surgical History:  Procedure Laterality Date   BACK SURGERY  2000   BREAST SURGERY     NEGATIVE BIOPSY   TONSILLECTOMY       Current Meds  Medication Sig   buPROPion (WELLBUTRIN XL) 150 MG 24 hr tablet Take 450 mg by mouth every morning.    carvedilol (COREG) 25 MG tablet TAKE 1 TABLET BY MOUTH 2 TIMES DAILY WITH A MEAL.   diclofenac Sodium (VOLTAREN) 1 % GEL Apply 4 g topically 4 (four) times daily. To affected joint.   ENTRESTO 49-51 MG TAKE 1 TABLET BY MOUTH TWICE A DAY   furosemide (LASIX) 20 MG tablet TAKE 1 TABLET BY MOUTH EVERY OTHER DAY   latanoprost (XALATAN) 0.005 % ophthalmic solution Place 1 drop into both eyes every evening.   mirtazapine (REMERON) 30 MG tablet Take 30 mg by mouth at bedtime.    Multiple Vitamin (MULTIVITAMIN) tablet Take 1  tablet by mouth every morning.   polyethylene glycol (MIRALAX / GLYCOLAX) packet Take 17 g by mouth 3 (three) times a week. Mon, wed, and fri   spironolactone (ALDACTONE) 25 MG tablet TAKE 1 TABLET BY MOUTH EVERY OTHER DAY   VYZULTA 0.024 % SOLN SMARTSIG:1 Drop(s) In Eye(s) Every Evening     Allergies:   Shellfish allergy, Codeine, Hydrocodone, and Adhesive [tape]   Social History   Tobacco Use   Smoking status: Never   Smokeless tobacco: Never  Substance Use Topics   Alcohol use: No    Alcohol/week: 0.0 standard drinks   Drug use: No     Family Hx: The patient's family history includes Heart attack in her father; Heart disease in her mother; Hypertension in an other family member. There is no history of Diabetes or Colon cancer.  ROS:   Please see the history of present illness.     All other systems reviewed and are negative.   Prior CV studies:   The following studies were reviewed today:  Echo 12/17/18 Echo 04/21/20   Labs/Other Tests and Data Reviewed:    EKG:   12/07/2021 SR rate 77 nonspecific ST changes   Recent Labs: No results found for requested  labs within last 8760 hours.   Recent Lipid Panel Lab Results  Component Value Date/Time   CHOL 188 08/23/2020 02:26 PM   TRIG 137 08/23/2020 02:26 PM   HDL 61 08/23/2020 02:26 PM   CHOLHDL 3.1 08/23/2020 02:26 PM   LDLCALC 103 (H) 08/23/2020 02:26 PM   LDLDIRECT 116.5 11/08/2012 09:35 AM    Wt Readings from Last 3 Encounters:  10/10/21 141 lb 9.6 oz (64.2 kg)  09/13/21 138 lb 3.2 oz (62.7 kg)  08/15/21 143 lb 12.8 oz (65.2 kg)     Objective:    Vital Signs:  Ht 5\' 5"  (1.651 m)    LMP  (LMP Unknown)    BMI 23.56 kg/m  My reading after 15 minutes 138/80 mmHg   Affect appropriate Healthy:  appears stated age HEENT: normal Neck supple with no adenopathy JVP normal no bruits no thyromegaly Lungs clear with no wheezing and good diaphragmatic motion Heart:  S1/S2 no murmur, no rub, gallop or click PMI  enlarged  Abdomen: benighn, BS positve, no tenderness, no AAA no bruit.  No HSM or HJR Distal pulses intact with no bruits No edema Neuro non-focal Skin warm and dry No muscular weakness   ASSESSMENT & PLAN:    HTN: has been well controlled f/u home readings consider increasing entresto to high dose Check BMET/CBC today  DCM: EF declined echo 12/17/18 25-30% Entresto and Aldactone added Cr 1.6 08/23/20 should follow q 3-6 months with medical changes f/u echo 04/21/20 showed improvement to EF 40-45%   PVC" asymptomatic better with beta blocker   Anxiety/Depression  Stable continue welburin   Ortho:  tricompartmental osteoarthritis with chondrocalcinosis right knee f/u sports medicine has had PT and injections with drainage of effusions Using Voltaren Gel  Medication Adjustments/Labs and Tests Ordered:  Entresto high dose   Tests Ordered:  Echo June 2023 DCM CBC BMET   Medication Changes: No orders of the defined types were placed in this encounter.   Disposition:  Follow up in  6 months  Signed, July 2023, MD  12/07/2021 9:34 AM    [facility]

## 2021-12-07 ENCOUNTER — Other Ambulatory Visit: Payer: Self-pay

## 2021-12-07 ENCOUNTER — Encounter: Payer: Self-pay | Admitting: Cardiovascular Disease

## 2021-12-07 ENCOUNTER — Ambulatory Visit: Payer: Medicare PPO | Admitting: Cardiovascular Disease

## 2021-12-07 VITALS — BP 152/78 | HR 76 | Ht 65.0 in | Wt 139.0 lb

## 2021-12-07 DIAGNOSIS — I10 Essential (primary) hypertension: Secondary | ICD-10-CM

## 2021-12-07 DIAGNOSIS — I420 Dilated cardiomyopathy: Secondary | ICD-10-CM

## 2021-12-07 LAB — CBC WITH DIFFERENTIAL/PLATELET
Basophils Absolute: 0.1 10*3/uL (ref 0.0–0.2)
Basos: 1 %
EOS (ABSOLUTE): 0.4 10*3/uL (ref 0.0–0.4)
Eos: 6 %
Hematocrit: 32.5 % — ABNORMAL LOW (ref 34.0–46.6)
Hemoglobin: 11.1 g/dL (ref 11.1–15.9)
Immature Grans (Abs): 0.1 10*3/uL (ref 0.0–0.1)
Immature Granulocytes: 1 %
Lymphocytes Absolute: 2 10*3/uL (ref 0.7–3.1)
Lymphs: 27 %
MCH: 32.5 pg (ref 26.6–33.0)
MCHC: 34.2 g/dL (ref 31.5–35.7)
MCV: 95 fL (ref 79–97)
Monocytes Absolute: 0.9 10*3/uL (ref 0.1–0.9)
Monocytes: 11 %
Neutrophils Absolute: 4.2 10*3/uL (ref 1.4–7.0)
Neutrophils: 54 %
Platelets: 293 10*3/uL (ref 150–450)
RBC: 3.42 x10E6/uL — ABNORMAL LOW (ref 3.77–5.28)
RDW: 12.4 % (ref 11.7–15.4)
WBC: 7.6 10*3/uL (ref 3.4–10.8)

## 2021-12-07 LAB — BASIC METABOLIC PANEL
BUN/Creatinine Ratio: 21 (ref 12–28)
BUN: 30 mg/dL (ref 10–36)
CO2: 18 mmol/L — ABNORMAL LOW (ref 20–29)
Calcium: 9.6 mg/dL (ref 8.7–10.3)
Chloride: 103 mmol/L (ref 96–106)
Creatinine, Ser: 1.44 mg/dL — ABNORMAL HIGH (ref 0.57–1.00)
Glucose: 106 mg/dL — ABNORMAL HIGH (ref 70–99)
Potassium: 5.2 mmol/L (ref 3.5–5.2)
Sodium: 133 mmol/L — ABNORMAL LOW (ref 134–144)
eGFR: 34 mL/min/{1.73_m2} — ABNORMAL LOW (ref >59)

## 2021-12-07 MED ORDER — ENTRESTO 97-103 MG PO TABS: 1.0000 | ORAL_TABLET | Freq: Two times a day (BID) | ORAL | 11 refills | Status: DC

## 2021-12-07 NOTE — Patient Instructions (Addendum)
Medication Instructions:  Your physician has recommended you make the following change in your medication:  1-INCREASE Entresto 97/103 mg by mouth twice daily  *If you need a refill on your cardiac medications before your next appointment, please call your pharmacy*  Lab Work: Your physician recommends that you have lab work today- BMET and CBC  If you have labs (blood work) drawn today and your tests are completely normal, you will receive your results only by: [employer] (if you have MyChart) OR A paper copy in the mail If you have any lab test that is abnormal or we need to change your treatment, we will call you to review the results.  Testing/Procedures: Your physician has requested that you have an echocardiogram in June. Echocardiography is a painless test that uses sound waves to create images of your heart. It provides your doctor with information about the size and shape of your heart and how well your hearts chambers and valves are working. This procedure takes approximately one hour. There are no restrictions for this procedure.  Follow-Up: At [facility], you and your health needs are our priority.  As part of our continuing mission to provide you with exceptional heart care, we have created designated Provider Care Teams.  These Care Teams include your primary Cardiologist (physician) and Advanced Practice Providers (APPs -  Physician Assistants and Nurse Practitioners) who all work together to provide you with the care you need, when you need it.  We recommend signing up for the patient portal called "MyChart".  Sign up information is provided on this After Visit Summary.  MyChart is used to connect with patients for Virtual Visits (Telemedicine).  Patients are able to view lab/test results, encounter notes, upcoming appointments, etc.  Non-urgent messages can be sent to your provider as well.   To learn more about what you can do with MyChart, go to  [email or web address].    Your next appointment:   6 months  The format for your next appointment:   In Person  Provider:   Charlton Haws, MD {

## 2021-12-09 ENCOUNTER — Other Ambulatory Visit: Payer: Self-pay

## 2021-12-09 DIAGNOSIS — I420 Dilated cardiomyopathy: Secondary | ICD-10-CM

## 2021-12-22 ENCOUNTER — Ambulatory Visit (INDEPENDENT_AMBULATORY_CARE_PROVIDER_SITE_OTHER): Payer: Medicare PPO

## 2021-12-22 ENCOUNTER — Other Ambulatory Visit: Payer: Self-pay

## 2021-12-22 ENCOUNTER — Encounter: Payer: Self-pay | Admitting: Family Medicine

## 2021-12-22 ENCOUNTER — Ambulatory Visit (INDEPENDENT_AMBULATORY_CARE_PROVIDER_SITE_OTHER): Payer: Medicare PPO | Admitting: Family Medicine

## 2021-12-22 VITALS — BP 126/78 | HR 75 | Ht 65.0 in | Wt 143.2 lb

## 2021-12-22 VITALS — BP 128/74 | HR 75 | Temp 97.3°F | Wt 142.2 lb

## 2021-12-22 DIAGNOSIS — Z0000 Encounter for general adult medical examination without abnormal findings: Secondary | ICD-10-CM

## 2021-12-22 DIAGNOSIS — G8929 Other chronic pain: Secondary | ICD-10-CM | POA: Diagnosis not present

## 2021-12-22 DIAGNOSIS — M1711 Unilateral primary osteoarthritis, right knee: Secondary | ICD-10-CM

## 2021-12-22 DIAGNOSIS — M25561 Pain in right knee: Secondary | ICD-10-CM

## 2021-12-22 NOTE — Progress Notes (Signed)
Subjective:   [name] [name] is a 88 y.o. female who presents for Medicare Annual (Subsequent) preventive examination.  Review of Systems     Cardiac Risk Factors include: advanced age (>70men, >76 women);dyslipidemia;hypertension     Objective:    Today's Vitals   12/22/21 1145 12/22/21 1153 12/22/21 1215  BP: (!) 142/80  128/74  Pulse: 75    Temp: (!) 97.3 F (36.3 C)    SpO2: 99%    Weight: 142 lb 3.2 oz (64.5 kg)    PainSc:  5     Body mass index is 23.66 kg/m.  Advanced Directives 12/22/2021 09/23/2021 04/14/2021 12/16/2020 11/21/2019 09/04/2016  Does Patient Have a Medical Advance Directive? Yes Yes Yes Yes Yes Yes  Type of Estate agent of [employer] Power of [place];Living will Healthcare Power of [place];Living will Healthcare Power of [place];Living will Living will;Healthcare Power of Attorney Living will  Does patient want to make changes to medical advance directive? - No - Patient declined No - Patient declined - No - Patient declined No - Patient declined  Copy of Healthcare Power of Attorney in Chart? Yes - validated most recent copy scanned in chart (See row information) No - copy requested No - copy requested No - copy requested No - copy requested No - copy requested  Would patient like information on creating a medical advance directive? - No - Patient declined - - - -    Current Medications (verified) Outpatient Encounter Medications as of 12/22/2021  Medication Sig   buPROPion (WELLBUTRIN XL) 150 MG 24 hr tablet Take 450 mg by mouth every morning.    carvedilol (COREG) 25 MG tablet TAKE 1 TABLET BY MOUTH 2 TIMES DAILY WITH A MEAL.   diclofenac Sodium (VOLTAREN) 1 % GEL Apply 4 g topically 4 (four) times daily. To affected joint.   furosemide (LASIX) 20 MG tablet TAKE 1 TABLET BY MOUTH EVERY OTHER DAY   mirtazapine (REMERON) 30 MG tablet Take 30 mg by mouth at bedtime.    Multiple Vitamin (MULTIVITAMIN) tablet Take 1 tablet  by mouth every morning.   Multiple Vitamins-Minerals (HAIR SKIN AND NAILS FORMULA PO) Take 2 tablets by mouth daily.   NON FORMULARY Take 3 tablets by mouth daily. Curumin for joint pain   polyethylene glycol (MIRALAX / GLYCOLAX) packet Take 17 g by mouth 3 (three) times a week. Mon, wed, and fri   sacubitril-valsartan (ENTRESTO) 97-103 MG Take 1 tablet by mouth 2 (two) times daily.   spironolactone (ALDACTONE) 25 MG tablet TAKE 1 TABLET BY MOUTH EVERY OTHER DAY   VYZULTA 0.024 % SOLN SMARTSIG:1 Drop(s) In Eye(s) Every Evening   [DISCONTINUED] latanoprost (XALATAN) 0.005 % ophthalmic solution Place 1 drop into both eyes every evening.   No facility-administered encounter medications on file as of 12/22/2021.    Allergies (verified) Shellfish allergy, Codeine, Hydrocodone, and Adhesive [tape]   History: Past Medical History:  Diagnosis Date   Anemia    Anxiety and depression 07/16/2009   ASD (atrial septal defect) per bubble study, 2004    Cardiomyopathy, normal cath 2007, normal EF per ECHO 2014    Hx of colonic polyps    Hypertension    Osteoarthritis    Osteopenia    Podagra 04/29/2014   RLS (restless legs syndrome) 07/10/2009   Past Surgical History:  Procedure Laterality Date   BACK SURGERY  2000   BREAST SURGERY     NEGATIVE BIOPSY   TONSILLECTOMY  Family History  Problem Relation Age of Onset   Heart attack Father    Heart disease Mother    Hypertension Other    Diabetes Neg Hx    Colon cancer Neg Hx    Social History   Socioeconomic History   Marital status: Widowed    Spouse name: Not on file   Number of children: 1   Years of education: Not on file   Highest education level: Not on file  Occupational History   Occupation: Motivational Speaker    Employer: RETIRED  Tobacco Use   Smoking status: Never   Smokeless tobacco: Never  Substance and Sexual Activity   Alcohol use: No    Alcohol/week: 0.0 standard drinks   Drug use: No   Sexual activity: Not  Currently  Other Topics Concern   Not on file  Social History Narrative   Widow, moved to [facility]. Has a daughter Vikki Ports and a G-d in [place]; lost her son-in-law, moved w/ daughter 2014 due to depression.   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[place], [place]    Social Determinants of Health   Financial Resource Strain: Low Risk    Difficulty of Paying Living Expenses: Not hard at all  Food Insecurity: No Food Insecurity   Worried About Programme researcher, broadcasting/film/video in the Last Year: Never true   Barista in the Last Year: Never true  Transportation Needs: No Transportation Needs   Lack of Transportation (Medical): No   Lack of Transportation (Non-Medical): No  Physical Activity: Insufficiently Active   Days of Exercise per Week: 3 days   Minutes of Exercise per Session: 10 min  Stress: No Stress Concern Present   Feeling of Stress : Not at all  Social Connections: Moderately Integrated   Frequency of Communication with Friends and Family: More than three times a week   Frequency of Social Gatherings with Friends and Family: More than three times a week   Attends Religious Services: More than 4 times per year   Active Member of [employer] or Organizations: Yes   Attends Banker Meetings: 1 to 4 times per year   Marital Status: Widowed    Tobacco Counseling Counseling given: Not Answered   Clinical Intake:  Pre-visit preparation completed: Yes  Pain : 0-10 Pain Score: 5  Pain Type: Chronic pain Pain Location: Knee Pain Orientation: Right Pain Descriptors / Indicators: Stabbing Pain Onset: Yesterday Pain Frequency: Intermittent     BMI - recorded: 23.66 Nutritional Status: BMI of 19-24  Normal Nutritional Risks: None Diabetes: No  How often do you need to have someone help you when you read instructions, pamphlets, or other written materials from your doctor or pharmacy?: 1 - Never  Diabetic?no  Interpreter Needed?: No  Information entered by :: Lanier Ensign,  LPN   Activities of Daily Living In your present state of health, do you have any difficulty performing the following activities: 12/22/2021  Hearing? Y  Comment wax build up and wears hearing aids  Vision? N  Difficulty concentrating or making decisions? N  Walking or climbing stairs? Y  Comment slowly  Dressing or bathing? N  Doing errands, shopping? N  Preparing Food and eating ? N  Using the Toilet? N  In the past six months, have you accidently leaked urine? N  Do you have problems with loss of bowel control? N  Managing your Medications? N  Managing your Finances? N  Housekeeping or managing your Housekeeping? N  Some recent data might be hidden    Patient Care Team: Allwardt, Crist Infante, PA-C as PCP - General (Physician Assistant) Wendall Stade, MD as PCP - Cardiology (Cardiology) Archer Asa, MD as Consulting Physician (Psychiatry) Wendall Stade, MD as Consulting Physician (Cardiology) Davina Poke as Consulting Physician (Optometry)  Indicate any recent Medical Services you may have received from other than [facility] providers in the past year (date may be approximate).     Assessment:   This is a routine wellness examination for [name].  Hearing/Vision screen Hearing Screening - Comments:: Pt feels that there is wax build up and wears hearing aid  Vision Screening - Comments:: Pt follows up with Dr Shea Evans for annual eye exams   Dietary issues and exercise activities discussed: Current Exercise Habits: Structured exercise class, Type of exercise: Other - see comments (stationary bike), Time (Minutes): 15, Frequency (Times/Week): 3, Weekly Exercise (Minutes/Week): 45   Goals Addressed             This Visit's Progress    Patient Stated       Stay active and healthy       Depression Screen PHQ 2/9 Scores 12/22/2021 05/03/2021 12/16/2020 02/20/2020 11/21/2019 08/08/2019 04/28/2019  PHQ - 2 Score 0 0 0 0 0 0 0  PHQ- 9 Score - - - - - 0 0    Fall Risk Fall Risk   12/22/2021 06/21/2021 05/03/2021 12/16/2020 08/23/2020  Falls in the past year? 0 0 0 0 0  Number falls in past yr: 0 - - 0 -  Injury with Fall? 0 - - 0 -  Risk for fall due to : Impaired vision No Fall Risks - Impaired vision No Fall Risks  Follow up Falls prevention discussed - - Falls prevention discussed -    FALL RISK PREVENTION PERTAINING TO THE HOME:  Any stairs in or around the home? Yes  If so, are there any without handrails? No  Home free of loose throw rugs in walkways, pet beds, electrical cords, etc? Yes  Adequate lighting in your home to reduce risk of falls? Yes   ASSISTIVE DEVICES UTILIZED TO PREVENT FALLS:  Life alert? No  Use of a cane, walker or w/c? No  Grab bars in the bathroom? Yes  Shower chair or bench in shower? No  Elevated toilet seat or a handicapped toilet? No   TIMED UP AND GO:  Was the test performed? Yes .  Length of time to ambulate 10 feet: 15 sec.   Gait slow and steady without use of assistive device  Cognitive Function:     6CIT Screen 12/22/2021 12/16/2020 11/21/2019  What Year? 0 points 0 points 0 points  What month? 0 points 0 points 0 points  What time? 0 points - 0 points  Count back from 20 0 points 0 points 0 points  Months in reverse 0 points 0 points 0 points  Repeat phrase 0 points 0 points 0 points  Total Score 0 - 0    Immunizations Immunization History  Administered Date(s) Administered   Fluad Quad(high Dose 65+) 09/13/2020   Influenza Split 09/04/2011, 09/02/2012, 07/18/2015   Influenza Whole 10/21/2007, 08/03/2008, 08/12/2009, 08/03/2010   Influenza, High Dose Seasonal PF 07/18/2016, 08/08/2017, 07/08/2018, 08/05/2019, 09/13/2020   Influenza,inj,Quad PF,6+ Mos 09/11/2013, 08/15/2014   Influenza-Unspecified 08/09/2017, 07/08/2018, 09/08/2021   PFIZER Comirnaty(Gray Top)Covid-19 Tri-Sucrose Vaccine 03/03/2021   PFIZER(Purple Top)SARS-COV-2 Vaccination 12/25/2019, 01/19/2020, 08/21/2020   Pfizer Covid-19 Vaccine Bivalent  Booster 5y-11y 08/23/2021  Pneumococcal Conjugate-13 03/10/2014   Pneumococcal Polysaccharide-23 12/11/2006, 10/02/2017   Td 12/22/2006, 03/20/2017   Zoster Recombinat (Shingrix) 01/26/2018, 06/10/2018   Zoster, Live 03/09/2008    TDAP status: Up to date  Flu Vaccine status: Up to date  Pneumococcal vaccine status: Up to date  Covid-19 vaccine status: Completed vaccines  Qualifies for Shingles Vaccine? Yes   Zostavax completed Yes   Shingrix Completed?: Yes  Screening Tests Health Maintenance  Topic Date Due   MAMMOGRAM  03/17/2022   TETANUS/TDAP  03/21/2027   Pneumonia Vaccine 74+ Years old  Completed   INFLUENZA VACCINE  Completed   DEXA SCAN  Completed   COVID-19 Vaccine  Completed   Zoster Vaccines- Shingrix  Completed   HPV VACCINES  Aged Out    Health Maintenance  There are no preventive care reminders to display for this patient.   Colorectal cancer screening: No longer required.   Mammogram status: Completed 03/17/21. Repeat every year  Bone Density status: Completed 04/11/18. Results reflect: Bone density results: OSTEOPENIA. Repeat every 2 years.   Additional Screening:  Vision Screening: Recommended annual ophthalmology exams for early detection of glaucoma and other disorders of the eye. Is the patient up to date with their annual eye exam?  Yes  Who is the provider or what is the name of the office in which the patient attends annual eye exams? Dr Shea Evans If pt is not established with a provider, would they like to be referred to a provider to establish care? No .   Dental Screening: Recommended annual dental exams for proper oral hygiene  Community Resource Referral / Chronic Care Management: CRR required this visit?  No   CCM required this visit?  No      Plan:     I have personally reviewed and noted the following in the patients chart:   Medical and social history Use of alcohol, tobacco or illicit drugs  Current medications and  supplements including opioid prescriptions.  Functional ability and status Nutritional status Physical activity Advanced directives List of other physicians Hospitalizations, surgeries, and ER visits in previous 12 months Vitals Screenings to include cognitive, depression, and falls Referrals and appointments  In addition, I have reviewed and discussed with patient certain preventive protocols, quality metrics, and best practice recommendations. A written personalized care plan for preventive services as well as general preventive health recommendations were provided to patient.     Marzella Schlein, LPN   11/18/1094   Nurse Notes: pt was seen today for AWV and had concerns of right knee pain that she is seeing Dr Kandee Keen @ orthopedic practice, she also stated that cardiology Dr Eden Emms stated that she limit potassium and 1 liter of water daily. Pt has requested an appt to be seen 12/23/21 to address concerns along with Ear irrigation to relieve wax build up. Please advise

## 2021-12-22 NOTE — Progress Notes (Signed)
I, Christoper Fabian, LAT, ATC, am serving as scribe for Dr. [name].  [name] [name] is a 88 y.o. female who presents to [facility] today for continued chronic R knee pain. Pt was last seen by Dr. Denyse Amass on 10/26/21 and had a Zilretta injection in her R knee. Pt completed 7 total PT visits. She was previously seen on 08/19/21 and had a Duralone injection in her R knee and then had a R knee aspiration/injection on 08/29/21. Today, pt reports that her R knee pain returned last night while she was in bed.  She has been wearing a double-hinged knee brace.  Dx imaging: 04/05/21 R knee XR  Pertinent review of systems: No fevers or chills  Relevant historical information: History depression.  Hypertension.  Osteopenia.  Overall functional and relatively fit for 88 years old.   Exam:  BP 126/78 (BP Location: Right Arm, Patient Position: Sitting, Cuff Size: Normal)    Pulse 75    Ht 5\' 5"  (1.651 m)    Wt 143 lb 3.2 oz (65 kg)    LMP  (LMP Unknown)    SpO2 94%    BMI 23.83 kg/m  General: Well Developed, well nourished, and in no acute distress.   MSK: Right knee moderate effusion decreased range of motion with crepitation.   EXAM: RIGHT KNEE 3 VIEWS   COMPARISON:  None.   FINDINGS: Mild diffuse joint space narrowing. Mild tricompartmental peripheral spurring. No acute or healing fracture. Patella normally situated in the trochlear groove. Minimal spurring of the tibial spines. There is faint medial and lateral chondrocalcinosis. Small quadriceps tendon enthesophyte. Mild subchondral cystic change in the patella. No significant joint effusion.   IMPRESSION: Mild tricompartmental osteoarthritis with chondrocalcinosis.     Electronically Signed   By: M.D.   On: 04/06/2021 22:24    I, 04/08/2021, personally (independently) visualized and performed the interpretation of the images attached in this note.     Assessment and  Plan: 88 y.o. female with knee pain thought to be due to DJD.  Unfortunately she is failing conservative management including conventional steroid injections, hyaluronic acid injections, and Zilretta.  Additionally she has had physical therapy trials and home exercise program trials.  I am not convinced that further injections will help very much.  Her last Zilretta injection was 2 months ago and we really should wait another month before we do this again.  We will go ahead and start the authorization process for another Zilretta injection to give in middle March (3 months).  However I think it is time for her to consider other options including geniculate nerve ablation and perhaps even total knee replacement.  I think a simultaneous referral to both PMNR and orthopedic surgery so she can understand her options fully is a really good idea.  Her daughter has had a hip replacement with Dr. April and would like to see him for potential knee replacement evaluation which I think is reasonable. Refer to Dr. Roda Shutters for evaluation for geniculate nerve ablation.  Check back with me around March 14 for consideration of Zilretta injection.  However she should contact me ahead of time to make sure that we are able to get it authorized and that we actually have the Zilretta ready to go.   PDMP not reviewed this encounter. Orders Placed This Encounter  Procedures   Ambulatory referral to Physical Medicine Rehab    Referral Priority:   Routine  Referral Type:   Rehabilitation    Referral Reason:   Specialty Services Required    Requested Specialty:   Physical Medicine and Rehabilitation    Number of Visits Requested:   1   Ambulatory referral to Orthopedic Surgery    Referral Priority:   Routine    Referral Type:   Surgical    Referral Reason:   Specialty Services Required    Referred to Provider:   Tarry Kos, MD    Requested Specialty:   Orthopedic Surgery    Number of Visits Requested:   1   No  orders of the defined types were placed in this encounter.    Discussed warning signs or symptoms. Please see discharge instructions. Patient expresses understanding.   The above documentation has been reviewed and is accurate and complete [name], M.D.

## 2021-12-22 NOTE — Patient Instructions (Addendum)
Good to see you today.  I've referred you to [facility] for surgical consultation and for consultation w/ Dr. Alvester Morin w/ physiatry.  Their office will call you to schedule but please let us know if you haven't heard from them in one week regarding scheduling.  Follow-up as needed.  If you want a Zilretta injection, please call the office and let them know when you're ready so that we can order the product and have it in stock for your visit.

## 2021-12-22 NOTE — Patient Instructions (Signed)
Ms. Pe , Thank you for taking time to come for your Medicare Wellness Visit. I appreciate your ongoing commitment to your health goals. Please review the following plan we discussed and let me know if I can assist you in the future.   Screening recommendations/referrals: Colonoscopy: No longer required  Mammogram: Done 03/17/21 repeat every year  Bone Density: Done 04/11/18 repeat veery 2 years  Recommended yearly ophthalmology/optometry visit for glaucoma screening and checkup Recommended yearly dental visit for hygiene and checkup  Vaccinations: Influenza vaccine: 09/08/21 Pneumococcal vaccine: Up to date Tdap vaccine: Done 03/20/17 repeat in 03/21/27 Shingles vaccine: Completed 3/16, 06/10/18   Covid-19:Completed 2/11, 3/8, 08/21/20 & 03/03/21, 08/23/21  Advanced directives: Copies in chart  Conditions/risks identified: stay active and healthy  Next appointment: Follow up in one year for your annual wellness visit    Preventive Care 65 Years and Older, Female Preventive care refers to lifestyle choices and visits with your health care provider that can promote health and wellness. What does preventive care include? A yearly physical exam. This is also called an annual well check. Dental exams once or twice a year. Routine eye exams. Ask your health care provider how often you should have your eyes checked. Personal lifestyle choices, including: Daily care of your teeth and gums. Regular physical activity. Eating a healthy diet. Avoiding tobacco and drug use. Limiting alcohol use. Practicing safe sex. Taking low-dose aspirin every day. Taking vitamin and mineral supplements as recommended by your health care provider. What happens during an annual well check? The services and screenings done by your health care provider during your annual well check will depend on your age, overall health, lifestyle risk factors, and family history of disease. Counseling  Your health care  provider may ask you questions about your: Alcohol use. Tobacco use. Drug use. Emotional well-being. Home and relationship well-being. Sexual activity. Eating habits. History of falls. Memory and ability to understand (cognition). Work and work Astronomer. Reproductive health. Screening  You may have the following tests or measurements: Height, weight, and BMI. Blood pressure. Lipid and cholesterol levels. These may be checked every 5 years, or more frequently if you are over 58 years old. Skin check. Lung cancer screening. You may have this screening every year starting at age 54 if you have a 30-pack-year history of smoking and currently smoke or have quit within the past 15 years. Fecal occult blood test (FOBT) of the stool. You may have this test every year starting at age 28. Flexible sigmoidoscopy or colonoscopy. You may have a sigmoidoscopy every 5 years or a colonoscopy every 10 years starting at age 22. Hepatitis C blood test. Hepatitis B blood test. Sexually transmitted disease (STD) testing. Diabetes screening. This is done by checking your blood sugar (glucose) after you have not eaten for a while (fasting). You may have this done every 1-3 years. Bone density scan. This is done to screen for osteoporosis. You may have this done starting at age 20. Mammogram. This may be done every 1-2 years. Talk to your health care provider about how often you should have regular mammograms. Talk with your health care provider about your test results, treatment options, and if necessary, the need for more tests. Vaccines  Your health care provider may recommend certain vaccines, such as: Influenza vaccine. This is recommended every year. Tetanus, diphtheria, and acellular pertussis (Tdap, Td) vaccine. You may need a Td booster every 10 years. Zoster vaccine. You may need this after age 44. Pneumococcal 13-valent  conjugate (PCV13) vaccine. One dose is recommended after age  52. Pneumococcal polysaccharide (PPSV23) vaccine. One dose is recommended after age 72. Talk to your health care provider about which screenings and vaccines you need and how often you need them. This information is not intended to replace advice given to you by your health care provider. Make sure you discuss any questions you have with your health care provider. Document Released: 11/26/2015 Document Revised: 07/19/2016 Document Reviewed: 08/31/2015 Elsevier Interactive Patient Education  2017 [place] Prevention in the Home Falls can cause injuries. They can happen to people of all ages. There are many things you can do to make your home safe and to help prevent falls. What can I do on the outside of my home? Regularly fix the edges of walkways and driveways and fix any cracks. Remove anything that might make you trip as you walk through a door, such as a raised step or threshold. Trim any bushes or trees on the path to your home. Use bright outdoor lighting. Clear any walking paths of anything that might make someone trip, such as rocks or tools. Regularly check to see if handrails are loose or broken. Make sure that both sides of any steps have handrails. Any raised decks and porches should have guardrails on the edges. Have any leaves, snow, or ice cleared regularly. Use sand or salt on walking paths during winter. Clean up any spills in your garage right away. This includes oil or grease spills. What can I do in the bathroom? Use night lights. Install grab bars by the toilet and in the tub and shower. Do not use towel bars as grab bars. Use non-skid mats or decals in the tub or shower. If you need to sit down in the shower, use a plastic, non-slip stool. Keep the floor dry. Clean up any water that spills on the floor as soon as it happens. Remove soap buildup in the tub or shower regularly. Attach bath mats securely with double-sided non-slip rug tape. Do not have throw  rugs and other things on the floor that can make you trip. What can I do in the bedroom? Use night lights. Make sure that you have a light by your bed that is easy to reach. Do not use any sheets or blankets that are too big for your bed. They should not hang down onto the floor. Have a firm chair that has side arms. You can use this for support while you get dressed. Do not have throw rugs and other things on the floor that can make you trip. What can I do in the kitchen? Clean up any spills right away. Avoid walking on wet floors. Keep items that you use a lot in easy-to-reach places. If you need to reach something above you, use a strong step stool that has a grab bar. Keep electrical cords out of the way. Do not use floor polish or wax that makes floors slippery. If you must use wax, use non-skid floor wax. Do not have throw rugs and other things on the floor that can make you trip. What can I do with my stairs? Do not leave any items on the stairs. Make sure that there are handrails on both sides of the stairs and use them. Fix handrails that are broken or loose. Make sure that handrails are as long as the stairways. Check any carpeting to make sure that it is firmly attached to the stairs. Fix any carpet that  is loose or worn. Avoid having throw rugs at the top or bottom of the stairs. If you do have throw rugs, attach them to the floor with carpet tape. Make sure that you have a light switch at the top of the stairs and the bottom of the stairs. If you do not have them, ask someone to add them for you. What else can I do to help prevent falls? Wear shoes that: Do not have high heels. Have rubber bottoms. Are comfortable and fit you well. Are closed at the toe. Do not wear sandals. If you use a stepladder: Make sure that it is fully opened. Do not climb a closed stepladder. Make sure that both sides of the stepladder are locked into place. Ask someone to hold it for you, if  possible. Clearly mark and make sure that you can see: Any grab bars or handrails. First and last steps. Where the edge of each step is. Use tools that help you move around (mobility aids) if they are needed. These include: Canes. Walkers. Scooters. Crutches. Turn on the lights when you go into a dark area. Replace any light bulbs as soon as they burn out. Set up your furniture so you have a clear path. Avoid moving your furniture around. If any of your floors are uneven, fix them. If there are any pets around you, be aware of where they are. Review your medicines with your doctor. Some medicines can make you feel dizzy. This can increase your chance of falling. Ask your doctor what other things that you can do to help prevent falls. This information is not intended to replace advice given to you by your health care provider. Make sure you discuss any questions you have with your health care provider. Document Released: 08/26/2009 Document Revised: 04/06/2016 Document Reviewed: 12/04/2014 Elsevier Interactive Patient Education  2017 [employer].

## 2021-12-23 ENCOUNTER — Other Ambulatory Visit: Payer: Self-pay | Admitting: Physician Assistant

## 2021-12-23 ENCOUNTER — Ambulatory Visit: Payer: Medicare PPO | Admitting: Physician Assistant

## 2021-12-23 VITALS — BP 139/80 | HR 71 | Temp 98.2°F | Ht 65.0 in | Wt 140.5 lb

## 2021-12-23 DIAGNOSIS — R7303 Prediabetes: Secondary | ICD-10-CM | POA: Diagnosis not present

## 2021-12-23 DIAGNOSIS — E7849 Other hyperlipidemia: Secondary | ICD-10-CM

## 2021-12-23 DIAGNOSIS — N1832 Chronic kidney disease, stage 3b: Secondary | ICD-10-CM

## 2021-12-23 DIAGNOSIS — I10 Essential (primary) hypertension: Secondary | ICD-10-CM

## 2021-12-23 DIAGNOSIS — M25561 Pain in right knee: Secondary | ICD-10-CM

## 2021-12-23 DIAGNOSIS — G8929 Other chronic pain: Secondary | ICD-10-CM

## 2021-12-23 NOTE — Patient Instructions (Signed)
Good to see you today! Please go to the lab for blood work and I will send results through [facility]. I will forward your labs to your cardiologist as well. Keep up good work with your health!!   Call sooner if any concerns.

## 2021-12-23 NOTE — Progress Notes (Signed)
Subjective:    Patient ID: [name] [name], female    DOB: [birth year], 88 y.o.   MRN: [id number]  Chief Complaint  Patient presents with   Annual Exam   Ear Fullness    HPI Patient is in today with her daughter for regular f/up of her chronic conditions, blood work, and to have her ears checked as she is prone to cerumen impactions. She has been doing outpatient PT and also working with Dr. Denyse Amass for her chronic knee pain, otherwise no changes to her health since last visit with me.   Past Medical History:  Diagnosis Date   Anemia    Anxiety and depression 07/16/2009   ASD (atrial septal defect) per bubble study, 2004    Cardiomyopathy, normal cath 2007, normal EF per ECHO 2014    Hx of colonic polyps    Hypertension    Osteoarthritis    Osteopenia    Podagra 04/29/2014   RLS (restless legs syndrome) 07/10/2009    Past Surgical History:  Procedure Laterality Date   BACK SURGERY  2000   BREAST SURGERY     NEGATIVE BIOPSY   TONSILLECTOMY      Family History  Problem Relation Age of Onset   Heart attack Father    Heart disease Mother    Hypertension Other    Diabetes Neg Hx    Colon cancer Neg Hx     Social History   Tobacco Use   Smoking status: Never   Smokeless tobacco: Never  Substance Use Topics   Alcohol use: No    Alcohol/week: 0.0 standard drinks   Drug use: No     Allergies  Allergen Reactions   Shellfish Allergy Anaphylaxis   Codeine Other (See Comments)    Unknown.   Hydrocodone Other (See Comments)    Unknown.   Adhesive [Tape] Rash    Review of Systems NEGATIVE UNLESS OTHERWISE INDICATED IN HPI      Objective:     BP 139/80    Pulse 71    Temp 98.2 F (36.8 C)    Ht 5\' 5"  (1.651 m)    Wt 140 lb 8 oz (63.7 kg)    LMP  (LMP Unknown)    SpO2 100%    BMI 23.38 kg/m   Wt Readings from Last 3 Encounters:  12/23/21 140 lb 8 oz (63.7 kg)  12/22/21 143 lb 3.2 oz (65 kg)  12/22/21 142 lb 3.2 oz (64.5 kg)    BP Readings from Last 3  Encounters:  12/23/21 139/80  12/22/21 126/78  12/22/21 128/74     Physical Exam Vitals and nursing note reviewed.  Constitutional:      General: She is not in acute distress.    Appearance: Normal appearance. She is normal weight.  HENT:     Head: Normocephalic.     Right Ear: Tympanic membrane, ear canal and external ear normal.     Left Ear: Tympanic membrane, ear canal and external ear normal.     Nose: Nose normal.     Mouth/Throat:     Mouth: Mucous membranes are moist.  Eyes:     Extraocular Movements: Extraocular movements intact.     Conjunctiva/sclera: Conjunctivae normal.     Pupils: Pupils are equal, round, and reactive to light.  Cardiovascular:     Rate and Rhythm: Normal rate and regular rhythm.     Pulses: Normal pulses.     Heart sounds: No murmur heard. Pulmonary:  Effort: Pulmonary effort is normal.     Breath sounds: Normal breath sounds.  Abdominal:     Tenderness: There is no abdominal tenderness.  Musculoskeletal:        General: Normal range of motion.     Cervical back: Normal range of motion.  Skin:    General: Skin is warm.  Neurological:     General: No focal deficit present.     Mental Status: She is alert and oriented to person, place, and time.     Gait: Gait normal.  Psychiatric:        Mood and Affect: Mood normal.        Behavior: Behavior normal.       Assessment & Plan:   Problem List Items Addressed This Visit       Other   HLD (hyperlipidemia)   Prediabetes   Other Visit Diagnoses     Essential hypertension    -  Primary   Chronic kidney disease, stage 3b (HCC)       Chronic pain of right knee           1. Essential hypertension -BP to goal, doing well, takes Coreg and Entresto as directed. She has regular f/up with her cardiologist.   2. Prediabetes -Need to recheck Ha1c, she has been doing well with her diet  3. Other hyperlipidemia -Recheck lipid panel   4. Chronic kidney disease, stage 3b  (HCC) -Need to recheck CMP -Advised on medications to avoid including NSAIDs such as Aleve or Ibuprofen  5. Chronic pain of right knee -She will continue regular f/up with Dr. Denyse Amass about injections. If she fails injections, she may consider knee replacement, but advised her she would need clearance from her cardiologist first and need to weigh risks vs benefits.   Overall doing very well for 89 and encouraged her to keep up the great work! Ears were clear today. F/up in 6 months with me.    Wilhelmena Zea M Daemien Fronczak, PA-C

## 2021-12-27 ENCOUNTER — Other Ambulatory Visit: Payer: Self-pay

## 2021-12-27 ENCOUNTER — Other Ambulatory Visit: Payer: Medicare PPO | Admitting: *Deleted

## 2021-12-27 DIAGNOSIS — I420 Dilated cardiomyopathy: Secondary | ICD-10-CM

## 2021-12-27 LAB — BASIC METABOLIC PANEL
BUN/Creatinine Ratio: 21 (ref 12–28)
BUN: 31 mg/dL (ref 10–36)
CO2: 23 mmol/L (ref 20–29)
Calcium: 9.6 mg/dL (ref 8.7–10.3)
Chloride: 105 mmol/L (ref 96–106)
Creatinine, Ser: 1.45 mg/dL — ABNORMAL HIGH (ref 0.57–1.00)
Glucose: 77 mg/dL (ref 70–99)
Potassium: 4.3 mmol/L (ref 3.5–5.2)
Sodium: 139 mmol/L (ref 134–144)
eGFR: 34 mL/min/{1.73_m2} — ABNORMAL LOW (ref >59)

## 2021-12-30 ENCOUNTER — Ambulatory Visit: Payer: Medicare PPO | Admitting: Orthopaedic Surgery

## 2021-12-30 ENCOUNTER — Other Ambulatory Visit: Payer: Self-pay

## 2022-01-04 ENCOUNTER — Ambulatory Visit: Payer: Medicare PPO | Admitting: Orthopaedic Surgery

## 2022-01-04 ENCOUNTER — Encounter: Payer: Self-pay | Admitting: Orthopaedic Surgery

## 2022-01-04 ENCOUNTER — Other Ambulatory Visit: Payer: Self-pay

## 2022-01-04 DIAGNOSIS — M1711 Unilateral primary osteoarthritis, right knee: Secondary | ICD-10-CM | POA: Diagnosis not present

## 2022-01-04 NOTE — Progress Notes (Addendum)
Office Visit Note   Patient: [name] [name]           Date of Birth: June 16, 1927           MRN: [id number] Visit Date: 01/04/2022              Requested by: Rodolph Bong, MD [address] [place],  [place] [address] PCP: Bary Leriche, PA-C   Assessment & Plan: Visit Diagnoses:  1. Primary osteoarthritis of right knee     Plan: Impression is right knee osteoarthritis moderately severe.  Feel that her age she would have a lot of trouble rehabbing from a knee replacement so I strongly discouraged her from going down that road.  She and her daughter are both in agreement with this.  I do think a large portion of her symptoms is coming from the large joint effusion therefore we did an aspiration cortisone injection today.  90 cc of synovial fluid aspirated today.  If she wants to try Zilretta again she will contact Dr. Denyse Amass.  She may be interested in seeing Dr. Alvester Morin for nerve ablation as an alternative for improving knee pain.  Follow-Up Instructions: No follow-ups on file.   Orders:  No orders of the defined types were placed in this encounter.  No orders of the defined types were placed in this encounter.     Procedures: No procedures performed   Clinical Data: No additional findings.   Subjective: Chief Complaint  Patient presents with   Right Knee - Pain    HPI  [name] is a 88 year old female here for evaluation of chronic right knee pain and DJD.  She is a referral from Dr. Denyse Amass.  She has undergone multiple injections to her right knee including Visco, cortisone, Zilretta with temporary relief.  She wears hinged knee brace at times.  She lives with her daughter [name] [name] who is a patient of mine.  She describes aching chronic pain is worse at night.  Review of Systems  Constitutional: Negative.   HENT: Negative.    Eyes: Negative.   Respiratory: Negative.    Cardiovascular: Negative.   Endocrine: Negative.   Musculoskeletal:  Negative.   Neurological: Negative.   Hematological: Negative.   Psychiatric/Behavioral: Negative.    All other systems reviewed and are negative.   Objective: Vital Signs: LMP  (LMP Unknown)   Physical Exam Vitals and nursing note reviewed.  Constitutional:      Appearance: She is well-developed.  HENT:     Head: Normocephalic and atraumatic.  Pulmonary:     Effort: Pulmonary effort is normal.  Abdominal:     Palpations: Abdomen is soft.  Musculoskeletal:     Cervical back: Neck supple.  Skin:    General: Skin is warm.     Capillary Refill: Capillary refill takes less than 2 seconds.  Neurological:     Mental Status: She is alert and oriented to person, place, and time.  Psychiatric:        Behavior: Behavior normal.        Thought Content: Thought content normal.        Judgment: Judgment normal.    Ortho Exam  Examination right knee shows a large joint effusion.  Slight valgus alignment.  Collaterals and cruciates are stable.  Moderate pain with range of motion.  2+ patellofemoral crepitus with range of motion.  Specialty Comments:  No specialty comments available.  Imaging: No results found.   PMFS History: Patient Active  Problem List   Diagnosis Date Noted   Depression, recurrent (HCC) 02/20/2020   Prediabetes 01/26/2019   History of exercise intolerance 01/26/2019   HLD (hyperlipidemia) 04/07/2018   DJD (degenerative joint disease) 02/09/2010   PVCs (premature ventricular contractions) 07/20/2009   Anxiety and depression 07/16/2009   Cardiomyopathy (HCC) 04/23/2007   HTN (hypertension) 04/15/2007   Osteopenia 04/15/2007   Past Medical History:  Diagnosis Date   Anemia    Anxiety and depression 07/16/2009   ASD (atrial septal defect) per bubble study, 2004    Cardiomyopathy, normal cath 2007, normal EF per ECHO 2014    Hx of colonic polyps    Hypertension    Osteoarthritis    Osteopenia    Podagra 04/29/2014   RLS (restless legs syndrome)  07/10/2009    Family History  Problem Relation Age of Onset   Heart attack Father    Heart disease Mother    Hypertension Other    Diabetes Neg Hx    Colon cancer Neg Hx     Past Surgical History:  Procedure Laterality Date   BACK SURGERY  2000   BREAST SURGERY     NEGATIVE BIOPSY   TONSILLECTOMY     Social History   Occupational History   Occupation: Wellsite geologist: RETIRED  Tobacco Use   Smoking status: Never   Smokeless tobacco: Never  Substance and Sexual Activity   Alcohol use: No    Alcohol/week: 0.0 standard drinks   Drug use: No   Sexual activity: Not Currently

## 2022-01-12 ENCOUNTER — Other Ambulatory Visit: Payer: Self-pay

## 2022-01-12 ENCOUNTER — Telehealth: Payer: Self-pay

## 2022-01-12 ENCOUNTER — Encounter: Payer: Self-pay | Admitting: Orthopaedic Surgery

## 2022-01-12 DIAGNOSIS — M1711 Unilateral primary osteoarthritis, right knee: Secondary | ICD-10-CM

## 2022-01-12 NOTE — Telephone Encounter (Signed)
Referral sent to Dr. Alvester Morin  ?

## 2022-01-12 NOTE — Telephone Encounter (Signed)
Patient called into the office and would like a referral sent in to Dr. Alvester Morin to have injectons since the one with Roda Shutters has not helped.  ?

## 2022-01-17 ENCOUNTER — Encounter: Payer: Self-pay | Admitting: Physical Medicine and Rehabilitation

## 2022-01-17 ENCOUNTER — Other Ambulatory Visit: Payer: Self-pay

## 2022-01-17 ENCOUNTER — Ambulatory Visit: Payer: Medicare PPO | Admitting: Physical Medicine and Rehabilitation

## 2022-01-17 VITALS — BP 134/82 | HR 78

## 2022-01-17 DIAGNOSIS — M25461 Effusion, right knee: Secondary | ICD-10-CM | POA: Diagnosis not present

## 2022-01-17 DIAGNOSIS — M25561 Pain in right knee: Secondary | ICD-10-CM

## 2022-01-17 DIAGNOSIS — R269 Unspecified abnormalities of gait and mobility: Secondary | ICD-10-CM

## 2022-01-17 DIAGNOSIS — M1711 Unilateral primary osteoarthritis, right knee: Secondary | ICD-10-CM

## 2022-01-17 DIAGNOSIS — G8929 Other chronic pain: Secondary | ICD-10-CM

## 2022-01-17 NOTE — Progress Notes (Signed)
? ?[name] [name] - 88 y.o. female MRN [id number]  Date of birth: 11-Jan-1927 ? ?Office Visit Note: ?Visit Date: 01/17/2022 ?PCP: Allwardt, Crist Infante, PA-C ?Referred by: Allwardt, Crist Infante, PA-C ? ?Subjective: ?Chief Complaint  ?Patient presents with  ? Right Knee - Pain  ? ?HPI: [name] [name] is a 88 y.o. female who comes in today at the request of Dr. Glee Arvin for evaluation of chronic, worsening and severe right sided knee pain. Daughter is accompanying her during our visit today. Patient reports pain has been ongoing for several months and states pain is exacerbated by walking and standing, currently rates pain as 8 out of 10. Patient reports some relief of pain with knee brace, ice/heat and use of medications. Patient has attended formal physical therapy at [facility] and reports short term relief of pain. Patients right knee x-ray images from 2022 exhibits mild tricompartmental osteoarthritis with chondrocalcinosis. Patient has recently been treated by Dr. [name] at [facility] where she had multiple right knee injections including Zilretta and Durolane with minimal relief of pain. Patient was also recently seen by Dr. Glee Arvin whom performed cortisone injection to right knee and aspirated 90 ml of synovial fluid. Patient states 1 week of pain relief with this injection, however pain and swelling have returned. Per Dr. Roda Shutters patient is not a surgical candidate due to advanced age. Patient is currently using cane to assist with ambulation and prevent falls. Patient denies focal weakness, numbness and tingling. Patient denies recent trauma or falls.  ? ?Review of Systems  ?Musculoskeletal:  Positive for joint pain.  ?Neurological:  Negative for tingling, sensory change, focal weakness and weakness.  ?All other systems reviewed and are negative. Otherwise per HPI. ? ?Assessment & Plan: ?Visit Diagnoses:  ?  ICD-10-CM   ?1. Chronic pain of right knee  M25.561  Ambulatory referral to Physical Medicine Rehab  ? G89.29   ?  ?2. Effusion, right knee  M25.461 Ambulatory referral to Physical Medicine Rehab  ?  ?3. Unilateral primary osteoarthritis, right knee  M17.11 Ambulatory referral to Physical Medicine Rehab  ?  ?4. Gait abnormality  R26.9   ?  ?   ?Plan: Findings:  ?Chronic, worsening and severe right sided knee pain. Patient continues to have excruciating and debilitating pain despite good conservative therapies such as formal physical therapy, knee brace, ice/heat and medications. Patients clinical presentation and exam are consistent with osteoarthritis of right knee. She does have moderate effusion noted to right knee today. We feel the next step is to perform diagnostic and hopefully therapeutic right genicular nerve block under fluoroscopic guidance. If patient gets good pain relief with diagnostic genicular nerve blocks we did discuss the possibility of longer sustained pain relief with radiofrequency ablation. I did discuss radiofrequency ablation procedure in detail with patient and daughter today, I also provided them with education material on radiofrequency ablation to take home and review. Patient encouraged to continue using cane to assist with ambulation and prevent falls. No red flag symptoms noted upon exam today.   ? ?Meds & Orders: No orders of the defined types were placed in this encounter. ?  ?Orders Placed This Encounter  ?Procedures  ? Ambulatory referral to Physical Medicine Rehab  ?  ?Follow-up: Return for Right genicular nerve block.  ? ?Procedures: ?No procedures performed  ?   ? ?Clinical History: ?EXAM: ?RIGHT KNEE 3 VIEWS ?  ?COMPARISON:  None. ?  ?FINDINGS: ?Mild diffuse joint space narrowing.  Mild tricompartmental peripheral ?spurring. No acute or healing fracture. Patella normally situated in ?the trochlear groove. Minimal spurring of the tibial spines. There ?is faint medial and lateral chondrocalcinosis. Small quadriceps ?tendon  enthesophyte. Mild subchondral cystic change in the patella. ?No significant joint effusion. ?  ?IMPRESSION: ?Mild tricompartmental osteoarthritis with chondrocalcinosis. ?  ?  ?Electronically Signed ?  By: Narda Rutherford M.D. ?  On: 04/06/2021 22:24  ? ?She reports that she has never smoked. She has never used smokeless tobacco. No results for input(s): HGBA1C, LABURIC in the last 8760 hours. ? ?Objective:  VS:  HT:    WT:   BMI:     BP:134/82  HR:78bpm  TEMP: ( )  RESP:  ?Physical Exam ?Vitals and nursing note reviewed.  ?HENT:  ?   Head: Normocephalic and atraumatic.  ?   Right Ear: External ear normal.  ?   Left Ear: External ear normal.  ?   Nose: Nose normal.  ?   Mouth/Throat:  ?   Mouth: Mucous membranes are moist.  ?Eyes:  ?   Extraocular Movements: Extraocular movements intact.  ?Cardiovascular:  ?   Rate and Rhythm: Normal rate.  ?   Pulses: Normal pulses.  ?Pulmonary:  ?   Effort: Pulmonary effort is normal.  ?Abdominal:  ?   General: Abdomen is flat. There is no distension.  ?Musculoskeletal:     ?   General: Tenderness present.  ?   Cervical back: Normal range of motion.  ?   Comments: Moderate joint effusion noted, pain and crepitus noted with range of motion. Ambulates with cane, gait unsteady.   ?Skin: ?   General: Skin is warm and dry.  ?   Capillary Refill: Capillary refill takes less than 2 seconds.  ?Neurological:  ?   Mental Status: She is alert and oriented to person, place, and time.  ?   Gait: Gait abnormal.  ?Psychiatric:     ?   Mood and Affect: Mood normal.     ?   Behavior: Behavior normal.  ?  ?Ortho Exam ? ?Imaging: ?No results found. ? ?Past Medical/Family/Surgical/Social History: ?Medications & Allergies reviewed per EMR, new medications updated. ?Patient Active Problem List  ? Diagnosis Date Noted  ? Depression, recurrent (HCC) 02/20/2020  ? Prediabetes 01/26/2019  ? History of exercise intolerance 01/26/2019  ? HLD (hyperlipidemia) 04/07/2018  ? DJD (degenerative joint  disease) 02/09/2010  ? PVCs (premature ventricular contractions) 07/20/2009  ? Anxiety and depression 07/16/2009  ? Cardiomyopathy (HCC) 04/23/2007  ? HTN (hypertension) 04/15/2007  ? Osteopenia 04/15/2007  ? ?Past Medical History:  ?Diagnosis Date  ? Anemia   ? Anxiety and depression 07/16/2009  ? ASD (atrial septal defect) per bubble study, 2004   ? Cardiomyopathy, normal cath 2007, normal EF per ECHO 2014   ? Hx of colonic polyps   ? Hypertension   ? Osteoarthritis   ? Osteopenia   ? Podagra 04/29/2014  ? RLS (restless legs syndrome) 07/10/2009  ? ?Family History  ?Problem Relation Age of Onset  ? Heart attack Father   ? Heart disease Mother   ? Hypertension Other   ? Diabetes Neg Hx   ? Colon cancer Neg Hx   ? ?Past Surgical History:  ?Procedure Laterality Date  ? BACK SURGERY  2000  ? BREAST SURGERY    ? NEGATIVE BIOPSY  ? TONSILLECTOMY    ? ?Social History  ? ?Occupational History  ? Occupation: Teacher, music  ?  Employer: RETIRED  ?  Tobacco Use  ? Smoking status: Never  ? Smokeless tobacco: Never  ?Substance and Sexual Activity  ? Alcohol use: No  ?  Alcohol/week: 0.0 standard drinks  ? Drug use: No  ? Sexual activity: Not Currently  ? ? ?

## 2022-01-17 NOTE — Progress Notes (Signed)
Pt state she has right knee pain. Pt state walking, standing and sitting makes the pain worse. Pt state she uses ice and over the counter pain meds to help ease her pain. ? ?Numeric Pain Rating Scale and Functional Assessment ?Average Pain 10 ?Pain Right Now 5 ?My pain is constant, burning, stabbing, and aching ?Pain is worse with: walking, sitting, standing, and some activites ?Pain improves with: heat/ice and medication ? ? ?In the last MONTH (on 0-10 scale) has pain interfered with the following? ? ?1. General activity like being  able to carry out your everyday physical activities such as walking, climbing stairs, carrying groceries, or moving a chair?  ?Rating(6) ? ?2. Relation with others like being able to carry out your usual social activities and roles such as  activities at home, at work and in your community. ?Rating(7) ? ?3. Enjoyment of life such that you have  been bothered by emotional problems such as feeling anxious, depressed or irritable?  ?Rating(7) ? ?

## 2022-01-20 ENCOUNTER — Telehealth: Payer: Self-pay | Admitting: Family Medicine

## 2022-01-20 NOTE — Telephone Encounter (Signed)
I wish that draining it and injecting it would help but I do not think is going to last more than just a few days.  Draining it and not doing a cortisone shot will last a day.  I will fill right back up again.  Every time we put a needle in your knee we are increasing her risk of developing an infection which would be really bad. ? ?I think your best bet is to proceed with the ablation.  I wish I could do something to help you more. ?

## 2022-01-20 NOTE — Telephone Encounter (Signed)
Pt saw Dr. Roda Shutters 2/22, drained R knee and given cortisone. Felt better for a few days. ?PT scheduled to start ablation process with Dr. Alvester Morin on 3/23. ? ?PT daughter would like to know if Dr. Denyse Amass could drain the knee again (soon, as it is swollen again ) while they are awaiting the ablation appt. ?

## 2022-01-23 NOTE — Telephone Encounter (Signed)
Spoke to pt's daughter and relayed Dr. Zollie Pee advise. Daughter verbalized understanding. ? ?

## 2022-02-02 ENCOUNTER — Other Ambulatory Visit: Payer: Self-pay

## 2022-02-02 ENCOUNTER — Ambulatory Visit: Payer: Self-pay

## 2022-02-02 ENCOUNTER — Ambulatory Visit (INDEPENDENT_AMBULATORY_CARE_PROVIDER_SITE_OTHER): Payer: Medicare PPO | Admitting: Physical Medicine and Rehabilitation

## 2022-02-02 ENCOUNTER — Encounter: Payer: Self-pay | Admitting: Physical Medicine and Rehabilitation

## 2022-02-02 VITALS — BP 142/62

## 2022-02-02 DIAGNOSIS — G8929 Other chronic pain: Secondary | ICD-10-CM | POA: Diagnosis not present

## 2022-02-02 DIAGNOSIS — M25561 Pain in right knee: Secondary | ICD-10-CM | POA: Diagnosis not present

## 2022-02-02 NOTE — Progress Notes (Signed)
Pt state she has right knee pain. Pt state walking, standing and sitting makes the pain worse. Pt state she uses ice and over the counter pain meds to help ease her pain. ? ?Numeric Pain Rating Scale and Functional Assessment ?Average Pain 9 ? ? ?In the last MONTH (on 0-10 scale) has pain interfered with the following? ? ?1. General activity like being  able to carry out your everyday physical activities such as walking, climbing stairs, carrying groceries, or moving a chair?  ?Rating(10) ? ? ?+Driver, -BT, -Dye Allergies. ? ?

## 2022-02-06 ENCOUNTER — Other Ambulatory Visit: Payer: Self-pay

## 2022-02-06 ENCOUNTER — Telehealth: Payer: Self-pay | Admitting: Physical Medicine and Rehabilitation

## 2022-02-06 ENCOUNTER — Encounter: Payer: Self-pay | Admitting: Family Medicine

## 2022-02-06 ENCOUNTER — Ambulatory Visit: Payer: Medicare PPO | Admitting: Family Medicine

## 2022-02-06 ENCOUNTER — Ambulatory Visit: Payer: Self-pay

## 2022-02-06 VITALS — BP 146/80 | HR 79 | Ht 65.0 in | Wt 143.2 lb

## 2022-02-06 DIAGNOSIS — M1711 Unilateral primary osteoarthritis, right knee: Secondary | ICD-10-CM | POA: Diagnosis not present

## 2022-02-06 DIAGNOSIS — G8929 Other chronic pain: Secondary | ICD-10-CM

## 2022-02-06 DIAGNOSIS — M25561 Pain in right knee: Secondary | ICD-10-CM

## 2022-02-06 NOTE — Telephone Encounter (Signed)
Spoke with patient and daughter this morning regarding right knee genicular block, patient reports minimal relief of pain following procedure, 40% on pain dairy. Patient instructed to follow up with Dr. Roda Shutters. ?

## 2022-02-06 NOTE — Progress Notes (Signed)
? ?I, [name] [name], LAT, ATC, am serving as scribe for Dr. [name]. ? ?[name] is a 88 y.o. female who presents to [facility] today for continued chronic R knee pain due to DJD . Pt had a Zilretta R knee injection on 10/26/21 and has completed 7 total PT visits. She was previously seen on 08/19/21 and had a Duralone injection in her R knee and then had a R knee aspiration/injection on 08/29/21.  Pt was last seen by Dr. Denyse Amass on 12/22/21 and was advised do consult w/ PMNR for possible genicular nerve ablation and orthopedic surgery for possible TKR. Pt was seen by Dr. Roda Shutters on 01/04/22 and 90 mL was aspirated from her R knee and was advised to avoid surgery due to advanced age. Pt was seen by Dr. Alvester Morin on 01/17/22 and was advised to return for genicular nerve block that was performed on 02/02/22. Today, pt reports that her R knee feels no better. ? ?She had the nerve block in preparation for the ablation but notes that it only improved her pain by about 40%.  She has a follow-up appointment scheduled with her orthopedic surgeon Dr. Roda Shutters on 3/31.  ? ?Dx imaging: 04/05/21 R knee XR ? ?Pertinent review of systems: No fevers or chills ? ?Relevant historical information: Hypertension ? ? ?Exam:  ?BP (!) 146/80 (BP Location: Right Arm, Patient Position: Sitting, Cuff Size: Normal)   Pulse 79   Ht 5\' 5"  (1.651 m)   Wt 143 lb 3.2 oz (65 kg)   LMP  (LMP Unknown)   SpO2 94%   BMI 23.83 kg/m?  ?General: Well Developed, well nourished, and in no acute distress.  ? ?MSK: Right knee: Moderate effusion.  Significant genu valgus.  Tender palpation medial joint line. ?Mild antalgic gait. ? ? ? ?Lab and Radiology Results ? ?Procedure: Real-time Ultrasound Guided aspiration and injection of right knee ?Device: Philips Affiniti 50G ?Images permanently stored and available for review in PACS ?Verbal informed consent obtained.  Discussed risks and benefits of procedure. Warned about infection  bleeding damage to structures skin hypopigmentation and fat atrophy among others. ?Patient expresses understanding and agreement ?Time-out conducted.   ?Noted no overlying erythema, induration, or other signs of local infection.   ?Skin prepped in a sterile fashion.   ?Local anesthesia: Topical Ethyl chloride.   ?With sterile technique and under real time ultrasound guidance: 3 milliliters of lidocaine injected into right knee superior lateral space achieving good anesthesia along plan aspiration tract. ?Skin was again sterilized with isopropyl alcohol. ?18-gauge needle was used to access the superior patellar space ?40 mL of clear yellow fluid was aspirated ?Syringe was exchanged and 32 mg of Zilretta injected into the knee joint ?Completed without difficulty   ?Advised to call if fevers/chills, erythema, induration, drainage, or persistent bleeding.   ?Images permanently stored and available for review in the ultrasound unit.  ?Impression: Technically successful ultrasound guided injection. ? ? ? ? ?Assessment and Plan: ?88 y.o. female with end-stage knee DJD with significant chronic pain.  Unfortunately 99 is exhausted her conservative management options.  I do not think she has any further conservative management options at this point.  Zilretta injection today I think will only be somewhat temporary probably for maybe 2 months.  ?She did not get sufficient benefit with genicular nerve block to proceed with an ablation. ?I do not think she has any more long-term solutions.  She has a follow-up appointment scheduled with Dr.  Xu discuss potential knee replacement on March 31.  Although this is a high risk procedure for her age I do not think there is any other options for her. ? ? ? ? ?PDMP not reviewed this encounter. ?Orders Placed This Encounter  ?Procedures  ? [place] LIMITED JOINT SPACE STRUCTURES LOW RIGHT(NO LINKED CHARGES)  ?  Order Specific Question:   Reason for Exam (SYMPTOM  OR DIAGNOSIS REQUIRED)  ?   Answer:   R knee pain  ?  Order Specific Question:   Preferred imaging location?  ?  Answer:   [facility]  ? ?No orders of the defined types were placed in this encounter. ? ? ? ?Discussed warning signs or symptoms. Please see discharge instructions. Patient expresses understanding. ? ? ?The above documentation has been reviewed and is accurate and complete [name], M.D. ? ? ?

## 2022-02-06 NOTE — Patient Instructions (Addendum)
Good to see you today. ? ?You had a R knee aspiration and injection w/ Zilretta.  Call or go to the ER if you develop a large red swollen joint with extreme pain or oozing puss.  ? ?Follow-up as needed.   ?

## 2022-02-10 ENCOUNTER — Ambulatory Visit: Payer: Medicare PPO | Admitting: Orthopaedic Surgery

## 2022-02-10 DIAGNOSIS — M1711 Unilateral primary osteoarthritis, right knee: Secondary | ICD-10-CM | POA: Diagnosis not present

## 2022-02-10 NOTE — Progress Notes (Signed)
? ?Office Visit Note ?  ?Patient: [name] [name]           ?Date of Birth: 1927-08-20           ?MRN: [id number] ?Visit Date: 02/10/2022 ?             ?Requested by: Allwardt, Crist Infante, PA-C ?[address] ?[place],  [place] [address] ?PCP: Allwardt, Crist Infante, PA-C ? ? ?Assessment & Plan: ?Visit Diagnoses:  ?1. Primary osteoarthritis of right knee   ? ? ?Plan: [name] returns today with her daughter [name] for further discussion on knee pain.  She saw Dr. Alvester Morin and they decided that she is not a candidate for nerve ablation.  She did undergo Zilretta injection about a week ago at Dr. Zollie Pee office.  She has experienced significant relief and improvement since this injection.  She is able to bend her knee and able to walk without a cane.  She feels that it is a lot more functional. ? ?Examination of the right knee shows good painless range of motion.  Functional range of motion and acceptable gait. ? ?We had a long discussion about treatment options both surgical and nonsurgical and I think that at her age I be worried that she would have trouble recovering from a knee replacement and the fact that she states that she gets about 2-1/2 months of relief from Zilretta injections I think this may be the way to go.  She feels that with the Zilretta she is functional and the pain is acceptable and something that she can live with.  I explained that if they wanted to look at having a knee replacement that we will get preoperative and perioperative risk assessment from her medical providers but for now we will put that on the back burner since Zilretta is still effective.  Follow-up as needed. ? ?Follow-Up Instructions: No follow-ups on file.  ? ?Orders:  ?No orders of the defined types were placed in this encounter. ? ?No orders of the defined types were placed in this encounter. ? ? ? ? Procedures: ?No procedures performed ? ? ?Clinical Data: ?No additional findings. ? ? ?Subjective: ?Chief Complaint   ?Patient presents with  ? Right Knee - Pain  ? ? ?HPI ? ?Review of Systems ? ? ?Objective: ?Vital Signs: LMP  (LMP Unknown)  ? ?Physical Exam ? ?Ortho Exam ? ?Specialty Comments:  ?No specialty comments available. ? ?Imaging: ?No results found. ? ? ?PMFS History: ?Patient Active Problem List  ? Diagnosis Date Noted  ? Depression, recurrent (HCC) 02/20/2020  ? Prediabetes 01/26/2019  ? History of exercise intolerance 01/26/2019  ? HLD (hyperlipidemia) 04/07/2018  ? DJD (degenerative joint disease) 02/09/2010  ? PVCs (premature ventricular contractions) 07/20/2009  ? Anxiety and depression 07/16/2009  ? Cardiomyopathy (HCC) 04/23/2007  ? HTN (hypertension) 04/15/2007  ? Osteopenia 04/15/2007  ? ?Past Medical History:  ?Diagnosis Date  ? Anemia   ? Anxiety and depression 07/16/2009  ? ASD (atrial septal defect) per bubble study, 2004   ? Cardiomyopathy, normal cath 2007, normal EF per ECHO 2014   ? Hx of colonic polyps   ? Hypertension   ? Osteoarthritis   ? Osteopenia   ? Podagra 04/29/2014  ? RLS (restless legs syndrome) 07/10/2009  ?  ?Family History  ?Problem Relation Age of Onset  ? Heart attack Father   ? Heart disease Mother   ? Hypertension Other   ? Diabetes Neg Hx   ? Colon cancer Neg  Hx   ?  ?Past Surgical History:  ?Procedure Laterality Date  ? BACK SURGERY  2000  ? BREAST SURGERY    ? NEGATIVE BIOPSY  ? TONSILLECTOMY    ? ?Social History  ? ?Occupational History  ? Occupation: Teacher, music  ?  Employer: RETIRED  ?Tobacco Use  ? Smoking status: Never  ? Smokeless tobacco: Never  ?Substance and Sexual Activity  ? Alcohol use: No  ?  Alcohol/week: 0.0 standard drinks  ? Drug use: No  ? Sexual activity: Not Currently  ? ? ? ? ? ? ?

## 2022-02-12 MED ORDER — BUPIVACAINE HCL 0.5 % IJ SOLN
5.0000 mL | INTRAMUSCULAR | Status: AC | PRN
  Administered 2022-02-12: 5 mL via INTRA_ARTICULAR

## 2022-02-12 NOTE — Progress Notes (Signed)
? ?[name] - 88 y.o. female MRN [id number]  Date of birth: 07/23/1927 ? ?Office Visit Note: ?Visit Date: 02/02/2022 ?PCP: Allwardt, Randa Evens, PA-C ?Referred by: Allwardt, Randa Evens, PA-C ? ?Subjective: ?Chief Complaint  ?Patient presents with  ? Right Knee - Pain  ? ?HPI:  [name] [name] is a 88 y.o. female who comes in today at the request of Dr. Eduard Roux and Dr. Lynne Leader for planned Right  Genicular nerve block block with fluoroscopic guidance.  The patient has failed conservative care including injections, home exercise, medications, time and activity modification.  Nerve block will be performed in a double block paradigm with goal towards radiofrequency ablation of the same genicular nerves for longer term definitive care.  Please see requesting physician notes for further details and justification.   ? ?ROS Otherwise per HPI. ? ?Assessment & Plan: ?Visit Diagnoses:  ?  ICD-10-CM   ?1. Chronic pain of right knee  M25.561 XR C-ARM NO REPORT  ? G89.29   ?  ?  ?Plan: No additional findings.  ? ?Meds & Orders: No orders of the defined types were placed in this encounter. ?  ?Orders Placed This Encounter  ?Procedures  ? Large Joint Inj  ? XR C-ARM NO REPORT  ?  ?Follow-up: Return for Review Pain Diary.  ? ?Procedures: ?Large Joint Inj: R knee on 02/12/2022 12:06 PM ?Indications: pain and diagnostic evaluation ?Details: 25 G 3.5 in needle, fluoroscopy-guided anterior approach ?Medications: 5 mL bupivacaine 0.5 % ?Outcome: tolerated well, no immediate complications ? ?Genicular Nerve Blockade with Fluoroscopic Guidance ? ?Patient: [name] [name]      ?Date of Birth: [birth year] ?MRN: [id number] ?PCP: Allwardt, Randa Evens, PA-C      ? ?  ?Universal Protocol:    ?Date/Time: 02/13/2311:06 PM ? ?Consent Given By: the patient ? ?Position: SUPINE ? ?Additional Comments: ?Vital signs were monitored before and after the procedure. ?Patient was prepped and draped in the usual sterile fashion. ?The correct  patient, procedure, and site was verified. ? ? ?Injection Procedure Details:  ?Procedure Site One ?Meds Administered: No orders of the defined types were placed in this encounter. ?  ? ?Laterality: Right ? ?Location/Site:  ?Superior medial, superior lateral and inferior medial genicular nerve ? ?Needle size: 3.5 inch 25-gauge spinal needle ? ?Needle Placement: Just posterior to midline on the lateral view at the locations noted in the procedure note. ? ? ?Findings: ?  ? -Comments: Excellent position of the needle tip using biplanar imaging with low contrast showing no vascular flow. ? ?Procedure Details: ?The fluoroscope was positioned in a 90 degree anteroposterior (AP) fluoroscopic view and then tilted to obtain a true AP view of the knee joint.  The target areas are the mid-point of the curve formed where the femoral shaft and epicondyles meet as well as the midpoint of the curve on the medial side where the tibial shaft meets the plateau.  Using fluoroscopic guidance the target areas were infiltrated using a 27-gauge needle and 1-2 mL of 1% lidocaine without epinephrine to provide local anesthetic.  Then, for each target area a 3-1/2 inch 25-gauge spinal needle was driven down under intermittent fluoroscopic guidance to the periosteum of the target site.  Once all 3 needles were in place in the AP view, a lateral view was obtained at each needle was driven control to the midpoint of the shafts respectively.  A spot film was obtained of the lateral view and saved.  Next, an AP view  was obtained once again to confirm placement.  A spot film then was obtained and saved. ? ?After fluoroscopic imaging was used to determine and confirm placement, 0.5-1 mL of injectate was delivered at each target location.  The patient was given a pain diary to complete and return to the office.  ? ? ? ? ?Additional Comments:  ?The patient tolerated the procedure well ?Dressing: Band-Aid ?  ? ?Post-procedure details: ?Patient was  observed during the procedure. ?Post-procedure instructions were reviewed. ? ?Patient left the clinic in stable condition. ? ? ? ? ? ? ?  ?Consent was given by the patient. Immediately prior to procedure a time out was called to verify the correct patient, procedure, equipment, support staff and site/side marked as required. Patient was prepped and draped in the usual sterile fashion.  ? ?  ?   ? ?Clinical History: ?No specialty comments available.  ? ? ? ?Objective:  VS:  HT:    WT:   BMI:     BP:(!) 142/62  HR: bpm  TEMP: ( )  RESP:  ?Physical Exam  ? ?Imaging: ?No results found. ?

## 2022-02-16 ENCOUNTER — Telehealth: Payer: Self-pay | Admitting: Family Medicine

## 2022-02-16 NOTE — Telephone Encounter (Signed)
Spoke to pt's daughter and advised to exercise as tolerated. If the activity causes increase knee pain, that means she over did it. OK to ice if needed esp after activity. Advised that the cane is helpful in prevent falls and should be used esp, when leaving the house. Vikki Ports verbalized understanding. ?

## 2022-02-16 NOTE — Telephone Encounter (Signed)
Pt daughter called with questions, please call daughter, Mateo Flow at [phone] ? ?R knee is much better, she can bend it and no swelling ? ?How much exercising can she do? Stationary bike? Exercises from PT? ? ?Does she need to use her cane or continue icing? ?

## 2022-02-21 ENCOUNTER — Other Ambulatory Visit: Payer: Self-pay | Admitting: Cardiovascular Disease

## 2022-03-02 ENCOUNTER — Other Ambulatory Visit: Payer: Self-pay | Admitting: Cardiovascular Disease

## 2022-03-08 ENCOUNTER — Ambulatory Visit: Payer: Medicare PPO | Admitting: Family

## 2022-03-09 ENCOUNTER — Encounter: Payer: Self-pay | Admitting: Family

## 2022-03-09 ENCOUNTER — Ambulatory Visit: Payer: Medicare PPO | Admitting: Family

## 2022-03-09 VITALS — BP 175/90 | HR 72 | Temp 97.2°F | Ht 65.0 in | Wt 142.1 lb

## 2022-03-09 DIAGNOSIS — I10 Essential (primary) hypertension: Secondary | ICD-10-CM | POA: Diagnosis not present

## 2022-03-09 DIAGNOSIS — M79675 Pain in left toe(s): Secondary | ICD-10-CM | POA: Diagnosis not present

## 2022-03-09 NOTE — Progress Notes (Signed)
? ?Subjective:  ? ? ? Patient ID: [name] [name], female    DOB: 1927/09/16, 88 y.o.   MRN: [id number] ? ?Chief Complaint  ?Patient presents with  ? Toe Pain  ?  Pt c/o toe pain on left big toe. Pt states she had a pedicure last week and it has been hurting since. Has tried soaking it in epsom salt and it did not help.   ? ?HPI: ?Toe pain:  left great toe lateral nailbed. Reports swelling and tenderness. Received a pedicure 2 weeks ago, and the toe started hurting about 1 week ago.  Patient reports soaking in Epsom salt water daily but with no improvement in symptoms. ? ? ?Assessment & Plan:  ? ?Problem List Items Addressed This Visit   ?None ?Visit Diagnoses   ? ? Elevated blood pressure reading with diagnosis of hypertension    -  Primary ?Blood pressure rechecked at end of visit down to 172/82, patient denies any symptoms.  Reports having a BP cuff at home, advised pt and dtr (also present) to check later when she gets home after resting, and then again after she takes her second dose this medicine, and if still elevated let us know. Goal should be around 140/80. ? ?  ? Great toe pain, left    ?Advised pt and dtr to continue warm water Epsom salt soaks 2 to 3 times a day, dry the toe well after, and then apply thin layer of Bactroban ointment (have at home) to the nailbed, pulling skin gently away from nail to apply with a Qtip, several provided in the office. Call back if worsening swelling, redness, or tenderness.   ? ?  ? ?Outpatient Medications Prior to Visit  ?Medication Sig Dispense Refill  ? buPROPion (WELLBUTRIN XL) 150 MG 24 hr tablet Take 450 mg by mouth every morning.     ? carvedilol (COREG) 25 MG tablet TAKE 1 TABLET BY MOUTH 2 TIMES DAILY WITH A MEAL. 180 tablet 0  ? diclofenac Sodium (VOLTAREN) 1 % GEL Apply 4 g topically 4 (four) times daily. To affected joint. 100 g 11  ? furosemide (LASIX) 20 MG tablet TAKE 1 TABLET BY MOUTH EVERY OTHER DAY 15 tablet 8  ? mirtazapine (REMERON) 30 MG tablet  Take 30 mg by mouth at bedtime.     ? Multiple Vitamin (MULTIVITAMIN) tablet Take 1 tablet by mouth every morning.    ? Multiple Vitamins-Minerals (HAIR SKIN AND NAILS FORMULA PO) Take 2 tablets by mouth daily.    ? NON FORMULARY Take 3 tablets by mouth daily. Curumin for joint pain    ? polyethylene glycol (MIRALAX / GLYCOLAX) packet Take 17 g by mouth 3 (three) times a week. Mon, wed, and fri    ? sacubitril-valsartan (ENTRESTO) 97-103 MG Take 1 tablet by mouth 2 (two) times daily. 60 tablet 11  ? spironolactone (ALDACTONE) 25 MG tablet TAKE 1 TABLET BY MOUTH EVERY OTHER DAY 15 tablet 6  ? VYZULTA 0.024 % SOLN SMARTSIG:1 Drop(s) In Eye(s) Every Evening    ? ?No facility-administered medications prior to visit.  ? ? ?Past Medical History:  ?Diagnosis Date  ? Anemia   ? Anxiety and depression 07/16/2009  ? ASD (atrial septal defect) per bubble study, 2004   ? Cardiomyopathy, normal cath 2007, normal EF per ECHO 2014   ? Hx of colonic polyps   ? Hypertension   ? Osteoarthritis   ? Osteopenia   ? Podagra 04/29/2014  ? RLS (restless legs  syndrome) 07/10/2009  ? ? ?Past Surgical History:  ?Procedure Laterality Date  ? BACK SURGERY  2000  ? BREAST SURGERY    ? NEGATIVE BIOPSY  ? TONSILLECTOMY    ? ? ?Allergies  ?Allergen Reactions  ? Shellfish Allergy Anaphylaxis  ? Codeine Other (See Comments)  ?  Unknown.  ? Hydrocodone Other (See Comments)  ?  Unknown.  ? Adhesive [Tape] Rash  ? ? ?   ?Objective:  ?  ?Physical Exam ?Vitals and nursing note reviewed.  ?Constitutional:   ?   Appearance: Normal appearance.  ?Cardiovascular:  ?   Rate and Rhythm: Normal rate and regular rhythm.  ?Pulmonary:  ?   Effort: Pulmonary effort is normal.  ?   Breath sounds: Normal breath sounds.  ?Musculoskeletal:     ?   General: Normal range of motion.  ?Skin: ?   General: Skin is warm and dry.  ?   Findings: Erythema (mild, with mild swelling on lateral left great toe nailbed) present.  ?Neurological:  ?   Mental Status: She is alert.   ?Psychiatric:     ?   Mood and Affect: Mood normal.     ?   Behavior: Behavior normal.  ? ? ?BP (!) 175/90 (BP Location: Left Arm, Patient Position: Sitting, Cuff Size: Large)   Pulse 72   Temp (!) 97.2 ?F (36.2 ?C) (Temporal)   Ht 5\' 5"  (1.651 m)   Wt 142 lb 2 oz (64.5 kg)   LMP  (LMP Unknown)   SpO2 90%   BMI 23.65 kg/m?  ?Wt Readings from Last 3 Encounters:  ?03/09/22 142 lb 2 oz (64.5 kg)  ?02/06/22 143 lb 3.2 oz (65 kg)  ?12/23/21 140 lb 8 oz (63.7 kg)  ? ?   ? ?Jeanie Sewer, NP ? ?

## 2022-03-09 NOTE — Patient Instructions (Addendum)
It was very nice to see you today! ? ?Continue to soak your toe in warm epsom salt water for 15-20 minutes several times per day.  ?Dry the toe well, then apply a thin layer of the Mupirocin antibiotic ointment you have at home, with a Q-tip, after trying to pull the skin away from the nail slightly as I showed you during the visit. ?Call the office if the toe becomes more swollen, tender, red, or draining pus.  ? ?Monitor your blood pressure at home, your goal is around 140/80-90. Be sure you are drinking up to 2 liters of water daily (unless your cardiologist said to drink less) and eat a low sodium diet. ? ? ? ?PLEASE NOTE: ? ?If you had any lab tests please let us know if you have not heard back within a few days. You may see your results on MyChart before we have a chance to review them but we will give you a call once they are reviewed by [place]. If we ordered any referrals today, please let us know if you have not heard from their office within the next week.  ? ?Please try these tips to maintain a healthy lifestyle: ? ?Eat most of your calories during the day when you are active. Eliminate processed foods including packaged sweets (pies, cakes, cookies), reduce intake of potatoes, white bread, white pasta, and white rice. Look for whole grain options, oat flour or almond flour. ? ?Each meal should contain half fruits/vegetables, one quarter protein, and one quarter carbs (no bigger than a computer mouse). ? ?Cut down on sweet beverages. This includes juice, soda, and sweet tea. Also watch fruit intake, though this is a healthier sweet option, it still contains natural sugar! Limit to 3 servings daily. ? ?Drink at least 1 glass of water with each meal and aim for at least 8 glasses per day ? ?Exercise at least 150 minutes every week.  ? ?

## 2022-03-17 ENCOUNTER — Telehealth: Payer: Self-pay

## 2022-03-17 ENCOUNTER — Other Ambulatory Visit: Payer: Self-pay | Admitting: Family

## 2022-03-17 DIAGNOSIS — L03032 Cellulitis of left toe: Secondary | ICD-10-CM

## 2022-03-17 DIAGNOSIS — M79675 Pain in left toe(s): Secondary | ICD-10-CM

## 2022-03-17 MED ORDER — DOXYCYCLINE HYCLATE 100 MG PO TABS: 100.0000 mg | ORAL_TABLET | Freq: Two times a day (BID) | ORAL | 0 refills | Status: DC

## 2022-03-17 NOTE — Telephone Encounter (Signed)
Patient called in and states her toe isn't getting any better. Patient would like to see if we could send in a antibiotic for her. Please advise.  ?

## 2022-03-17 NOTE — Telephone Encounter (Signed)
I called and spoke with pt. Pt gave a verbal understanding. 

## 2022-03-17 NOTE — Telephone Encounter (Signed)
Yes, I can send an antibiotic, but it is not usually needed. I would recommend she see a podiatrist. I will send a referral and she should schedule an appointment even after starting the antibiotic just in case it is still not better, thanks.

## 2022-03-20 ENCOUNTER — Other Ambulatory Visit: Payer: Self-pay | Admitting: Physician Assistant

## 2022-03-20 DIAGNOSIS — I10 Essential (primary) hypertension: Secondary | ICD-10-CM

## 2022-04-03 ENCOUNTER — Telehealth: Payer: Self-pay | Admitting: Family Medicine

## 2022-04-03 NOTE — Telephone Encounter (Signed)
Pt had Zilretta on 3/27, per daughter it has worn off and she is unsure when she can have another one ( would like to schedule/ I was unsure of appropriate timing/ordering )

## 2022-04-04 NOTE — Telephone Encounter (Signed)
Addendum to last note.  Called pt's daughter Mateo Flow and left detailed VM w/ this info.

## 2022-04-04 NOTE — Telephone Encounter (Signed)
Called pt's daughter Vikki Ports and relayed Dr. Zollie Pee message regarding timing of another Zilretta injection.  If pt wants a repeat Zilretta injection will need to wait until last week in June 2023 for that.

## 2022-04-04 NOTE — Telephone Encounter (Signed)
Timing should be 3 months which would be the end of June.  Unfortunately need to make this last about another month if possible.

## 2022-04-24 ENCOUNTER — Ambulatory Visit (HOSPITAL_COMMUNITY): Payer: Medicare PPO | Attending: Cardiovascular Disease

## 2022-04-24 DIAGNOSIS — I10 Essential (primary) hypertension: Secondary | ICD-10-CM | POA: Diagnosis not present

## 2022-04-24 DIAGNOSIS — I420 Dilated cardiomyopathy: Secondary | ICD-10-CM | POA: Diagnosis not present

## 2022-04-24 LAB — ECHOCARDIOGRAM COMPLETE
Area-P 1/2: 4.6 cm2
S' Lateral: 4.1 cm

## 2022-04-30 ENCOUNTER — Other Ambulatory Visit: Payer: Self-pay | Admitting: Physician Assistant

## 2022-04-30 DIAGNOSIS — I10 Essential (primary) hypertension: Secondary | ICD-10-CM

## 2022-05-04 DIAGNOSIS — F3340 Major depressive disorder, recurrent, in remission, unspecified: Secondary | ICD-10-CM | POA: Diagnosis not present

## 2022-05-09 ENCOUNTER — Ambulatory Visit: Payer: Self-pay

## 2022-05-09 ENCOUNTER — Ambulatory Visit (INDEPENDENT_AMBULATORY_CARE_PROVIDER_SITE_OTHER): Payer: Medicare PPO | Admitting: Family Medicine

## 2022-05-09 ENCOUNTER — Encounter: Payer: Self-pay | Admitting: Physician Assistant

## 2022-05-09 DIAGNOSIS — M1711 Unilateral primary osteoarthritis, right knee: Secondary | ICD-10-CM

## 2022-05-09 DIAGNOSIS — G8929 Other chronic pain: Secondary | ICD-10-CM | POA: Diagnosis not present

## 2022-05-09 DIAGNOSIS — M25561 Pain in right knee: Secondary | ICD-10-CM

## 2022-05-09 NOTE — Progress Notes (Signed)
[name] presents to clinic today for Zilretta injection right knee  Procedure: Real-time Ultrasound Guided aspiration and injection of right knee Device: Philips Affiniti 50G Images permanently stored and available for review in PACS Verbal informed consent obtained.  Discussed risks and benefits of procedure. Warned about infection, bleeding, hyperglycemia damage to structures among others. Patient expresses understanding and agreement Time-out conducted.   Noted no overlying erythema, induration, or other signs of local infection.   Skin prepped in a sterile fashion.   Local anesthesia: Topical Ethyl chloride.   With sterile technique and under real time ultrasound guidance: 2 mL of lidocaine injected into right knee superior lateral space achieving good anesthesia. Skin again sterilized with isopropyl alcohol.   18-gauge needle used to access the knee joint and 30 mL of clear yellow fluid aspirated. Syringe was exchanged and 32 mg of Zilretta was injected into the knee joint. . Fluid seen entering the joint.   Completed without difficulty    Advised to call if fevers/chills, erythema, induration, drainage, or persistent bleeding.   Images permanently stored and available for review in the ultrasound unit.  Impression: Technically successful ultrasound guided injection.  Return in 3 months.  Can proceed with repeat Zilretta injection.

## 2022-05-10 ENCOUNTER — Encounter: Payer: Self-pay | Admitting: Physician Assistant

## 2022-05-10 ENCOUNTER — Ambulatory Visit: Payer: Medicare PPO | Admitting: Physician Assistant

## 2022-05-10 VITALS — BP 159/74 | HR 83 | Temp 97.4°F | Ht 65.0 in | Wt 138.8 lb

## 2022-05-10 DIAGNOSIS — R350 Frequency of micturition: Secondary | ICD-10-CM

## 2022-05-10 DIAGNOSIS — N390 Urinary tract infection, site not specified: Secondary | ICD-10-CM | POA: Diagnosis not present

## 2022-05-10 LAB — POCT URINALYSIS DIPSTICK
Bilirubin, UA: NEGATIVE
Blood, UA: NEGATIVE
Glucose, UA: NEGATIVE
Ketones, UA: NEGATIVE
Nitrite, UA: NEGATIVE
Protein, UA: POSITIVE — AB
Spec Grav, UA: 1.025 (ref 1.010–1.025)
Urobilinogen, UA: 0.2 E.U./dL
pH, UA: 5.5 (ref 5.0–8.0)

## 2022-05-10 MED ORDER — CEPHALEXIN 500 MG PO CAPS: 500.0000 mg | ORAL_CAPSULE | Freq: Three times a day (TID) | ORAL | 0 refills | Status: AC

## 2022-05-10 NOTE — Patient Instructions (Signed)
Good to see you as always! Start on cephalexin for likely UTI. Drink plenty of water. I will call with urine culture report in a few days, change antibiotic at that time if needed.  Call back sooner if worse or any changes.

## 2022-05-10 NOTE — Progress Notes (Signed)
Subjective:    Patient ID: [name] [name], female    DOB: 07/30/1927, 88 y.o.   MRN: [id number]  Chief Complaint  Patient presents with   Urinary Tract Infection    Urinary Tract Infection    Patient is in today for possible UTI. Here with her daughter. Mostly having urinary frequency. Nocturia at least 3x per night in the last week. No dysuria. No hematuria. No f/chills. No abd pain/ n/v.   Past Medical History:  Diagnosis Date   Anemia    Anxiety and depression 07/16/2009   ASD (atrial septal defect) per bubble study, 2004    Cardiomyopathy, normal cath 2007, normal EF per ECHO 2014    Hx of colonic polyps    Hypertension    Osteoarthritis    Osteopenia    Podagra 04/29/2014   RLS (restless legs syndrome) 07/10/2009    Past Surgical History:  Procedure Laterality Date   BACK SURGERY  2000   BREAST SURGERY     NEGATIVE BIOPSY   TONSILLECTOMY      Family History  Problem Relation Age of Onset   Heart attack Father    Heart disease Mother    Hypertension Other    Diabetes Neg Hx    Colon cancer Neg Hx     Social History   Tobacco Use   Smoking status: Never   Smokeless tobacco: Never  Substance Use Topics   Alcohol use: No    Alcohol/week: 0.0 standard drinks of alcohol   Drug use: No     Allergies  Allergen Reactions   Shellfish Allergy Anaphylaxis   Codeine Other (See Comments)    Unknown.   Hydrocodone Other (See Comments)    Unknown.   Adhesive [Tape] Rash    Review of Systems NEGATIVE UNLESS OTHERWISE INDICATED IN HPI      Objective:     BP (!) 158/73   Pulse 83   Temp (!) 97.4 F (36.3 C) (Temporal)   Ht 5\' 5"  (1.651 m)   Wt 138 lb 12.8 oz (63 kg)   LMP  (LMP Unknown)   SpO2 100%   BMI 23.10 kg/m   Wt Readings from Last 3 Encounters:  05/10/22 138 lb 12.8 oz (63 kg)  03/09/22 142 lb 2 oz (64.5 kg)  02/06/22 143 lb 3.2 oz (65 kg)    BP Readings from Last 3 Encounters:  05/10/22 (!) 158/73  03/09/22 (!) 175/90   02/06/22 (!) 146/80     Physical Exam Vitals and nursing note reviewed.  Constitutional:      General: She is not in acute distress.    Appearance: Normal appearance. She is not ill-appearing.  HENT:     Head: Normocephalic and atraumatic.  Cardiovascular:     Rate and Rhythm: Normal rate and regular rhythm.     Pulses: Normal pulses.     Heart sounds: Normal heart sounds.  Pulmonary:     Effort: Pulmonary effort is normal.     Breath sounds: Normal breath sounds.  Abdominal:     General: Abdomen is flat. Bowel sounds are normal.     Palpations: Abdomen is soft.     Tenderness: There is no right CVA tenderness or left CVA tenderness.  Skin:    General: Skin is warm and dry.  Neurological:     General: No focal deficit present.     Mental Status: She is alert.  Psychiatric:        Mood and Affect: Mood  normal.        Assessment & Plan:   Problem List Items Addressed This Visit   None Visit Diagnoses     Urinary frequency    -  Primary   Relevant Orders   POCT Urinalysis Dipstick (Completed)   Urine Culture        Meds ordered this encounter  Medications   cephALEXin (KEFLEX) 500 MG capsule    Sig: Take 1 capsule (500 mg total) by mouth 3 (three) times daily for 7 days.    Dispense:  21 capsule    Refill:  0    Order Specific Question:   Supervising Provider    Answer:   Shelva Majestic [[id number]]    PLAN: U/A performed in office today. Will send urine for culture. Treat with cephalexin at this time, change pending UC report if needed. Increase water intake. May take AZO for symptomatic relief. Recheck sooner if fever, severe back pain, vomiting, or other acutely worsening symptoms.    Return in about 4 weeks (around 06/07/2022) for discussion about knee pain chronic management +?tramadol .  This note was prepared with assistance of Conservation officer, historic buildings. Occasional wrong-word or sound-a-like substitutions may have occurred due to the inherent  limitations of voice recognition software.   Chemika Nightengale M Andelyn Spade, PA-C

## 2022-05-12 LAB — URINE CULTURE
MICRO NUMBER:: 13583347
SPECIMEN QUALITY:: ADEQUATE

## 2022-05-16 ENCOUNTER — Other Ambulatory Visit: Payer: Self-pay | Admitting: Physician Assistant

## 2022-05-16 MED ORDER — NITROFURANTOIN MONOHYD MACRO 100 MG PO CAPS: 100.0000 mg | ORAL_CAPSULE | Freq: Two times a day (BID) | ORAL | 0 refills | Status: DC

## 2022-05-18 ENCOUNTER — Other Ambulatory Visit: Payer: Self-pay | Admitting: Physician Assistant

## 2022-05-18 DIAGNOSIS — I10 Essential (primary) hypertension: Secondary | ICD-10-CM

## 2022-05-24 ENCOUNTER — Telehealth: Payer: Self-pay | Admitting: Physician Assistant

## 2022-05-24 NOTE — Telephone Encounter (Signed)
Please see note and advise  

## 2022-05-24 NOTE — Telephone Encounter (Signed)
Returned pt call to get more information and schedule an appt for another urine sample; unable to lvm due to mailbox being full

## 2022-05-24 NOTE — Telephone Encounter (Signed)
Pt's daughter states pt has finished 2nd round of antibiotics but is still having the urgency to urinate. She is asking if she needs to come in for another visit or more medication. Please advise

## 2022-05-25 DIAGNOSIS — Z1231 Encounter for screening mammogram for malignant neoplasm of breast: Secondary | ICD-10-CM | POA: Diagnosis not present

## 2022-05-25 LAB — HM MAMMOGRAPHY

## 2022-05-25 NOTE — Telephone Encounter (Signed)
Spoke with patient daughter and scheduled pt for another follow up and urine sample appt.

## 2022-05-26 ENCOUNTER — Encounter: Payer: Self-pay | Admitting: Physician Assistant

## 2022-05-26 ENCOUNTER — Other Ambulatory Visit: Payer: Self-pay | Admitting: Physician Assistant

## 2022-05-26 ENCOUNTER — Ambulatory Visit: Payer: Medicare PPO | Admitting: Physician Assistant

## 2022-05-26 VITALS — BP 140/82 | HR 96 | Temp 97.6°F | Ht 65.0 in | Wt 139.4 lb

## 2022-05-26 DIAGNOSIS — R350 Frequency of micturition: Secondary | ICD-10-CM

## 2022-05-26 LAB — POCT URINALYSIS DIPSTICK
Bilirubin, UA: NEGATIVE
Blood, UA: NEGATIVE
Glucose, UA: NEGATIVE
Ketones, UA: NEGATIVE
Leukocytes, UA: NEGATIVE
Nitrite, UA: NEGATIVE
Protein, UA: NEGATIVE
Spec Grav, UA: 1.01 (ref 1.010–1.025)
Urobilinogen, UA: 0.2 E.U./dL
pH, UA: 6 (ref 5.0–8.0)

## 2022-05-26 MED ORDER — CEPHALEXIN 500 MG PO CAPS: 500.0000 mg | ORAL_CAPSULE | Freq: Three times a day (TID) | ORAL | 0 refills | Status: DC

## 2022-05-26 MED ORDER — NITROFURANTOIN MONOHYD MACRO 100 MG PO CAPS: 100.0000 mg | ORAL_CAPSULE | Freq: Two times a day (BID) | ORAL | 0 refills | Status: AC

## 2022-05-26 NOTE — Progress Notes (Signed)
Subjective:    Patient ID: [name] [name], female    DOB: July 16, 1927, 88 y.o.   MRN: [id number]  Chief Complaint  Patient presents with   Urinary Tract Infection    Pt still having symptoms of urinary frequency; but no other symptoms. Pt states going to restroom some during the day but more at night and unusual for her.     HPI Patient is in today for recheck UTI symptoms. UC positive for E. Coli on 05/10/22. Originally started on cephalexin, then had to switch to macrobid. She finished entire course, still having frequency symptoms, especially at night. No other symptoms.   Past Medical History:  Diagnosis Date   Anemia    Anxiety and depression 07/16/2009   ASD (atrial septal defect) per bubble study, 2004    Cardiomyopathy, normal cath 2007, normal EF per ECHO 2014    Hx of colonic polyps    Hypertension    Osteoarthritis    Osteopenia    Podagra 04/29/2014   RLS (restless legs syndrome) 07/10/2009    Past Surgical History:  Procedure Laterality Date   BACK SURGERY  2000   BREAST SURGERY     NEGATIVE BIOPSY   TONSILLECTOMY      Family History  Problem Relation Age of Onset   Heart attack Father    Heart disease Mother    Hypertension Other    Diabetes Neg Hx    Colon cancer Neg Hx     Social History   Tobacco Use   Smoking status: Never   Smokeless tobacco: Never  Substance Use Topics   Alcohol use: No    Alcohol/week: 0.0 standard drinks of alcohol   Drug use: No     Allergies  Allergen Reactions   Shellfish Allergy Anaphylaxis   Codeine Other (See Comments)    Unknown.   Hydrocodone Other (See Comments)    Unknown.   Adhesive [Tape] Rash    Review of Systems NEGATIVE UNLESS OTHERWISE INDICATED IN HPI      Objective:     BP 140/82 (BP Location: Right Arm)   Pulse 96   Temp 97.6 F (36.4 C) (Temporal)   Ht 5\' 5"  (1.651 m)   Wt 139 lb 6.4 oz (63.2 kg)   LMP  (LMP Unknown)   BMI 23.20 kg/m   Wt Readings from Last 3 Encounters:   05/26/22 139 lb 6.4 oz (63.2 kg)  05/10/22 138 lb 12.8 oz (63 kg)  03/09/22 142 lb 2 oz (64.5 kg)    BP Readings from Last 3 Encounters:  05/26/22 140/82  05/10/22 (!) 159/74  03/09/22 (!) 175/90     Physical Exam Vitals and nursing note reviewed.  Constitutional:      General: She is not in acute distress.    Appearance: Normal appearance. She is not ill-appearing.  HENT:     Head: Normocephalic and atraumatic.  Cardiovascular:     Rate and Rhythm: Normal rate and regular rhythm.     Pulses: Normal pulses.     Heart sounds: Normal heart sounds.  Pulmonary:     Effort: Pulmonary effort is normal.     Breath sounds: Normal breath sounds.  Abdominal:     General: Abdomen is flat. Bowel sounds are normal.     Palpations: Abdomen is soft.     Tenderness: There is no right CVA tenderness or left CVA tenderness.  Skin:    General: Skin is warm and dry.  Neurological:  General: No focal deficit present.     Mental Status: She is alert.  Psychiatric:        Mood and Affect: Mood normal.        Assessment & Plan:   Problem List Items Addressed This Visit   None Visit Diagnoses     Urinary frequency    -  Primary   Relevant Orders   POCT urinalysis dipstick (Completed)   Urine Culture        Meds ordered this encounter  Medications   nitrofurantoin, macrocrystal-monohydrate, (MACROBID) 100 MG capsule    Sig: Take 1 capsule (100 mg total) by mouth 2 (two) times daily for 7 days.    Dispense:  14 capsule    Refill:  0    Order Specific Question:   Supervising Provider    Answer:   Tana Conch O [[id number]]   1. Urinary frequency Likely still mild urinary infection despite UA being normal today. Plan to culture urine. Start on second course of macrobid this weekend. Change tx course pending culture report.   Geovonni Meyerhoff M Malai Lady, PA-C

## 2022-05-29 ENCOUNTER — Other Ambulatory Visit: Payer: Self-pay

## 2022-05-29 ENCOUNTER — Ambulatory Visit (INDEPENDENT_AMBULATORY_CARE_PROVIDER_SITE_OTHER): Payer: Medicare PPO | Admitting: Family Medicine

## 2022-05-29 VITALS — BP 162/88 | HR 66 | Ht 65.0 in | Wt 140.4 lb

## 2022-05-29 DIAGNOSIS — G8929 Other chronic pain: Secondary | ICD-10-CM | POA: Diagnosis not present

## 2022-05-29 DIAGNOSIS — M25561 Pain in right knee: Secondary | ICD-10-CM

## 2022-05-29 DIAGNOSIS — M1711 Unilateral primary osteoarthritis, right knee: Secondary | ICD-10-CM | POA: Diagnosis not present

## 2022-05-29 LAB — URINE CULTURE
MICRO NUMBER:: 13648464
SPECIMEN QUALITY:: ADEQUATE

## 2022-05-29 NOTE — Progress Notes (Signed)
   I, Philbert Riser, LAT, ATC acting as a scribe for [name] Graham, MD.  [name] [name] is a 88 y.o. female who presents to [facility] today for continued R knee pain. Pt was last seen by Dr. Denyse Amass on 05/09/22 and her R knee was aspirated and injected w/ Zilretta. Pt had a f/u visit w/ Dr. Roda Shutters on 02/10/22 and was advised against having a TKR. Today, pt reports improvement in the R knee swelling. Pt c/o R knee feels like it has a valgus force when she's standing and wants Dr. Denyse Amass to look at this and her brace. Pt notes improvement in her R knee pain and is able to ambulate better.   Dx imaging: 04/05/21 R knee XR  Pertinent review of systems: No fevers or chills  Relevant historical information: Hyperlipidemia.  Hypertension.   Exam:  LMP  (LMP Unknown)  General: Well Developed, well nourished, and in no acute distress.   MSK: Right knee: Mild effusion.  Significant genu valgus. Range of motion decreased extension and flexion. Intact strength.   Assessment and Plan: 88 y.o. female with significant right knee pain due to DJD.  She is developed a significant genu valgus deformity.  From a purely orthopedic perspective she should have a knee replacement.  However she is 88 years old and will be 89 in 1 month.  She has several health comorbidities that would make a knee replacement a last desirable outcome for her.  From a conservative management option we will continue intermittent Zilretta injections and focus on quad strength and knee compression and hinged knee brace.  We discussed quad strengthening exercises at home including straight leg raise at home and as well as recumbent exercise bike at home.  Check back with me as needed.  Total encounter time 30 minutes including face-to-face time with the patient and, reviewing past medical record, and charting on the date of service.      Discussed warning signs or symptoms. Please see discharge  instructions. Patient expresses understanding.   The above documentation has been reviewed and is accurate and complete [name] [name], M.D.

## 2022-05-29 NOTE — Patient Instructions (Addendum)
Thank you for coming in today.   Recumbent bike and straight leg raise.   Recheck as needed.

## 2022-06-05 ENCOUNTER — Telehealth: Payer: Self-pay | Admitting: Cardiovascular Disease

## 2022-06-05 NOTE — Telephone Encounter (Signed)
Patient's daughter returned call to get echo results.

## 2022-06-05 NOTE — Telephone Encounter (Signed)
Wendall Stade, MD  Ethelda Chick, RN Mildly decreased EF and mil/mod MR/AR stable   Called patient's daughter [facility]) back with echo results.

## 2022-06-06 ENCOUNTER — Other Ambulatory Visit: Payer: Self-pay | Admitting: Family Medicine

## 2022-06-06 ENCOUNTER — Telehealth: Payer: Self-pay | Admitting: Family Medicine

## 2022-06-06 NOTE — Telephone Encounter (Signed)
Pt daughter called, asking to have the Voltaren increased to 3-4 tubes per month. She is using 4 times daily and runs out.

## 2022-06-06 NOTE — Telephone Encounter (Signed)
Refill request completed for Voltaren gel.

## 2022-06-07 ENCOUNTER — Encounter: Payer: Self-pay | Admitting: Physician Assistant

## 2022-06-07 ENCOUNTER — Ambulatory Visit: Payer: Medicare PPO | Admitting: Physician Assistant

## 2022-06-07 VITALS — BP 166/64 | HR 81 | Temp 97.9°F | Ht 65.0 in | Wt 141.4 lb

## 2022-06-07 DIAGNOSIS — N390 Urinary tract infection, site not specified: Secondary | ICD-10-CM | POA: Diagnosis not present

## 2022-06-07 DIAGNOSIS — M25561 Pain in right knee: Secondary | ICD-10-CM

## 2022-06-07 DIAGNOSIS — R350 Frequency of micturition: Secondary | ICD-10-CM | POA: Diagnosis not present

## 2022-06-07 DIAGNOSIS — G8929 Other chronic pain: Secondary | ICD-10-CM | POA: Diagnosis not present

## 2022-06-07 LAB — POC URINALSYSI DIPSTICK (AUTOMATED)
Bilirubin, UA: NEGATIVE
Blood, UA: NEGATIVE
Glucose, UA: NEGATIVE
Ketones, UA: NEGATIVE
Leukocytes, UA: NEGATIVE
Nitrite, UA: NEGATIVE
Protein, UA: NEGATIVE
Spec Grav, UA: 1.01 (ref 1.010–1.025)
Urobilinogen, UA: 0.2 E.U./dL
pH, UA: 6 (ref 5.0–8.0)

## 2022-06-07 NOTE — Progress Notes (Signed)
Subjective:    Patient ID: [name] [name], female    DOB: [birth year], 88 y.o.   MRN: [id number]  Chief Complaint  Patient presents with   Follow-up    Pt wants to discuss chronic knee pain and management; still having frequesnt urination and not thinking the medication worked for UTI;     HPI Patient is in today with her daughter for discussion about chronic R knee pain due to DJD management. She is not a candidate for knee replacement. She will continue Zilretta injections with Dr. Denyse Amass at sports medicine. Also working on quad strengthening / recumbent bike exercises. Wearing a support sleeve on the knee. Not much pain complaint since having latest injection. Diclofenac gel helps as well.   UTI - still having frequency, up at night at least 3x; no accidents during the day; felt best after cephalexin course, has not felt resolve with macrobid.   Past Medical History:  Diagnosis Date   Anemia    Anxiety and depression 07/16/2009   ASD (atrial septal defect) per bubble study, 2004    Cardiomyopathy, normal cath 2007, normal EF per ECHO 2014    Hx of colonic polyps    Hypertension    Osteoarthritis    Osteopenia    Podagra 04/29/2014   RLS (restless legs syndrome) 07/10/2009    Past Surgical History:  Procedure Laterality Date   BACK SURGERY  2000   BREAST SURGERY     NEGATIVE BIOPSY   TONSILLECTOMY      Family History  Problem Relation Age of Onset   Heart attack Father    Heart disease Mother    Hypertension Other    Diabetes Neg Hx    Colon cancer Neg Hx     Social History   Tobacco Use   Smoking status: Never   Smokeless tobacco: Never  Substance Use Topics   Alcohol use: No    Alcohol/week: 0.0 standard drinks of alcohol   Drug use: No     Allergies  Allergen Reactions   Shellfish Allergy Anaphylaxis   Codeine Other (See Comments)    Unknown.   Hydrocodone Other (See Comments)    Unknown.   Adhesive [Tape] Rash    Review of Systems NEGATIVE  UNLESS OTHERWISE INDICATED IN HPI      Objective:     BP (!) 166/64 (BP Location: Right Arm)   Pulse 81   Temp 97.9 F (36.6 C) (Temporal)   Ht 5\' 5"  (1.651 m)   Wt 141 lb 6.4 oz (64.1 kg)   LMP  (LMP Unknown)   SpO2 100%   BMI 23.53 kg/m   Wt Readings from Last 3 Encounters:  06/07/22 141 lb 6.4 oz (64.1 kg)  05/29/22 140 lb 6.4 oz (63.7 kg)  05/26/22 139 lb 6.4 oz (63.2 kg)    BP Readings from Last 3 Encounters:  06/07/22 (!) 166/64  05/29/22 (!) 162/88  05/26/22 140/82     Physical Exam Constitutional:      Appearance: Normal appearance.  Cardiovascular:     Rate and Rhythm: Normal rate and regular rhythm.  Pulmonary:     Effort: Pulmonary effort is normal.     Breath sounds: Normal breath sounds.  Abdominal:     General: Abdomen is flat. There is no distension.     Palpations: Abdomen is soft. There is no mass.     Tenderness: There is no right CVA tenderness or left CVA tenderness.  Musculoskeletal:  Right knee: Deformity (valgus deformity) present. Normal range of motion.  Neurological:     Mental Status: She is alert.        Assessment & Plan:   Problem List Items Addressed This Visit   None Visit Diagnoses     Urinary frequency    -  Primary   Relevant Orders   POCT Urinalysis Dipstick (Automated) (Completed)   Urine Culture   Recurrent UTI       Relevant Orders   POCT Urinalysis Dipstick (Automated) (Completed)   Urine Culture   Chronic pain of right knee          1. Urinary frequency 2. Recurrent UTI Recheck UA and UC today; if still infection, needs repeat antibiotic course May need referral with urologist She may try cranberry juice or tablets for prevention  3. Chronic pain of right knee Doing much better with Zilretta injections and support sleeve. Tylenol arthritis twice daily. I don't think we need to pursue PO medications at this time. Pt will let me know if anything changes.     Taro Hidrogo M Zac Torti, PA-C

## 2022-06-07 NOTE — Telephone Encounter (Signed)
Patient's daughter called back asking if the quantity could be increased to 3 or 4 tubes per refill so that she is able to pick up a larger amount? Because she is using it so frequently, they go through it very fast.

## 2022-06-08 LAB — URINE CULTURE
MICRO NUMBER:: 13697321
SPECIMEN QUALITY:: ADEQUATE

## 2022-06-08 MED ORDER — DICLOFENAC SODIUM 1 % EX GEL
4.0000 g | Freq: Four times a day (QID) | CUTANEOUS | 11 refills | Status: DC
Start: 2022-06-08 — End: 2023-11-08

## 2022-06-08 NOTE — Addendum Note (Signed)
Addended by: Rodolph Bong on: 06/08/2022 06:41 AM   Modules accepted: Orders

## 2022-06-08 NOTE — Telephone Encounter (Signed)
Medication sent.

## 2022-06-09 ENCOUNTER — Other Ambulatory Visit: Payer: Self-pay

## 2022-06-09 DIAGNOSIS — R350 Frequency of micturition: Secondary | ICD-10-CM

## 2022-06-24 ENCOUNTER — Other Ambulatory Visit: Payer: Self-pay | Admitting: Physician Assistant

## 2022-06-24 DIAGNOSIS — I10 Essential (primary) hypertension: Secondary | ICD-10-CM

## 2022-06-28 NOTE — Progress Notes (Signed)
Date:  07/06/2022   ID:  [name] [name], [name] [name] 12-05-1926, MRN [id number]  Provider Location: Office  PCP:  Bary Leriche, PA-C  Cardiologist:  Charlton Haws, MD   Electrophysiologist:  None   Evaluation Performed:  Follow-Up Visit  Chief Complaint:  Chronic Systolic CHF  History of Present Illness:    88 y.o. non ischemic DCM normal cath back in 2007 with EF at that time 35%  EF decreased to 25-30% on TTE 12/17/18 and aldactone and entresto added. F/U TTE done 04/24/22  showed improvement to 40-45%   She is swimming at [employer] to finish a memoir about the town of [place]  She is a [employer] grad   Lives with only daughter and has 4 grand children   Recently had injection of right knee for pain and seeing PM&R  Entresto started 12/07/21   Doing well since she is on lasix I told her she could have bananas   Past Medical History:  Diagnosis Date   Anemia    Anxiety and depression 07/16/2009   ASD (atrial septal defect) per bubble study, 2004    Cardiomyopathy, normal cath 2007, normal EF per ECHO 2014    Hx of colonic polyps    Hypertension    Osteoarthritis    Osteopenia    Podagra 04/29/2014   RLS (restless legs syndrome) 07/10/2009   Past Surgical History:  Procedure Laterality Date   BACK SURGERY  2000   BREAST SURGERY     NEGATIVE BIOPSY   TONSILLECTOMY       Current Meds  Medication Sig   acetaminophen (TYLENOL) 650 MG CR tablet Take 650 mg by mouth every 8 (eight) hours as needed.   buPROPion (WELLBUTRIN XL) 150 MG 24 hr tablet Take 450 mg by mouth every morning.    carvedilol (COREG) 25 MG tablet TAKE 1 TABLET BY MOUTH TWICE A DAY WITH MEALS   diclofenac Sodium (VOLTAREN) 1 % GEL Apply 4 g topically 4 (four) times daily. To affected joint.   furosemide (LASIX) 20 MG tablet TAKE 1 TABLET BY MOUTH EVERY OTHER DAY   mirtazapine (REMERON) 30 MG tablet Take 30 mg by mouth at bedtime.    Multiple Vitamin (MULTIVITAMIN) tablet Take 1 tablet by  mouth every morning.   Multiple Vitamins-Minerals (BODY/HAIR/SKIN/NAILS) CAPS Take by mouth.   Multiple Vitamins-Minerals (HAIR SKIN AND NAILS FORMULA PO) Take 2 tablets by mouth daily.   polyethylene glycol (MIRALAX / GLYCOLAX) packet Take 17 g by mouth 3 (three) times a week. Mon, wed, and fri   sacubitril-valsartan (ENTRESTO) 97-103 MG Take 1 tablet by mouth 2 (two) times daily.   spironolactone (ALDACTONE) 25 MG tablet TAKE 1 TABLET BY MOUTH EVERY OTHER DAY   VYZULTA 0.024 % SOLN SMARTSIG:1 Drop(s) In Eye(s) Every Evening     Allergies:   Shellfish allergy, Codeine, Hydrocodone, and Adhesive [tape]   Social History   Tobacco Use   Smoking status: Never   Smokeless tobacco: Never  Substance Use Topics   Alcohol use: No    Alcohol/week: 0.0 standard drinks of alcohol   Drug use: No     Family Hx: The patient's family history includes Heart attack in her father; Heart disease in her mother; Hypertension in an other family member. There is no history of Diabetes or Colon cancer.  ROS:   Please see the history of present illness.     All other systems reviewed and are negative.   Prior CV studies:  The following studies were reviewed today:  Echo 12/17/18 Echo 04/21/20   Labs/Other Tests and Data Reviewed:    EKG:   07/06/2022 SR rate 77 nonspecific ST changes   Recent Labs: 12/07/2021: Hemoglobin 11.1; Platelets 293 12/27/2021: BUN 31; Creatinine, Ser 1.45; Potassium 4.3; Sodium 139   Recent Lipid Panel Lab Results  Component Value Date/Time   CHOL 188 08/23/2020 02:26 PM   TRIG 137 08/23/2020 02:26 PM   HDL 61 08/23/2020 02:26 PM   CHOLHDL 3.1 08/23/2020 02:26 PM   LDLCALC 103 (H) 08/23/2020 02:26 PM   LDLDIRECT 116.5 11/08/2012 09:35 AM    Wt Readings from Last 3 Encounters:  07/06/22 139 lb 3.2 oz (63.1 kg)  06/07/22 141 lb 6.4 oz (64.1 kg)  05/29/22 140 lb 6.4 oz (63.7 kg)     Objective:    Vital Signs:  BP 114/64   Pulse 81   Ht 5\' 5"  (1.651 m)   Wt  139 lb 3.2 oz (63.1 kg)   LMP  (LMP Unknown)   SpO2 99%   BMI 23.16 kg/m  My reading after 15 minutes 138/80 mmHg   Affect appropriate Healthy:  appears stated age HEENT: normal Neck supple with no adenopathy JVP normal no bruits no thyromegaly Lungs clear with no wheezing and good diaphragmatic motion Heart:  S1/S2 no murmur, no rub, gallop or click PMI enlarged  Abdomen: benighn, BS positve, no tenderness, no AAA no bruit.  No HSM or HJR Distal pulses intact with no bruits No edema Neuro non-focal Skin warm and dry No muscular weakness   ASSESSMENT & PLAN:    HTN: improved with entresto   DCM: EF declined echo 12/17/18 25-30% now improved on entresto 40-45% continue medical Rx   PVC" asymptomatic better with beta blocker   Anxiety/Depression  Stable continue welburin   Ortho:  tricompartmental osteoarthritis with chondrocalcinosis right knee f/u sports medicine has had PT and injections with drainage of effusions Using Voltaren Gel  UTI:  recurrent after course of cephalosporin and macrobid Per primary consider urology referral UA/culture negative 06/07/22   Medication Adjustments/Labs and Tests Ordered:  None   Tests Ordered:  None  Medication Changes: No orders of the defined types were placed in this encounter.   Disposition:  Follow up in  6 months  Signed, 06/09/22, MD  07/06/2022 8:31 AM    [facility]

## 2022-07-06 ENCOUNTER — Ambulatory Visit: Payer: Medicare PPO | Admitting: Cardiovascular Disease

## 2022-07-06 ENCOUNTER — Encounter: Payer: Self-pay | Admitting: Cardiovascular Disease

## 2022-07-06 VITALS — BP 114/64 | HR 81 | Ht 65.0 in | Wt 139.2 lb

## 2022-07-06 DIAGNOSIS — I420 Dilated cardiomyopathy: Secondary | ICD-10-CM

## 2022-07-06 DIAGNOSIS — I10 Essential (primary) hypertension: Secondary | ICD-10-CM

## 2022-07-06 NOTE — Patient Instructions (Addendum)
Medication Instructions:  Your physician recommends that you continue on your current medications as directed. Please refer to the Current Medication list given to you today.  *If you need a refill on your cardiac medications before your next appointment, please call your pharmacy*  Lab Work: If you have labs (blood work) drawn today and your tests are completely normal, you will receive your results only by: MyChart Message (if you have MyChart) OR A paper copy in the mail If you have any lab test that is abnormal or we need to change your treatment, we will call you to review the results.  Testing/Procedures: None ordered today.  Follow-Up: At [facility], you and your health needs are our priority.  As part of our continuing mission to provide you with exceptional heart care, we have created designated Provider Care Teams.  These Care Teams include your primary Cardiologist (physician) and Advanced Practice Providers (APPs -  Physician Assistants and Nurse Practitioners) who all work together to provide you with the care you need, when you need it.  We recommend signing up for the patient portal called "MyChart".  Sign up information is provided on this After Visit Summary.  MyChart is used to connect with patients for Virtual Visits (Telemedicine).  Patients are able to view lab/test results, encounter notes, upcoming appointments, etc.  Non-urgent messages can be sent to your provider as well.   To learn more about what you can do with MyChart, go to [email or web address].    Your next appointment:   6 month  The format for your next appointment:   In Person  Provider:   Peter Nishan, MD {  Important Information About Sugar       

## 2022-07-07 DIAGNOSIS — R351 Nocturia: Secondary | ICD-10-CM | POA: Diagnosis not present

## 2022-07-07 DIAGNOSIS — N3941 Urge incontinence: Secondary | ICD-10-CM | POA: Diagnosis not present

## 2022-07-07 DIAGNOSIS — N952 Postmenopausal atrophic vaginitis: Secondary | ICD-10-CM | POA: Diagnosis not present

## 2022-08-07 ENCOUNTER — Encounter: Payer: Self-pay | Admitting: *Deleted

## 2022-08-08 ENCOUNTER — Ambulatory Visit: Payer: Self-pay

## 2022-08-08 ENCOUNTER — Ambulatory Visit: Payer: Medicare PPO | Admitting: Family Medicine

## 2022-08-08 DIAGNOSIS — G8929 Other chronic pain: Secondary | ICD-10-CM

## 2022-08-08 DIAGNOSIS — M25561 Pain in right knee: Secondary | ICD-10-CM | POA: Diagnosis not present

## 2022-08-08 DIAGNOSIS — M1711 Unilateral primary osteoarthritis, right knee: Secondary | ICD-10-CM

## 2022-08-08 MED ORDER — TRIAMCINOLONE ACETONIDE 32 MG IX SRER
32.0000 mg | Freq: Once | INTRA_ARTICULAR | Status: AC
  Administered 2022-08-08: 32 mg via INTRA_ARTICULAR

## 2022-08-08 NOTE — Patient Instructions (Addendum)
Thank you for coming in today.   You received an injection today. Seek immediate medical attention if the joint becomes red, extremely painful, or is oozing fluid.   I recommend talking with Dr Erlinda Hong about knee replacement in Dec or January.

## 2022-08-08 NOTE — Progress Notes (Signed)
[name] presents to clinic today for Zilretta injection right knee  Procedure: Real-time Ultrasound Guided aspiration and injection of right knee superior lateral patellar space Device: Philips Affiniti 50G Images permanently stored and available for review in PACS Verbal informed consent obtained.  Discussed risks and benefits of procedure. Warned about infection, bleeding, hyperglycemia damage to structures among others. Patient expresses understanding and agreement Time-out conducted.   Noted no overlying erythema, induration, or other signs of local infection.   Skin prepped in a sterile fashion.   Local anesthesia: Topical Ethyl chloride.   With sterile technique and under real time ultrasound guidance: 3 mL of lidocaine injected superior lateral patella space achieving good anesthesia. Skin sterilized with isopropyl alcohol. 18-gauge needle used to access the joint effusion and 40 milliliters of clear strawberry straw-colored fluid aspirated. Syringe was exchanged and Zilretta 32 mg injected into the knee joint. Fluid seen entering the joint capsule.   Completed without difficulty   Advised to call if fevers/chills, erythema, induration, drainage, or persistent bleeding.   Images permanently stored and available for review in the ultrasound unit.  Impression: Technically successful ultrasound guided injection.  Lot number: [id number]  Can repeat the injection in 3 months which would be around December 26 Recommend surgical consultation to reevaluate total knee replacement.  She is developing significant angulation and lack of range of motion.

## 2022-08-10 DIAGNOSIS — N3941 Urge incontinence: Secondary | ICD-10-CM | POA: Diagnosis not present

## 2022-08-10 DIAGNOSIS — R351 Nocturia: Secondary | ICD-10-CM | POA: Diagnosis not present

## 2022-08-11 ENCOUNTER — Telehealth: Payer: Self-pay

## 2022-08-11 ENCOUNTER — Telehealth: Payer: Self-pay | Admitting: Family Medicine

## 2022-08-11 MED ORDER — TRAMADOL HCL 50 MG PO TABS: 50.0000 mg | ORAL_TABLET | Freq: Three times a day (TID) | ORAL | 0 refills | Status: DC | PRN

## 2022-08-11 NOTE — Telephone Encounter (Signed)
Pt's daughter called and asked for a rx of tramadol for break through pain and wants to be re-fitted for a knee brace

## 2022-08-11 NOTE — Telephone Encounter (Signed)
Tramadol refilled.  Will discuss with Reed Breech retry brace

## 2022-08-14 ENCOUNTER — Telehealth: Payer: Self-pay | Admitting: Family Medicine

## 2022-08-14 NOTE — Telephone Encounter (Signed)
Patient's daughter called to let Dr Georgina Snell know that [name] [name] is scheduled to see Dr Erlinda Hong tomorrow. She mentioned that it was said at her last visit that Dr Georgina Snell would discuss with Dr Erlinda Hong about the current issues that [name] [name] is having so that he would know prior to her visit.  She wanted to make sure this was done before their appointment tomorrow.

## 2022-08-14 NOTE — Telephone Encounter (Signed)
Typically the company will send some forms to [place]. I would recommend contacting the chair lift company. They are usually very helpful at getting the insurance to pay for it.  They generally will send forms to me to fill out and make sure I do it correctly for your insurance company.  Ellard Artis

## 2022-08-14 NOTE — Telephone Encounter (Signed)
Patient's daughter called stating that that a stair lift was discussed at her last visit. She contacted [facility] and was told that it would need a Prior Authorization before being sent to [employer].  I told her that I wasn't sure about the Prior Auth from [place] but I would check.  Please advise.

## 2022-08-15 ENCOUNTER — Ambulatory Visit (INDEPENDENT_AMBULATORY_CARE_PROVIDER_SITE_OTHER): Payer: Medicare PPO

## 2022-08-15 ENCOUNTER — Ambulatory Visit: Payer: Medicare PPO | Admitting: Orthopaedic Surgery

## 2022-08-15 DIAGNOSIS — M1711 Unilateral primary osteoarthritis, right knee: Secondary | ICD-10-CM

## 2022-08-15 NOTE — Telephone Encounter (Signed)
Spoke to patient's daughter. She is going to contact the stair lift company.

## 2022-08-15 NOTE — Telephone Encounter (Signed)
Patient's daughter called stating that we would need to do a Prior Authorization for the Stair Lift through "whatever online portal you use for [facility]". She said that it might not be covered but they wanted to try.  She also asked if a script for the stair lift could be sent to [employer] (phone: [phone]).  Please advise.

## 2022-08-15 NOTE — Telephone Encounter (Signed)
Do either of you know anything about this? I have no idea about authorizations for DME's. I have never done one or never been a part of one or heard of one. There are two portals I could try but I have no clue where to start other than I know that I have two portals for [employer]?

## 2022-08-15 NOTE — Progress Notes (Addendum)
Office Visit Note   Patient: [name] [name]           Date of Birth: 1926-11-14           MRN: [id number] Visit Date: 08/15/2022              Requested by: Allwardt, Randa Evens, PA-C [place],  [place] [id number] PCP: Fredirick Lathe, PA-C   Assessment & Plan: Visit Diagnoses:  1. Primary osteoarthritis of right knee     Plan: I explained that this is a difficult situation.  I have serious concerns that her age she would not be able to recover from knee replacement.  I be worried that she would be less active as a result of the surgery.  Also worried that she will be left with a painful stiff knee if she is not able to do the physical therapy.  I do not think it is worth the risk to do the surgery just to correct the valgus deformity.  She is still able to sleep at night and gets 2 months of relief from the Zilretta injection.  We are all in agreement that the best course of action is for continued conservative management.  She is scheduled to see Dr. Georgina Snell to get a custom brace fitted for her knee.  Follow-Up Instructions: No follow-ups on file.   Orders:  Orders Placed This Encounter  Procedures   XR KNEE 3 VIEW RIGHT   No orders of the defined types were placed in this encounter.     Procedures: No procedures performed   Clinical Data: No additional findings.   Subjective: Chief Complaint  Patient presents with   Right Knee - Pain    HPI [name] [name] returns today for evaluation of right knee pain and deformity.  She continues to get Zilretta injections every 3 months.  She gets about 2 months relief from each injection.  Her main complaint is that the knee is in valgus and causing a slight functional limitation although overall she is still able to get around okay.  Review of Systems   Objective: Vital Signs: LMP  (LMP Unknown)   Physical Exam  Ortho Exam Examination of right knee shows a valgus deformity.  Mild flexion  contracture.  Specialty Comments:  No specialty comments available.  Imaging: No results found.   PMFS History: Patient Active Problem List   Diagnosis Date Noted   Depression, recurrent ([place]) 02/20/2020   Prediabetes 01/26/2019   History of exercise intolerance 01/26/2019   HLD (hyperlipidemia) 04/07/2018   DJD (degenerative joint disease) 02/09/2010   PVCs (premature ventricular contractions) 07/20/2009   Anxiety and depression 07/16/2009   Cardiomyopathy ([place]) 04/23/2007   HTN (hypertension) 04/15/2007   Osteopenia 04/15/2007   Past Medical History:  Diagnosis Date   Anemia    Anxiety and depression 07/16/2009   ASD (atrial septal defect) per bubble study, 2004    Cardiomyopathy, normal cath 2007, normal EF per ECHO 2014    Hx of colonic polyps    Hypertension    Osteoarthritis    Osteopenia    Podagra 04/29/2014   RLS (restless legs syndrome) 07/10/2009    Family History  Problem Relation Age of Onset   Heart attack Father    Heart disease Mother    Hypertension Other    Diabetes Neg Hx    Colon cancer Neg Hx     Past Surgical History:  Procedure Laterality Date   BACK SURGERY  2000  BREAST SURGERY     NEGATIVE BIOPSY   TONSILLECTOMY     Social History   Occupational History   Occupation: Engineer, materials: RETIRED  Tobacco Use   Smoking status: Never   Smokeless tobacco: Never  Substance and Sexual Activity   Alcohol use: No    Alcohol/week: 0.0 standard drinks of alcohol   Drug use: No   Sexual activity: Not Currently

## 2022-08-16 NOTE — Telephone Encounter (Signed)
Patient wants the script sent to the [facility] but they do not do anything with insurance. The phone number and fax is provided below for the company.    CALL us Phone: [phone] Fax: [phone] Toll Free: [phone]

## 2022-08-20 ENCOUNTER — Other Ambulatory Visit: Payer: Self-pay | Admitting: Cardiovascular Disease

## 2022-08-25 ENCOUNTER — Other Ambulatory Visit: Payer: Self-pay | Admitting: Physician Assistant

## 2022-08-25 DIAGNOSIS — I10 Essential (primary) hypertension: Secondary | ICD-10-CM

## 2022-09-01 DIAGNOSIS — M1711 Unilateral primary osteoarthritis, right knee: Secondary | ICD-10-CM | POA: Diagnosis not present

## 2022-09-18 ENCOUNTER — Other Ambulatory Visit: Payer: Self-pay | Admitting: Physician Assistant

## 2022-09-18 DIAGNOSIS — I10 Essential (primary) hypertension: Secondary | ICD-10-CM

## 2022-09-23 ENCOUNTER — Other Ambulatory Visit: Payer: Self-pay | Admitting: Cardiovascular Disease

## 2022-10-16 DIAGNOSIS — H401131 Primary open-angle glaucoma, bilateral, mild stage: Secondary | ICD-10-CM | POA: Diagnosis not present

## 2022-10-26 ENCOUNTER — Encounter: Payer: Self-pay | Admitting: *Deleted

## 2022-10-30 ENCOUNTER — Telehealth: Payer: Self-pay | Admitting: *Deleted

## 2022-10-30 ENCOUNTER — Other Ambulatory Visit: Payer: Self-pay | Admitting: Physician Assistant

## 2022-10-30 DIAGNOSIS — I10 Essential (primary) hypertension: Secondary | ICD-10-CM

## 2022-10-30 NOTE — Telephone Encounter (Signed)
-----   Message from Adron Bene sent at 10/27/2022 11:54 AM EST ----- Regarding: Zilretta Please re-authorize repeat Zilretta injection for R knee. Pt scheduled for 12/26.

## 2022-10-30 NOTE — Telephone Encounter (Signed)
R knee Zilretta initiated through portal.  

## 2022-11-01 NOTE — Telephone Encounter (Signed)
Pt will need to have a f/u visit before repeating Zilretta injection.

## 2022-11-03 NOTE — Progress Notes (Unsigned)
   I, [name] [name], LAT, ATC acting as a scribe for [name] Graham, MD.  [name] [name] is a 88 y.o. female who presents to [facility] at [facility] today for f/u chronic R knee pain. Pt was last seen by Dr. Denyse Amass on 08/08/22 and her R knee was aspirated and injected w/ Zilretta. Pt had a f/u visit w/ Dr. Roda Shutters on 08/15/22 who advised against surgery. Today, pt reports R knee pain has returned over the last 2 weeks.   Dx imaging: 08/15/22 R knee XR 04/05/21 R knee XR   Pertinent review of systems: No fevers or chills  Relevant historical information: Cardiomyopathy   Exam:  BP (!) 146/84   Pulse 81   Ht 5\' 5"  (1.651 m)   Wt 144 lb (65.3 kg)   LMP  (LMP Unknown)   SpO2 96%   BMI 23.96 kg/m  General: Well Developed, well nourished, and in no acute distress.   MSK: Right knee large effusion significant genu valgus.  Tender palpation medial joint line. Antalgic gait.    Lab and Radiology Results  Procedure: Real-time Ultrasound Guided Injection of right knee superior lateral patellar space Device: Philips Affiniti 50G Images permanently stored and available for review in PACS Verbal informed consent obtained.  Discussed risks and benefits of procedure. Warned about infection, bleeding, hyperglycemia damage to structures among others. Patient expresses understanding and agreement Time-out conducted.   Noted no overlying erythema, induration, or other signs of local infection.   Skin prepped in a sterile fashion.   Local anesthesia: Topical Ethyl chloride.   With sterile technique and under real time ultrasound guidance: 40 mg of Kenalog and 2 mL of Marcaine injected into knee joint. Fluid seen entering the joint capsule.   Completed without difficulty   Pain immediately resolved suggesting accurate placement of the medication.   Advised to call if fevers/chills, erythema, induration, drainage, or persistent bleeding.   Images permanently stored and available  for review in the ultrasound unit.  Impression: Technically successful ultrasound guided injection.         Assessment and Plan: 88 y.o. female with right knee pain due to DJD.  She is failing conservative management.  She did have almost 3 months of relief with Zilretta.  Unfortunately Zilretta authorization was denied until she has a face-to-face visit which we are completing today.  Will go ahead and do a regular cortisone shot today and have her come back in about a month at which time we can try Zilretta injection if we can get it approved.   PDMP not reviewed this encounter. Orders Placed This Encounter  Procedures   83 LIMITED JOINT SPACE STRUCTURES LOW RIGHT(NO LINKED CHARGES)    Order Specific Question:   Reason for Exam (SYMPTOM  OR DIAGNOSIS REQUIRED)    Answer:   right knee pain    Order Specific Question:   Preferred imaging location?    Answer:   [facility]   No orders of the defined types were placed in this encounter.    Discussed warning signs or symptoms. Please see discharge instructions. Patient expresses understanding.   The above documentation has been reviewed and is accurate and complete US, M.D.

## 2022-11-07 ENCOUNTER — Ambulatory Visit: Payer: Medicare PPO | Admitting: Family Medicine

## 2022-11-07 ENCOUNTER — Ambulatory Visit: Payer: Self-pay

## 2022-11-07 VITALS — BP 146/84 | HR 81 | Ht 65.0 in | Wt 144.0 lb

## 2022-11-07 DIAGNOSIS — G8929 Other chronic pain: Secondary | ICD-10-CM | POA: Diagnosis not present

## 2022-11-07 DIAGNOSIS — M1711 Unilateral primary osteoarthritis, right knee: Secondary | ICD-10-CM

## 2022-11-07 DIAGNOSIS — M25561 Pain in right knee: Secondary | ICD-10-CM

## 2022-11-07 NOTE — Patient Instructions (Addendum)
Thank you for coming in today.   We will retry the authorization.   Anticipate recheck in about 1 month.

## 2022-11-15 ENCOUNTER — Encounter: Payer: Self-pay | Admitting: Physician Assistant

## 2022-11-16 ENCOUNTER — Ambulatory Visit: Payer: Medicare PPO | Admitting: Family

## 2022-11-16 ENCOUNTER — Encounter: Payer: Self-pay | Admitting: Family

## 2022-11-16 VITALS — BP 141/84 | HR 74 | Temp 98.2°F | Ht 65.0 in | Wt 138.1 lb

## 2022-11-16 DIAGNOSIS — L299 Pruritus, unspecified: Secondary | ICD-10-CM

## 2022-11-16 MED ORDER — TRIAMCINOLONE ACETONIDE 0.1 % EX CREA: 1.0000 | TOPICAL_CREAM | Freq: Two times a day (BID) | CUTANEOUS | 0 refills | Status: DC

## 2022-11-16 NOTE — Progress Notes (Signed)
Patient ID: [name] [name], female    DOB: 1927-05-08, 89 y.o.   MRN: [id number]  Chief Complaint  Patient presents with   Rash    Pt c/o rash under left arm, Noticed about 3-4 days ago. Has tried tacrolimus, which she tried twice and could not tell a difference, Rash is itchy.     HPI:      Skin rash:  Pt c/o rash under left armpit, first noticed about 3-4 days ago. Has tried tacrolimus, which she tried twice and could  tell a mild difference, Rash is itchy. Denies any new wash detergents, deoderant, creams. Denies itching or rash anywhere else on her body.     Assessment & Plan:  1. Itchy skin - on outer edge of axilla, but no rash noted, no discolored skin or edema. Advised pt it could be r/t dry skin or aggravated nerve. Advised to clean with moisturizing soap, apply thick creme. OK to continue the tacrolimus powder prn, also sending steroid cream to try.  F/U with PCP if still not resolving.  - triamcinolone cream (KENALOG) 0.1 %; Apply 1 Application topically 2 (two) times daily. For itching and rash.  Dispense: 30 g; Refill: 0  Subjective:    Outpatient Medications Prior to Visit  Medication Sig Dispense Refill   acetaminophen (TYLENOL) 650 MG CR tablet Take 650 mg by mouth every 8 (eight) hours as needed.     buPROPion (WELLBUTRIN XL) 150 MG 24 hr tablet Take 450 mg by mouth every morning.      carvedilol (COREG) 25 MG tablet TAKE 1 TABLET BY MOUTH TWICE A DAY WITH FOOD 180 tablet 0   diclofenac Sodium (VOLTAREN) 1 % GEL Apply 4 g topically 4 (four) times daily. To affected joint. 1000 g 11   furosemide (LASIX) 20 MG tablet TAKE 1 TABLET BY MOUTH EVERY OTHER DAY 45 tablet 2   mirtazapine (REMERON) 30 MG tablet Take 30 mg by mouth at bedtime.      Multiple Vitamin (MULTIVITAMIN) tablet Take 1 tablet by mouth every morning.     Multiple Vitamins-Minerals (BODY/HAIR/SKIN/NAILS) CAPS Take by mouth.     nitrofurantoin, macrocrystal-monohydrate, (MACROBID) 100 MG capsule Take  1 capsule (100 mg total) by mouth 2 (two) times daily. 14 capsule 0   NON FORMULARY Take 3 tablets by mouth daily. Curumin for joint pain     polyethylene glycol (MIRALAX / GLYCOLAX) packet Take 17 g by mouth 3 (three) times a week. Mon, wed, and fri     sacubitril-valsartan (ENTRESTO) 97-103 MG Take 1 tablet by mouth 2 (two) times daily. 60 tablet 11   spironolactone (ALDACTONE) 25 MG tablet TAKE 1 TABLET BY MOUTH EVERY OTHER DAY 45 tablet 2   traMADol (ULTRAM) 50 MG tablet Take 1 tablet (50 mg total) by mouth every 8 (eight) hours as needed for severe pain. 15 tablet 0   VYZULTA 0.024 % SOLN SMARTSIG:1 Drop(s) In Eye(s) Every Evening     Multiple Vitamins-Minerals (HAIR SKIN AND NAILS FORMULA PO) Take 2 tablets by mouth daily.     No facility-administered medications prior to visit.   Past Medical History:  Diagnosis Date   Anemia    Anxiety and depression 07/16/2009   ASD (atrial septal defect) per bubble study, 2004    Cardiomyopathy, normal cath 2007, normal EF per ECHO 2014    Hx of colonic polyps    Hypertension    Osteoarthritis    Osteopenia    Podagra 04/29/2014  RLS (restless legs syndrome) 07/10/2009   Past Surgical History:  Procedure Laterality Date   BACK SURGERY  2000   BREAST SURGERY     NEGATIVE BIOPSY   TONSILLECTOMY     Allergies  Allergen Reactions   Shellfish Allergy Anaphylaxis   Codeine Other (See Comments)    Unknown.   Hydrocodone Other (See Comments)    Unknown.   Adhesive [Tape] Rash      Objective:    Physical Exam Vitals and nursing note reviewed.  Constitutional:      Appearance: Normal appearance.  Cardiovascular:     Rate and Rhythm: Normal rate and regular rhythm.  Pulmonary:     Effort: Pulmonary effort is normal.     Breath sounds: Normal breath sounds.  Musculoskeletal:        General: Normal range of motion.  Skin:    General: Skin is warm and dry.     Findings: No rash (no visible rash or swelling noted where pt states she is  itching under left axilla).  Neurological:     Mental Status: She is alert.  Psychiatric:        Mood and Affect: Mood normal.        Behavior: Behavior normal.    BP (!) 141/84 (BP Location: Right Arm, Patient Position: Sitting, Cuff Size: Normal)   Pulse 74   Temp 98.2 F (36.8 C) (Temporal)   Ht 5\' 5"  (1.651 m)   Wt 138 lb 2 oz (62.7 kg)   LMP  (LMP Unknown)   SpO2 100%   BMI 22.99 kg/m  Wt Readings from Last 3 Encounters:  11/16/22 138 lb 2 oz (62.7 kg)  11/07/22 144 lb (65.3 kg)  07/06/22 139 lb 3.2 oz (63.1 kg)      Jeanie Sewer, NP

## 2022-11-16 NOTE — Patient Instructions (Addendum)
It was very nice to see you today!   I have sent a steroid cream to your pharmacy.  Your itching could be from dry skin. Use a moisturizing body wash/soap when bathing.  Apply a thin layer of the steroid cream to your skin where it is itching. I then recommend you use a thick cream over the steroid cream (can be generic Eucerin, Keri, CeraVe, Cetaphil, or similar).  Call back if your symptoms are not resolving after another week.      PLEASE NOTE:  If you had any lab tests please let us know if you have not heard back within a few days. You may see your results on MyChart before we have a chance to review them but we will give you a call once they are reviewed by [place]. If we ordered any referrals today, please let us know if you have not heard from their office within the next week.

## 2022-11-17 DIAGNOSIS — F3340 Major depressive disorder, recurrent, in remission, unspecified: Secondary | ICD-10-CM | POA: Diagnosis not present

## 2022-11-28 ENCOUNTER — Telehealth: Payer: Self-pay | Admitting: *Deleted

## 2022-11-28 NOTE — Telephone Encounter (Signed)
Right knee Zilretta initiated through portal.

## 2022-11-28 NOTE — Telephone Encounter (Signed)
-----  Message from Mare Ferrari sent at 11/24/2022  9:56 AM EST ----- Regarding: re-auth Pt had in person f/u office visit. Please re-auth Zilretta for her R knee.

## 2022-11-30 NOTE — Telephone Encounter (Signed)
Prior auth submitted through availity.

## 2022-12-03 ENCOUNTER — Other Ambulatory Visit: Payer: Self-pay | Admitting: Physician Assistant

## 2022-12-03 ENCOUNTER — Other Ambulatory Visit: Payer: Self-pay | Admitting: Family

## 2022-12-03 DIAGNOSIS — L299 Pruritus, unspecified: Secondary | ICD-10-CM

## 2022-12-03 DIAGNOSIS — I10 Essential (primary) hypertension: Secondary | ICD-10-CM

## 2022-12-04 NOTE — Telephone Encounter (Signed)
R knee Zilretta approved. 11/30/22-02/28/23.

## 2022-12-07 ENCOUNTER — Ambulatory Visit: Payer: Medicare PPO | Admitting: Family Medicine

## 2022-12-07 ENCOUNTER — Ambulatory Visit: Payer: Self-pay

## 2022-12-07 DIAGNOSIS — M25561 Pain in right knee: Secondary | ICD-10-CM | POA: Diagnosis not present

## 2022-12-07 DIAGNOSIS — M1711 Unilateral primary osteoarthritis, right knee: Secondary | ICD-10-CM

## 2022-12-07 DIAGNOSIS — G8929 Other chronic pain: Secondary | ICD-10-CM | POA: Diagnosis not present

## 2022-12-07 MED ORDER — TRIAMCINOLONE ACETONIDE 32 MG IX SRER
32.0000 mg | Freq: Once | INTRA_ARTICULAR | Status: AC
  Administered 2022-12-07: 32 mg via INTRA_ARTICULAR

## 2022-12-07 NOTE — Progress Notes (Signed)
[name] presents to clinic today for previously arranged Zilretta injection right knee   Procedure: Real-time Ultrasound Guided Injection of right knee superior lateral patellar space Device: Philips Affiniti 50G Images permanently stored and available for review in PACS Verbal informed consent obtained.  Discussed risks and benefits of procedure. Warned about infection, bleeding, hyperglycemia damage to structures among others. Patient expresses understanding and agreement Time-out conducted.   Noted no overlying erythema, induration, or other signs of local infection.   Skin prepped in a sterile fashion.   Local anesthesia: Topical Ethyl chloride.   With sterile technique and under real time ultrasound guidance: Zilretta 32 mg injected into knee joint. Fluid seen entering the joint capsule.   Completed without difficulty   Advised to call if fevers/chills, erythema, induration, drainage, or persistent bleeding.   Images permanently stored and available for review in the ultrasound unit.  Impression: Technically successful ultrasound guided injection.   Lot number: [id number]

## 2022-12-07 NOTE — Patient Instructions (Addendum)
Thank you for coming in today.   You received an injection today. Seek immediate medical attention if the joint becomes red, extremely painful, or is oozing fluid.   Recheck as needed.    

## 2022-12-14 NOTE — Progress Notes (Signed)
Date:  12/21/2022   ID:  [name] [name], Ne[place] 1927/09/04, MRN [id number]  Provider Location: Office  PCP:  Fredirick Lathe, PA-C  Cardiologist:  Jenkins Rouge, MD   Electrophysiologist:  None   Evaluation Performed:  Follow-Up Visit  Chief Complaint:  Chronic Systolic CHF  History of Present Illness:    89 y.o. non ischemic DCM normal cath back in 2007 with EF at that time 35%  EF decreased to 25-30% on TTE 12/17/18 and aldactone and entresto added. F/U TTE done 04/24/22  showed improvement to 40-45%   She is swimming at [employer] to finish a memoir about the town of [place]  She is a [employer] grad   Lives with only daughter and has 4 grand children   Recently had injection of right knee for pain and seeing PM&R  Entresto started 12/07/21   Doing well since she is on lasix I told her she could have bananas   She is really struggling with her knee. Wants a 2nd opinion about possible surgery Dr Earlie Counts would not operate on her ? Due to her age   Past Medical History:  Diagnosis Date   Anemia    Anxiety and depression 07/16/2009   ASD (atrial septal defect) per bubble study, 2004    Cardiomyopathy, normal cath 2007, normal EF per ECHO 2014    Hx of colonic polyps    Hypertension    Osteoarthritis    Osteopenia    Podagra 04/29/2014   RLS (restless legs syndrome) 07/10/2009   Past Surgical History:  Procedure Laterality Date   BACK SURGERY  2000   BREAST SURGERY     NEGATIVE BIOPSY   TONSILLECTOMY       Current Meds  Medication Sig   acetaminophen (TYLENOL) 650 MG CR tablet Take 650 mg by mouth every 8 (eight) hours as needed.   buPROPion (WELLBUTRIN XL) 150 MG 24 hr tablet Take 450 mg by mouth every morning.    carvedilol (COREG) 25 MG tablet TAKE 1 TABLET BY MOUTH TWICE A DAY WITH FOOD   diclofenac Sodium (VOLTAREN) 1 % GEL Apply 4 g topically 4 (four) times daily. To affected joint.   furosemide (LASIX) 20 MG tablet TAKE 1 TABLET BY MOUTH EVERY  OTHER DAY   mirtazapine (REMERON) 30 MG tablet Take 30 mg by mouth at bedtime.    Multiple Vitamin (MULTIVITAMIN) tablet Take 1 tablet by mouth every morning.   Multiple Vitamins-Minerals (BODY/HAIR/SKIN/NAILS) CAPS Take by mouth.   NON FORMULARY Take 3 tablets by mouth daily. Curumin for joint pain   polyethylene glycol (MIRALAX / GLYCOLAX) packet Take 17 g by mouth 3 (three) times a week. Mon, wed, and fri   sacubitril-valsartan (ENTRESTO) 97-103 MG Take 1 tablet by mouth 2 (two) times daily.   spironolactone (ALDACTONE) 25 MG tablet TAKE 1 TABLET BY MOUTH EVERY OTHER DAY   triamcinolone cream (KENALOG) 0.1 % APPLY 1 APPLICATION TOPICALLY 2 (TWO) TIMES DAILY. FOR ITCHING AND RASH.   VYZULTA 0.024 % SOLN SMARTSIG:1 Drop(s) In Eye(s) Every Evening     Allergies:   Shellfish allergy, Codeine, Hydrocodone, and Adhesive [tape]   Social History   Tobacco Use   Smoking status: Never   Smokeless tobacco: Never  Substance Use Topics   Alcohol use: No    Alcohol/week: 0.0 standard drinks of alcohol   Drug use: No     Family Hx: The patient's family history includes Heart attack in her father; Heart disease  in her mother; Hypertension in an other family member. There is no history of Diabetes or Colon cancer.  ROS:   Please see the history of present illness.     All other systems reviewed and are negative.   Prior CV studies:   The following studies were reviewed today:  Echo 12/17/18 Echo 04/21/20   Labs/Other Tests and Data Reviewed:    EKG:   12/21/2022 SR rate 77 nonspecific ST changes   Recent Labs: 12/27/2021: BUN 31; Creatinine, Ser 1.45; Potassium 4.3; Sodium 139   Recent Lipid Panel Lab Results  Component Value Date/Time   CHOL 188 08/23/2020 02:26 PM   TRIG 137 08/23/2020 02:26 PM   HDL 61 08/23/2020 02:26 PM   CHOLHDL 3.1 08/23/2020 02:26 PM   LDLCALC 103 (H) 08/23/2020 02:26 PM   LDLDIRECT 116.5 11/08/2012 09:35 AM    Wt Readings from Last 3 Encounters:   12/21/22 141 lb (64 kg)  11/16/22 138 lb 2 oz (62.7 kg)  11/07/22 144 lb (65.3 kg)     Objective:    Vital Signs:  BP 126/80   Pulse 72   Ht 5\' 5"  (1.651 m)   Wt 141 lb (64 kg)   LMP  (LMP Unknown)   BMI 23.46 kg/m  My reading after 15 minutes 138/80 mmHg   Affect appropriate Healthy:  appears stated age HEENT: normal Neck supple with no adenopathy JVP normal no bruits no thyromegaly Lungs clear with no wheezing and good diaphragmatic motion Heart:  S1/S2 no murmur, no rub, gallop or click PMI enlarged  Abdomen: benighn, BS positve, no tenderness, no AAA no bruit.  No HSM or HJR Distal pulses intact with no bruits No edema Neuro non-focal Skin warm and dry Bilateral knee osteoarthritis with deformity   ASSESSMENT & PLAN:    HTN: improved with entresto   DCM: EF declined echo 12/17/18 25-30% now improved on entresto TTE 04/24/22  40-45% continue medical Rx   PVC" asymptomatic better with beta blocker   Anxiety/Depression  Stable continue welburin   Ortho:  tricompartmental osteoarthritis with chondrocalcinosis right knee f/u sports medicine has had PT and injections with drainage of effusions Using Voltaren Gel Last injection with Zilretta 12/07/22 Refer to Dr Ninfa Linden for 2 nd opinion regarding surgery Despite her age I think she would be a candidate   UTI:  recurrent after course of cephalosporin and macrobid Per primary consider urology referral UA/culture negative 06/07/22   Medication Adjustments/Labs and Tests Ordered:  None   Tests Ordered:  None  Medication Changes: No orders of the defined types were placed in this encounter.   Disposition:  Follow up in  a year   Signed, Jenkins Rouge, MD  12/21/2022 10:20 AM    [facility]

## 2022-12-18 ENCOUNTER — Telehealth: Payer: Self-pay | Admitting: Family Medicine

## 2022-12-18 DIAGNOSIS — M1711 Unilateral primary osteoarthritis, right knee: Secondary | ICD-10-CM

## 2022-12-18 DIAGNOSIS — G8929 Other chronic pain: Secondary | ICD-10-CM

## 2022-12-18 NOTE — Telephone Encounter (Signed)
I am going to refer to you Dr Davy Pique. He is like Dr Ernestina Patches and can offer you some other options including even a peripheral nerve stimulator that could help reduce your knee pain.  He works at [employer].

## 2022-12-18 NOTE — Telephone Encounter (Signed)
Called daughter Mateo Flow) and left VM to call the office.

## 2022-12-18 NOTE — Telephone Encounter (Signed)
Patient's daughter called stating that the patient has not had the same relief as she had in the past from the [place] injection. She asked what other options Dr Georgina Snell would suggest?

## 2022-12-21 ENCOUNTER — Encounter: Payer: Self-pay | Admitting: Cardiovascular Disease

## 2022-12-21 ENCOUNTER — Ambulatory Visit: Payer: Medicare PPO | Attending: Cardiovascular Disease | Admitting: Cardiovascular Disease

## 2022-12-21 VITALS — BP 126/80 | HR 72 | Ht 65.0 in | Wt 141.0 lb

## 2022-12-21 DIAGNOSIS — G8929 Other chronic pain: Secondary | ICD-10-CM | POA: Diagnosis not present

## 2022-12-21 DIAGNOSIS — I420 Dilated cardiomyopathy: Secondary | ICD-10-CM | POA: Diagnosis not present

## 2022-12-21 DIAGNOSIS — M25561 Pain in right knee: Secondary | ICD-10-CM

## 2022-12-21 DIAGNOSIS — I10 Essential (primary) hypertension: Secondary | ICD-10-CM | POA: Diagnosis not present

## 2022-12-21 DIAGNOSIS — Z87898 Personal history of other specified conditions: Secondary | ICD-10-CM | POA: Diagnosis not present

## 2022-12-21 NOTE — Patient Instructions (Addendum)
Medication Instructions:  Your physician recommends that you continue on your current medications as directed. Please refer to the Current Medication list given to you today.     *If you need a refill on your cardiac medications before your next appointment, please call your pharmacy*  Lab Work: If you have labs (blood work) drawn today and your tests are completely normal, you will receive your results only by: [place] (if you have MyChart) OR A paper copy in the mail If you have any lab test that is abnormal or we need to change your treatment, we will call you to review the results.  Testing/Procedures: None ordered today.  Follow-Up: At [facility], you and your health needs are our priority.  As part of our continuing mission to provide you with exceptional heart care, we have created designated Provider Care Teams.  These Care Teams include your primary Cardiologist (physician) and Advanced Practice Providers (APPs -  Physician Assistants and Nurse Practitioners) who all work together to provide you with the care you need, when you need it.  We recommend signing up for the patient portal called "MyChart".  Sign up information is provided on this After Visit Summary.  MyChart is used to connect with patients for Virtual Visits (Telemedicine).  Patients are able to view lab/test results, encounter notes, upcoming appointments, etc.  Non-urgent messages can be sent to your provider as well.   To learn more about what you can do with MyChart, go to [email or web address].    Your next appointment:   6 month(s)  Provider:   Jenkins Rouge, MD     You have been referred to Orthopedic doctor, Dr. Zollie Beckers. [phone]

## 2022-12-21 NOTE — Telephone Encounter (Signed)
Called and spoke with [name] [name], she has been contacted by Dr. Dionne Ano office and scheduled appt for 01/10/23. She will also be seeing Dr. Ninfa Linden on 01/10/23 for a second opinion on knee surgery; everything went really well at her cardiology f/u today. Pt will keep scheduled appointment with Dr. Georgina Snell in April.

## 2022-12-22 ENCOUNTER — Other Ambulatory Visit: Payer: Self-pay | Admitting: Physician Assistant

## 2022-12-22 ENCOUNTER — Other Ambulatory Visit: Payer: Self-pay | Admitting: Cardiovascular Disease

## 2022-12-22 DIAGNOSIS — I10 Essential (primary) hypertension: Secondary | ICD-10-CM

## 2022-12-22 MED ORDER — ENTRESTO 97-103 MG PO TABS: 1.0000 | ORAL_TABLET | Freq: Two times a day (BID) | ORAL | 3 refills | Status: DC

## 2023-01-04 ENCOUNTER — Telehealth: Payer: Self-pay | Admitting: Family Medicine

## 2023-01-04 ENCOUNTER — Other Ambulatory Visit: Payer: Self-pay

## 2023-01-04 ENCOUNTER — Ambulatory Visit (INDEPENDENT_AMBULATORY_CARE_PROVIDER_SITE_OTHER): Payer: Medicare PPO

## 2023-01-04 VITALS — BP 138/82 | HR 79 | Temp 97.8°F | Wt 141.8 lb

## 2023-01-04 DIAGNOSIS — G8929 Other chronic pain: Secondary | ICD-10-CM

## 2023-01-04 DIAGNOSIS — M1711 Unilateral primary osteoarthritis, right knee: Secondary | ICD-10-CM

## 2023-01-04 DIAGNOSIS — Z0000 Encounter for general adult medical examination without abnormal findings: Secondary | ICD-10-CM | POA: Diagnosis not present

## 2023-01-04 MED ORDER — AMBULATORY NON FORMULARY MEDICATION: 0 refills | Status: AC

## 2023-01-04 NOTE — Telephone Encounter (Signed)
Patient's daughter called asking if Dr Georgina Snell could put in an order for the patient for a raised toilet seat?  Please advise.

## 2023-01-04 NOTE — Patient Instructions (Signed)
Ms. [name] [name] , Thank you for taking time to come for your Medicare Wellness Visit. I appreciate your ongoing commitment to your health goals. Please review the following plan we discussed and let me know if I can assist you in the future.   These are the goals we discussed:  Goals      Patient Stated     Stay healthy     Patient Stated     Stay active and healthy        This is a list of the screening recommended for you and due dates:  Health Maintenance  Topic Date Due   Mammogram  05/26/2023   Medicare Annual Wellness Visit  01/05/2024   DTaP/Tdap/Td vaccine (3 - Tdap) 03/21/2027   Pneumonia Vaccine  Completed   Flu Shot  Completed   DEXA scan (bone density measurement)  Completed   COVID-19 Vaccine  Completed   Zoster (Shingles) Vaccine  Completed   HPV Vaccine  Aged Out    Advanced directives: copies in chart   Conditions/risks identified: stay strong and live long   Next appointment: Follow up in one year for your annual wellness visit    Preventive Care 65 Years and Older, Female Preventive care refers to lifestyle choices and visits with your health care provider that can promote health and wellness. What does preventive care include? A yearly physical exam. This is also called an annual well check. Dental exams once or twice a year. Routine eye exams. Ask your health care provider how often you should have your eyes checked. Personal lifestyle choices, including: Daily care of your teeth and gums. Regular physical activity. Eating a healthy diet. Avoiding tobacco and drug use. Limiting alcohol use. Practicing safe sex. Taking low-dose aspirin every day. Taking vitamin and mineral supplements as recommended by your health care provider. What happens during an annual well check? The services and screenings done by your health care provider during your annual well check will depend on your age, overall health, lifestyle risk factors, and family history of  disease. Counseling  Your health care provider may ask you questions about your: Alcohol use. Tobacco use. Drug use. Emotional well-being. Home and relationship well-being. Sexual activity. Eating habits. History of falls. Memory and ability to understand (cognition). Work and work Statistician. Reproductive health. Screening  You may have the following tests or measurements: Height, weight, and BMI. Blood pressure. Lipid and cholesterol levels. These may be checked every 5 years, or more frequently if you are over 12 years old. Skin check. Lung cancer screening. You may have this screening every year starting at age 1 if you have a 30-pack-year history of smoking and currently smoke or have quit within the past 15 years. Fecal occult blood test (FOBT) of the stool. You may have this test every year starting at age 74. Flexible sigmoidoscopy or colonoscopy. You may have a sigmoidoscopy every 5 years or a colonoscopy every 10 years starting at age 80. Hepatitis C blood test. Hepatitis B blood test. Sexually transmitted disease (STD) testing. Diabetes screening. This is done by checking your blood sugar (glucose) after you have not eaten for a while (fasting). You may have this done every 1-3 years. Bone density scan. This is done to screen for osteoporosis. You may have this done starting at age 73. Mammogram. This may be done every 1-2 years. Talk to your health care provider about how often you should have regular mammograms. Talk with your health care provider about your test  results, treatment options, and if necessary, the need for more tests. Vaccines  Your health care provider may recommend certain vaccines, such as: Influenza vaccine. This is recommended every year. Tetanus, diphtheria, and acellular pertussis (Tdap, Td) vaccine. You may need a Td booster every 10 years. Zoster vaccine. You may need this after age 36. Pneumococcal 13-valent conjugate (PCV13) vaccine. One  dose is recommended after age 18. Pneumococcal polysaccharide (PPSV23) vaccine. One dose is recommended after age 60. Talk to your health care provider about which screenings and vaccines you need and how often you need them. This information is not intended to replace advice given to you by your health care provider. Make sure you discuss any questions you have with your health care provider. Document Released: 11/26/2015 Document Revised: 07/19/2016 Document Reviewed: 08/31/2015 Elsevier Interactive Patient Education  2017 [employer] Prevention in the Home Falls can cause injuries. They can happen to people of all ages. There are many things you can do to make your home safe and to help prevent falls. What can I do on the outside of my home? Regularly fix the edges of walkways and driveways and fix any cracks. Remove anything that might make you trip as you walk through a door, such as a raised step or threshold. Trim any bushes or trees on the path to your home. Use bright outdoor lighting. Clear any walking paths of anything that might make someone trip, such as rocks or tools. Regularly check to see if handrails are loose or broken. Make sure that both sides of any steps have handrails. Any raised decks and porches should have guardrails on the edges. Have any leaves, snow, or ice cleared regularly. Use sand or salt on walking paths during winter. Clean up any spills in your garage right away. This includes oil or grease spills. What can I do in the bathroom? Use night lights. Install grab bars by the toilet and in the tub and shower. Do not use towel bars as grab bars. Use non-skid mats or decals in the tub or shower. If you need to sit down in the shower, use a plastic, non-slip stool. Keep the floor dry. Clean up any water that spills on the floor as soon as it happens. Remove soap buildup in the tub or shower regularly. Attach bath mats securely with double-sided  non-slip rug tape. Do not have throw rugs and other things on the floor that can make you trip. What can I do in the bedroom? Use night lights. Make sure that you have a light by your bed that is easy to reach. Do not use any sheets or blankets that are too big for your bed. They should not hang down onto the floor. Have a firm chair that has side arms. You can use this for support while you get dressed. Do not have throw rugs and other things on the floor that can make you trip. What can I do in the kitchen? Clean up any spills right away. Avoid walking on wet floors. Keep items that you use a lot in easy-to-reach places. If you need to reach something above you, use a strong step stool that has a grab bar. Keep electrical cords out of the way. Do not use floor polish or wax that makes floors slippery. If you must use wax, use non-skid floor wax. Do not have throw rugs and other things on the floor that can make you trip. What can I do with my stairs?  Do not leave any items on the stairs. Make sure that there are handrails on both sides of the stairs and use them. Fix handrails that are broken or loose. Make sure that handrails are as long as the stairways. Check any carpeting to make sure that it is firmly attached to the stairs. Fix any carpet that is loose or worn. Avoid having throw rugs at the top or bottom of the stairs. If you do have throw rugs, attach them to the floor with carpet tape. Make sure that you have a light switch at the top of the stairs and the bottom of the stairs. If you do not have them, ask someone to add them for you. What else can I do to help prevent falls? Wear shoes that: Do not have high heels. Have rubber bottoms. Are comfortable and fit you well. Are closed at the toe. Do not wear sandals. If you use a stepladder: Make sure that it is fully opened. Do not climb a closed stepladder. Make sure that both sides of the stepladder are locked into place. Ask  someone to hold it for you, if possible. Clearly mark and make sure that you can see: Any grab bars or handrails. First and last steps. Where the edge of each step is. Use tools that help you move around (mobility aids) if they are needed. These include: Canes. Walkers. Scooters. Crutches. Turn on the lights when you go into a dark area. Replace any light bulbs as soon as they burn out. Set up your furniture so you have a clear path. Avoid moving your furniture around. If any of your floors are uneven, fix them. If there are any pets around you, be aware of where they are. Review your medicines with your doctor. Some medicines can make you feel dizzy. This can increase your chance of falling. Ask your doctor what other things that you can do to help prevent falls. This information is not intended to replace advice given to you by your health care provider. Make sure you discuss any questions you have with your health care provider. Document Released: 08/26/2009 Document Revised: 04/06/2016 Document Reviewed: 12/04/2014 Elsevier Interactive Patient Education  2017 [employer].

## 2023-01-04 NOTE — Telephone Encounter (Signed)
Patient's daughter notified. She will pick it up.

## 2023-01-04 NOTE — Progress Notes (Addendum)
Subjective:   [name] [name] is a 89 y.o. female who presents for Medicare Annual (Subsequent) preventive examination.  Review of Systems     Cardiac Risk Factors include: advanced age (>80mn, >>3women);hypertension;dyslipidemia     Objective:    Today's Vitals   01/04/23 1120  BP: 138/82  Pulse: 79  Temp: 97.8 F (36.6 C)  SpO2: 99%  Weight: 141 lb 12.8 oz (64.3 kg)   Body mass index is 23.6 kg/m.     01/04/2023   11:29 AM 12/22/2021   11:45 AM 09/23/2021    1:38 PM 04/14/2021   11:10 AM 12/16/2020   11:22 AM 11/21/2019    2:02 PM 09/04/2016    3:12 PM  Advanced Directives  Does Patient Have a Medical Advance Directive? Yes Yes Yes Yes Yes Yes Yes  Type of AParamedicof AFort [place]Living will Healthcare Power of [place]Living will [place] will H[place]Living will Living will;Healthcare Power of Attorney Living will  Does patient want to make changes to medical advance directive? No - Patient declined  No - Patient declined No - Patient declined  No - Patient declined No - Patient declined  Copy of [place] Chart? Yes - validated most recent copy scanned in chart (See row information) Yes - validated most recent copy scanned in chart (See row information) No - copy requested No - copy requested No - copy requested No - copy requested No - copy requested  Would patient like information on creating a medical advance directive?   No - Patient declined        Current Medications (verified) Outpatient Encounter Medications as of 01/04/2023  Medication Sig   acetaminophen (TYLENOL) 650 MG CR tablet Take 650 mg by mouth every 8 (eight) hours as needed.   buPROPion (WELLBUTRIN XL) 150 MG 24 hr tablet Take 450 mg by mouth every morning.    carvedilol (COREG) 25 MG tablet TAKE 1 TABLET BY MOUTH TWICE A DAY WITH FOOD   diclofenac Sodium (VOLTAREN) 1 % GEL  Apply 4 g topically 4 (four) times daily. To affected joint.   furosemide (LASIX) 20 MG tablet TAKE 1 TABLET BY MOUTH EVERY OTHER DAY   GEMTESA 75 MG TABS Take 1 tablet by mouth daily.   mirtazapine (REMERON) 30 MG tablet Take 30 mg by mouth at bedtime.    Multiple Vitamin (MULTIVITAMIN) tablet Take 1 tablet by mouth every morning.   Multiple Vitamins-Minerals (BODY/HAIR/SKIN/NAILS) CAPS Take by mouth.   polyethylene glycol (MIRALAX / GLYCOLAX) packet Take 17 g by mouth 3 (three) times a week. Mon, wed, and fri   sacubitril-valsartan (ENTRESTO) 97-103 MG Take 1 tablet by mouth 2 (two) times daily.   spironolactone (ALDACTONE) 25 MG tablet TAKE 1 TABLET BY MOUTH EVERY OTHER DAY   triamcinolone cream (KENALOG) 0.1 % APPLY 1 APPLICATION TOPICALLY 2 (TWO) TIMES DAILY. FOR ITCHING AND RASH.   VYZULTA 0.024 % SOLN SMARTSIG:1 Drop(s) In Eye(s) Every Evening   [DISCONTINUED] NON FORMULARY Take 3 tablets by mouth daily. Curumin for joint pain   No facility-administered encounter medications on file as of 01/04/2023.    Allergies (verified) Shellfish allergy, Codeine, Hydrocodone, and Adhesive [tape]   History: Past Medical History:  Diagnosis Date   Anemia    Anxiety and depression 07/16/2009   ASD (atrial septal defect) per bubble study, 2004    Cardiomyopathy, normal cath 2007, normal EF per ECHO 2014  Hx of colonic polyps    Hypertension    Osteoarthritis    Osteopenia    Podagra 04/29/2014   RLS (restless legs syndrome) 07/10/2009   Past Surgical History:  Procedure Laterality Date   BACK SURGERY  2000   BREAST SURGERY     NEGATIVE BIOPSY   TONSILLECTOMY     Family History  Problem Relation Age of Onset   Heart attack Father    Heart disease Mother    Hypertension Other    Diabetes Neg Hx    Colon cancer Neg Hx    Social History   Socioeconomic History   Marital status: Widowed    Spouse name: Not on file   Number of children: 1   Years of education: Not on file    Highest education level: Not on file  Occupational History   Occupation: Motivational Speaker    Employer: RETIRED  Tobacco Use   Smoking status: Never   Smokeless tobacco: Never  Substance and Sexual Activity   Alcohol use: No    Alcohol/week: 0.0 standard drinks of alcohol   Drug use: No   Sexual activity: Not Currently  Other Topics Concern   Not on file  Social History Narrative   Widow, moved to [facility] [place]. Has a daughter Mateo Flow and a G-d in [place]; lost her son-in-law, moved w/ daughter 2014 due to depression.   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[place], [place]    Social Determinants of Health   Financial Resource Strain: Low Risk  (01/04/2023)   Overall Financial Resource Strain (CARDIA)    Difficulty of Paying Living Expenses: Not hard at all  Food Insecurity: No Food Insecurity (01/04/2023)   Hunger Vital Sign    Worried About Running Out of Food in the Last Year: Never true    [place] in the Last Year: Never true  Transportation Needs: No Transportation Needs (01/04/2023)   PRAPARE - Hydrologist (Medical): No    Lack of Transportation (Non-Medical): No  Physical Activity: Insufficiently Active (01/04/2023)   Exercise Vital Sign    Days of Exercise per Week: 5 days    Minutes of Exercise per Session: 20 min  Stress: No Stress Concern Present (01/04/2023)   [place]    Feeling of Stress : Not at all  Social Connections: Moderately Integrated (01/04/2023)   Social Connection and Isolation Panel [NHANES]    Frequency of Communication with Friends and Family: More than three times a week    Frequency of Social Gatherings with Friends and Family: More than three times a week    Attends Religious Services: More than 4 times per year    Active Member of [employer] or Organizations: Yes    Attends Archivist Meetings: 1 to 4 times per year    Marital Status: Widowed     Tobacco Counseling Counseling given: Not Answered   Clinical Intake:  Pre-visit preparation completed: Yes  Pain : No/denies pain     BMI - recorded: 23.6 Nutritional Risks: None Diabetes: No  How often do you need to have someone help you when you read instructions, pamphlets, or other written materials from your doctor or pharmacy?: 1 - Never  Diabetic?no  Interpreter Needed?: No  Information entered by :: Charlott Rakes, LPN   Activities of Daily Living    01/04/2023   11:31 AM  In your present state of health, do you  have any difficulty performing the following activities:  Hearing? 0  Vision? 0  Difficulty concentrating or making decisions? 0  Walking or climbing stairs? 0  Dressing or bathing? 0  Doing errands, shopping? 0  Preparing Food and eating ? N  Using the Toilet? N  In the past six months, have you accidently leaked urine? N  Do you have problems with loss of bowel control? N  Managing your Medications? N  Managing your Finances? N  Housekeeping or managing your Housekeeping? N    Patient Care Team: Allwardt, Randa Evens, PA-C as PCP - General (Physician Assistant) Josue Hector, MD as PCP - Cardiology (Cardiology) Norma Fredrickson, MD as Consulting Physician (Psychiatry) Josue Hector, MD as Consulting Physician (Cardiology) Juluis Rainier as Consulting Physician (Optometry)  Indicate any recent Medical Services you may have received from other than [facility] providers in the past year (date may be approximate).     Assessment:   This is a routine wellness examination for [name].  Hearing/Vision screen Hearing Screening - Comments:: Pt wears hearing aids  Vision Screening - Comments:: Pt follows with Dr Alois Cliche for annual eye exams   Dietary issues and exercise activities discussed: Current Exercise Habits: Home exercise routine, Type of exercise: stretching (recumbent bike), Time (Minutes): 20, Frequency (Times/Week): 5, Weekly  Exercise (Minutes/Week): 100   Goals Addressed             This Visit's Progress    Patient Stated       Live strong and long        Depression Screen    01/04/2023   11:26 AM 12/22/2021   12:05 PM 05/03/2021    2:26 PM 12/16/2020   11:20 AM 02/20/2020    1:50 PM 11/21/2019    2:03 PM 08/08/2019   10:00 AM  PHQ 2/9 Scores  PHQ - 2 Score 0 0 0 0 0 0 0  PHQ- 9 Score       0    Fall Risk    01/04/2023   11:30 AM 12/22/2021   12:08 PM 06/21/2021    9:22 AM 05/03/2021    2:26 PM 12/16/2020   11:23 AM  [place] in the past year? 0 0 0 0 0  Number falls in past yr: 0 0   0  Injury with Fall? 0 0   0  Risk for fall due to : Impaired vision;Impaired balance/gait Impaired vision No Fall Risks  Impaired vision  Follow up Falls prevention discussed Falls prevention discussed   Falls prevention discussed    FALL RISK PREVENTION PERTAINING TO THE HOME:  Any stairs in or around the home? Yes  If so, are there any without handrails? No  Home free of loose throw rugs in walkways, pet beds, electrical cords, etc? Yes  Adequate lighting in your home to reduce risk of falls? Yes   ASSISTIVE DEVICES UTILIZED TO PREVENT FALLS:  Life alert? Yes  Use of a cane, walker or w/c? Yes  Grab bars in the bathroom? Yes  Shower chair or bench in shower? No  Elevated toilet seat or a handicapped toilet? No   TIMED UP AND GO:  Was the test performed? Yes .  Length of time to ambulate 10 feet: 10 sec.   Gait steady and fast with assistive device  Cognitive Function:        01/04/2023   11:36 AM 12/22/2021   12:11 PM 12/16/2020   11:35 AM  11/21/2019    2:03 PM  6CIT Screen  What Year?  0 points 0 points 0 points  What month?  0 points 0 points 0 points  What time?  0 points  0 points  Count back from 20  0 points 0 points 0 points  Months in reverse  0 points 0 points 0 points  Repeat phrase 6 points 0 points 0 points 0 points  Total Score  0 points  0 points     Immunizations Immunization History  Administered Date(s) Administered   COVID-19, mRNA, vaccine(Comirnaty)12 years and older 09/08/2022   Fluad Quad(high Dose 65+) 09/13/2020   Influenza Split 09/04/2011, 09/02/2012, 07/18/2015   Influenza Whole 10/21/2007, 08/03/2008, 08/12/2009, 08/03/2010   Influenza, High Dose Seasonal PF 07/18/2016, 08/08/2017, 07/08/2018, 08/05/2019, 09/13/2020, 08/21/2022   Influenza,inj,Quad PF,6+ Mos 09/11/2013, 08/15/2014   Influenza-Unspecified 08/09/2017, 07/08/2018, 09/08/2021   PFIZER Comirnaty(Gray Top)Covid-19 Tri-Sucrose Vaccine 03/03/2021   PFIZER(Purple Top)SARS-COV-2 Vaccination 12/25/2019, 01/19/2020, 08/21/2020   Pfizer Covid-19 Vaccine Bivalent Booster 5y-11y 08/23/2021, 05/11/2022   Pneumococcal Conjugate-13 03/10/2014   Pneumococcal Polysaccharide-23 12/11/2006, 10/02/2017   Td 12/22/2006, 03/20/2017   Zoster Recombinat (Shingrix) 01/26/2018, 06/10/2018   Zoster, Live 03/09/2008    TDAP status: Up to date  Flu Vaccine status: Up to date  Pneumococcal vaccine status: Up to date  Covid-19 vaccine status: Completed vaccines  Qualifies for Shingles Vaccine? Yes   Zostavax completed Yes   Shingrix Completed?: Yes  Screening Tests Health Maintenance  Topic Date Due   MAMMOGRAM  05/26/2023   Medicare Annual Wellness (AWV)  01/05/2024   DTaP/Tdap/Td (3 - Tdap) 03/21/2027   Pneumonia Vaccine 33+ Years old  Completed   INFLUENZA VACCINE  Completed   DEXA SCAN  Completed   COVID-19 Vaccine  Completed   Zoster Vaccines- Shingrix  Completed   HPV VACCINES  Aged Out    Health Maintenance  There are no preventive care reminders to display for this patient.   Colorectal cancer screening: No longer required.   Mammogram status: Completed 05/25/22. Repeat every year  Bone Density status: Completed 04/11/18. Results reflect: Bone density results: OSTEOPENIA. Repeat every 2 years.   Additional Screening:   Vision Screening:  Recommended annual ophthalmology exams for early detection of glaucoma and other disorders of the eye. Is the patient up to date with their annual eye exam?  Yes  Who is the provider or what is the name of the office in which the patient attends annual eye exams? Dr Alois Cliche  If pt is not established with a provider, would they like to be referred to a provider to establish care? No .   Dental Screening: Recommended annual dental exams for proper oral hygiene  Community Resource Referral / Chronic Care Management: CRR required this visit?  No   CCM required this visit?  No      Plan:     I have personally reviewed and noted the following in the patient's chart:   Medical and social history Use of alcohol, tobacco or illicit drugs  Current medications and supplements including opioid prescriptions. Patient is not currently taking opioid prescriptions. Functional ability and status Nutritional status Physical activity Advanced directives List of other physicians Hospitalizations, surgeries, and ER visits in previous 12 months Vitals Screenings to include cognitive, depression, and falls Referrals and appointments  In addition, I have reviewed and discussed with patient certain preventive protocols, quality metrics, and best practice recommendations. A written personalized care plan for preventive services as well as general  preventive health recommendations were provided to patient.     Willette Brace, LPN   [id number]   Nurse Notes: none

## 2023-01-08 NOTE — Progress Notes (Signed)
Subjective:   [name] [name] is a 89 y.o. female who presents for Medicare Annual (Subsequent) preventive examination.  Review of Systems     Cardiac Risk Factors include: advanced age (>83mn, >>24women);hypertension;dyslipidemia     Objective:    Today's Vitals   01/04/23 1120  BP: 138/82  Pulse: 79  Temp: 97.8 F (36.6 C)  SpO2: 99%  Weight: 141 lb 12.8 oz (64.3 kg)   Body mass index is 23.6 kg/m.     01/04/2023   11:29 AM 12/22/2021   11:45 AM 09/23/2021    1:38 PM 04/14/2021   11:10 AM 12/16/2020   11:22 AM 11/21/2019    2:02 PM 09/04/2016    3:12 PM  Advanced Directives  Does Patient Have a Medical Advance Directive? Yes Yes Yes Yes Yes Yes Yes  Type of AParamedicof [place] will Healthcare Power of [place] RanchoLiving will H[place]te Creek CanyonLiving will H[place]end[place]Living will Living will;Healthcare Power of Attorney Living will  Does patient want to make changes to medical advance directive? No - Patient declined  No - Patient declined No - Patient declined  No - Patient declined No - Patient declined  Copy of HV[place] Chart? Yes - validated most recent copy scanned in chart (See row information) Yes - validated most recent copy scanned in chart (See row information) No - copy requested No - copy requested No - copy requested No - copy requested No - copy requested  Would patient like information on creating a medical advance directive?   No - Patient declined        Current Medications (verified) Outpatient Encounter Medications as of 01/04/2023  Medication Sig   acetaminophen (TYLENOL) 650 MG CR tablet Take 650 mg by mouth every 8 (eight) hours as needed.   buPROPion (WELLBUTRIN XL) 150 MG 24 hr tablet Take 450 mg by mouth every morning.    carvedilol (COREG) 25 MG tablet TAKE 1 TABLET BY MOUTH TWICE A DAY WITH FOOD   diclofenac Sodium (VOLTAREN) 1 % GEL  Apply 4 g topically 4 (four) times daily. To affected joint.   furosemide (LASIX) 20 MG tablet TAKE 1 TABLET BY MOUTH EVERY OTHER DAY   GEMTESA 75 MG TABS Take 1 tablet by mouth daily.   mirtazapine (REMERON) 30 MG tablet Take 30 mg by mouth at bedtime.    Multiple Vitamin (MULTIVITAMIN) tablet Take 1 tablet by mouth every morning.   Multiple Vitamins-Minerals (BODY/HAIR/SKIN/NAILS) CAPS Take by mouth.   polyethylene glycol (MIRALAX / GLYCOLAX) packet Take 17 g by mouth 3 (three) times a week. Mon, wed, and fri   sacubitril-valsartan (ENTRESTO) 97-103 MG Take 1 tablet by mouth 2 (two) times daily.   spironolactone (ALDACTONE) 25 MG tablet TAKE 1 TABLET BY MOUTH EVERY OTHER DAY   triamcinolone cream (KENALOG) 0.1 % APPLY 1 APPLICATION TOPICALLY 2 (TWO) TIMES DAILY. FOR ITCHING AND RASH.   VYZULTA 0.024 % SOLN SMARTSIG:1 Drop(s) In Eye(s) Every Evening   [DISCONTINUED] NON FORMULARY Take 3 tablets by mouth daily. Curumin for joint pain   No facility-administered encounter medications on file as of 01/04/2023.    Allergies (verified) Shellfish allergy, Codeine, Hydrocodone, and Adhesive [tape]   History: Past Medical History:  Diagnosis Date   Anemia    Anxiety and depression 07/16/2009   ASD (atrial septal defect) per bubble study, 2004    Cardiomyopathy, normal cath 2007, normal EF per ECHO 2014  Hx of colonic polyps    Hypertension    Osteoarthritis    Osteopenia    Podagra 04/29/2014   RLS (restless legs syndrome) 07/10/2009   Past Surgical History:  Procedure Laterality Date   BACK SURGERY  2000   BREAST SURGERY     NEGATIVE BIOPSY   TONSILLECTOMY     Family History  Problem Relation Age of Onset   Heart attack Father    Heart disease Mother    Hypertension Other    Diabetes Neg Hx    Colon cancer Neg Hx    Social History   Socioeconomic History   Marital status: Widowed    Spouse name: Not on file   Number of children: 1   Years of education: Not on file    Highest education level: Not on file  Occupational History   Occupation: Motivational Speaker    Employer: RETIRED  Tobacco Use   Smoking status: Never   Smokeless tobacco: Never  Substance and Sexual Activity   Alcohol use: No    Alcohol/week: 0.0 standard drinks of alcohol   Drug use: No   Sexual activity: Not Currently  Other Topics Concern   Not on file  Social History Narrative   Widow, moved to [facility] [place]. Has a daughter Mateo Flow and a G-d in [place]; lost her son-in-law, moved w/ daughter 2014 due to depression.   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[place], [place]    Social Determinants of Health   Financial Resource Strain: Low Risk  (01/04/2023)   Overall Financial Resource Strain (CARDIA)    Difficulty of Paying Living Expenses: Not hard at all  Food Insecurity: No Food Insecurity (01/04/2023)   Hunger Vital Sign    Worried About Running Out of Food in the Last Year: Never true    [place] in the Last Year: Never true  Transportation Needs: No Transportation Needs (01/04/2023)   PRAPARE - Hydrologist (Medical): No    Lack of Transportation (Non-Medical): No  Physical Activity: Insufficiently Active (01/04/2023)   Exercise Vital Sign    Days of Exercise per Week: 5 days    Minutes of Exercise per Session: 20 min  Stress: No Stress Concern Present (01/04/2023)   [place]    Feeling of Stress : Not at all  Social Connections: Moderately Integrated (01/04/2023)   Social Connection and Isolation Panel [NHANES]    Frequency of Communication with Friends and Family: More than three times a week    Frequency of Social Gatherings with Friends and Family: More than three times a week    Attends Religious Services: More than 4 times per year    Active Member of [employer] or Organizations: Yes    Attends Archivist Meetings: 1 to 4 times per year    Marital Status: Widowed     Tobacco Counseling Counseling given: Not Answered   Clinical Intake:  Pre-visit preparation completed: Yes  Pain : No/denies pain     BMI - recorded: 23.6 Nutritional Risks: None Diabetes: No  How often do you need to have someone help you when you read instructions, pamphlets, or other written materials from your doctor or pharmacy?: 1 - Never  Diabetic?no  Interpreter Needed?: No  Information entered by :: Charlott Rakes, LPN   Activities of Daily Living    01/04/2023   11:31 AM  In your present state of health, do you  have any difficulty performing the following activities:  Hearing? 0  Vision? 0  Difficulty concentrating or making decisions? 0  Walking or climbing stairs? 0  Dressing or bathing? 0  Doing errands, shopping? 0  Preparing Food and eating ? N  Using the Toilet? N  In the past six months, have you accidently leaked urine? N  Do you have problems with loss of bowel control? N  Managing your Medications? N  Managing your Finances? N  Housekeeping or managing your Housekeeping? N    Patient Care Team: Allwardt, Randa Evens, PA-C as PCP - General (Physician Assistant) Josue Hector, MD as PCP - Cardiology (Cardiology) Norma Fredrickson, MD as Consulting Physician (Psychiatry) Josue Hector, MD as Consulting Physician (Cardiology) Juluis Rainier as Consulting Physician (Optometry)  Indicate any recent Medical Services you may have received from other than [facility] providers in the past year (date may be approximate).     Assessment:   This is a routine wellness examination for [name].  Hearing/Vision screen Hearing Screening - Comments:: Pt wears hearing aids  Vision Screening - Comments:: Pt follows with Dr Alois Cliche for annual eye exams   Dietary issues and exercise activities discussed: Current Exercise Habits: Home exercise routine, Type of exercise: stretching (recumbent bike), Time (Minutes): 20, Frequency (Times/Week): 5, Weekly  Exercise (Minutes/Week): 100   Goals Addressed             This Visit's Progress    Patient Stated       Live strong and long       Depression Screen    01/04/2023   11:26 AM 12/22/2021   12:05 PM 05/03/2021    2:26 PM 12/16/2020   11:20 AM 02/20/2020    1:50 PM 11/21/2019    2:03 PM 08/08/2019   10:00 AM  PHQ 2/9 Scores  PHQ - 2 Score 0 0 0 0 0 0 0  PHQ- 9 Score       0    Fall Risk    01/04/2023   11:30 AM 12/22/2021   12:08 PM 06/21/2021    9:22 AM 05/03/2021    2:26 PM 12/16/2020   11:23 AM  [place] in the past year? 0 0 0 0 0  Number falls in past yr: 0 0   0  Injury with Fall? 0 0   0  Risk for fall due to : Impaired vision;Impaired balance/gait Impaired vision No Fall Risks  Impaired vision  Follow up Falls prevention discussed Falls prevention discussed   Falls prevention discussed    FALL RISK PREVENTION PERTAINING TO THE HOME:  Any stairs in or around the home? Yes  If so, are there any without handrails? No  Home free of loose throw rugs in walkways, pet beds, electrical cords, etc? Yes  Adequate lighting in your home to reduce risk of falls? Yes   ASSISTIVE DEVICES UTILIZED TO PREVENT FALLS:  Life alert? Yes  Use of a cane, walker or w/c? Yes  Grab bars in the bathroom? Yes  Shower chair or bench in shower? No  Elevated toilet seat or a handicapped toilet? No   TIMED UP AND GO:  Was the test performed? Yes .  Length of time to ambulate 10 feet: 10 sec.   Gait steady and fast with assistive device  Cognitive Function:        01/04/2023   11:36 AM 12/22/2021   12:11 PM 12/16/2020   11:35 AM 11/21/2019  2:03 PM  6CIT Screen  What Year? 0 points 0 points 0 points 0 points  What month? 0 points 0 points 0 points 0 points  What time? 0 points 0 points  0 points  Count back from 20 0 points 0 points 0 points 0 points  Months in reverse 0 points 0 points 0 points 0 points  Repeat phrase 6 points 0 points 0 points 0 points  Total Score 6 points 0  points  0 points    Immunizations Immunization History  Administered Date(s) Administered   COVID-19, mRNA, vaccine(Comirnaty)12 years and older 09/08/2022   Fluad Quad(high Dose 65+) 09/13/2020   Influenza Split 09/04/2011, 09/02/2012, 07/18/2015   Influenza Whole 10/21/2007, 08/03/2008, 08/12/2009, 08/03/2010   Influenza, High Dose Seasonal PF 07/18/2016, 08/08/2017, 07/08/2018, 08/05/2019, 09/13/2020, 08/21/2022   Influenza,inj,Quad PF,6+ Mos 09/11/2013, 08/15/2014   Influenza-Unspecified 08/09/2017, 07/08/2018, 09/08/2021   PFIZER Comirnaty(Gray Top)Covid-19 Tri-Sucrose Vaccine 03/03/2021   PFIZER(Purple Top)SARS-COV-2 Vaccination 12/25/2019, 01/19/2020, 08/21/2020   Pfizer Covid-19 Vaccine Bivalent Booster 5y-11y 08/23/2021, 05/11/2022   Pneumococcal Conjugate-13 03/10/2014   Pneumococcal Polysaccharide-23 12/11/2006, 10/02/2017   Td 12/22/2006, 03/20/2017   Zoster Recombinat (Shingrix) 01/26/2018, 06/10/2018   Zoster, Live 03/09/2008    TDAP status: Up to date  Flu Vaccine status: Up to date  Pneumococcal vaccine status: Up to date  Covid-19 vaccine status: Completed vaccines  Qualifies for Shingles Vaccine? Yes   Zostavax completed Yes   Shingrix Completed?: Yes  Screening Tests Health Maintenance  Topic Date Due   MAMMOGRAM  05/26/2023   Medicare Annual Wellness (AWV)  01/05/2024   DTaP/Tdap/Td (3 - Tdap) 03/21/2027   Pneumonia Vaccine 65+ Years old  Completed   INFLUENZA VACCINE  Completed   DEXA SCAN  Completed   COVID-19 Vaccine  Completed   Zoster Vaccines- Shingrix  Completed   HPV VACCINES  Aged Out    Health Maintenance  There are no preventive care reminders to display for this patient.   Colorectal cancer screening: No longer required.   Mammogram status: Completed 05/25/22. Repeat every year  Bone Density status: Completed 04/11/18. Results reflect: Bone density results: OSTEOPENIA. Repeat every 2 years.   Additional Screening:   Vision  Screening: Recommended annual ophthalmology exams for early detection of glaucoma and other disorders of the eye. Is the patient up to date with their annual eye exam?  Yes  Who is the provider or what is the name of the office in which the patient attends annual eye exams? Dr Alois Cliche  If pt is not established with a provider, would they like to be referred to a provider to establish care? No .   Dental Screening: Recommended annual dental exams for proper oral hygiene  Community Resource Referral / Chronic Care Management: CRR required this visit?  No   CCM required this visit?  No      Plan:     I have personally reviewed and noted the following in the patient's chart:   Medical and social history Use of alcohol, tobacco or illicit drugs  Current medications and supplements including opioid prescriptions. Patient is not currently taking opioid prescriptions. Functional ability and status Nutritional status Physical activity Advanced directives List of other physicians Hospitalizations, surgeries, and ER visits in previous 12 months Vitals Screenings to include cognitive, depression, and falls Referrals and appointments  In addition, I have reviewed and discussed with patient certain preventive protocols, quality metrics, and best practice recommendations. A written personalized care plan for preventive services as well  as general preventive health recommendations were provided to patient.     Willette Brace, LPN   [id number]   Nurse Notes: none

## 2023-01-09 ENCOUNTER — Telehealth: Payer: Self-pay | Admitting: Orthopaedic Surgery

## 2023-01-09 NOTE — Telephone Encounter (Signed)
Received call from Annamaria Boots daughter. She is requesting the knee xray from 08/15/22 to CD. Would like to pick up tomorrow. Heriberto Antigua callback [phone]

## 2023-01-09 NOTE — Telephone Encounter (Signed)
Left message on daughters phone advising CD was ready for pickup at front desk.

## 2023-01-10 ENCOUNTER — Ambulatory Visit: Payer: Medicare PPO | Admitting: Orthopaedic Surgery

## 2023-01-10 ENCOUNTER — Encounter: Payer: Self-pay | Admitting: Orthopaedic Surgery

## 2023-01-10 DIAGNOSIS — M1711 Unilateral primary osteoarthritis, right knee: Secondary | ICD-10-CM | POA: Diagnosis not present

## 2023-01-10 MED ORDER — CELECOXIB 200 MG PO CAPS: 200.0000 mg | ORAL_CAPSULE | Freq: Two times a day (BID) | ORAL | 1 refills | Status: DC | PRN

## 2023-01-10 NOTE — Progress Notes (Signed)
The patient is a very pleasant 89 year old female who comes to me for second opinion as a relates to her right knee and the need for knee replacement surgery.  This was actually recommended from her cardiologist Dr. Jenkins Rouge who is actually cleared her for surgery.  Her daughter is with her today and she does live with her daughter.  She has significant bone-on-bone wear this been well-documented of that right knee as well as significant valgus malalignment.  She did see my partner Dr. Erlinda Hong who deferred surgery given the patient's advanced age and comorbidities.  The patient states that her quality of life is miserable now as a result of her pain and deformity being so severe with her right knee.  She has had multiple injections over the past and these have gotten to where they are not helping at all.  She is really interested in having her knee replaced at this point and her daughter agrees with this as well as is her cardiologist.  She currently denies any headache, chest pain, shortness of breath, fever, chills, nausea, vomiting.  I was able to review all of her medications within epic as well as her past medical history.  She is not on any blood thinning medications and is not a diabetic.  On exam she does have significant valgus malalignment of her right knee with limited motion.  Even when she stands she has significant deformity in that knee when she stands.  There is a moderate effusion with her knee and pain throughout the arc of motion.  Her x-rays show complete loss of joint space of the right knee with bone-on-bone wear of all 3 compartments as well as significant valgus malalignment.  We had a long and thorough discussion about knee replacement surgery.  She understands that she is at a heightened risk of complications given her advanced age however all her medical issues are stable.  She understands this is a quality-of-life issue and our goal is to decrease her pain, hopefully improve her  mobility and her quality of life as well as her actives of daily living.  But I went over knee replacement surgery in detail.  I showed them a knee replacement model and discussed all of the risk and benefits of the surgery and what to expect from an intraoperative and postoperative standpoint.  We will work on getting this scheduled.  All questions and concerns were addressed and answered.                                                              ----------------------                                                                                                                                                                       +-------------------

## 2023-01-15 ENCOUNTER — Telehealth: Payer: Self-pay | Admitting: Orthopaedic Surgery

## 2023-01-15 NOTE — Telephone Encounter (Signed)
I called and answered questions. Pts daughter would like to know when they will be called to schedule sx. Please advise

## 2023-01-15 NOTE — Telephone Encounter (Signed)
Patients daughter is asking to speak with a technician about her mothers mobility after surgery. Please call --[phone] Heriberto Antigua is her daughters name.Marland Kitchen

## 2023-01-18 NOTE — Telephone Encounter (Signed)
I called daughter and scheduled surgery.

## 2023-01-22 ENCOUNTER — Telehealth: Payer: Self-pay | Admitting: Physician Assistant

## 2023-01-22 NOTE — Telephone Encounter (Signed)
Please see pt msg and advise if ok to place future lab orders

## 2023-01-22 NOTE — Telephone Encounter (Signed)
Caller states: - Pt has been scheduled for total right knee replacement on 03/22/23 w/ Dr. Ninfa Linden, [place] - Started taking celebrex consistently last week as prescribed by Dr. Ninfa Linden; no longer taking tylenol - Orthopedics said while on celebrex potassium needs to be checked regularly by PCP   Please Advise.

## 2023-01-23 NOTE — Telephone Encounter (Signed)
Returned pt call and advised pt is more than welcome to schedule pre op appt before surgery on 03/22/23 or if she is just wanting a regular check up with labs before we can also do this as well. Pt advised will have daughter call the front office to schedule an appt with fasting labs.

## 2023-01-23 NOTE — Telephone Encounter (Signed)
Patient is scheduled for pre op OV and Fasting labs 02/01/23

## 2023-01-24 ENCOUNTER — Other Ambulatory Visit: Payer: Self-pay

## 2023-02-01 ENCOUNTER — Ambulatory Visit: Payer: Medicare PPO | Admitting: Physician Assistant

## 2023-02-01 ENCOUNTER — Encounter: Payer: Self-pay | Admitting: Physician Assistant

## 2023-02-01 VITALS — BP 148/80 | HR 73 | Temp 97.6°F | Ht 65.0 in | Wt 140.6 lb

## 2023-02-01 DIAGNOSIS — R7303 Prediabetes: Secondary | ICD-10-CM

## 2023-02-01 DIAGNOSIS — I10 Essential (primary) hypertension: Secondary | ICD-10-CM

## 2023-02-01 DIAGNOSIS — Z01818 Encounter for other preprocedural examination: Secondary | ICD-10-CM | POA: Diagnosis not present

## 2023-02-01 DIAGNOSIS — G8929 Other chronic pain: Secondary | ICD-10-CM

## 2023-02-01 DIAGNOSIS — M25561 Pain in right knee: Secondary | ICD-10-CM | POA: Diagnosis not present

## 2023-02-01 DIAGNOSIS — E785 Hyperlipidemia, unspecified: Secondary | ICD-10-CM

## 2023-02-01 LAB — LIPID PANEL
Cholesterol: 215 mg/dL — ABNORMAL HIGH (ref 0–200)
HDL: 77.6 mg/dL (ref >39.00)
LDL Cholesterol: 120 mg/dL — ABNORMAL HIGH (ref 0–99)
NonHDL: 137.32
Total CHOL/HDL Ratio: 3
Triglycerides: 86 mg/dL (ref 0.0–149.0)
VLDL: 17.2 mg/dL (ref 0.0–40.0)

## 2023-02-01 LAB — COMPREHENSIVE METABOLIC PANEL
ALT: 7 U/L (ref 0–35)
AST: 14 U/L (ref 0–37)
Albumin: 4.5 g/dL (ref 3.5–5.2)
Alkaline Phosphatase: 40 U/L (ref 39–117)
BUN: 29 mg/dL — ABNORMAL HIGH (ref 6–23)
CO2: 25 mEq/L (ref 19–32)
Calcium: 10 mg/dL (ref 8.4–10.5)
Chloride: 103 mEq/L (ref 96–112)
Creatinine, Ser: 1.48 mg/dL — ABNORMAL HIGH (ref 0.40–1.20)
GFR: 30.32 mL/min — ABNORMAL LOW (ref >60.00)
Glucose, Bld: 96 mg/dL (ref 70–99)
Potassium: 5.1 mEq/L (ref 3.5–5.1)
Sodium: 137 mEq/L (ref 135–145)
Total Bilirubin: 0.4 mg/dL (ref 0.2–1.2)
Total Protein: 6.9 g/dL (ref 6.0–8.3)

## 2023-02-01 LAB — CBC WITH DIFFERENTIAL/PLATELET
Basophils Absolute: 0.1 10*3/uL (ref 0.0–0.1)
Basophils Relative: 0.9 % (ref 0.0–3.0)
Eosinophils Absolute: 0.1 10*3/uL (ref 0.0–0.7)
Eosinophils Relative: 1 % (ref 0.0–5.0)
HCT: 34.7 % — ABNORMAL LOW (ref 36.0–46.0)
Hemoglobin: 11.7 g/dL — ABNORMAL LOW (ref 12.0–15.0)
Lymphocytes Relative: 27.8 % (ref 12.0–46.0)
Lymphs Abs: 1.5 10*3/uL (ref 0.7–4.0)
MCHC: 33.6 g/dL (ref 30.0–36.0)
MCV: 101.4 fl — ABNORMAL HIGH (ref 78.0–100.0)
Monocytes Absolute: 0.4 10*3/uL (ref 0.1–1.0)
Monocytes Relative: 8.1 % (ref 3.0–12.0)
Neutro Abs: 3.4 10*3/uL (ref 1.4–7.7)
Neutrophils Relative %: 62.2 % (ref 43.0–77.0)
Platelets: 231 10*3/uL (ref 150.0–400.0)
RBC: 3.43 Mil/uL — ABNORMAL LOW (ref 3.87–5.11)
RDW: 13.8 % (ref 11.5–15.5)
WBC: 5.5 10*3/uL (ref 4.0–10.5)

## 2023-02-01 LAB — TSH: TSH: 2.53 u[IU]/mL (ref 0.35–5.50)

## 2023-02-01 LAB — HEMOGLOBIN A1C: Hgb A1c MFr Bld: 5.8 % (ref 4.6–6.5)

## 2023-02-01 NOTE — Patient Instructions (Signed)
Best wishes on your operation! We'll be praying! Monitor BP readings at home. Labs today.

## 2023-02-01 NOTE — Progress Notes (Signed)
Subjective:    Patient ID: [name] [name], female    DOB: 05-Nov-1927, 89 y.o.   MRN: [id number]  Chief Complaint  Patient presents with   Pre-op Exam    Pt in office for Pre-op exam and fasting labs; pt is having right knee replacement done on May 9th.     HPI Patient is in today for preop eval - R knee TKA with Dr. Ninfa Linden scheduled for 03/22/23. Here with her daughter.  She is very excited about her upcoming operation.  Sleeping well. Appetite is good / normal per patient.  No CP, SOB at rest, nor with exertion; she iswalking with walker and doing well she says. No headaches or dizziness. No lightheadedness. No other symptoms or concerns.  No major medical changes or hospitalizations prn interim.  Past Medical History:  Diagnosis Date   Anemia    Anxiety and depression 07/16/2009   ASD (atrial septal defect) per bubble study, 2004    Cardiomyopathy, normal cath 2007, normal EF per ECHO 2014    Hx of colonic polyps    Hypertension    Osteoarthritis    Osteopenia    Podagra 04/29/2014   RLS (restless legs syndrome) 07/10/2009    Past Surgical History:  Procedure Laterality Date   BACK SURGERY  2000   BREAST SURGERY     NEGATIVE BIOPSY   TONSILLECTOMY      Family History  Problem Relation Age of Onset   Heart attack Father    Heart disease Mother    Hypertension Other    Diabetes Neg Hx    Colon cancer Neg Hx     Social History   Tobacco Use   Smoking status: Never   Smokeless tobacco: Never  Substance Use Topics   Alcohol use: No    Alcohol/week: 0.0 standard drinks of alcohol   Drug use: No     Allergies  Allergen Reactions   Shellfish Allergy Anaphylaxis   Codeine Other (See Comments)    Unknown.   Hydrocodone Other (See Comments)    Unknown.   Adhesive [Tape] Rash    Review of Systems NEGATIVE UNLESS OTHERWISE INDICATED IN HPI      Objective:     BP (!) 148/80   Pulse 73   Temp 97.6 F (36.4 C) (Oral)   Ht 5\' 5"  (1.651 m)   Wt  140 lb 9.6 oz (63.8 kg)   LMP  (LMP Unknown)   SpO2 94%   BMI 23.40 kg/m   Wt Readings from Last 3 Encounters:  02/01/23 140 lb 9.6 oz (63.8 kg)  01/04/23 141 lb 12.8 oz (64.3 kg)  12/21/22 141 lb (64 kg)    BP Readings from Last 3 Encounters:  02/01/23 (!) 148/80  01/04/23 138/82  12/21/22 126/80     Physical Exam Vitals and nursing note reviewed.  Constitutional:      Appearance: Normal appearance.     Comments: Using a walker  Eyes:     Extraocular Movements: Extraocular movements intact.     Conjunctiva/sclera: Conjunctivae normal.     Pupils: Pupils are equal, round, and reactive to light.  Cardiovascular:     Rate and Rhythm: Normal rate and regular rhythm.     Pulses: Normal pulses.  Pulmonary:     Effort: Pulmonary effort is normal.     Breath sounds: Normal breath sounds. No wheezing.  Neurological:     General: No focal deficit present.     Mental Status: She is  alert and oriented to person, place, and time.  Psychiatric:        Mood and Affect: Mood normal.        Behavior: Behavior normal.        Assessment & Plan:  Pre-op evaluation -     CBC with Differential/Platelet -     Comprehensive metabolic panel -     Lipid panel -     TSH  Primary hypertension  Hyperlipidemia, unspecified hyperlipidemia type  Prediabetes -     Hemoglobin A1c  Chronic pain of right knee   Patient has already talked with her cardiologist, Dr. Johnsie Cancel, in regard to her upcoming surgery. He and Dr. Ninfa Linden agree she is a good candidate for surgery despite her age. Plan to check labs today for her. Her blood pressure is stable. Her prediabetes has been stable. No red flags on exam to prevent her from surgery. Pt aware of all risks vs benefits.   Her RCRI risk score is 0 points.    Return in about 6 months (around 08/04/2023) for 6 month regular recheck.     Elia Nunley M Shaena Parkerson, PA-C

## 2023-02-05 DIAGNOSIS — N3941 Urge incontinence: Secondary | ICD-10-CM | POA: Diagnosis not present

## 2023-02-26 ENCOUNTER — Other Ambulatory Visit (INDEPENDENT_AMBULATORY_CARE_PROVIDER_SITE_OTHER): Payer: Medicare PPO

## 2023-02-26 ENCOUNTER — Ambulatory Visit: Payer: Medicare PPO | Admitting: Orthopaedic Surgery

## 2023-02-26 DIAGNOSIS — M5441 Lumbago with sciatica, right side: Secondary | ICD-10-CM | POA: Diagnosis not present

## 2023-02-26 MED ORDER — METHOCARBAMOL 500 MG PO TABS: 500.0000 mg | ORAL_TABLET | Freq: Four times a day (QID) | ORAL | 1 refills | Status: DC | PRN

## 2023-02-26 MED ORDER — PREDNISONE 20 MG PO TABS: 20.0000 mg | ORAL_TABLET | Freq: Every day | ORAL | 0 refills | Status: DC

## 2023-02-26 NOTE — Progress Notes (Signed)
  The patient is a 89 year old female who we actually have scheduled for a right knee replacement on May 9.  Over the weekend she started having low back pain with radicular symptoms to the right side.  She has remote history of back surgery done several decades ago.  There is no instrumentation or hardware in her spine.  She does ambulate with a walker.  Her daughter gave her 1 dose of methocarbamol that did help.  She denies any change in bowel or bladder function.  On exam she is hurting in the lower aspect of her lumbar spine to the right side.  She does not have a true positive straight leg raise but a lot of the pain she has in her right lower extremity is due to significant valgus malalignment and severe arthritis of that right knee.  It is bone-on-bone and again we have her scheduled for surgery coming up in the near future.  X-rays of the lumbar spine show no acute findings with some degenerative changes especially between L4 and L5 and L5 and S1.  There may be slight compression deformity of the L5 vertebral body but it does not appear acute.  I would like to put her on 20 mg of prednisone daily for the next 5 days.  We will also send in some methocarbamol.  If she is rapidly worsening or not getting better she will call and let us know because I would like to obtain an MRI of her lumbar spine to rule out nerve compression or an acute compression fracture.

## 2023-03-01 ENCOUNTER — Other Ambulatory Visit: Payer: Self-pay | Admitting: Physician Assistant

## 2023-03-01 ENCOUNTER — Telehealth: Payer: Self-pay | Admitting: *Deleted

## 2023-03-01 DIAGNOSIS — I10 Essential (primary) hypertension: Secondary | ICD-10-CM

## 2023-03-01 NOTE — Telephone Encounter (Signed)
Patient's daughter called asking a question about herself as I am very familiar with this family b/c I followed the daughter through both hip replacements with Dr. Roda Shutters. This patient is a bundle as well. She let me know the Prednisone and Robaxin seem to be helping the low back pain some for her mother. Wanted you to know.

## 2023-03-08 ENCOUNTER — Ambulatory Visit: Payer: Medicare PPO | Admitting: Family Medicine

## 2023-03-09 NOTE — Patient Instructions (Signed)
SURGICAL WAITING ROOM VISITATION  Patients having surgery or a procedure may have no more than 2 support people in the waiting area - these visitors may rotate.    Children under the age of 49 must have an adult with them who is not the patient.  Due to an increase in RSV and influenza rates and associated hospitalizations, children ages 40 and under may not visit patients in [facility] hospitals.  If the patient needs to stay at the hospital during part of their recovery, the visitor guidelines for inpatient rooms apply. Pre-op nurse will coordinate an appropriate time for 1 support person to accompany patient in pre-op.  This support person may not rotate.    Please refer to the [facility] website for the visitor guidelines for Inpatients (after your surgery is over and you are in a regular room).       Your procedure is scheduled on:  03/22/2023    Report to [facility] Main Entrance    Report to admitting at 0730 AM   Call this number if you have problems the morning of surgery [phone]   Do not eat food or drink liquids  :After Midnight.                                If you have questions, please contact your surgeon's office.       Oral Hygiene is also important to reduce your risk of infection.                                    Remember - BRUSH YOUR TEETH THE MORNING OF SURGERY WITH YOUR REGULAR TOOTHPASTE  DENTURES WILL BE REMOVED PRIOR TO SURGERY PLEASE DO NOT APPLY "Poly grip" OR ADHESIVES!!!   Do NOT smoke after Midnight   Take these medicines the morning of surgery with A SIP OF WATER:  wellbutrink coreg, gemtesa   DO NOT TAKE ANY ORAL DIABETIC MEDICATIONS DAY OF YOUR SURGERY  Bring CPAP mask and tubing day of surgery.                              You may not have any metal on your body including hair pins, jewelry, and body piercing             Do not wear make-up, lotions, powders, perfumes/cologne, or deodorant  Do not wear nail  polish including gel and S&S, artificial/acrylic nails, or any other type of covering on natural nails including finger and toenails. If you have artificial nails, gel coating, etc. that needs to be removed by a nail salon please have this removed prior to surgery or surgery may need to be canceled/ delayed if the surgeon/ anesthesia feels like they are unable to be safely monitored.   Do not shave  48 hours prior to surgery.               Men may shave face and neck.   Do not bring valuables to the hospital. [facility] IS NOT             RESPONSIBLE   FOR VALUABLES.   Contacts, glasses, dentures or bridgework may not be worn into surgery.   Bring small overnight bag day of surgery.   DO NOT [facility] HOME MEDICATIONS  TO THE HOSPITAL. PHARMACY WILL DISPENSE MEDICATIONS LISTED ON YOUR MEDICATION LIST TO YOU DURING YOUR ADMISSION IN THE HOSPITAL!    Patients discharged on the day of surgery will not be allowed to drive home.  Someone NEEDS to stay with you for the first 24 hours after anesthesia.   Special Instructions: Bring a copy of your healthcare power of attorney and living will documents the day of surgery if you haven't scanned them before.              Please read over the following fact sheets you were given: IF YOU HAVE QUESTIONS ABOUT YOUR PRE-OP INSTRUCTIONS PLEASE CALL [phone]   If you received a COVID test during your pre-op visit  it is requested that you wear a mask when out in public, stay away from anyone that may not be feeling well and notify your surgeon if you develop symptoms. If you test positive for Covid or have been in contact with anyone that has tested positive in the last 10 days please notify you surgeon.

## 2023-03-09 NOTE — Progress Notes (Signed)
Anesthesia Review:  PCP: Alyssa Allwardt preop eval on 02/01/23  Cardiologist : Chest x-ray : EKG : 12/21/22  Echo : 04/24/22  Stress test: Cardiac Cath :  Activity level:  Sleep Study/ CPAP : Fasting Blood Sugar :      / Checks Blood Sugar -- times a day:   Blood Thinner/ Instructions /Last Dose: ASA / Instructions/ Last Dose :    Hgba1c- 02/01/23- 5.8

## 2023-03-10 ENCOUNTER — Other Ambulatory Visit: Payer: Self-pay | Admitting: Orthopaedic Surgery

## 2023-03-12 ENCOUNTER — Other Ambulatory Visit: Payer: Self-pay

## 2023-03-12 ENCOUNTER — Encounter (HOSPITAL_COMMUNITY): Payer: Self-pay

## 2023-03-12 ENCOUNTER — Encounter (HOSPITAL_COMMUNITY)
Admission: RE | Admit: 2023-03-12 | Discharge: 2023-03-12 | Disposition: A | Discharge status: Home / Self Care | Payer: Medicare PPO | Source: Ambulatory Visit | Attending: Orthopaedic Surgery | Admitting: Orthopaedic Surgery

## 2023-03-12 VITALS — BP 141/70 | HR 70 | Temp 98.2°F | Resp 16 | Ht 65.0 in

## 2023-03-12 DIAGNOSIS — M1711 Unilateral primary osteoarthritis, right knee: Secondary | ICD-10-CM | POA: Diagnosis not present

## 2023-03-12 DIAGNOSIS — I110 Hypertensive heart disease with heart failure: Secondary | ICD-10-CM | POA: Insufficient documentation

## 2023-03-12 DIAGNOSIS — Z01812 Encounter for preprocedural laboratory examination: Secondary | ICD-10-CM | POA: Diagnosis not present

## 2023-03-12 DIAGNOSIS — I5022 Chronic systolic (congestive) heart failure: Secondary | ICD-10-CM | POA: Insufficient documentation

## 2023-03-12 DIAGNOSIS — Z01818 Encounter for other preprocedural examination: Secondary | ICD-10-CM

## 2023-03-12 LAB — BASIC METABOLIC PANEL
Anion gap: 10 (ref 5–15)
BUN: 30 mg/dL — ABNORMAL HIGH (ref 8–23)
CO2: 22 mmol/L (ref 22–32)
Calcium: 9.5 mg/dL (ref 8.9–10.3)
Chloride: 101 mmol/L (ref 98–111)
Creatinine, Ser: 1.58 mg/dL — ABNORMAL HIGH (ref 0.44–1.00)
GFR, Estimated: 30 mL/min — ABNORMAL LOW (ref >60)
Glucose, Bld: 108 mg/dL — ABNORMAL HIGH (ref 70–99)
Potassium: 4.4 mmol/L (ref 3.5–5.1)
Sodium: 133 mmol/L — ABNORMAL LOW (ref 135–145)

## 2023-03-12 LAB — CBC
HCT: 34.8 % — ABNORMAL LOW (ref 36.0–46.0)
Hemoglobin: 11.3 g/dL — ABNORMAL LOW (ref 12.0–15.0)
MCH: 33 pg (ref 26.0–34.0)
MCHC: 32.5 g/dL (ref 30.0–36.0)
MCV: 101.8 fL — ABNORMAL HIGH (ref 80.0–100.0)
Platelets: 218 10*3/uL (ref 150–400)
RBC: 3.42 MIL/uL — ABNORMAL LOW (ref 3.87–5.11)
RDW: 12.8 % (ref 11.5–15.5)
WBC: 5.6 10*3/uL (ref 4.0–10.5)
nRBC: 0 % (ref 0.0–0.2)

## 2023-03-12 LAB — SURGICAL PCR SCREEN
MRSA, PCR: NEGATIVE
Staphylococcus aureus: NEGATIVE

## 2023-03-14 NOTE — Anesthesia Preprocedure Evaluation (Addendum)
Anesthesia Evaluation  Patient identified by MRN, date of birth, ID band Patient awake    Reviewed: Allergy & Precautions, H&P , NPO status , Patient's Chart, lab work & pertinent test results  Airway Mallampati: II  TM Distance: >3 FB Neck ROM: Full    Dental no notable dental hx.    Pulmonary neg pulmonary ROS   Pulmonary exam normal breath sounds clear to auscultation       Cardiovascular hypertension, Pt. on medications +CHF  + Valvular Problems/Murmurs MR  Rhythm:Regular Rate:Normal + Systolic murmurs Echo 04/24/2022  1. Left ventricular ejection fraction, by estimation, is 40 to 45%. The  left ventricle has mildly decreased function. The left ventricle  demonstrates global hypokinesis. The left ventricular internal cavity size  was mildly dilated. Left ventricular  diastolic parameters are consistent with Grade II diastolic dysfunction  (pseudonormalization).   2. Right ventricular systolic function is low normal. The right  ventricular size is normal. There is normal pulmonary artery systolic  pressure. The estimated right ventricular systolic pressure is 32.4 mmHg.   3. Left atrial size was mildly dilated.   4. A small pericardial effusion is present. The pericardial effusion is  circumferential. There is no evidence of cardiac tamponade.   5. The mitral valve is grossly normal. Mild to moderate mitral valve  regurgitation. No evidence of mitral stenosis.   6. Tricuspid valve regurgitation is mild to moderate.   7. The aortic valve is tricuspid. Aortic valve regurgitation is not  visualized. No aortic stenosis is present.   8. The inferior vena cava is normal in size with greater than 50%  respiratory variability, suggesting right atrial pressure of 3 mmHg.       Neuro/Psych negative neurological ROS  negative psych ROS   GI/Hepatic negative GI ROS, Neg liver ROS,,,  Endo/Other  negative endocrine ROS     Renal/GU negative Renal ROS  negative genitourinary   Musculoskeletal  (+) Arthritis , Osteoarthritis,    Abdominal   Peds negative pediatric ROS (+)  Hematology negative hematology ROS (+)   Anesthesia Other Findings   Reproductive/Obstetrics negative OB ROS                             Anesthesia Physical Anesthesia Plan  ASA: 3  Anesthesia Plan: Spinal   Post-op Pain Management: Regional block*   Induction: Intravenous  PONV Risk Score and Plan: 2 and Propofol infusion, Ondansetron, Dexamethasone and Treatment may vary due to age or medical condition  Airway Management Planned: Simple Face Mask  Additional Equipment:   Intra-op Plan:   Post-operative Plan:   Informed Consent: I have reviewed the patients History and Physical, chart, labs and discussed the procedure including the risks, benefits and alternatives for the proposed anesthesia with the patient or authorized representative who has indicated his/her understanding and acceptance.     Dental advisory given  Plan Discussed with: CRNA and Surgeon  Anesthesia Plan Comments: (See PAT note 03/12/2023)       Anesthesia Quick Evaluation

## 2023-03-14 NOTE — Progress Notes (Signed)
Anesthesia chart review   Case: [id number] Date/Time: 03/22/23 0943   Procedure: RIGHT TOTAL KNEE ARTHROPLASTY (Right: Knee)   Anesthesia type: Spinal   Pre-op diagnosis: osteoarthritis right knee   Location: [facility] ROOM 10 / [facility]   Surgeons: Kathryne Hitch, MD       DISCUSSION: 89 year old never smoker with history of HTN, ASD, Chronic Systolic CHF, right knee OA scheduled for above procedure 03/22/2023 with Dr. Doneen Poisson.  Patient seen by PCP 02/01/2023 for preoperative evaluation.  Per note Her RCRI risk score is 0 points.  Patient last seen by cardiology 12/21/2022.  EF declined echo 12/17/18 25-30% now improved on entresto TTE 04/24/22  40-45%.  Per Dr. Eden Emms despite her age patient good candidate for surgery.  Anticipate pt can proceed with planned procedure barring acute status change.   VS: BP (!) 141/70   Pulse 70   Temp 36.8 C (Oral)   Resp 16   Ht 5\' 5"  (1.651 m)   LMP  (LMP Unknown)   SpO2 100%   BMI 23.40 kg/m   PROVIDERS: Allwardt, Crist Infante, PA-C Is PCP  Charlton Haws, MD is cardiologist LABS: Labs reviewed: Acceptable for surgery. (all labs ordered are listed, but only abnormal results are displayed)  Labs Reviewed  CBC - Abnormal; Notable for the following components:      Result Value   RBC 3.42 (*)    Hemoglobin 11.3 (*)    HCT 34.8 (*)    MCV 101.8 (*)    All other components within normal limits  BASIC METABOLIC PANEL - Abnormal; Notable for the following components:   Sodium 133 (*)    Glucose, Bld 108 (*)    BUN 30 (*)    Creatinine, Ser 1.58 (*)    GFR, Estimated 30 (*)    All other components within normal limits  SURGICAL PCR SCREEN     IMAGES:   EKG:   CV: Echo 04/24/2022  1. Left ventricular ejection fraction, by estimation, is 40 to 45%. The  left ventricle has mildly decreased function. The left ventricle  demonstrates global hypokinesis. The left ventricular internal cavity size  was mildly dilated. Left  ventricular  diastolic parameters are consistent with Grade II diastolic dysfunction  (pseudonormalization).   2. Right ventricular systolic function is low normal. The right  ventricular size is normal. There is normal pulmonary artery systolic  pressure. The estimated right ventricular systolic pressure is 32.4 mmHg.   3. Left atrial size was mildly dilated.   4. A small pericardial effusion is present. The pericardial effusion is  circumferential. There is no evidence of cardiac tamponade.   5. The mitral valve is grossly normal. Mild to moderate mitral valve  regurgitation. No evidence of mitral stenosis.   6. Tricuspid valve regurgitation is mild to moderate.   7. The aortic valve is tricuspid. Aortic valve regurgitation is not  visualized. No aortic stenosis is present.   8. The inferior vena cava is normal in size with greater than 50%  respiratory variability, suggesting right atrial pressure of 3 mmHg.   Past Medical History:  Diagnosis Date   Anemia    Anxiety and depression 07/16/2009   ASD (atrial septal defect) per bubble study, 2004    Cardiomyopathy, normal cath 2007, normal EF per ECHO 2014    Hx of colonic polyps    Hypertension    Osteoarthritis    Osteopenia    Podagra 04/29/2014   RLS (restless legs syndrome) 07/10/2009  Past Surgical History:  Procedure Laterality Date   BACK SURGERY  2000   BREAST SURGERY     NEGATIVE BIOPSY   TONSILLECTOMY      MEDICATIONS:  AMBULATORY NON FORMULARY MEDICATION   buPROPion (WELLBUTRIN XL) 150 MG 24 hr tablet   carvedilol (COREG) 25 MG tablet   celecoxib (CELEBREX) 200 MG capsule   diclofenac Sodium (VOLTAREN) 1 % GEL   furosemide (LASIX) 20 MG tablet   GEMTESA 75 MG TABS   methocarbamol (ROBAXIN) 500 MG tablet   mirtazapine (REMERON) 30 MG tablet   Multiple Vitamin (MULTIVITAMIN) tablet   Multiple Vitamins-Minerals (BODY/HAIR/SKIN/NAILS) CAPS   polyethylene glycol (MIRALAX / GLYCOLAX) packet   predniSONE  (DELTASONE) 20 MG tablet   sacubitril-valsartan (ENTRESTO) 97-103 MG   spironolactone (ALDACTONE) 25 MG tablet   triamcinolone cream (KENALOG) 0.1 %   VYZULTA 0.024 % SOLN   No current facility-administered medications for this encounter.    Jodell Cipro Ward, PA-C [facility]-Surgical Testing [phone]

## 2023-03-20 ENCOUNTER — Telehealth: Payer: Self-pay | Admitting: *Deleted

## 2023-03-20 ENCOUNTER — Other Ambulatory Visit: Payer: Self-pay | Admitting: *Deleted

## 2023-03-20 DIAGNOSIS — M1711 Unilateral primary osteoarthritis, right knee: Secondary | ICD-10-CM

## 2023-03-20 NOTE — Telephone Encounter (Signed)
Pre-op call completed; I am very familiar with patient and her daughter, who had both hips done with Dr. Roda Shutters- they both report allergies to adhesive tape. [name] [name]- daughter really blistered and had profound allergy to Aquacel dressing after her last hip.They are worried she will do the same. I told them I would pass this along.

## 2023-03-20 NOTE — Care Plan (Signed)
[facility] RNCM call to patient to discuss her upcoming Right total knee arthroplasty with Dr. Magnus Ivan on 03/22/23. She is an Ortho bundle patient through [facility] and is agreeable to case management. She lives with her daughter, who will be assisting after surgery. She has a RW, 3in1/BSC, cane and shower chair at home already. No other DME needs. Anticipate HHPT will be needed after a short hospital stay. Referral made to [facility] after choice provided. Reviewed all post-op instructions with patient and her daughter. Will continue to follow for needs.

## 2023-03-21 DIAGNOSIS — M1711 Unilateral primary osteoarthritis, right knee: Secondary | ICD-10-CM | POA: Insufficient documentation

## 2023-03-21 NOTE — H&P (Signed)
TOTAL KNEE ADMISSION H&P  Patient is being admitted for right total knee arthroplasty.  Subjective:  Chief Complaint:right knee pain.  HPI: [name] [name], 89 y.o. female, has a history of pain and functional disability in the right knee due to arthritis and has failed non-surgical conservative treatments for greater than 12 weeks to includeNSAID's and/or analgesics, corticosteriod injections, viscosupplementation injections, flexibility and strengthening excercises, supervised PT with diminished ADL's post treatment, use of assistive devices, and activity modification.  Onset of symptoms was gradual, starting 5 years ago with gradually worsening course since that time. The patient noted no past surgery on the right knee(s).  Patient currently rates pain in the right knee(s) at 10 out of 10 with activity. Patient has night pain, worsening of pain with activity and weight bearing, pain that interferes with activities of daily living, pain with passive range of motion, crepitus, and joint swelling.  Patient has evidence of subchondral sclerosis, periarticular osteophytes, and joint space narrowing by imaging studies. There is no active infection.  Patient Active Problem List   Diagnosis Date Noted   Unilateral primary osteoarthritis, right knee 03/21/2023   Depression, recurrent (HCC) 02/20/2020   Prediabetes 01/26/2019   History of exercise intolerance 01/26/2019   HLD (hyperlipidemia) 04/07/2018   DJD (degenerative joint disease) 02/09/2010   PVCs (premature ventricular contractions) 07/20/2009   Anxiety and depression 07/16/2009   Cardiomyopathy (HCC) 04/23/2007   HTN (hypertension) 04/15/2007   Osteopenia 04/15/2007   Past Medical History:  Diagnosis Date   Anemia    Anxiety and depression 07/16/2009   ASD (atrial septal defect) per bubble study, 2004    Cardiomyopathy, normal cath 2007, normal EF per ECHO 2014    Hx of colonic polyps    Hypertension    Osteoarthritis     Osteopenia    Podagra 04/29/2014   RLS (restless legs syndrome) 07/10/2009    Past Surgical History:  Procedure Laterality Date   BACK SURGERY  2000   BREAST SURGERY     NEGATIVE BIOPSY   TONSILLECTOMY      No current facility-administered medications for this encounter.   Current Outpatient Medications  Medication Sig Dispense Refill Last Dose   buPROPion (WELLBUTRIN XL) 150 MG 24 hr tablet Take 150 mg by mouth every morning.      carvedilol (COREG) 25 MG tablet TAKE 1 TABLET BY MOUTH TWICE A DAY WITH FOOD 180 tablet 0    diclofenac Sodium (VOLTAREN) 1 % GEL Apply 4 g topically 4 (four) times daily. To affected joint. 1000 g 11    furosemide (LASIX) 20 MG tablet TAKE 1 TABLET BY MOUTH EVERY OTHER DAY 45 tablet 2    GEMTESA 75 MG TABS Take 75 mg by mouth daily.      methocarbamol (ROBAXIN) 500 MG tablet Take 1 tablet (500 mg total) by mouth every 6 (six) hours as needed. (Patient taking differently: Take 500 mg by mouth every 6 (six) hours as needed (Pain).) 40 tablet 1    mirtazapine (REMERON) 30 MG tablet Take 30 mg by mouth at bedtime.       Multiple Vitamin (MULTIVITAMIN) tablet Take 1 tablet by mouth every morning.      Multiple Vitamins-Minerals (BODY/HAIR/SKIN/NAILS) CAPS Take 2 capsules by mouth daily.      polyethylene glycol (MIRALAX / GLYCOLAX) packet Take 17 g by mouth every Monday, Wednesday, and Friday.      sacubitril-valsartan (ENTRESTO) 97-103 MG Take 1 tablet by mouth 2 (two) times daily. 180 tablet 3  spironolactone (ALDACTONE) 25 MG tablet TAKE 1 TABLET BY MOUTH EVERY OTHER DAY 45 tablet 2    VYZULTA 0.024 % SOLN Place 1 drop into both eyes at bedtime.      AMBULATORY NON FORMULARY MEDICATION Raised toilet seat Dispense 1 Dx code: M17.11 Use as needed 1 Device 0    celecoxib (CELEBREX) 200 MG capsule TAKE 1 CAPSULE (200 MG TOTAL) BY MOUTH 2 (TWO) TIMES DAILY BETWEEN MEALS AS NEEDED. 60 capsule 1    predniSONE (DELTASONE) 20 MG tablet Take 1 tablet (20 mg total)  by mouth daily with breakfast. Take one tablet daily for 5 days. (Patient not taking: Reported on 03/08/2023) 5 tablet 0 Completed Course   triamcinolone cream (KENALOG) 0.1 % APPLY 1 APPLICATION TOPICALLY 2 (TWO) TIMES DAILY. FOR ITCHING AND RASH. (Patient not taking: Reported on 03/08/2023) 30 g 0 Not Taking   Allergies  Allergen Reactions   Shellfish Allergy Anaphylaxis   Codeine Other (See Comments)    Unknown.   Hydrocodone Other (See Comments)    Unknown.   Adhesive [Tape] Rash    Social History   Tobacco Use   Smoking status: Never   Smokeless tobacco: Never  Substance Use Topics   Alcohol use: No    Alcohol/week: 0.0 standard drinks of alcohol    Family History  Problem Relation Age of Onset   Heart attack Father    Heart disease Mother    Hypertension Other    Diabetes Neg Hx    Colon cancer Neg Hx      Review of Systems  Objective:  Physical Exam Vitals reviewed.  Constitutional:      Appearance: Normal appearance. She is normal weight.  HENT:     Head: Normocephalic and atraumatic.  Eyes:     Extraocular Movements: Extraocular movements intact.     Pupils: Pupils are equal, round, and reactive to light.  Cardiovascular:     Rate and Rhythm: Normal rate.  Pulmonary:     Effort: Pulmonary effort is normal.  Abdominal:     Palpations: Abdomen is soft.  Musculoskeletal:     Cervical back: Normal range of motion and neck supple.     Right knee: Effusion, bony tenderness and crepitus present. Decreased range of motion. Tenderness present over the medial joint line, lateral joint line and patellar tendon. Abnormal alignment.  Neurological:     Mental Status: She is alert and oriented to person, place, and time.  Psychiatric:        Behavior: Behavior normal.     Vital signs in last 24 hours:    Labs:   Estimated body mass index is 23.4 kg/m as calculated from the following:   Height as of 03/12/23: 5\' 5"  (1.651 m).   Weight as of 02/01/23: 63.8  kg.   Imaging Review Plain radiographs demonstrate severe degenerative joint disease of the right knee(s). The overall alignment ismild valgus. The bone quality appears to be fair for age and reported activity level.      Assessment/Plan:  End stage arthritis, right knee   The patient history, physical examination, clinical judgment of the provider and imaging studies are consistent with end stage degenerative joint disease of the right knee(s) and total knee arthroplasty is deemed medically necessary. The treatment options including medical management, injection therapy arthroscopy and arthroplasty were discussed at length. The risks and benefits of total knee arthroplasty were presented and reviewed. The risks due to aseptic loosening, infection, stiffness, patella tracking problems, thromboembolic complications  and other imponderables were discussed. The patient acknowledged the explanation, agreed to proceed with the plan and consent was signed. Patient is being admitted for inpatient treatment for surgery, pain control, PT, OT, prophylactic antibiotics, VTE prophylaxis, progressive ambulation and ADL's and discharge planning. The patient is planning to be discharged to skilled nursing facility

## 2023-03-22 ENCOUNTER — Encounter (HOSPITAL_COMMUNITY)
Admission: AD | Disposition: A | Discharge status: Home Health | Payer: Self-pay | Source: Ambulatory Visit | Attending: Orthopaedic Surgery

## 2023-03-22 ENCOUNTER — Observation Stay (HOSPITAL_COMMUNITY): Payer: Medicare PPO

## 2023-03-22 ENCOUNTER — Inpatient Hospital Stay (HOSPITAL_COMMUNITY)
Admission: AD | Admit: 2023-03-22 | Discharge: 2023-03-24 | DRG: 470 | Disposition: A | Discharge status: Home Health | Payer: Medicare PPO | Source: Ambulatory Visit | Attending: Orthopaedic Surgery | Admitting: Orthopaedic Surgery

## 2023-03-22 ENCOUNTER — Other Ambulatory Visit: Payer: Self-pay

## 2023-03-22 ENCOUNTER — Ambulatory Visit (HOSPITAL_BASED_OUTPATIENT_CLINIC_OR_DEPARTMENT_OTHER): Payer: Medicare PPO | Admitting: Certified Registered"

## 2023-03-22 ENCOUNTER — Ambulatory Visit (HOSPITAL_COMMUNITY): Payer: Medicare PPO | Admitting: Physician Assistant

## 2023-03-22 ENCOUNTER — Encounter (HOSPITAL_COMMUNITY): Payer: Self-pay | Admitting: Orthopaedic Surgery

## 2023-03-22 DIAGNOSIS — I10 Essential (primary) hypertension: Secondary | ICD-10-CM | POA: Diagnosis present

## 2023-03-22 DIAGNOSIS — Z01818 Encounter for other preprocedural examination: Secondary | ICD-10-CM

## 2023-03-22 DIAGNOSIS — I509 Heart failure, unspecified: Secondary | ICD-10-CM | POA: Diagnosis not present

## 2023-03-22 DIAGNOSIS — Z8249 Family history of ischemic heart disease and other diseases of the circulatory system: Secondary | ICD-10-CM | POA: Diagnosis not present

## 2023-03-22 DIAGNOSIS — Z888 Allergy status to other drugs, medicaments and biological substances status: Secondary | ICD-10-CM

## 2023-03-22 DIAGNOSIS — Z91013 Allergy to seafood: Secondary | ICD-10-CM

## 2023-03-22 DIAGNOSIS — I110 Hypertensive heart disease with heart failure: Secondary | ICD-10-CM | POA: Diagnosis not present

## 2023-03-22 DIAGNOSIS — F32A Depression, unspecified: Secondary | ICD-10-CM | POA: Diagnosis present

## 2023-03-22 DIAGNOSIS — I340 Nonrheumatic mitral (valve) insufficiency: Secondary | ICD-10-CM

## 2023-03-22 DIAGNOSIS — M1711 Unilateral primary osteoarthritis, right knee: Secondary | ICD-10-CM

## 2023-03-22 DIAGNOSIS — M8580 Other specified disorders of bone density and structure, unspecified site: Secondary | ICD-10-CM | POA: Diagnosis present

## 2023-03-22 DIAGNOSIS — Z79899 Other long term (current) drug therapy: Secondary | ICD-10-CM | POA: Diagnosis not present

## 2023-03-22 DIAGNOSIS — E785 Hyperlipidemia, unspecified: Secondary | ICD-10-CM | POA: Diagnosis present

## 2023-03-22 DIAGNOSIS — Z7982 Long term (current) use of aspirin: Secondary | ICD-10-CM | POA: Diagnosis not present

## 2023-03-22 DIAGNOSIS — Z471 Aftercare following joint replacement surgery: Secondary | ICD-10-CM | POA: Diagnosis not present

## 2023-03-22 DIAGNOSIS — G8918 Other acute postprocedural pain: Secondary | ICD-10-CM | POA: Diagnosis not present

## 2023-03-22 DIAGNOSIS — Z96651 Presence of right artificial knee joint: Secondary | ICD-10-CM

## 2023-03-22 DIAGNOSIS — R54 Age-related physical debility: Secondary | ICD-10-CM | POA: Diagnosis present

## 2023-03-22 DIAGNOSIS — G2581 Restless legs syndrome: Secondary | ICD-10-CM | POA: Diagnosis present

## 2023-03-22 DIAGNOSIS — R7303 Prediabetes: Secondary | ICD-10-CM | POA: Diagnosis present

## 2023-03-22 DIAGNOSIS — Z885 Allergy status to narcotic agent status: Secondary | ICD-10-CM | POA: Diagnosis not present

## 2023-03-22 HISTORY — PX: TOTAL KNEE ARTHROPLASTY: SHX125

## 2023-03-22 SURGERY — ARTHROPLASTY, KNEE, TOTAL
Anesthesia: Spinal | Site: Knee | Laterality: Right

## 2023-03-22 MED ORDER — HYDROMORPHONE HCL 1 MG/ML IJ SOLN
0.2500 mg | INTRAMUSCULAR | Status: DC | PRN
  Administered 2023-03-22: 0.25 mg via INTRAVENOUS

## 2023-03-22 MED ORDER — PROPOFOL 10 MG/ML IV BOLUS
INTRAVENOUS | Status: DC | PRN
  Administered 2023-03-22 (×2): 10 mg via INTRAVENOUS

## 2023-03-22 MED ORDER — BUPROPION HCL ER (XL) 150 MG PO TB24
150.0000 mg | ORAL_TABLET | Freq: Every day | ORAL | Status: DC
  Administered 2023-03-23 – 2023-03-24 (×2): 150 mg via ORAL
  Filled 2023-03-22 (×2): qty 1

## 2023-03-22 MED ORDER — SODIUM CHLORIDE 0.9 % IV SOLN: INTRAVENOUS | Status: DC

## 2023-03-22 MED ORDER — POLYETHYLENE GLYCOL 3350 17 G PO PACK
17.0000 g | PACK | Freq: Every day | ORAL | Status: DC | PRN
  Administered 2023-03-23: 17 g via ORAL
  Filled 2023-03-22: qty 1

## 2023-03-22 MED ORDER — SPIRONOLACTONE 25 MG PO TABS
25.0000 mg | ORAL_TABLET | ORAL | Status: DC
  Administered 2023-03-23 – 2023-03-24 (×2): 25 mg via ORAL
  Filled 2023-03-22 (×2): qty 1

## 2023-03-22 MED ORDER — METOCLOPRAMIDE HCL 5 MG PO TABS: 5.0000 mg | ORAL_TABLET | Freq: Three times a day (TID) | ORAL | Status: DC | PRN

## 2023-03-22 MED ORDER — ONDANSETRON HCL 4 MG/2ML IJ SOLN
4.0000 mg | Freq: Once | INTRAMUSCULAR | Status: AC | PRN
  Administered 2023-03-22: 4 mg via INTRAVENOUS

## 2023-03-22 MED ORDER — CEFAZOLIN SODIUM-DEXTROSE 1-4 GM/50ML-% IV SOLN
1.0000 g | Freq: Two times a day (BID) | INTRAVENOUS | Status: AC
  Administered 2023-03-22 – 2023-03-23 (×2): 1 g via INTRAVENOUS
  Filled 2023-03-22 (×2): qty 50

## 2023-03-22 MED ORDER — FENTANYL CITRATE PF 50 MCG/ML IJ SOSY
50.0000 ug | PREFILLED_SYRINGE | INTRAMUSCULAR | Status: DC
  Administered 2023-03-22: 50 ug via INTRAVENOUS
  Filled 2023-03-22: qty 2

## 2023-03-22 MED ORDER — TRANEXAMIC ACID-NACL 1000-0.7 MG/100ML-% IV SOLN
INTRAVENOUS | Status: AC
  Filled 2023-03-22: qty 100

## 2023-03-22 MED ORDER — PANTOPRAZOLE SODIUM 40 MG PO TBEC
40.0000 mg | DELAYED_RELEASE_TABLET | Freq: Every day | ORAL | Status: DC
  Administered 2023-03-23 – 2023-03-24 (×2): 40 mg via ORAL
  Filled 2023-03-22 (×2): qty 1

## 2023-03-22 MED ORDER — BUPIVACAINE HCL (PF) 0.25 % IJ SOLN
INTRAMUSCULAR | Status: DC | PRN
  Administered 2023-03-22: 50 mL

## 2023-03-22 MED ORDER — FUROSEMIDE 20 MG PO TABS
20.0000 mg | ORAL_TABLET | ORAL | Status: DC
  Administered 2023-03-23: 20 mg via ORAL
  Filled 2023-03-22 (×2): qty 1

## 2023-03-22 MED ORDER — CHLORHEXIDINE GLUCONATE 0.12 % MT SOLN
15.0000 mL | Freq: Once | OROMUCOSAL | Status: AC
  Administered 2023-03-22: 15 mL via OROMUCOSAL

## 2023-03-22 MED ORDER — MENTHOL 3 MG MT LOZG: 1.0000 | LOZENGE | OROMUCOSAL | Status: DC | PRN

## 2023-03-22 MED ORDER — PROPOFOL 500 MG/50ML IV EMUL
INTRAVENOUS | Status: DC | PRN
  Administered 2023-03-22: 40 ug/kg/min via INTRAVENOUS

## 2023-03-22 MED ORDER — MIDAZOLAM HCL 2 MG/2ML IJ SOLN: 1.0000 mg | INTRAMUSCULAR | Status: DC

## 2023-03-22 MED ORDER — HYDROCODONE-ACETAMINOPHEN 7.5-325 MG PO TABS
1.0000 | ORAL_TABLET | ORAL | Status: DC | PRN
  Administered 2023-03-22 – 2023-03-24 (×8): 1 via ORAL
  Filled 2023-03-22 (×8): qty 1

## 2023-03-22 MED ORDER — 0.9 % SODIUM CHLORIDE (POUR BTL) OPTIME
TOPICAL | Status: DC | PRN
  Administered 2023-03-22: 1000 mL

## 2023-03-22 MED ORDER — CEFAZOLIN SODIUM-DEXTROSE 2-4 GM/100ML-% IV SOLN
2.0000 g | INTRAVENOUS | Status: AC
  Administered 2023-03-22: 2 g via INTRAVENOUS
  Filled 2023-03-22: qty 100

## 2023-03-22 MED ORDER — SODIUM CHLORIDE 0.9 % IR SOLN
Status: DC | PRN
  Administered 2023-03-22: 1000 mL

## 2023-03-22 MED ORDER — OXYCODONE HCL 5 MG/5ML PO SOLN: 5.0000 mg | Freq: Once | ORAL | Status: DC | PRN

## 2023-03-22 MED ORDER — PHENYLEPHRINE HCL-NACL 20-0.9 MG/250ML-% IV SOLN
INTRAVENOUS | Status: AC
  Filled 2023-03-22: qty 250

## 2023-03-22 MED ORDER — ADULT MULTIVITAMIN W/MINERALS CH
1.0000 | ORAL_TABLET | Freq: Every morning | ORAL | Status: DC
  Administered 2023-03-23 – 2023-03-24 (×2): 1 via ORAL
  Filled 2023-03-22 (×2): qty 1

## 2023-03-22 MED ORDER — PHENOL 1.4 % MT LIQD: 1.0000 | OROMUCOSAL | Status: DC | PRN

## 2023-03-22 MED ORDER — ONDANSETRON HCL 4 MG/2ML IJ SOLN
INTRAMUSCULAR | Status: DC | PRN
  Administered 2023-03-22: 4 mg via INTRAVENOUS

## 2023-03-22 MED ORDER — DIPHENHYDRAMINE HCL 12.5 MG/5ML PO ELIX: 12.5000 mg | ORAL_SOLUTION | ORAL | Status: DC | PRN

## 2023-03-22 MED ORDER — HYDROMORPHONE HCL 1 MG/ML IJ SOLN
INTRAMUSCULAR | Status: AC
  Filled 2023-03-22: qty 1

## 2023-03-22 MED ORDER — MIRABEGRON ER 25 MG PO TB24
25.0000 mg | ORAL_TABLET | Freq: Every day | ORAL | Status: DC
  Administered 2023-03-22 – 2023-03-24 (×3): 25 mg via ORAL
  Filled 2023-03-22 (×3): qty 1

## 2023-03-22 MED ORDER — POVIDONE-IODINE 10 % EX SWAB: 2.0000 | Freq: Once | CUTANEOUS | Status: DC

## 2023-03-22 MED ORDER — MIRTAZAPINE 15 MG PO TABS
30.0000 mg | ORAL_TABLET | Freq: Every day | ORAL | Status: DC
  Administered 2023-03-22 – 2023-03-23 (×2): 30 mg via ORAL
  Filled 2023-03-22 (×2): qty 2

## 2023-03-22 MED ORDER — ALUM & MAG HYDROXIDE-SIMETH 200-200-20 MG/5ML PO SUSP: 30.0000 mL | ORAL | Status: DC | PRN

## 2023-03-22 MED ORDER — METOCLOPRAMIDE HCL 5 MG/ML IJ SOLN
5.0000 mg | Freq: Three times a day (TID) | INTRAMUSCULAR | Status: DC | PRN
  Administered 2023-03-22: 10 mg via INTRAVENOUS
  Filled 2023-03-22: qty 2

## 2023-03-22 MED ORDER — ONDANSETRON HCL 4 MG/2ML IJ SOLN: 4.0000 mg | Freq: Four times a day (QID) | INTRAMUSCULAR | Status: DC | PRN

## 2023-03-22 MED ORDER — CARVEDILOL 25 MG PO TABS
25.0000 mg | ORAL_TABLET | Freq: Two times a day (BID) | ORAL | Status: DC
  Administered 2023-03-22 – 2023-03-24 (×4): 25 mg via ORAL
  Filled 2023-03-22 (×4): qty 1

## 2023-03-22 MED ORDER — ORAL CARE MOUTH RINSE: 15.0000 mL | Freq: Once | OROMUCOSAL | Status: AC

## 2023-03-22 MED ORDER — BUPIVACAINE IN DEXTROSE 0.75-8.25 % IT SOLN
INTRATHECAL | Status: DC | PRN
  Administered 2023-03-22: 1.4 mL via INTRATHECAL

## 2023-03-22 MED ORDER — PHENYLEPHRINE HCL-NACL 20-0.9 MG/250ML-% IV SOLN
INTRAVENOUS | Status: DC | PRN
  Administered 2023-03-22: 20 ug/min via INTRAVENOUS

## 2023-03-22 MED ORDER — TRANEXAMIC ACID-NACL 1000-0.7 MG/100ML-% IV SOLN
INTRAVENOUS | Status: DC | PRN
  Administered 2023-03-22: 1000 mg via INTRAVENOUS

## 2023-03-22 MED ORDER — LACTATED RINGERS IV SOLN: INTRAVENOUS | Status: DC

## 2023-03-22 MED ORDER — ROPIVACAINE HCL 5 MG/ML IJ SOLN
INTRAMUSCULAR | Status: DC | PRN
  Administered 2023-03-22: 20 mL via PERINEURAL

## 2023-03-22 MED ORDER — ONDANSETRON HCL 4 MG/2ML IJ SOLN
INTRAMUSCULAR | Status: AC
  Filled 2023-03-22: qty 2

## 2023-03-22 MED ORDER — BUPIVACAINE HCL 0.25 % IJ SOLN
INTRAMUSCULAR | Status: AC
  Filled 2023-03-22: qty 1

## 2023-03-22 MED ORDER — MORPHINE SULFATE (PF) 2 MG/ML IV SOLN: 0.5000 mg | INTRAVENOUS | Status: DC | PRN

## 2023-03-22 MED ORDER — SACUBITRIL-VALSARTAN 97-103 MG PO TABS
1.0000 | ORAL_TABLET | Freq: Two times a day (BID) | ORAL | Status: DC
  Administered 2023-03-22 – 2023-03-24 (×4): 1 via ORAL
  Filled 2023-03-22 (×5): qty 1

## 2023-03-22 MED ORDER — ASPIRIN 81 MG PO CHEW
81.0000 mg | CHEWABLE_TABLET | Freq: Two times a day (BID) | ORAL | Status: DC
  Administered 2023-03-22 – 2023-03-24 (×4): 81 mg via ORAL
  Filled 2023-03-22 (×4): qty 1

## 2023-03-22 MED ORDER — ACETAMINOPHEN 10 MG/ML IV SOLN: 1000.0000 mg | Freq: Once | INTRAVENOUS | Status: DC | PRN

## 2023-03-22 MED ORDER — OXYCODONE HCL 5 MG PO TABS: 5.0000 mg | ORAL_TABLET | Freq: Once | ORAL | Status: DC | PRN

## 2023-03-22 MED ORDER — LATANOPROSTENE BUNOD 0.024 % OP SOLN: 1.0000 [drp] | Freq: Every day | OPHTHALMIC | Status: DC

## 2023-03-22 MED ORDER — METHOCARBAMOL 500 MG PO TABS
500.0000 mg | ORAL_TABLET | Freq: Four times a day (QID) | ORAL | Status: DC | PRN
  Administered 2023-03-22 – 2023-03-23 (×2): 500 mg via ORAL
  Filled 2023-03-22 (×3): qty 1

## 2023-03-22 MED ORDER — HYDROCODONE-ACETAMINOPHEN 5-325 MG PO TABS: 1.0000 | ORAL_TABLET | ORAL | Status: DC | PRN

## 2023-03-22 MED ORDER — ACETAMINOPHEN 325 MG PO TABS: 325.0000 mg | ORAL_TABLET | Freq: Four times a day (QID) | ORAL | Status: DC | PRN

## 2023-03-22 MED ORDER — DOCUSATE SODIUM 100 MG PO CAPS
100.0000 mg | ORAL_CAPSULE | Freq: Two times a day (BID) | ORAL | Status: DC
  Administered 2023-03-22 – 2023-03-24 (×4): 100 mg via ORAL
  Filled 2023-03-22 (×4): qty 1

## 2023-03-22 MED ORDER — ONDANSETRON HCL 4 MG PO TABS
4.0000 mg | ORAL_TABLET | Freq: Four times a day (QID) | ORAL | Status: DC | PRN
  Administered 2023-03-22: 4 mg via ORAL
  Filled 2023-03-22: qty 1

## 2023-03-22 MED ORDER — METHOCARBAMOL 500 MG IVPB - SIMPLE MED: 500.0000 mg | Freq: Four times a day (QID) | INTRAVENOUS | Status: DC | PRN

## 2023-03-22 SURGICAL SUPPLY — 66 items
APL SKNCLS STERI-STRIP NONHPOA (GAUZE/BANDAGES/DRESSINGS) ×1
BAG COUNTER SPONGE SURGICOUNT (BAG) IMPLANT
BAG SPEC THK2 15X12 ZIP CLS (MISCELLANEOUS) ×1
BAG SPNG CNTER NS LX DISP (BAG)
BAG ZIPLOCK 12X15 (MISCELLANEOUS) ×1 IMPLANT
BENZOIN TINCTURE PRP APPL 2/3 (GAUZE/BANDAGES/DRESSINGS) IMPLANT
BLADE SAG 18X100X1.27 (BLADE) ×1 IMPLANT
BLADE SURG SZ10 CARB STEEL (BLADE) ×2 IMPLANT
BNDG CMPR 5X62 HK CLSR LF (GAUZE/BANDAGES/DRESSINGS) ×2
BNDG CMPR MED 10X6 ELC LF (GAUZE/BANDAGES/DRESSINGS) ×1
BNDG ELASTIC 6INX 5YD STR LF (GAUZE/BANDAGES/DRESSINGS) ×2 IMPLANT
BNDG ELASTIC 6X10 VLCR STRL LF (GAUZE/BANDAGES/DRESSINGS) IMPLANT
BOWL SMART MIX CTS (DISPOSABLE) IMPLANT
CEMENT BONE R 1X40 (Cement) IMPLANT
CEMENT BONE SIMPLEX SPEEDSET (Cement) IMPLANT
COMP TIB PS KNEE E 0D RT (Joint) ×1 IMPLANT
COMPONET TIB PS KNEE E 0D RT (Joint) IMPLANT
COOLER ICEMAN CLASSIC (MISCELLANEOUS) ×1 IMPLANT
COVER SURGICAL LIGHT HANDLE (MISCELLANEOUS) ×1 IMPLANT
CUFF TOURN SGL QUICK 34 (TOURNIQUET CUFF) ×1
CUFF TRNQT CYL 34X4.125X (TOURNIQUET CUFF) ×1 IMPLANT
DRAPE INCISE IOBAN 66X45 STRL (DRAPES) ×1 IMPLANT
DRAPE U-SHAPE 47X51 STRL (DRAPES) ×1 IMPLANT
DURAPREP 26ML APPLICATOR (WOUND CARE) ×1 IMPLANT
ELECT BLADE TIP CTD 4 INCH (ELECTRODE) ×1 IMPLANT
ELECT REM PT RETURN 15FT ADLT (MISCELLANEOUS) ×1 IMPLANT
FEMUR CMT CR STD SZ 6 RT KNEE (Joint) ×1 IMPLANT
FEMUR CMTD CR STD SZ 6 RT KNEE (Joint) IMPLANT
GAUZE PAD ABD 8X10 STRL (GAUZE/BANDAGES/DRESSINGS) ×2 IMPLANT
GAUZE SPONGE 4X4 12PLY STRL (GAUZE/BANDAGES/DRESSINGS) ×1 IMPLANT
GAUZE XEROFORM 1X8 LF (GAUZE/BANDAGES/DRESSINGS) IMPLANT
GLOVE BIO SURGEON STRL SZ7.5 (GLOVE) ×1 IMPLANT
GLOVE BIOGEL PI IND STRL 8 (GLOVE) ×2 IMPLANT
GLOVE ECLIPSE 8.0 STRL XLNG CF (GLOVE) ×1 IMPLANT
GOWN STRL REUS W/ TWL XL LVL3 (GOWN DISPOSABLE) ×2 IMPLANT
GOWN STRL REUS W/TWL XL LVL3 (GOWN DISPOSABLE) ×2
HANDPIECE INTERPULSE COAX TIP (DISPOSABLE) ×1
HDLS TROCR DRIL PIN KNEE 75 (PIN) ×1
HOLDER FOLEY CATH W/STRAP (MISCELLANEOUS) IMPLANT
IMMOBILIZER KNEE 20 (SOFTGOODS) ×1
IMMOBILIZER KNEE 20 THIGH 36 (SOFTGOODS) ×1 IMPLANT
IMMOBILIZER KNEE 22 UNIV (SOFTGOODS) IMPLANT
INSERT TIB ARTISURF SZ6-7 (Insert) IMPLANT
INSERT TIB AS PERS SZ 6-7 14 (Insert) IMPLANT
KIT TURNOVER KIT A (KITS) IMPLANT
NS IRRIG 1000ML POUR BTL (IV SOLUTION) ×1 IMPLANT
PACK TOTAL KNEE CUSTOM (KITS) ×1 IMPLANT
PAD COLD SHLDR WRAP-ON (PAD) ×1 IMPLANT
PADDING CAST COTTON 6X4 STRL (CAST SUPPLIES) ×2 IMPLANT
PIN DRILL HDLS TROCAR 75 4PK (PIN) IMPLANT
PROTECTOR NERVE ULNAR (MISCELLANEOUS) ×1 IMPLANT
SCREW FEMALE HEX FIX 25X2.5 (ORTHOPEDIC DISPOSABLE SUPPLIES) IMPLANT
SET HNDPC FAN SPRY TIP SCT (DISPOSABLE) ×1 IMPLANT
SET PAD KNEE POSITIONER (MISCELLANEOUS) ×1 IMPLANT
SPIKE FLUID TRANSFER (MISCELLANEOUS) IMPLANT
STAPLER VISISTAT 35W (STAPLE) IMPLANT
STEM POLY PAT PLY 35M KNEE (Knees) IMPLANT
STRIP CLOSURE SKIN 1/2X4 (GAUZE/BANDAGES/DRESSINGS) IMPLANT
SUT MNCRL AB 4-0 PS2 18 (SUTURE) IMPLANT
SUT VIC AB 0 CT1 27 (SUTURE) ×1
SUT VIC AB 0 CT1 27XBRD ANTBC (SUTURE) ×1 IMPLANT
SUT VIC AB 1 CT1 36 (SUTURE) ×2 IMPLANT
SUT VIC AB 2-0 CT1 27 (SUTURE) ×2
SUT VIC AB 2-0 CT1 TAPERPNT 27 (SUTURE) ×2 IMPLANT
TRAY FOLEY MTR SLVR 16FR STAT (SET/KITS/TRAYS/PACK) IMPLANT
WATER STERILE IRR 1000ML POUR (IV SOLUTION) ×2 IMPLANT

## 2023-03-22 NOTE — Interval H&P Note (Signed)
History and Physical Interval Note: The patient is here today for a right total knee arthroplasty to treat her severe right knee arthritis.  She is 89 years old and is a frail individual but has severe arthritis of the knee.  I have spoken to her cardiologist about this as well in terms of clearance for surgery.  Her daughter is also with her.  We all collectively agreed that this is something that she would like to try given the severe debilitating pain this is causing her in a functional limitation she has had due to her severe right knee arthritis.  There is been no acute or interval change in her medical status.  The risks and benefits of surgery been discussed in detail and informed consent has been obtained.  The right operative knee has been marked.  03/22/2023 8:34 AM  [name] [name]  has presented today for surgery, with the diagnosis of osteoarthritis right knee.  The various methods of treatment have been discussed with the patient and family. After consideration of risks, benefits and other options for treatment, the patient has consented to  Procedure(s): RIGHT TOTAL KNEE ARTHROPLASTY (Right) as a surgical intervention.  The patient's history has been reviewed, patient examined, no change in status, stable for surgery.  I have reviewed the patient's chart and labs.  Questions were answered to the patient's satisfaction.     Kathryne Hitch

## 2023-03-22 NOTE — Discharge Instructions (Signed)

## 2023-03-22 NOTE — Transfer of Care (Signed)
Immediate Anesthesia Transfer of Care Note  Patient: [place]  Procedure(s) Performed: RIGHT TOTAL KNEE ARTHROPLASTY (Right: Knee)  Patient Location: PACU  Anesthesia Type:MAC combined with regional for post-op pain  Level of Consciousness: awake, alert , and oriented  Airway & Oxygen Therapy: Patient Spontanous Breathing  Post-op Assessment: Report given to RN and Post -op Vital signs reviewed and stable  Post vital signs: Reviewed and stable  Last Vitals:  Vitals Value Taken Time  BP 139/67 03/22/23 1115  Temp    Pulse 66 03/22/23 1116  Resp 11 03/22/23 1116  SpO2 100 % 03/22/23 1116  Vitals shown include unvalidated device data.  Last Pain:  Vitals:   03/22/23 0915  TempSrc:   PainSc: 0-No pain         Complications: No notable events documented.

## 2023-03-22 NOTE — Care Plan (Signed)
Ortho Bundle Case Management Note  Patient Details  Name: [name] [name] MRN: [id number] Date of Birth: October 17, 1927                  [facility] RNCM call to patient to discuss her upcoming Right total knee arthroplasty with Dr. Magnus Ivan on 03/22/23. She is an Ortho bundle patient through [facility] and is agreeable to case management. She lives with her daughter, who will be assisting after surgery. She has a RW, 3in1/BSC, cane and shower chair at home already. No other DME needs. Anticipate HHPT will be needed after a short hospital stay. Referral made to [facility] after choice provided. Reviewed all post-op instructions with patient and her daughter. Will continue to follow for needs.    DME Arranged:   (Has RW, 3in1/BSC, shower chair and cane) DME Agency:     HH Arranged:  PT HH Agency:  [facility]  Additional Comments: Please contact me with any questions of if this plan should need to change.  Ralph Dowdy, RN, BSN, [employer]  [phone] 03/22/2023, 3:50 PM

## 2023-03-22 NOTE — Anesthesia Postprocedure Evaluation (Signed)
Anesthesia Post Note  Patient: [name] [name]  Procedure(s) Performed: RIGHT TOTAL KNEE ARTHROPLASTY (Right: Knee)     Patient location during evaluation: PACU Anesthesia Type: Spinal Level of consciousness: awake and alert, oriented and patient cooperative Pain management: pain level controlled Vital Signs Assessment: post-procedure vital signs reviewed and stable Respiratory status: spontaneous breathing, nonlabored ventilation and respiratory function stable Cardiovascular status: blood pressure returned to baseline and stable Postop Assessment: no apparent nausea or vomiting, no headache, no backache and spinal receding Anesthetic complications: no   No notable events documented.  Last Vitals:  Vitals:   03/22/23 1130 03/22/23 1155  BP: 137/65   Pulse: 63 61  Resp: 11 11  Temp:    SpO2: 100% 100%    Last Pain:  Vitals:   03/22/23 1130  TempSrc:   PainSc: 0-No pain                 Lannie Fields

## 2023-03-22 NOTE — Anesthesia Procedure Notes (Signed)
Anesthesia Regional Block: Adductor canal block   Pre-Anesthetic Checklist: , timeout performed,  Correct Patient, Correct Site, Correct Laterality,  Correct Procedure, Correct Position, site marked,  Risks and benefits discussed,  Surgical consent,  Pre-op evaluation,  At surgeon's request and post-op pain management  Laterality: Right  Prep: chloraprep       Needles:  Injection technique: Single-shot  Needle Type: Echogenic Needle     Needle Length: 9cm      Additional Needles:   Procedures:,,,, ultrasound used (permanent image in chart),,    Narrative:  Start time: 03/22/2023 8:52 AM End time: 03/22/2023 8:59 AM Injection made incrementally with aspirations every 5 mL.  Performed by: Personally  Anesthesiologist: Eilene Ghazi, MD  Additional Notes: Patient tolerated the procedure well without complications

## 2023-03-22 NOTE — Op Note (Signed)
Operative Note  Date of operation: 03/22/2023 Preoperative diagnosis: Right knee primary osteoarthritis Postoperative diagnosis: Same  Procedure: Right cemented total knee arthroplasty  Implants: Biomet/Zimmer persona knee system Implant Name Type Inv. Item Serial No. Manufacturer Lot No. LRB No. Used Action  CEMENT BONE R 1X40 - [id number] Cement CEMENT BONE R 1X40  ZIMMER RECON(ORTH,TRAU,BIO,SG) [id number] Right 2 Implanted  STEM POLY PAT PLY 30M KNEE - [id number] Knees STEM POLY PAT PLY 30M KNEE  ZIMMER RECON(ORTH,TRAU,BIO,SG) [id number] Right 1 Implanted  COMP TIB PS KNEE E 0D RT - [id number] Joint COMP TIB PS KNEE E 0D RT  ZIMMER RECON(ORTH,TRAU,BIO,SG) [id number] Right 1 Implanted  FEMUR CMT CR STD SZ 6 RT KNEE - [id number] Joint FEMUR CMT CR STD SZ 6 RT KNEE  ZIMMER RECON(ORTH,TRAU,BIO,SG) [id number] Right 1 Implanted  INSERT TIB AS PERS SZ 6-7 14 - [id number] Insert INSERT TIB AS PERS SZ 6-7 14  ZIMMER RECON(ORTH,TRAU,BIO,SG) [id number] Right 1 Implanted   Surgeon: Vanita Panda. Magnus Ivan, MD Assistant: Rexene Edison, PA-C  Anesthesia: #1 right lower extremity adductor canal block, #2 spinal, #3 local Tourniquet time: Under 1 hour EBL: Less than 100 cc Antibiotics: IV Ancef Complications: None  Indications: The patient is a 89 year old female with well-documented debilitating arthritis of her right knee has been followed by multiple providers for many years now.  She has had all forms conservative treatment.  At this point her right knee pain is so severe that it is debilitating her on a daily basis.  It is detrimentally affecting her mobility, her quality of life and her actives daily living.  She has talked in length in detail with [place] as well as her daughter and family about having surgery on her knee given her daily pain that is quite severe.  She is reached out to her cardiologist who is provide clearance for the surgery.  She knows that given her age there is still a significant risk of  perioperative issues as a relates to her frailty.  However her knee arthritis is so severe that we have collectively agreed to proceed with the surgery.  We did discuss in detail the risks of acute blood loss anemia, nerve or vessel injury, fracture, infection, DVT, implant failure and wound healing issues.  Hopefully the goal is decreased pain, improve mobility, and improve quality of life.  Procedure description: After informed consent was obtained and the appropriate right knee was marked, anesthesia obtained a right lower extremity adductor canal block in the holding room.  The patient was then brought to the operating room and set up on the operating table where spinal anesthesia was obtained.  She was then laid in supine position on the operating table.  A Foley catheter was placed and a nonsterile tourniquet is placed around her upper right thigh.  Her right thigh, knee, leg, ankle and foot were prepped and draped with DuraPrep and sterile drapes including a sterile stockinette.  A timeout was called and she was identified as correct patient the correct right knee.  Her right leg was then wrapped out with an Esmarch and the tourniquet was inflated to 300 mm of pressure.  With the knee extended she had slight flexion contracture and valgus malalignment.  The midline incision was made over the patella and carried proximally distally.  We dissected down the knee joint and carried out a medial parapatellar arthrotomy finding a large joint effusion.  There was synovitis throughout the knee as well as osteophytes in all 3 compartments and significant wear and  deformity of the lateral compartment of the knee.  We removed osteophytes from all 3 compartments as well as remnants of the medial lateral meniscus and the ACL.  We then used an extramedullary cutting guide for making her proximal tibia cut correction varus valgus and a 7 degree slope.  We made this cut without difficulty after taking 2 mm off the low side.   We then went to the femur and used an intramedullary cutting guide for making her distal femoral cut for the right knee at 5 degrees externally rotated for 10 mm distal femoral cut.  We brought the knee back down to full extension and we achieved full extension and she slightly hyperextended.  We then back to the femur and chose a femoral sizing guide based off the epicondylar axis.  Based off of this we chose a size 6 femur.  We put a 4-in-1 cutting block for size 6 femur and made our anterior posterior cuts followed our chamfer cuts.  We then backed the tibia and chose a size E keeled tibia for coverage over the tibial plateau setting the rotation off the tibial tubercle on the femur.  Then did a drill hole and keel punch off of this.  We then trialed our size E right tibia with our size 6 right CR standard femur.  We started with a 10 mm fixed-bearing polythene insert which was medial congruent insert and went up to 12 mm insert and we felt this was stable for her.  We then made a patella cut and drilled 3 holes for size 35 patella button.  We put the knee through several cycles of range of motion and again it was felt to be stable.  We then removed all trial instrumentation from the knee and irrigate the knee with normal saline solution.  We then placed our Marcaine with epinephrine around the arthrotomy.  The cement was mixed with the knee in a flexed position we cemented our Biomet Zimmer persona tibial tray for right knee size E followed by our size 6 right CR standard femur.  We placed our 12 mm medial congruent polythene insert and cemented our size 35 mm patella button I then held the knee fully extended while the cement hardened and compressed.  Once the cemented hardened I put the knee through range of motion I felt like there was just a little bit of rotational instability so we removed the 12 mm polythene liner and went up to a 14 mm liner and I was very pleased with stability without a liner and we  did not lose her extension.  She had full extension and full flexion.  The tourniquet was then let down and hemostasis was obtained electrocautery.  The arthrotomy was closed with interrupted #1 Vicryl suture followed by 0 Vicryl to close the deep tissue and 2-0 Vicryl to close the subcutaneous tissue.  The skin was closed with staples.  Well-padded sterile dressing was applied.  The patient was taken off of the operating table and taken to the recovery in stable addition.  Rexene Edison, PA-C did assist during the entire case and beginning to end and his assistance was crucial and medically necessary for soft tissue management and retraction, helping guide implant placement and a layered closure of the wound.

## 2023-03-22 NOTE — Evaluation (Signed)
Physical Therapy Evaluation Patient Details Name: [name] [name] MRN: [id number] DOB: [birth year] Today's Date: 03/22/2023  History of Present Illness  Pt is a 89 year old female s/p R TKA on 03/22/23  Clinical Impression  Pt is s/p TKA resulting in the deficits listed below (see PT Problem List).  Pt will benefit from acute skilled PT to increase their independence and safety with mobility to allow discharge.  Pt assisted with ambulating short distance in room.  Pt very agreeable and motivated.  Pt plans to return home with daughter at d/c and will need to be able to perform steps to enter home.  Follow up plan per notes is for HHPT.         Recommendations for follow up therapy are one component of a multi-disciplinary discharge planning process, led by the attending physician.  Recommendations may be updated based on patient status, additional functional criteria and insurance authorization.  Follow Up Recommendations       Assistance Recommended at Discharge PRN  Patient can return home with the following  A little help with bathing/dressing/bathroom;Help with stairs or ramp for entrance;Assistance with cooking/housework;Assist for transportation;A little help with walking and/or transfers    Equipment Recommendations None recommended by PT  Recommendations for Other Services       Functional Status Assessment Patient has had a recent decline in their functional status and demonstrates the ability to make significant improvements in function in a reasonable and predictable amount of time.     Precautions / Restrictions Precautions Precautions: Fall;Knee Required Braces or Orthoses: Knee Immobilizer - Right Restrictions Weight Bearing Restrictions: No RLE Weight Bearing: Weight bearing as tolerated      Mobility  Bed Mobility Overal bed mobility: Needs Assistance Bed Mobility: Supine to Sit, Sit to Supine     Supine to sit: Min assist Sit to supine: Min assist    General bed mobility comments: cues for technique, increased time and effort, assist for R LE support    Transfers Overall transfer level: Needs assistance Equipment used: Rolling walker (2 wheels) Transfers: Sit to/from Stand Sit to Stand: Min guard           General transfer comment: multimodal cues for UE and LE positioning    Ambulation/Gait Ambulation/Gait assistance: Min guard Gait Distance (Feet): 16 Feet Assistive device: Rolling walker (2 wheels) Gait Pattern/deviations: Step-to pattern, Decreased stance time - right, Antalgic       General Gait Details: max multimodal cues for sequence, increased time required; pt ambulated within room and then assisted back to bed (per request to nap); denies symptoms; reports 5/10 right knee pain  Stairs            Wheelchair Mobility    Modified Rankin (Stroke Patients Only)       Balance                                             Pertinent Vitals/Pain Pain Assessment Pain Assessment: 0-10 Pain Score: 5  Pain Location: right knee Pain Descriptors / Indicators: Aching, Sore Pain Intervention(s): Repositioned, Monitored during session, Ice applied    Home Living Family/patient expects to be discharged to:: Private residence Living Arrangements: Children   Type of Home: House Home Access: Stairs to enter Entrance Stairs-Rails: Left Entrance Stairs-Number of Steps: 3   Home Layout: One level Home Equipment: Agricultural consultant (2  wheels)      Prior Function Prior Level of Function : Independent/Modified Independent                     Hand Dominance        Extremity/Trunk Assessment        Lower Extremity Assessment Lower Extremity Assessment: RLE deficits/detail RLE Deficits / Details: able to perform SLR, observed approx 50* AAROM knee flexion in supine, maintained KI for safety today       Communication   Communication: HOH  Cognition Arousal/Alertness:  Awake/alert Behavior During Therapy: WFL for tasks assessed/performed Overall Cognitive Status: Within Functional Limits for tasks assessed                                          General Comments      Exercises     Assessment/Plan    PT Assessment Patient needs continued PT services  PT Problem List Decreased strength;Decreased activity tolerance;Decreased balance;Decreased mobility;Decreased knowledge of use of DME;Decreased knowledge of precautions;Pain       PT Treatment Interventions Stair training;Gait training;DME instruction;Therapeutic exercise;Balance training;Functional mobility training;Therapeutic activities;Patient/family education    PT Goals (Current goals can be found in the Care Plan section)  Acute Rehab PT Goals PT Goal Formulation: With patient/family Time For Goal Achievement: 03/29/23 Potential to Achieve Goals: Good    Frequency 7X/week     Co-evaluation               AM-PAC PT "6 Clicks" Mobility  Outcome Measure Help needed turning from your back to your side while in a flat bed without using bedrails?: A Little Help needed moving from lying on your back to sitting on the side of a flat bed without using bedrails?: A Little Help needed moving to and from a bed to a chair (including a wheelchair)?: A Little Help needed standing up from a chair using your arms (e.g., wheelchair or bedside chair)?: A Little Help needed to walk in hospital room?: A Little Help needed climbing 3-5 steps with a railing? : A Lot 6 Click Score: 17    End of Session Equipment Utilized During Treatment: Gait belt Activity Tolerance: Patient tolerated treatment well Patient left: in bed;with call bell/phone within reach;with family/visitor present Nurse Communication: Mobility status PT Visit Diagnosis: Difficulty in walking, not elsewhere classified (R26.2)    Time: 1610-9604 PT Time Calculation (min) (ACUTE ONLY): 32 min   Charges:   PT  Evaluation $PT Eval Low Complexity: 1 Low PT Treatments $Gait Training: 8-22 mins      Paulino Door, DPT Physical Therapist Acute Rehabilitation Services Office: [phone]   Janan Halter Payson 03/22/2023, 4:40 PM

## 2023-03-22 NOTE — Anesthesia Procedure Notes (Signed)
Anesthesia Procedure Image    

## 2023-03-22 NOTE — Anesthesia Procedure Notes (Signed)
Spinal  Patient location during procedure: OR Start time: 03/22/2023 9:27 AM End time: 03/22/2023 9:32 AM Staffing Performed: resident/CRNA  Anesthesiologist: Eilene Ghazi, MD Resident/CRNA: Marny Lowenstein, CRNA Performed by: Marny Lowenstein, CRNA Authorized by: Lannie Fields, DO   Preanesthetic Checklist Completed: patient identified, IV checked, site marked, risks and benefits discussed, surgical consent, monitors and equipment checked, pre-op evaluation and timeout performed Spinal Block Patient position: sitting Prep: DuraPrep Patient monitoring: heart rate, continuous pulse ox and blood pressure Approach: midline Location: L3-4 Injection technique: single-shot Needle Needle type: Pencan  Needle gauge: 25 G Assessment Sensory level: T6 Events: CSF return

## 2023-03-23 DIAGNOSIS — G2581 Restless legs syndrome: Secondary | ICD-10-CM | POA: Diagnosis present

## 2023-03-23 DIAGNOSIS — Z8249 Family history of ischemic heart disease and other diseases of the circulatory system: Secondary | ICD-10-CM | POA: Diagnosis not present

## 2023-03-23 DIAGNOSIS — I10 Essential (primary) hypertension: Secondary | ICD-10-CM | POA: Diagnosis present

## 2023-03-23 DIAGNOSIS — M1711 Unilateral primary osteoarthritis, right knee: Secondary | ICD-10-CM | POA: Diagnosis present

## 2023-03-23 DIAGNOSIS — Z888 Allergy status to other drugs, medicaments and biological substances status: Secondary | ICD-10-CM | POA: Diagnosis not present

## 2023-03-23 DIAGNOSIS — M8580 Other specified disorders of bone density and structure, unspecified site: Secondary | ICD-10-CM | POA: Diagnosis present

## 2023-03-23 DIAGNOSIS — Z885 Allergy status to narcotic agent status: Secondary | ICD-10-CM | POA: Diagnosis not present

## 2023-03-23 DIAGNOSIS — Z79899 Other long term (current) drug therapy: Secondary | ICD-10-CM | POA: Diagnosis not present

## 2023-03-23 DIAGNOSIS — R54 Age-related physical debility: Secondary | ICD-10-CM | POA: Diagnosis present

## 2023-03-23 DIAGNOSIS — R7303 Prediabetes: Secondary | ICD-10-CM | POA: Diagnosis present

## 2023-03-23 DIAGNOSIS — F32A Depression, unspecified: Secondary | ICD-10-CM | POA: Diagnosis present

## 2023-03-23 DIAGNOSIS — Z7982 Long term (current) use of aspirin: Secondary | ICD-10-CM | POA: Diagnosis not present

## 2023-03-23 DIAGNOSIS — E785 Hyperlipidemia, unspecified: Secondary | ICD-10-CM | POA: Diagnosis present

## 2023-03-23 DIAGNOSIS — Z91013 Allergy to seafood: Secondary | ICD-10-CM | POA: Diagnosis not present

## 2023-03-23 LAB — BASIC METABOLIC PANEL
Anion gap: 11 (ref 5–15)
BUN: 30 mg/dL — ABNORMAL HIGH (ref 8–23)
CO2: 17 mmol/L — ABNORMAL LOW (ref 22–32)
Calcium: 8.8 mg/dL — ABNORMAL LOW (ref 8.9–10.3)
Chloride: 108 mmol/L (ref 98–111)
Creatinine, Ser: 1.36 mg/dL — ABNORMAL HIGH (ref 0.44–1.00)
GFR, Estimated: 36 mL/min — ABNORMAL LOW (ref >60)
Glucose, Bld: 98 mg/dL (ref 70–99)
Potassium: 4.9 mmol/L (ref 3.5–5.1)
Sodium: 136 mmol/L (ref 135–145)

## 2023-03-23 LAB — CBC
HCT: 30.6 % — ABNORMAL LOW (ref 36.0–46.0)
Hemoglobin: 10.3 g/dL — ABNORMAL LOW (ref 12.0–15.0)
MCH: 33.6 pg (ref 26.0–34.0)
MCHC: 33.7 g/dL (ref 30.0–36.0)
MCV: 99.7 fL (ref 80.0–100.0)
Platelets: 140 10*3/uL — ABNORMAL LOW (ref 150–400)
RBC: 3.07 MIL/uL — ABNORMAL LOW (ref 3.87–5.11)
RDW: 12.7 % (ref 11.5–15.5)
WBC: 9 10*3/uL (ref 4.0–10.5)
nRBC: 0 % (ref 0.0–0.2)

## 2023-03-23 NOTE — Progress Notes (Signed)
Physical Therapy Treatment Patient Details Name: [name] [name] MRN: [id number] DOB: [birth year] Today's Date: 03/23/2023   History of Present Illness Pt is a 89 year old female s/p R TKA on 03/22/23    PT Comments    Pt ambulated short distance into hallway and assisted with performing LE exercises.  Pt remaining upright in recliner end of session.  Daughter feels pt will likely need another day inpatient to continue practicing mobility and monitoring pain control before pt is ready to return home with her.    Recommendations for follow up therapy are one component of a multi-disciplinary discharge planning process, led by the attending physician.  Recommendations may be updated based on patient status, additional functional criteria and insurance authorization.  Follow Up Recommendations       Assistance Recommended at Discharge PRN  Patient can return home with the following A little help with bathing/dressing/bathroom;Help with stairs or ramp for entrance;Assistance with cooking/housework;Assist for transportation;A little help with walking and/or transfers   Equipment Recommendations  None recommended by PT    Recommendations for Other Services       Precautions / Restrictions Precautions Precautions: Fall;Knee Required Braces or Orthoses: Knee Immobilizer - Right Restrictions RLE Weight Bearing: Weight bearing as tolerated     Mobility  Bed Mobility Overal bed mobility: Needs Assistance Bed Mobility: Supine to Sit     Supine to sit: Min guard     General bed mobility comments: cues for technique, increased time and effort, pt able to self assist R LE    Transfers Overall transfer level: Needs assistance Equipment used: Rolling walker (2 wheels) Transfers: Sit to/from Stand Sit to Stand: Min assist           General transfer comment: multimodal cues for UE and LE positioning, assist for stabilizing while pt transitioned hands to RW     Ambulation/Gait Ambulation/Gait assistance: Min guard Gait Distance (Feet): 40 Feet Assistive device: Rolling walker (2 wheels) Gait Pattern/deviations: Step-to pattern, Decreased stance time - right, Antalgic Gait velocity: decr     General Gait Details: max multimodal cues for sequence, increased time required; only 3/10 right knee pain reported   Stairs             Wheelchair Mobility    Modified Rankin (Stroke Patients Only)       Balance                                            Cognition Arousal/Alertness: Awake/alert Behavior During Therapy: WFL for tasks assessed/performed Overall Cognitive Status: Within Functional Limits for tasks assessed                                          Exercises Total Joint Exercises Ankle Circles/Pumps: AROM, Both, 10 reps Quad Sets: AROM, Both, 10 reps Heel Slides: AAROM, Right, 10 reps Hip ABduction/ADduction: AAROM, Right, 10 reps Straight Leg Raises: AAROM, Right, 10 reps    General Comments        Pertinent Vitals/Pain Pain Assessment Pain Assessment: 0-10 Pain Score: 3  Pain Location: right knee Pain Descriptors / Indicators: Aching, Sore Pain Intervention(s): Monitored during session, Repositioned    Home Living  Prior Function            PT Goals (current goals can now be found in the care plan section) Progress towards PT goals: Progressing toward goals    Frequency    7X/week      PT Plan Current plan remains appropriate    Co-evaluation              AM-PAC PT "6 Clicks" Mobility   Outcome Measure  Help needed turning from your back to your side while in a flat bed without using bedrails?: A Little Help needed moving from lying on your back to sitting on the side of a flat bed without using bedrails?: A Little Help needed moving to and from a bed to a chair (including a wheelchair)?: A Little Help needed  standing up from a chair using your arms (e.g., wheelchair or bedside chair)?: A Little Help needed to walk in hospital room?: A Little Help needed climbing 3-5 steps with a railing? : A Lot 6 Click Score: 17    End of Session Equipment Utilized During Treatment: Gait belt Activity Tolerance: Patient tolerated treatment well Patient left: in chair;with call bell/phone within reach;with chair alarm set;with family/visitor present   PT Visit Diagnosis: Difficulty in walking, not elsewhere classified (R26.2)     Time: 1610-9604 PT Time Calculation (min) (ACUTE ONLY): 26 min  Charges:  $Gait Training: 8-22 mins $Therapeutic Exercise: 8-22 mins                    Paulino Door, DPT Physical Therapist Acute Rehabilitation Services Office: [phone]   [name] [name] 03/23/2023, 12:04 PM

## 2023-03-23 NOTE — Progress Notes (Signed)
Subjective: 1 Day Post-Op Procedure(s) (LRB): RIGHT TOTAL KNEE ARTHROPLASTY (Right) Patient reports pain as moderate.    Objective: Vital signs in last 24 hours: Temp:  [96.6 F (35.9 C)-99.5 F (37.5 C)] 99.5 F (37.5 C) (05/10 0457) Pulse Rate:  [54-93] 93 (05/10 0457) Resp:  [9-23] 18 (05/10 0457) BP: (117-168)/(61-121) 153/75 (05/10 0457) SpO2:  [95 %-100 %] 100 % (05/10 0457) Weight:  [62.6 kg] 62.6 kg (05/09 0820)  Intake/Output from previous day: 05/09 0701 - 05/10 0700 In: 2526 [P.O.:420; I.V.:1856; IV Piggyback:250] Out: 1325 [Urine:1225; Blood:100] Intake/Output this shift: No intake/output data recorded.  Recent Labs    03/23/23 0336  HGB 10.3*   Recent Labs    03/23/23 0336  WBC 9.0  RBC 3.07*  HCT 30.6*  PLT 140*   Recent Labs    03/23/23 0336  NA 136  K 4.9  CL 108  CO2 17*  BUN 30*  CREATININE 1.36*  GLUCOSE 98  CALCIUM 8.8*   No results for input(s): "LABPT", "INR" in the last 72 hours.  Sensation intact distally Intact pulses distally Dorsiflexion/Plantar flexion intact Incision: dressing C/D/I No cellulitis present Compartment soft   Assessment/Plan: 1 Day Post-Op Procedure(s) (LRB): RIGHT TOTAL KNEE ARTHROPLASTY (Right) Up with therapy      Kathryne Hitch 03/23/2023, 7:20 AM

## 2023-03-23 NOTE — TOC Transition Note (Signed)
Transition of Care [facility]) - CM/SW Discharge Note   Patient Details  Name: [name] [name] MRN: [id number] Date of Birth: [birth year]  [facility] CM/SW Contact:  Amada Jupiter, LCSW Phone Number: 03/23/2023, 9:37 AM   Clinical Narrative:     Met with pt and daughter and confirming she has needed DME at home.  HHPT prearranged with [facility].  No TOC needs.  Final next level of care: Home w Home Health Services Barriers to Discharge: No Barriers Identified   Patient Goals and CMS Choice      Discharge Placement                         Discharge Plan and Services Additional resources added to the After Visit Summary for                  DME Arranged:  (Has RW, 3in1/BSC, shower chair and cane)         HH Arranged: PT HH Agency: [facility]        Social Determinants of Health (SDOH) Interventions SDOH Screenings   Food Insecurity: No Food Insecurity (03/22/2023)  Housing: Low Risk  (03/22/2023)  Transportation Needs: No Transportation Needs (03/22/2023)  Utilities: Not At Risk (03/22/2023)  Depression (PHQ2-9): Low Risk  (01/04/2023)  Financial Resource Strain: Low Risk  (01/04/2023)  Physical Activity: Insufficiently Active (01/04/2023)  Social Connections: Moderately Integrated (01/04/2023)  Stress: No Stress Concern Present (01/04/2023)  Tobacco Use: Low Risk  (03/22/2023)     Readmission Risk Interventions     No data to display

## 2023-03-23 NOTE — Plan of Care (Signed)
°  Problem: Pain Management: °Goal: Pain level will decrease with appropriate interventions °Outcome: Progressing °  °Problem: Coping: °Goal: Level of anxiety will decrease °Outcome: Progressing °  °Problem: Safety: °Goal: Ability to remain free from injury will improve °Outcome: Progressing °  °

## 2023-03-23 NOTE — Progress Notes (Signed)
Physical Therapy Treatment Patient Details Name: [name] [name] MRN: [id number] DOB: 1927/05/15 Today's Date: 03/23/2023   History of Present Illness Pt is a 89 year old female s/p R TKA on 03/22/23    PT Comments    Pt pleasant and motivated.  Pt assisted with ambulating short distance and then initiated stair training.  Pt requiring min assist for steps at this time.      Recommendations for follow up therapy are one component of a multi-disciplinary discharge planning process, led by the attending physician.  Recommendations may be updated based on patient status, additional functional criteria and insurance authorization.  Follow Up Recommendations       Assistance Recommended at Discharge PRN  Patient can return home with the following A little help with bathing/dressing/bathroom;Help with stairs or ramp for entrance;Assistance with cooking/housework;Assist for transportation;A little help with walking and/or transfers   Equipment Recommendations  None recommended by PT    Recommendations for Other Services       Precautions / Restrictions Precautions Precautions: Fall;Knee Required Braces or Orthoses: Knee Immobilizer - Right Restrictions RLE Weight Bearing: Weight bearing as tolerated     Mobility  Bed Mobility Overal bed mobility: Needs Assistance Bed Mobility: Supine to Sit, Sit to Supine     Supine to sit: Min guard Sit to supine: Min assist   General bed mobility comments: cues for technique, increased time and effort, pt able to self assist R LE out of bed but required light assist for R LE onto bed    Transfers Overall transfer level: Needs assistance Equipment used: Rolling walker (2 wheels) Transfers: Sit to/from Stand Sit to Stand: Min assist           General transfer comment: multimodal cues for UE and LE positioning, assist for stabilizing while pt transitioned hands to RW    Ambulation/Gait Ambulation/Gait assistance: Min  guard Gait Distance (Feet): 36 Feet Assistive device: Rolling walker (2 wheels) Gait Pattern/deviations: Step-to pattern, Decreased stance time - right, Antalgic Gait velocity: decr     General Gait Details: max multimodal cues for sequence, verbal cues for posture, increased time required   Stairs Stairs: Yes Stairs assistance: Min assist Stair Management: Forwards, Step to pattern, One rail Left, With cane Number of Stairs: 2 General stair comments: verbal cues for seqeuence, cane positioning, safety; assist for stability with weight shifting during ascending   Wheelchair Mobility    Modified Rankin (Stroke Patients Only)       Balance                                            Cognition Arousal/Alertness: Awake/alert Behavior During Therapy: WFL for tasks assessed/performed Overall Cognitive Status: Within Functional Limits for tasks assessed                                          Exercises     General Comments        Pertinent Vitals/Pain Pain Assessment Pain Assessment: 0-10 Pain Score: 3  Pain Location: right knee Pain Descriptors / Indicators: Aching, Sore Pain Intervention(s): Repositioned, Monitored during session    Home Living  Prior Function            PT Goals (current goals can now be found in the care plan section) Progress towards PT goals: Progressing toward goals    Frequency    7X/week      PT Plan Current plan remains appropriate    Co-evaluation              AM-PAC PT "6 Clicks" Mobility   Outcome Measure  Help needed turning from your back to your side while in a flat bed without using bedrails?: A Little Help needed moving from lying on your back to sitting on the side of a flat bed without using bedrails?: A Little Help needed moving to and from a bed to a chair (including a wheelchair)?: A Little Help needed standing up from a chair using  your arms (e.g., wheelchair or bedside chair)?: A Little Help needed to walk in hospital room?: A Little Help needed climbing 3-5 steps with a railing? : A Lot 6 Click Score: 17    End of Session Equipment Utilized During Treatment: Gait belt Activity Tolerance: Patient tolerated treatment well Patient left: with call bell/phone within reach;in bed;with bed alarm set;with family/visitor present   PT Visit Diagnosis: Difficulty in walking, not elsewhere classified (R26.2)     Time: 2130-8657 PT Time Calculation (min) (ACUTE ONLY): 21 min  Charges:  $Gait Training: 8-22 mins                    Paulino Door, DPT Physical Therapist Acute Rehabilitation Services Office: [phone]    Janan Halter Payson 03/23/2023, 2:56 PM

## 2023-03-24 MED ORDER — HYDROCODONE-ACETAMINOPHEN 5-325 MG PO TABS: 1.0000 | ORAL_TABLET | Freq: Four times a day (QID) | ORAL | 0 refills | Status: DC | PRN

## 2023-03-24 MED ORDER — ASPIRIN 81 MG PO CHEW: 81.0000 mg | CHEWABLE_TABLET | Freq: Two times a day (BID) | ORAL | 0 refills | Status: DC

## 2023-03-24 NOTE — Progress Notes (Signed)
Patient ID: [name] [name], female   DOB: 13-Jul-1927, 89 y.o.   MRN: [id number] The patient has been making slow and steady progress with her mobility given her advanced age.  Her right operative knee is stable.  Family is at the bedside.  The goal is for discharge to home this afternoon after being seen by therapy.

## 2023-03-24 NOTE — Progress Notes (Signed)
Physical Therapy Treatment Patient Details Name: [name] [name] MRN: [id number] DOB: 1927-08-23 Today's Date: 03/24/2023   History of Present Illness Pt is a 89 year old female s/p R TKA on 03/22/23    PT Comments    Pt assisted with ambulating in hallway and cues provided for technique.  Daughter present and also able to cue pt appropriately.  Pt anticipates d/c home today so will return to practice steps.    Recommendations for follow up therapy are one component of a multi-disciplinary discharge planning process, led by the attending physician.  Recommendations may be updated based on patient status, additional functional criteria and insurance authorization.  Follow Up Recommendations       Assistance Recommended at Discharge PRN  Patient can return home with the following A little help with bathing/dressing/bathroom;Help with stairs or ramp for entrance;Assistance with cooking/housework;Assist for transportation;A little help with walking and/or transfers   Equipment Recommendations  None recommended by PT    Recommendations for Other Services       Precautions / Restrictions Precautions Precautions: Fall;Knee Precaution Comments: did not use KI today however daughter educated on don/doff Restrictions RLE Weight Bearing: Weight bearing as tolerated     Mobility  Bed Mobility Overal bed mobility: Needs Assistance Bed Mobility: Supine to Sit     Supine to sit: Min guard     General bed mobility comments: cues for technique, increased time and effort, pt able to self assist R LE out of bed    Transfers Overall transfer level: Needs assistance Equipment used: Rolling walker (2 wheels) Transfers: Sit to/from Stand Sit to Stand: Min guard           General transfer comment: multimodal cues for UE and LE positioning    Ambulation/Gait Ambulation/Gait assistance: Min guard Gait Distance (Feet): 50 Feet Assistive device: Rolling walker (2 wheels) Gait  Pattern/deviations: Step-to pattern, Decreased stance time - right, Antalgic Gait velocity: decr     General Gait Details: max multimodal cues for sequence, verbal cues for posture, increased time required   Stairs             Wheelchair Mobility    Modified Rankin (Stroke Patients Only)       Balance                                            Cognition Arousal/Alertness: Awake/alert Behavior During Therapy: WFL for tasks assessed/performed Overall Cognitive Status: Within Functional Limits for tasks assessed                                          Exercises Total Joint Exercises Ankle Circles/Pumps: AROM, Both, 10 reps Knee Flexion: AAROM, Seated, Right, 10 reps    General Comments        Pertinent Vitals/Pain Pain Assessment Pain Assessment: 0-10 Pain Score: 3  Pain Location: right knee Pain Descriptors / Indicators: Aching, Sore Pain Intervention(s): Monitored during session, Repositioned    Home Living                          Prior Function            PT Goals (current goals can now be found in the care plan section) Progress  towards PT goals: Progressing toward goals    Frequency    7X/week      PT Plan Current plan remains appropriate    Co-evaluation              AM-PAC PT "6 Clicks" Mobility   Outcome Measure  Help needed turning from your back to your side while in a flat bed without using bedrails?: A Little Help needed moving from lying on your back to sitting on the side of a flat bed without using bedrails?: A Little Help needed moving to and from a bed to a chair (including a wheelchair)?: A Little Help needed standing up from a chair using your arms (e.g., wheelchair or bedside chair)?: A Little Help needed to walk in hospital room?: A Little Help needed climbing 3-5 steps with a railing? : A Little 6 Click Score: 18    End of Session Equipment Utilized During  Treatment: Gait belt Activity Tolerance: Patient tolerated treatment well Patient left: with call bell/phone within reach;with family/visitor present;in chair;with chair alarm set Nurse Communication: Mobility status PT Visit Diagnosis: Difficulty in walking, not elsewhere classified (R26.2)     Time: 6045-4098 PT Time Calculation (min) (ACUTE ONLY): 24 min  Charges:  $Gait Training: 23-37 mins                    Paulino Door, DPT Physical Therapist Acute Rehabilitation Services Office: [phone]    Janan Halter Payson 03/24/2023, 3:04 PM

## 2023-03-24 NOTE — Discharge Summary (Signed)
Patient ID: [name] [name] MRN: [id number] DOB/AGE: Mar 23, 1927 89 y.o.  Admit date: 03/22/2023 Discharge date: 03/24/2023  Admission Diagnoses:  Principal Problem:   Unilateral primary osteoarthritis, right knee Active Problems:   Status post total right knee replacement   Discharge Diagnoses:  Same  Past Medical History:  Diagnosis Date   Anemia    Anxiety and depression 07/16/2009   ASD (atrial septal defect) per bubble study, 2004    Cardiomyopathy, normal cath 2007, normal EF per ECHO 2014    Hx of colonic polyps    Hypertension    Osteoarthritis    Osteopenia    Podagra 04/29/2014   RLS (restless legs syndrome) 07/10/2009    Surgeries: Procedure(s): RIGHT TOTAL KNEE ARTHROPLASTY on 03/22/2023   Consultants:   Discharged Condition: Improved  Hospital Course: [name] [name] is an 89 y.o. female who was admitted 03/22/2023 for operative treatment ofUnilateral primary osteoarthritis, right knee. Patient has severe unremitting pain that affects sleep, daily activities, and work/hobbies. After pre-op clearance the patient was taken to the operating room on 03/22/2023 and underwent  Procedure(s): RIGHT TOTAL KNEE ARTHROPLASTY.    Patient was given perioperative antibiotics:  Anti-infectives (From admission, onward)    Start     Dose/Rate Route Frequency Ordered Stop   03/22/23 2130  ceFAZolin (ANCEF) IVPB 1 g/50 mL premix        1 g 100 mL/hr over 30 Minutes Intravenous Every 12 hours 03/22/23 1351 03/23/23 0928   03/22/23 0800  ceFAZolin (ANCEF) IVPB 2g/100 mL premix        2 g 200 mL/hr over 30 Minutes Intravenous On call to O.R. 03/22/23 0747 03/22/23 1002        Patient was given sequential compression devices, early ambulation, and chemoprophylaxis to prevent DVT.  Patient benefited maximally from hospital stay and there were no complications.    Recent vital signs: Patient Vitals for the past 24 hrs:  BP Temp Temp src Pulse Resp SpO2  03/24/23  0525 (!) 152/74 99 F (37.2 C) Oral (!) 102 18 100 %  03/23/23 2105 (!) 152/63 98.5 F (36.9 C) Oral 89 18 98 %  03/23/23 1414 (!) 154/73 98.6 F (37 C) Oral 89 16 99 %     Recent laboratory studies:  Recent Labs    03/23/23 0336  WBC 9.0  HGB 10.3*  HCT 30.6*  PLT 140*  NA 136  K 4.9  CL 108  CO2 17*  BUN 30*  CREATININE 1.36*  GLUCOSE 98  CALCIUM 8.8*     Discharge Medications:   Allergies as of 03/24/2023       Reactions   Shellfish Allergy Anaphylaxis   Codeine Other (See Comments)   Unknown.   Hydrocodone Other (See Comments)   Unknown.   Adhesive [tape] Rash        Medication List     TAKE these medications    AMBULATORY NON FORMULARY MEDICATION Raised toilet seat Dispense 1 Dx code: M17.11 Use as needed   aspirin 81 MG chewable tablet Chew 1 tablet (81 mg total) by mouth 2 (two) times daily.   Body/Hair/Skin/Nails Caps Take 2 capsules by mouth daily.   buPROPion 150 MG 24 hr tablet Commonly known as: WELLBUTRIN XL Take 150 mg by mouth every morning.   carvedilol 25 MG tablet Commonly known as: COREG TAKE 1 TABLET BY MOUTH TWICE A DAY WITH FOOD   celecoxib 200 MG capsule Commonly known as: CELEBREX TAKE 1 CAPSULE (200 MG TOTAL)  BY MOUTH 2 (TWO) TIMES DAILY BETWEEN MEALS AS NEEDED.   diclofenac Sodium 1 % Gel Commonly known as: VOLTAREN Apply 4 g topically 4 (four) times daily. To affected joint.   Entresto 97-103 MG Generic drug: sacubitril-valsartan Take 1 tablet by mouth 2 (two) times daily.   furosemide 20 MG tablet Commonly known as: LASIX TAKE 1 TABLET BY MOUTH EVERY OTHER DAY   Gemtesa 75 MG Tabs Generic drug: Vibegron Take 75 mg by mouth daily.   HYDROcodone-acetaminophen 5-325 MG tablet Commonly known as: NORCO/VICODIN Take 1 tablet by mouth every 6 (six) hours as needed for moderate pain (pain score 4-6).   methocarbamol 500 MG tablet Commonly known as: ROBAXIN Take 1 tablet (500 mg total) by mouth every 6 (six)  hours as needed. What changed: reasons to take this   mirtazapine 30 MG tablet Commonly known as: REMERON Take 30 mg by mouth at bedtime.   multivitamin tablet Take 1 tablet by mouth every morning.   polyethylene glycol 17 g packet Commonly known as: MIRALAX / GLYCOLAX Take 17 g by mouth every Monday, Wednesday, and Friday.   spironolactone 25 MG tablet Commonly known as: ALDACTONE TAKE 1 TABLET BY MOUTH EVERY OTHER DAY   Vyzulta 0.024 % Soln Generic drug: Latanoprostene Bunod Place 1 drop into both eyes at bedtime.               Durable Medical Equipment  (From admission, onward)           Start     Ordered   03/22/23 1352  DME 3 n 1  Once        03/22/23 1351   03/22/23 1352  DME Walker rolling  Once       Question Answer Comment  Walker: With 5 Inch Wheels   Patient needs a walker to treat with the following condition Status post total right knee replacement      03/22/23 1351            Diagnostic Studies: DG Knee Right Port  Result Date: 03/22/2023 CLINICAL DATA:  Status post right knee replacement. EXAM: PORTABLE RIGHT KNEE - 1-2 VIEW COMPARISON:  None Available. FINDINGS: Right knee arthroplasty in expected alignment. No periprosthetic lucency or fracture. There has been patellar resurfacing. Recent postsurgical change includes air and edema in the soft tissues and joint space. Anterior skin staples in place. IMPRESSION: Right knee arthroplasty without immediate postoperative complication. Electronically Signed   By: Narda Rutherford M.D.   On: 03/22/2023 14:43   XR Lumbar Spine 2-3 Views  Result Date: 02/26/2023 2 views of the lumbar spine show degenerative changes between L4 and L5 as well as L5 and S1.  There is a slight deformity of the L5 vertebral body.  This is in terms of the mild compression deformity that is age-indeterminate.   Disposition: Discharge disposition: 01-Home or Self Care          Follow-up Information     Kathryne Hitch, MD. Go on 04/05/2023.   Specialty: Orthopedic Surgery Why: at 3:00 pm for your first in office appointment with Dr. Kendrick Ranch information: [address] [place] [address] [phone]         [facility] Follow up.   Specialty: Home Health Services Why: Someone from the home health agency will be in contact with you after discharge to arrange your first in home physical therapy appointment Contact information: [address] [place] [address] [phone]  [facility]. Go on 04/05/2023.   Specialty: Rehabilitation Why: at 4:00 pm for your first Outpatient Physical Therapy appointment. You will go upstairs after meeting with Dr. Magnus Ivan for your first visit and check in at the therapy desk. Contact information: [address] [place] [address] [phone]                 Signed: Kathryne Hitch 03/24/2023, 11:15 AM

## 2023-03-24 NOTE — Plan of Care (Signed)
°  Problem: Pain Management: °Goal: Pain level will decrease with appropriate interventions °Outcome: Progressing °  °Problem: Coping: °Goal: Level of anxiety will decrease °Outcome: Progressing °  °Problem: Safety: °Goal: Ability to remain free from injury will improve °Outcome: Progressing °  °

## 2023-03-24 NOTE — Progress Notes (Signed)
Physical Therapy Treatment Patient Details Name: [name] [name] MRN: [id number] DOB: 05-27-1927 Today's Date: 03/24/2023   History of Present Illness Pt is a 89 year old female s/p R TKA on 03/22/23    PT Comments    Pt ambulated to stairs and practiced safe technique with grandson holding gait belt on stairs.  Pt able to perform mobility without physical assist however does still require cues.  Daughter has been present for sessions and educated on safe techniques.  Pt provided with HEP and stair handouts.  Pt feels ready for d/c home today.     Recommendations for follow up therapy are one component of a multi-disciplinary discharge planning process, led by the attending physician.  Recommendations may be updated based on patient status, additional functional criteria and insurance authorization.  Follow Up Recommendations       Assistance Recommended at Discharge PRN  Patient can return home with the following A little help with bathing/dressing/bathroom;Help with stairs or ramp for entrance;Assistance with cooking/housework;Assist for transportation;A little help with walking and/or transfers   Equipment Recommendations  None recommended by PT    Recommendations for Other Services       Precautions / Restrictions Precautions Precautions: Fall;Knee Precaution Comments: did not use KI Restrictions RLE Weight Bearing: Weight bearing as tolerated     Mobility  Bed Mobility Overal bed mobility: Needs Assistance Bed Mobility: Supine to Sit     Supine to sit: Min guard     General bed mobility comments: cues for technique, increased time and effort, pt able to self assist R LE out of bed    Transfers Overall transfer level: Needs assistance Equipment used: Rolling walker (2 wheels) Transfers: Sit to/from Stand Sit to Stand: Min guard           General transfer comment: multimodal cues for UE and LE positioning    Ambulation/Gait Ambulation/Gait  assistance: Min guard Gait Distance (Feet): 30 Feet Assistive device: Rolling walker (2 wheels) Gait Pattern/deviations: Step-to pattern, Decreased stance time - right, Antalgic Gait velocity: decr     General Gait Details: max multimodal cues for sequence, verbal cues for posture, increased time required   Stairs Stairs: Yes Stairs assistance: Min guard Stair Management: Forwards, Step to pattern, One rail Left, With cane Number of Stairs: 2 General stair comments: verbal cues for seqeuence, cane positioning, safety; grandson present and educated on safely assisting pt   Wheelchair Mobility    Modified Rankin (Stroke Patients Only)       Balance                                            Cognition Arousal/Alertness: Awake/alert Behavior During Therapy: WFL for tasks assessed/performed Overall Cognitive Status: Within Functional Limits for tasks assessed                                          Exercises Total Joint Exercises Ankle Circles/Pumps: AROM, Both, 10 reps Knee Flexion: AAROM, Seated, Right, 10 reps    General Comments        Pertinent Vitals/Pain Pain Assessment Pain Assessment: 0-10 Pain Score: 3  Pain Location: right knee Pain Descriptors / Indicators: Aching, Sore Pain Intervention(s): Monitored during session, Repositioned, Premedicated before session    Home Living  Prior Function            PT Goals (current goals can now be found in the care plan section) Progress towards PT goals: Progressing toward goals    Frequency    7X/week      PT Plan Current plan remains appropriate    Co-evaluation              AM-PAC PT "6 Clicks" Mobility   Outcome Measure  Help needed turning from your back to your side while in a flat bed without using bedrails?: A Little Help needed moving from lying on your back to sitting on the side of a flat bed without using  bedrails?: A Little Help needed moving to and from a bed to a chair (including a wheelchair)?: A Little Help needed standing up from a chair using your arms (e.g., wheelchair or bedside chair)?: A Little Help needed to walk in hospital room?: A Little Help needed climbing 3-5 steps with a railing? : A Little 6 Click Score: 18    End of Session Equipment Utilized During Treatment: Gait belt Activity Tolerance: Patient tolerated treatment well Patient left: with call bell/phone within reach;with family/visitor present;in chair Nurse Communication: Mobility status PT Visit Diagnosis: Difficulty in walking, not elsewhere classified (R26.2)     Time: 1610-9604 PT Time Calculation (min) (ACUTE ONLY): 26 min  Charges:  $Gait Training: 23-37 mins                    Paulino Door, DPT Physical Therapist Acute Rehabilitation Services Office: [phone]    Janan Halter Payson 03/24/2023, 3:07 PM

## 2023-03-25 DIAGNOSIS — Z471 Aftercare following joint replacement surgery: Secondary | ICD-10-CM | POA: Diagnosis not present

## 2023-03-25 DIAGNOSIS — G2581 Restless legs syndrome: Secondary | ICD-10-CM | POA: Diagnosis not present

## 2023-03-25 DIAGNOSIS — M109 Gout, unspecified: Secondary | ICD-10-CM | POA: Diagnosis not present

## 2023-03-25 DIAGNOSIS — K5900 Constipation, unspecified: Secondary | ICD-10-CM | POA: Diagnosis not present

## 2023-03-25 DIAGNOSIS — M8580 Other specified disorders of bone density and structure, unspecified site: Secondary | ICD-10-CM | POA: Diagnosis not present

## 2023-03-25 DIAGNOSIS — M47817 Spondylosis without myelopathy or radiculopathy, lumbosacral region: Secondary | ICD-10-CM | POA: Diagnosis not present

## 2023-03-25 DIAGNOSIS — I429 Cardiomyopathy, unspecified: Secondary | ICD-10-CM | POA: Diagnosis not present

## 2023-03-25 DIAGNOSIS — I10 Essential (primary) hypertension: Secondary | ICD-10-CM | POA: Diagnosis not present

## 2023-03-25 DIAGNOSIS — I493 Ventricular premature depolarization: Secondary | ICD-10-CM | POA: Diagnosis not present

## 2023-03-26 ENCOUNTER — Telehealth: Payer: Self-pay

## 2023-03-26 ENCOUNTER — Telehealth: Payer: Self-pay | Admitting: *Deleted

## 2023-03-26 NOTE — Transitions of Care (Post Inpatient/ED Visit) (Signed)
   03/26/2023  Name: ALINE WELDE MRN: [id number] DOB: 08-21-1927  Today's TOC FU Call Status: Today's TOC FU Call Status:: Unsuccessul Call (1st Attempt) Unsuccessful Call (1st Attempt) Date: 03/26/23  Attempted to reach the patient regarding the most recent Inpatient/ED visit. Spoke with daughter-[name]-states she is dealing with an emergency kitchen plumbing issue and will call back later.   Follow Up Plan: Additional outreach attempts will be made to reach the patient to complete the Transitions of Care (Post Inpatient/ED visit) call.    Antionette Fairy, RN,BSN,CCM [facility] Care Management Care Management Community Coordinator Direct Phone: [phone] Toll Free: [phone] Fax: [phone]

## 2023-03-26 NOTE — Transitions of Care (Post Inpatient/ED Visit) (Signed)
03/26/2023  Name: [name] [name] MRN: [id number] DOB: 1927/03/25  Today's TOC FU Call Status: Today's TOC FU Call Status:: Successful TOC FU Call Competed TOC FU Call Complete Date: 03/26/23 (Voicemail message received from daughter-return call placed to daughter)  Transition Care Management Follow-up Telephone Call Date of Discharge: 03/24/23 Discharge Facility: [facility]) Type of Discharge: Inpatient Admission Primary Inpatient Discharge Diagnosis:: 'unilateral primary OA right knee" How have you been since you were released from the hospital?: Better (Daughter states patient is doing well-pain managed with current regimen. She has been up ambulating with DME. Appetite very good. No BM since surgery-she gave laxative/stool softener today and will try some prunes as well.) Any questions or concerns?: Yes Patient Questions/Concerns:: daughter wanting to know if pt can shower Patient Questions/Concerns Addressed: Other: (reviewed post-op instructions including wound/dressing care per d/c instructions-daughter prefers to "be safe" and wait until after follow up appt to start assisting pt with shower)  Items Reviewed: Did you receive and understand the discharge instructions provided?: Yes Medications obtained,verified, and reconciled?: Yes (Medications Reviewed) Any new allergies since your discharge?: No Dietary orders reviewed?: Yes Type of Diet Ordered:: low salt/heart healthy Do you have support at home?: Yes People in Home: child(ren), adult Name of Support/Comfort Primary Source: [name]-daughter  Medications Reviewed Today: Medications Reviewed Today     Reviewed by Charlyn Minerva, RN (Registered Nurse) on 03/26/23 at 1515  Med List Status: <None>   Medication Order Taking? Sig Documenting Provider Last Dose Status Informant  AMBULATORY NON FORMULARY MEDICATION [id number]  Raised toilet seat Dispense 1 Dx code: M17.11 Use as needed Rodolph Bong, MD   Active Child  aspirin 81 MG chewable tablet [id number] Yes Chew 1 tablet (81 mg total) by mouth 2 (two) times daily. Kathryne Hitch, MD Taking Active   buPROPion (WELLBUTRIN XL) 150 MG 24 hr tablet [id number] Yes Take 150 mg by mouth every morning. [provider] Taking Active Child  carvedilol (COREG) 25 MG tablet [id number] Yes TAKE 1 TABLET BY MOUTH TWICE A DAY WITH FOOD Allwardt, Alyssa M, PA-C Taking Active Child  celecoxib (CELEBREX) 200 MG capsule [id number] Yes TAKE 1 CAPSULE (200 MG TOTAL) BY MOUTH 2 (TWO) TIMES DAILY BETWEEN MEALS AS NEEDED. Kathryne Hitch, MD Taking Active   diclofenac Sodium (VOLTAREN) 1 % GEL [id number] Yes Apply 4 g topically 4 (four) times daily. To affected joint. Rodolph Bong, MD Taking Active Child  furosemide (LASIX) 20 MG tablet [id number] Yes TAKE 1 TABLET BY MOUTH EVERY OTHER DAY Wendall Stade, MD Taking Active Child  GEMTESA 75 MG TABS [id number] Yes Take 75 mg by mouth daily. [provider] Taking Active Child  HYDROcodone-acetaminophen (NORCO/VICODIN) 5-325 MG tablet [id number] Yes Take 1 tablet by mouth every 6 (six) hours as needed for moderate pain (pain score 4-6). Kathryne Hitch, MD Taking Active   methocarbamol (ROBAXIN) 500 MG tablet [id number] Yes Take 1 tablet (500 mg total) by mouth every 6 (six) hours as needed.  Patient taking differently: Take 500 mg by mouth every 6 (six) hours as needed (Pain).   Kathryne Hitch, MD Taking Active Child  mirtazapine (REMERON) 30 MG tablet [id number] Yes Take 30 mg by mouth at bedtime.  Wanda Plump, MD Taking Active Child  Multiple Vitamin (MULTIVITAMIN) tablet [id number] Yes Take 1 tablet by mouth every morning. [provider] Taking Active Child  Multiple Vitamins-Minerals (BODY/HAIR/SKIN/NAILS) CAPS [id number] Yes Take 2 capsules by mouth daily. [provider]  Taking Active Child  polyethylene glycol [facility] / GLYCOLAX) packet [id number] Yes Take 17  g by mouth every Monday, Wednesday, and Friday. [provider] Taking Active Child  sacubitril-valsartan (ENTRESTO) 97-103 MG [id number] Yes Take 1 tablet by mouth 2 (two) times daily. Wendall Stade, MD Taking Active Child  spironolactone (ALDACTONE) 25 MG tablet [id number] Yes TAKE 1 TABLET BY MOUTH EVERY OTHER DAY Wendall Stade, MD Taking Active Child  VYZULTA 0.024 % SOLN [id number] Yes Place 1 drop into both eyes at bedtime. [provider] Taking Active Child            Home Care and Equipment/Supplies: Were Home Health Services Ordered?: Yes Name of Home Health Agency:: [facility] Has Agency set up a time to come to your home?: Yes First Home Health Visit Date: 03/25/23 (caregiver states therapist came out yesterday for initial eval) Any new equipment or medical supplies ordered?: NA  Functional Questionnaire: Do you need assistance with meal preparation?: Yes Do you need assistance with eating?: No Do you have difficulty maintaining continence: Yes Do you need assistance with getting out of bed/getting out of a chair/moving?: Yes Do you have difficulty managing or taking your medications?: Yes  Follow up appointments reviewed: PCP Follow-up appointment confirmed?: NA Specialist Hospital Follow-up appointment confirmed?: Yes Date of Specialist follow-up appointment?: 04/05/23 Follow-Up Specialty Provider:: Dr. Rayburn Ma Do you need transportation to your follow-up appointment?: No (daughter confirms she is able to get pt to appts) Do you understand care options if your condition(s) worsen?: Yes-patient verbalized understanding  SDOH Interventions Today    Flowsheet Row Most Recent Value  SDOH Interventions   Food Insecurity Interventions Intervention Not Indicated  Transportation Interventions Intervention Not Indicated      TOC Interventions Today    Flowsheet Row Most Recent Value  TOC Interventions   TOC Interventions Discussed/Reviewed TOC  Interventions Discussed, Post discharge activity limitations per provider, S/S of infection, Post op wound/incision care      Interventions Today    Flowsheet Row Most Recent Value  General Interventions   General Interventions Discussed/Reviewed General Interventions Discussed, Doctor Visits  Doctor Visits Discussed/Reviewed Doctor Visits Discussed, Specialist  PCP/Specialist Visits Compliance with follow-up visit  Education Interventions   Education Provided Provided Education  Provided Verbal Education On Nutrition, When to see the doctor, Medication, Other  [pain mgmt, bowel regimen]  Nutrition Interventions   Nutrition Discussed/Reviewed Nutrition Discussed  Pharmacy Interventions   Pharmacy Dicussed/Reviewed Pharmacy Topics Discussed, Medications and their functions  Safety Interventions   Safety Discussed/Reviewed Safety Discussed, Fall Risk, Home Safety  [pt gettign HHPT and will begin outpt therapy eval on 04/06/23]  Home Safety Assistive Devices       [place], [place] [facility] Care Management Care Management Community Coordinator Direct Phone: [phone] Toll Free: [phone] Fax: [phone]

## 2023-03-26 NOTE — Telephone Encounter (Signed)
Ortho bundle D/C call completed. Patient doing extremely well at home and pleased with progress after discharge home over the weekend.

## 2023-03-27 DIAGNOSIS — I10 Essential (primary) hypertension: Secondary | ICD-10-CM | POA: Diagnosis not present

## 2023-03-27 DIAGNOSIS — I429 Cardiomyopathy, unspecified: Secondary | ICD-10-CM | POA: Diagnosis not present

## 2023-03-27 DIAGNOSIS — Z471 Aftercare following joint replacement surgery: Secondary | ICD-10-CM | POA: Diagnosis not present

## 2023-03-27 DIAGNOSIS — K5900 Constipation, unspecified: Secondary | ICD-10-CM | POA: Diagnosis not present

## 2023-03-27 DIAGNOSIS — M8580 Other specified disorders of bone density and structure, unspecified site: Secondary | ICD-10-CM | POA: Diagnosis not present

## 2023-03-27 DIAGNOSIS — M109 Gout, unspecified: Secondary | ICD-10-CM | POA: Diagnosis not present

## 2023-03-27 DIAGNOSIS — I493 Ventricular premature depolarization: Secondary | ICD-10-CM | POA: Diagnosis not present

## 2023-03-27 DIAGNOSIS — G2581 Restless legs syndrome: Secondary | ICD-10-CM | POA: Diagnosis not present

## 2023-03-27 DIAGNOSIS — M47817 Spondylosis without myelopathy or radiculopathy, lumbosacral region: Secondary | ICD-10-CM | POA: Diagnosis not present

## 2023-03-29 DIAGNOSIS — M109 Gout, unspecified: Secondary | ICD-10-CM | POA: Diagnosis not present

## 2023-03-29 DIAGNOSIS — I493 Ventricular premature depolarization: Secondary | ICD-10-CM | POA: Diagnosis not present

## 2023-03-29 DIAGNOSIS — Z471 Aftercare following joint replacement surgery: Secondary | ICD-10-CM | POA: Diagnosis not present

## 2023-03-29 DIAGNOSIS — I429 Cardiomyopathy, unspecified: Secondary | ICD-10-CM | POA: Diagnosis not present

## 2023-03-29 DIAGNOSIS — G2581 Restless legs syndrome: Secondary | ICD-10-CM | POA: Diagnosis not present

## 2023-03-29 DIAGNOSIS — M47817 Spondylosis without myelopathy or radiculopathy, lumbosacral region: Secondary | ICD-10-CM | POA: Diagnosis not present

## 2023-03-29 DIAGNOSIS — I10 Essential (primary) hypertension: Secondary | ICD-10-CM | POA: Diagnosis not present

## 2023-03-29 DIAGNOSIS — M8580 Other specified disorders of bone density and structure, unspecified site: Secondary | ICD-10-CM | POA: Diagnosis not present

## 2023-03-29 DIAGNOSIS — K5900 Constipation, unspecified: Secondary | ICD-10-CM | POA: Diagnosis not present

## 2023-03-30 ENCOUNTER — Other Ambulatory Visit: Payer: Self-pay | Admitting: Orthopaedic Surgery

## 2023-03-30 ENCOUNTER — Telehealth: Payer: Self-pay | Admitting: *Deleted

## 2023-03-30 DIAGNOSIS — I493 Ventricular premature depolarization: Secondary | ICD-10-CM | POA: Diagnosis not present

## 2023-03-30 DIAGNOSIS — I10 Essential (primary) hypertension: Secondary | ICD-10-CM | POA: Diagnosis not present

## 2023-03-30 DIAGNOSIS — I429 Cardiomyopathy, unspecified: Secondary | ICD-10-CM | POA: Diagnosis not present

## 2023-03-30 DIAGNOSIS — G2581 Restless legs syndrome: Secondary | ICD-10-CM | POA: Diagnosis not present

## 2023-03-30 DIAGNOSIS — K5900 Constipation, unspecified: Secondary | ICD-10-CM | POA: Diagnosis not present

## 2023-03-30 DIAGNOSIS — M47817 Spondylosis without myelopathy or radiculopathy, lumbosacral region: Secondary | ICD-10-CM | POA: Diagnosis not present

## 2023-03-30 DIAGNOSIS — Z471 Aftercare following joint replacement surgery: Secondary | ICD-10-CM | POA: Diagnosis not present

## 2023-03-30 DIAGNOSIS — M109 Gout, unspecified: Secondary | ICD-10-CM | POA: Diagnosis not present

## 2023-03-30 DIAGNOSIS — M8580 Other specified disorders of bone density and structure, unspecified site: Secondary | ICD-10-CM | POA: Diagnosis not present

## 2023-03-30 MED ORDER — METHOCARBAMOL 500 MG PO TABS: 500.0000 mg | ORAL_TABLET | Freq: Four times a day (QID) | ORAL | 1 refills | Status: DC | PRN

## 2023-03-30 MED ORDER — HYDROCODONE-ACETAMINOPHEN 5-325 MG PO TABS: 1.0000 | ORAL_TABLET | Freq: Four times a day (QID) | ORAL | 0 refills | Status: DC | PRN

## 2023-03-30 NOTE — Telephone Encounter (Signed)
Spoke to patient and is doing well with HHPT. Asked about pain medication and if she could take 2 Norco (it is ordered 1 every 6). I know she is 25 and I told her I wanted to see what you said about that. She is waking up with shocking/electric type pain through the knee. Also she will need refill of both Methocarbamol and Norco. [facility]. Thank you.

## 2023-04-02 ENCOUNTER — Telehealth: Payer: Self-pay | Admitting: Orthopaedic Surgery

## 2023-04-02 DIAGNOSIS — G2581 Restless legs syndrome: Secondary | ICD-10-CM | POA: Diagnosis not present

## 2023-04-02 DIAGNOSIS — K5900 Constipation, unspecified: Secondary | ICD-10-CM | POA: Diagnosis not present

## 2023-04-02 DIAGNOSIS — I10 Essential (primary) hypertension: Secondary | ICD-10-CM | POA: Diagnosis not present

## 2023-04-02 DIAGNOSIS — M47817 Spondylosis without myelopathy or radiculopathy, lumbosacral region: Secondary | ICD-10-CM | POA: Diagnosis not present

## 2023-04-02 DIAGNOSIS — I493 Ventricular premature depolarization: Secondary | ICD-10-CM | POA: Diagnosis not present

## 2023-04-02 DIAGNOSIS — Z471 Aftercare following joint replacement surgery: Secondary | ICD-10-CM | POA: Diagnosis not present

## 2023-04-02 DIAGNOSIS — I429 Cardiomyopathy, unspecified: Secondary | ICD-10-CM | POA: Diagnosis not present

## 2023-04-02 DIAGNOSIS — M109 Gout, unspecified: Secondary | ICD-10-CM | POA: Diagnosis not present

## 2023-04-02 DIAGNOSIS — M8580 Other specified disorders of bone density and structure, unspecified site: Secondary | ICD-10-CM | POA: Diagnosis not present

## 2023-04-02 NOTE — Telephone Encounter (Signed)
Error corrected on her chart

## 2023-04-02 NOTE — Telephone Encounter (Signed)
[name] [name] (PT) from [facility] called with an FYI. Error on pt chart. State pt is allergic to hydrocodone. Pt is taking hydrocodone with no issue. No call back needed. [name] [name] phone number is [phone]

## 2023-04-04 DIAGNOSIS — I10 Essential (primary) hypertension: Secondary | ICD-10-CM | POA: Diagnosis not present

## 2023-04-04 DIAGNOSIS — M109 Gout, unspecified: Secondary | ICD-10-CM | POA: Diagnosis not present

## 2023-04-04 DIAGNOSIS — I429 Cardiomyopathy, unspecified: Secondary | ICD-10-CM | POA: Diagnosis not present

## 2023-04-04 DIAGNOSIS — M47817 Spondylosis without myelopathy or radiculopathy, lumbosacral region: Secondary | ICD-10-CM | POA: Diagnosis not present

## 2023-04-04 DIAGNOSIS — M8580 Other specified disorders of bone density and structure, unspecified site: Secondary | ICD-10-CM | POA: Diagnosis not present

## 2023-04-04 DIAGNOSIS — G2581 Restless legs syndrome: Secondary | ICD-10-CM | POA: Diagnosis not present

## 2023-04-04 DIAGNOSIS — I493 Ventricular premature depolarization: Secondary | ICD-10-CM | POA: Diagnosis not present

## 2023-04-04 DIAGNOSIS — K5900 Constipation, unspecified: Secondary | ICD-10-CM | POA: Diagnosis not present

## 2023-04-04 DIAGNOSIS — Z471 Aftercare following joint replacement surgery: Secondary | ICD-10-CM | POA: Diagnosis not present

## 2023-04-05 ENCOUNTER — Ambulatory Visit (INDEPENDENT_AMBULATORY_CARE_PROVIDER_SITE_OTHER): Payer: Medicare PPO | Admitting: Orthopaedic Surgery

## 2023-04-05 ENCOUNTER — Other Ambulatory Visit: Payer: Self-pay

## 2023-04-05 ENCOUNTER — Encounter: Payer: Self-pay | Admitting: Rehabilitative and Restorative Service Providers"

## 2023-04-05 ENCOUNTER — Encounter: Payer: Self-pay | Admitting: Orthopaedic Surgery

## 2023-04-05 ENCOUNTER — Ambulatory Visit: Payer: Medicare PPO | Admitting: Rehabilitative and Restorative Service Providers"

## 2023-04-05 DIAGNOSIS — M25561 Pain in right knee: Secondary | ICD-10-CM

## 2023-04-05 DIAGNOSIS — G8929 Other chronic pain: Secondary | ICD-10-CM

## 2023-04-05 DIAGNOSIS — R600 Localized edema: Secondary | ICD-10-CM | POA: Diagnosis not present

## 2023-04-05 DIAGNOSIS — M6281 Muscle weakness (generalized): Secondary | ICD-10-CM | POA: Diagnosis not present

## 2023-04-05 DIAGNOSIS — R262 Difficulty in walking, not elsewhere classified: Secondary | ICD-10-CM | POA: Diagnosis not present

## 2023-04-05 DIAGNOSIS — Z96651 Presence of right artificial knee joint: Secondary | ICD-10-CM

## 2023-04-05 NOTE — Therapy (Signed)
OUTPATIENT PHYSICAL THERAPY EVALUATION   Patient Name: [name] [name] MRN: [id number] DOB:[birth year], 89 y.o., female Today's Date: 04/05/2023  END OF SESSION:  PT End of Session - 04/05/23 1557     Visit Number 1    Number of Visits 24    Date for PT Re-Evaluation 06/28/23    Authorization Type [employer] $20 copay    Authorization - Visit Number 1    Authorization - Number of Visits 12    PT Start Time 1605    Activity Tolerance Patient limited by pain    Behavior During Therapy [facility] for tasks assessed/performed             Past Medical History:  Diagnosis Date   Anemia    Anxiety and depression 07/16/2009   ASD (atrial septal defect) per bubble study, 2004    Cardiomyopathy, normal cath 2007, normal EF per ECHO 2014    Hx of colonic polyps    Hypertension    Osteoarthritis    Osteopenia    Podagra 04/29/2014   RLS (restless legs syndrome) 07/10/2009   Past Surgical History:  Procedure Laterality Date   BACK SURGERY  2000   BREAST SURGERY     NEGATIVE BIOPSY   TONSILLECTOMY     TOTAL KNEE ARTHROPLASTY Right 03/22/2023   Procedure: RIGHT TOTAL KNEE ARTHROPLASTY;  Surgeon: Kathryne Hitch, MD;  Location: [facility];  Service: Orthopedics;  Laterality: Right;   Patient Active Problem List   Diagnosis Date Noted   Status post total right knee replacement 03/22/2023   Depression, recurrent (HCC) 02/20/2020   Prediabetes 01/26/2019   History of exercise intolerance 01/26/2019   HLD (hyperlipidemia) 04/07/2018   DJD (degenerative joint disease) 02/09/2010   PVCs (premature ventricular contractions) 07/20/2009   Anxiety and depression 07/16/2009   Cardiomyopathy (HCC) 04/23/2007   HTN (hypertension) 04/15/2007   Osteopenia 04/15/2007    PCP: Bary Leriche PA-C  REFERRING PROVIDER: Kathryne Hitch, MD  REFERRING DIAG: M17.11 (ICD-10-CM) - Primary osteoarthritis of right knee  THERAPY DIAG:  Chronic pain of right knee  Muscle weakness  (generalized)  Difficulty in walking, not elsewhere classified  Localized edema  Rationale for Evaluation and Treatment: Rehabilitation  ONSET DATE: 03/22/2023 Rt TKA  SUBJECTIVE:    SUBJECTIVE STATEMENT: 03/21/2023 Rt TKA with home health up until yesterday.  Pt ambulated c FWW.  Pt reported prolonged sitting causes tightness.  Sleeping in recliner due to pains.    Was doing exercises outside of home health visits.   Attended session with daughter.    PERTINENT HISTORY: HTN, OA, osteopenia, history of restless leg syndrome   PAIN:  NPRS scale: at worst 8/10, at current at rest 2/10 Pain location: Rt knee  Pain description: tightness, achy, constant Aggravating factors: static positioning, lying in bed, end range pains.  Relieving factors: pain medicine, icing   PRECAUTIONS: None   WEIGHT BEARING RESTRICTIONS: No   FALLS:  Has patient fallen in last 6 months? No   LIVING ENVIRONMENT: Lives with: lives with their family Lives in: House/apartment Stairs: flight of stairs to bedroom, rail on Lt going up.  Entry to house 3 stairs on Lt going up.  Has following equipment at home: FWW, SPC    OCCUPATION: Retired   PLOF: Independent, reading, meditation, watch tv.   Went to church, retirement groups   PATIENT GOALS: Return to activity, reduce pain   Next MD visit:  approx 4 weeks from 04/05/2023 MD visit.  OBJECTIVE:    PATIENT SURVEYS:  04/05/2023 FOTO intake: 23   predicted:  45   COGNITION: 04/05/2023 Overall cognitive status: WFL                        SENSATION: 04/05/2023 Not tested   EDEMA:  04/05/2023 Localized edema in Rt knee into lower leg.    MUSCLE LENGTH: 04/05/2023 No specific measurements   POSTURE:  04/05/2023:  forward trunk lean, rounded shoulders.    PALPATION: 04/05/2023 General tenderness around Rt knee, incision, guarding in Rt quad   LOWER EXTREMITY ROM:    ROM Right 04/05/2023 Left 04/05/2023  Hip flexion      Hip extension       Hip abduction      Hip adduction      Hip internal rotation      Hip external rotation      Knee flexion 56 AROM in supine heel slide    Knee extension -18 AROM in seated LAQ   PROM in supine heel prop     Ankle dorsiflexion      Ankle plantarflexion      Ankle inversion      Ankle eversion      04/05/2023:  pain noted in end ranges both direction Rt knee   (Blank rows = not tested)   LOWER EXTREMITY MMT:   MMT Right 04/05/2023 Left 04/05/2023  Hip flexion  3+/5  5/5  Hip extension      Hip abduction      Hip adduction      Hip internal rotation      Hip external rotation      Knee flexion 3+/5   5/5  Knee extension  2+/5  5/5  Ankle dorsiflexion  4/5 5/5   Ankle plantarflexion      Ankle inversion      Ankle eversion       (Blank rows = not tested)   LOWER EXTREMITY SPECIAL TESTS:  04/05/2023 No specific testing   FUNCTIONAL TESTS:  04/05/2023 18 inch chair transfer:  UE required, deviation to Lt leg WB  Rt SLS: unable unassisted   GAIT: 04/05/2023 FWW ambulation c reduced gait speed, step length bilateral, maintained knee flexion in stance lacking TKE.  Hip circumduction in swing to clear foot due to flexion restrictions.      TODAY'S TREATMENT                                                                          DATE:04/05/2023 Therex:    HEP instruction/performance c cues for techniques, handout provided.  Trial set performed of each for comprehension and symptom assessment.  See below for exercise list   PATIENT EDUCATION:  04/05/2023 Education details: HEP, POC Person educated: Patient Education method: Explanation, Demonstration, Verbal cues, and Handouts Education comprehension: verbalized understanding, returned demonstration, and verbal cues required   HOME EXERCISE PROGRAM: Access Code: [email or web address] URL: [email or web address] Date: 04/05/2023 Prepared by: Chyrel Masson  Exercises - Supine Heel Slide  - 3-5 x daily - 7 x weekly  - 1 sets - 10 reps - 5 hold - Seated Long Arc Quad  - 3-5 x daily - 7 x weekly -  1 sets - 5-10 reps - 2 hold - Seated Knee Flexion Extension AAROM with Overpressure  - 3-5 x daily - 7 x weekly - 1 sets - 5 reps - 10 hold - Supine Knee Extension Mobilization with Weight  - 3-5 x daily - 7 x weekly - 1 sets - 1 reps - to tolerance up to 15 mins hold - Seated Quad Set  - 3-5 x daily - 7 x weekly - 1 sets - 10 reps - 5 hold   ASSESSMENT:   CLINICAL IMPRESSION: Patient is a 89 y.o. who comes to clinic with complaints of Rt knee pain s/p recent TKA 03/22/2023 with mobility, strength and movement coordination deficits that impair their ability to perform usual daily and recreational functional activities without increase difficulty/symptoms at this time.  Patient to benefit from skilled PT services to address impairments and limitations to improve to previous level of function without restriction secondary to condition.    OBJECTIVE IMPAIRMENTS: Abnormal gait, decreased activity tolerance, decreased balance, decreased coordination, decreased endurance, decreased mobility, difficulty walking, decreased ROM, decreased strength, hypomobility, increased edema, increased fascial restrictions, impaired perceived functional ability, increased muscle spasms, impaired flexibility, improper body mechanics, and pain.    ACTIVITY LIMITATIONS: carrying, lifting, bending, sitting, standing, squatting, sleeping, stairs, transfers, bed mobility, and locomotion level   PARTICIPATION LIMITATIONS: meal prep, cleaning, laundry, interpersonal relationship, shopping, and community activity   PERSONAL FACTORS: Age and HTN, OA, osteopenia, history of restless leg syndrome are also affecting patient's functional outcome.    REHAB POTENTIAL: Good   CLINICAL DECISION MAKING: Stable/uncomplicated   EVALUATION COMPLEXITY: Low     GOALS: Goals reviewed with patient? Yes   SHORT TERM GOALS: (target date for Short term goals  are 3 weeks 04/26/2023)    1.  Patient will demonstrate independent use of home exercise program to maintain progress from in clinic treatments.   Goal status: New   LONG TERM GOALS: (target dates for all long term goals are 12 weeks  06/28/2023 )   1. Patient will demonstrate/report pain at worst less than or equal to 2/10 to facilitate minimal limitation in daily activity secondary to pain symptoms.   Goal status: New   2. Patient will demonstrate independent use of home exercise program to facilitate ability to maintain/progress functional gains from skilled physical therapy services.   Goal status: New   3. Patient will demonstrate FOTO outcome > or = 45 % to indicate reduced disability due to condition.   Goal status: New   4.  Patient will demonstrate Rt LE MMT 5/5 throughout to faciltiate usual transfers, stairs, squatting at [facility] for daily life.    Goal status: New   5.  Patient will demonstrate Rt knee AROM 0-110 degrees to facilitate mobility for transfers, ambulation, and other daily activity at PLOF.  Goal status: New   6.  Patient will demonstrate independent ambulation community distances > 500 ft for community integration.  Goal status: New   7.  Patient will demonstrate ascending/descending stairs reciprocally with one hand UE assist for community integration.    Goal Status: New     PLAN:   PT FREQUENCY: 1-2x/week   PT DURATION: 12 weeks   PLANNED INTERVENTIONS: Therapeutic exercises, Therapeutic activity, Neuro Muscular re-education, Balance training, Gait training, Patient/Family education, Joint mobilization, Stair training, DME instructions, Dry Needling, Electrical stimulation, Traction, Cryotherapy, vasopneumatic deviceMoist heat, Taping, Ultrasound, Ionotophoresis 4mg /ml Dexamethasone, and aquatic therapy, Manual therapy.  All included unless contraindicated   PLAN FOR  NEXT SESSION: Review HEP knowledge/results.  Progressive ROM gains, strength, static  balance improvements.    Chyrel Masson, PT, DPT, OCS, ATC 04/05/23  4:40 PM    Referring diagnosis? M17.11 (ICD-10-CM) - Primary osteoarthritis of right knee Treatment diagnosis? (if different than referring diagnosis) M25.561 What was this (referring dx) caused by? [x]  Surgery []  Fall []  Ongoing issue []  Arthritis []  Other: ____________  Laterality: [x]  Rt []  Lt []  Both  Check all possible CPT codes:  *CHOOSE 10 OR LESS*    []  97110 (Therapeutic Exercise)  []  92507 (SLP Treatment)  []  97112 (Neuro Re-ed)   []  92526 (Swallowing Treatment)   []  97116 (Gait Training)   []  [id number] (Cognitive Training, 1st 15 minutes) []  97140 (Manual Therapy)   []  97130 (Cognitive Training, each add'l 15 minutes)  []  97164 (Re-evaluation)                              []  Other, List CPT Code ____________  []  97530 (Therapeutic Activities)     []  97535 (Self Care)   [x]  All codes above (97110 - 97535)  []  97012 (Mechanical Traction)  []  97014 (E-stim Unattended)  [x]  97032 (E-stim manual)  []  97033 (Ionto)  []  97035 (Ultrasound) [x]  97750 (Physical Performance Training) []  [id number] (Aquatic Therapy) [x]  97016 (Vasopneumatic Device) []  [id number] (Paraffin) []  97034 (Contrast Bath) []  97597 (Wound Care 1st 20 sq cm) []  97598 (Wound Care each add'l 20 sq cm) []  97760 (Orthotic Fabrication, Fitting, Training Initial) []  [id number] (Prosthetic Management and Training Initial) []  86578 (Orthotic or Prosthetic Training/ Modification Subsequent)

## 2023-04-05 NOTE — Progress Notes (Signed)
The patient is a 89 year old female who is here today for first postoperative visit status post a right total knee arthroplasty.  Her daughter is with her today.  She has been ambulating with a rolling walker.  She has been compliant with a baby aspirin twice daily.  On exam the staples have been removed from her right knee incision and Steri-Strips applied.  Her knee is swollen to be expected.  She lacks full extension by several degrees and I could only flex her to maybe 60 degrees of that.  Her calf is soft.  She is wearing TED hose.  She does have appropriate swelling in the foot and ankle.  She understands the importance of outpatient physical therapy and working on getting her knee bending and moving.  Usually at this age group does well in the long run.  Will see her back in 4 weeks to see how she is doing overall.  All questions and concerns were answered and addressed.

## 2023-04-06 ENCOUNTER — Telehealth: Payer: Self-pay | Admitting: *Deleted

## 2023-04-06 ENCOUNTER — Other Ambulatory Visit: Payer: Self-pay | Admitting: Orthopaedic Surgery

## 2023-04-06 ENCOUNTER — Telehealth: Payer: Self-pay | Admitting: Orthopaedic Surgery

## 2023-04-06 MED ORDER — HYDROCODONE-ACETAMINOPHEN 5-325 MG PO TABS: 1.0000 | ORAL_TABLET | Freq: Four times a day (QID) | ORAL | 0 refills | Status: DC | PRN

## 2023-04-06 NOTE — Telephone Encounter (Signed)
Updated patient.

## 2023-04-06 NOTE — Telephone Encounter (Signed)
Patient is in need of Hydrocodone refill will not make it until Monday I advised Pt [name] [name] is not currently in office and he may or may not be able to get the refill done.

## 2023-04-06 NOTE — Telephone Encounter (Signed)
This may be a duplicate message, but patient did need a refill of Hydrocodone before the weekend. She didn't this she did yesterday when we discussed in office. Thank you.

## 2023-04-06 NOTE — Telephone Encounter (Signed)
Ortho bundle 14 day in office visit completed. 

## 2023-04-10 ENCOUNTER — Encounter: Payer: Self-pay | Admitting: Physical Therapy

## 2023-04-10 ENCOUNTER — Ambulatory Visit: Payer: Medicare PPO | Admitting: Physical Therapy

## 2023-04-10 ENCOUNTER — Encounter: Payer: Medicare PPO | Admitting: Rehabilitative and Restorative Service Providers"

## 2023-04-10 DIAGNOSIS — G8929 Other chronic pain: Secondary | ICD-10-CM | POA: Diagnosis not present

## 2023-04-10 DIAGNOSIS — M25561 Pain in right knee: Secondary | ICD-10-CM

## 2023-04-10 DIAGNOSIS — M6281 Muscle weakness (generalized): Secondary | ICD-10-CM

## 2023-04-10 DIAGNOSIS — R600 Localized edema: Secondary | ICD-10-CM | POA: Diagnosis not present

## 2023-04-10 DIAGNOSIS — R262 Difficulty in walking, not elsewhere classified: Secondary | ICD-10-CM | POA: Diagnosis not present

## 2023-04-10 NOTE — Therapy (Signed)
OUTPATIENT PHYSICAL THERAPY TREATMENT   Patient Name: [name] [name] MRN: [id number] DOB:1927-08-04, 89 y.o., female Today's Date: 04/10/2023  END OF SESSION:  PT End of Session - 04/10/23 0949     Visit Number 2    Number of Visits 24    Date for PT Re-Evaluation 06/28/23    Authorization Type [employer] $20 copay    Authorization Time Period 12 visits until 8/15    Authorization - Visit Number 2    Authorization - Number of Visits 12    PT Start Time 0945    PT Stop Time 1025    PT Time Calculation (min) 40 min    Activity Tolerance Patient limited by pain    Behavior During Therapy [facility] for tasks assessed/performed             Past Medical History:  Diagnosis Date   Anemia    Anxiety and depression 07/16/2009   ASD (atrial septal defect) per bubble study, 2004    Cardiomyopathy, normal cath 2007, normal EF per ECHO 2014    Hx of colonic polyps    Hypertension    Osteoarthritis    Osteopenia    Podagra 04/29/2014   RLS (restless legs syndrome) 07/10/2009   Past Surgical History:  Procedure Laterality Date   BACK SURGERY  2000   BREAST SURGERY     NEGATIVE BIOPSY   TONSILLECTOMY     TOTAL KNEE ARTHROPLASTY Right 03/22/2023   Procedure: RIGHT TOTAL KNEE ARTHROPLASTY;  Surgeon: Kathryne Hitch, MD;  Location: [facility];  Service: Orthopedics;  Laterality: Right;   Patient Active Problem List   Diagnosis Date Noted   Status post total right knee replacement 03/22/2023   Depression, recurrent (HCC) 02/20/2020   Prediabetes 01/26/2019   History of exercise intolerance 01/26/2019   HLD (hyperlipidemia) 04/07/2018   DJD (degenerative joint disease) 02/09/2010   PVCs (premature ventricular contractions) 07/20/2009   Anxiety and depression 07/16/2009   Cardiomyopathy (HCC) 04/23/2007   HTN (hypertension) 04/15/2007   Osteopenia 04/15/2007    PCP: Bary Leriche PA-C  REFERRING PROVIDER: Kathryne Hitch, MD  REFERRING DIAG: M17.11  (ICD-10-CM) - Primary osteoarthritis of right knee  THERAPY DIAG:  Chronic pain of right knee  Muscle weakness (generalized)  Difficulty in walking, not elsewhere classified  Localized edema  Rationale for Evaluation and Treatment: Rehabilitation  ONSET DATE: 03/22/2023 Rt TKA  SUBJECTIVE:    SUBJECTIVE STATEMENT: Reports she was late due to leg swelling and trying to get her shoe on. Doing okay with pain overall  Attended session with daughter.    PERTINENT HISTORY: HTN, OA, osteopenia, history of restless leg syndrome   PAIN:  NPRS scale:  current at rest 4/10 Pain location: Rt knee  Pain description: tightness, achy, constant Aggravating factors: static positioning, lying in bed, end range pains.  Relieving factors: pain medicine, icing   PRECAUTIONS: None   WEIGHT BEARING RESTRICTIONS: No   FALLS:  Has patient fallen in last 6 months? No   LIVING ENVIRONMENT: Lives with: lives with their family Lives in: House/apartment Stairs: flight of stairs to bedroom, rail on Lt going up.  Entry to house 3 stairs on Lt going up.  Has following equipment at home: FWW, SPC    OCCUPATION: Retired   PLOF: Independent, reading, meditation, watch tv.   Went to church, retirement groups   PATIENT GOALS: Return to activity, reduce pain   Next MD visit:  approx 4 weeks from 04/05/2023 MD visit.  OBJECTIVE:    PATIENT SURVEYS:  04/05/2023 FOTO intake: 23   predicted:  45   COGNITION: 04/05/2023 Overall cognitive status: WFL                        SENSATION: 04/05/2023 Not tested   EDEMA:  04/05/2023 Localized edema in Rt knee into lower leg.    MUSCLE LENGTH: 04/05/2023 No specific measurements   POSTURE:  04/05/2023:  forward trunk lean, rounded shoulders.    PALPATION: 04/05/2023 General tenderness around Rt knee, incision, guarding in Rt quad   LOWER EXTREMITY ROM:    ROM Right 04/05/2023   Hip flexion      Hip extension      Hip abduction      Hip  adduction      Hip internal rotation      Hip external rotation      Knee flexion 56 AROM in supine heel slide    Knee extension -18 AROM in seated LAQ   PROM in supine heel prop     Ankle dorsiflexion      Ankle plantarflexion      Ankle inversion      Ankle eversion      04/05/2023:  pain noted in end ranges both direction Rt knee   (Blank rows = not tested)   LOWER EXTREMITY MMT:   MMT Right 04/05/2023 Left 04/05/2023  Hip flexion  3+/5  5/5  Hip extension      Hip abduction      Hip adduction      Hip internal rotation      Hip external rotation      Knee flexion 3+/5   5/5  Knee extension  2+/5  5/5  Ankle dorsiflexion  4/5 5/5   Ankle plantarflexion      Ankle inversion      Ankle eversion       (Blank rows = not tested)   LOWER EXTREMITY SPECIAL TESTS:  04/05/2023 No specific testing   FUNCTIONAL TESTS:  04/05/2023 18 inch chair transfer:  UE required, deviation to Lt leg WB  Rt SLS: unable unassisted   GAIT: 04/05/2023 FWW ambulation c reduced gait speed, step length bilateral, maintained knee flexion in stance lacking TKE.  Hip circumduction in swing to clear foot due to flexion restrictions.      TODAY'S TREATMENT                                                                          DATE: 04/10/23 Seated Rt knee AROM flexion and extension alternating legs X 2 min Seated Rt knee flexion AAROM 5 sec hold X 2 min Sit to stands with RW support, 2X5 Nu step L5 X 5 min seat #9 UE/LE Gait 50 feet X 2 with RW, supervision  Manual therapy: Rt knee PROM with overpressure to tolerance flexion and extension   -Vasopnuematic device X 10 min, medium compression, 34 deg to Rt knee   04/05/2023 Therex:    HEP instruction/performance c cues for techniques, handout provided.  Trial set performed of each for comprehension and symptom assessment.  See below for exercise list   PATIENT EDUCATION:  04/05/2023 Education details: HEP,  POC Person educated:  Patient Education method: Explanation, Demonstration, Verbal cues, and Handouts Education comprehension: verbalized understanding, returned demonstration, and verbal cues required   HOME EXERCISE PROGRAM: Access Code: [email or web address] URL: [email or web address] Date: 04/05/2023 Prepared by: Chyrel Masson  Exercises - Supine Heel Slide  - 3-5 x daily - 7 x weekly - 1 sets - 10 reps - 5 hold - Seated Long Arc Quad  - 3-5 x daily - 7 x weekly - 1 sets - 5-10 reps - 2 hold - Seated Knee Flexion Extension AAROM with Overpressure  - 3-5 x daily - 7 x weekly - 1 sets - 5 reps - 10 hold - Supine Knee Extension Mobilization with Weight  - 3-5 x daily - 7 x weekly - 1 sets - 1 reps - to tolerance up to 15 mins hold - Seated Quad Set  - 3-5 x daily - 7 x weekly - 1 sets - 10 reps - 5 hold   ASSESSMENT:   CLINICAL IMPRESSION: ROM did appear to increase some after manual therapy but still overall limited with this and will benefit from further PT. She and her her daughter show good understanding of HEP   OBJECTIVE IMPAIRMENTS: Abnormal gait, decreased activity tolerance, decreased balance, decreased coordination, decreased endurance, decreased mobility, difficulty walking, decreased ROM, decreased strength, hypomobility, increased edema, increased fascial restrictions, impaired perceived functional ability, increased muscle spasms, impaired flexibility, improper body mechanics, and pain.    ACTIVITY LIMITATIONS: carrying, lifting, bending, sitting, standing, squatting, sleeping, stairs, transfers, bed mobility, and locomotion level   PARTICIPATION LIMITATIONS: meal prep, cleaning, laundry, interpersonal relationship, shopping, and community activity   PERSONAL FACTORS: Age and HTN, OA, osteopenia, history of restless leg syndrome are also affecting patient's functional outcome.    REHAB POTENTIAL: Good   CLINICAL DECISION MAKING: Stable/uncomplicated   EVALUATION COMPLEXITY: Low      GOALS: Goals reviewed with patient? Yes   SHORT TERM GOALS: (target date for Short term goals are 3 weeks 04/26/2023)    1.  Patient will demonstrate independent use of home exercise program to maintain progress from in clinic treatments.   Goal status: New   LONG TERM GOALS: (target dates for all long term goals are 12 weeks  06/28/2023 )   1. Patient will demonstrate/report pain at worst less than or equal to 2/10 to facilitate minimal limitation in daily activity secondary to pain symptoms.   Goal status: New   2. Patient will demonstrate independent use of home exercise program to facilitate ability to maintain/progress functional gains from skilled physical therapy services.   Goal status: New   3. Patient will demonstrate FOTO outcome > or = 45 % to indicate reduced disability due to condition.   Goal status: New   4.  Patient will demonstrate Rt LE MMT 5/5 throughout to faciltiate usual transfers, stairs, squatting at [facility] for daily life.    Goal status: New   5.  Patient will demonstrate Rt knee AROM 0-110 degrees to facilitate mobility for transfers, ambulation, and other daily activity at PLOF.  Goal status: New   6.  Patient will demonstrate independent ambulation community distances > 500 ft for community integration.  Goal status: New   7.  Patient will demonstrate ascending/descending stairs reciprocally with one hand UE assist for community integration.    Goal Status: New     PLAN:   PT FREQUENCY: 1-2x/week   PT DURATION: 12 weeks   PLANNED INTERVENTIONS: Therapeutic exercises, Therapeutic activity, Neuro Muscular re-education, Balance  training, Gait training, Patient/Family education, Joint mobilization, Stair training, DME instructions, Dry Needling, Electrical stimulation, Traction, Cryotherapy, vasopneumatic deviceMoist heat, Taping, Ultrasound, Ionotophoresis 4mg /ml Dexamethasone, and aquatic therapy, Manual therapy.  All included unless  contraindicated   PLAN FOR NEXT SESSION: Progressive ROM gains, strength, static balance improvements.   Ivery Quale, PT, DPT 04/10/23 10:15 AM     Referring diagnosis? M17.11 (ICD-10-CM) - Primary osteoarthritis of right knee Treatment diagnosis? (if different than referring diagnosis) M25.561 What was this (referring dx) caused by? [x]  Surgery []  Fall []  Ongoing issue []  Arthritis []  Other: ____________  Laterality: [x]  Rt []  Lt []  Both  Check all possible CPT codes:  *CHOOSE 10 OR LESS*    []  97110 (Therapeutic Exercise)  []  92507 (SLP Treatment)  []  97112 (Neuro Re-ed)   []  92526 (Swallowing Treatment)   []  97116 (Gait Training)   []  [id number] (Cognitive Training, 1st 15 minutes) []  97140 (Manual Therapy)   []  97130 (Cognitive Training, each add'l 15 minutes)  []  97164 (Re-evaluation)                              []  Other, List CPT Code ____________  []  97530 (Therapeutic Activities)     []  97535 (Self Care)   [x]  All codes above (97110 - 97535)  []  97012 (Mechanical Traction)  []  97014 (E-stim Unattended)  [x]  97032 (E-stim manual)  []  97033 (Ionto)  []  97035 (Ultrasound) [x]  97750 (Physical Performance Training) []  [id number] (Aquatic Therapy) [x]  97016 (Vasopneumatic Device) []  [id number] (Paraffin) []  97034 (Contrast Bath) []  16109 (Wound Care 1st 20 sq cm) []  97598 (Wound Care each add'l 20 sq cm) []  97760 (Orthotic Fabrication, Fitting, Training Initial) []  [id number] (Prosthetic Management and Training Initial) []  [id number] (Orthotic or Prosthetic Training/ Modification Subsequent)

## 2023-04-12 ENCOUNTER — Encounter: Payer: Self-pay | Admitting: Physical Therapy

## 2023-04-12 ENCOUNTER — Ambulatory Visit: Payer: Medicare PPO | Admitting: Physical Therapy

## 2023-04-12 DIAGNOSIS — G8929 Other chronic pain: Secondary | ICD-10-CM

## 2023-04-12 DIAGNOSIS — M25561 Pain in right knee: Secondary | ICD-10-CM | POA: Diagnosis not present

## 2023-04-12 DIAGNOSIS — R262 Difficulty in walking, not elsewhere classified: Secondary | ICD-10-CM | POA: Diagnosis not present

## 2023-04-12 DIAGNOSIS — M6281 Muscle weakness (generalized): Secondary | ICD-10-CM | POA: Diagnosis not present

## 2023-04-12 DIAGNOSIS — R600 Localized edema: Secondary | ICD-10-CM

## 2023-04-12 NOTE — Therapy (Signed)
OUTPATIENT PHYSICAL THERAPY TREATMENT   Patient Name: [name] [name] MRN: [id number] DOB:08/23/1927, 89 y.o., female Today's Date: 04/12/2023  END OF SESSION:  PT End of Session - 04/12/23 1114     Visit Number 3    Number of Visits 24    Date for PT Re-Evaluation 06/28/23    Authorization Type [employer] $20 copay    Authorization Time Period 12 visits until 8/15    Authorization - Visit Number 3    Authorization - Number of Visits 12    PT Start Time 1103    PT Stop Time 1142    PT Time Calculation (min) 39 min    Activity Tolerance Patient limited by pain;Patient tolerated treatment well    Behavior During Therapy [facility] for tasks assessed/performed              Past Medical History:  Diagnosis Date   Anemia    Anxiety and depression 07/16/2009   ASD (atrial septal defect) per bubble study, 2004    Cardiomyopathy, normal cath 2007, normal EF per ECHO 2014    Hx of colonic polyps    Hypertension    Osteoarthritis    Osteopenia    Podagra 04/29/2014   RLS (restless legs syndrome) 07/10/2009   Past Surgical History:  Procedure Laterality Date   BACK SURGERY  2000   BREAST SURGERY     NEGATIVE BIOPSY   TONSILLECTOMY     TOTAL KNEE ARTHROPLASTY Right 03/22/2023   Procedure: RIGHT TOTAL KNEE ARTHROPLASTY;  Surgeon: Kathryne Hitch, MD;  Location: [facility];  Service: Orthopedics;  Laterality: Right;   Patient Active Problem List   Diagnosis Date Noted   Status post total right knee replacement 03/22/2023   Depression, recurrent (HCC) 02/20/2020   Prediabetes 01/26/2019   History of exercise intolerance 01/26/2019   HLD (hyperlipidemia) 04/07/2018   DJD (degenerative joint disease) 02/09/2010   PVCs (premature ventricular contractions) 07/20/2009   Anxiety and depression 07/16/2009   Cardiomyopathy (HCC) 04/23/2007   HTN (hypertension) 04/15/2007   Osteopenia 04/15/2007    PCP: Bary Leriche PA-C  REFERRING PROVIDER: Kathryne Hitch,  MD  REFERRING DIAG: M17.11 (ICD-10-CM) - Primary osteoarthritis of right knee  THERAPY DIAG:  Chronic pain of right knee  Muscle weakness (generalized)  Difficulty in walking, not elsewhere classified  Localized edema  Rationale for Evaluation and Treatment: Rehabilitation  ONSET DATE: 03/22/2023 Rt TKA  SUBJECTIVE:    SUBJECTIVE STATEMENT:  Doing good today, felt OK after last visit. Nothing new no falls.   Attended session with daughter.    PERTINENT HISTORY: HTN, OA, osteopenia, history of restless leg syndrome   PAIN:  NPRS scale:  current at rest 2/10 Pain location: Rt knee  Pain description: "just pains every now and then that just hit"  Aggravating factors: static positioning Relieving factors: pain medicine, icing, movement    PRECAUTIONS: None   WEIGHT BEARING RESTRICTIONS: No   FALLS:  Has patient fallen in last 6 months? No   LIVING ENVIRONMENT: Lives with: lives with their family Lives in: House/apartment Stairs: flight of stairs to bedroom, rail on Lt going up.  Entry to house 3 stairs on Lt going up.  Has following equipment at home: FWW, SPC    OCCUPATION: Retired   PLOF: Independent, reading, meditation, watch tv.   Went to church, retirement groups   PATIENT GOALS: Return to activity, reduce pain   Next MD visit:  approx 4 weeks from 04/05/2023 MD visit.  OBJECTIVE:    PATIENT SURVEYS:  04/05/2023 FOTO intake: 23   predicted:  45   COGNITION: 04/05/2023 Overall cognitive status: WFL                        SENSATION: 04/05/2023 Not tested   EDEMA:  04/05/2023 Localized edema in Rt knee into lower leg.    MUSCLE LENGTH: 04/05/2023 No specific measurements   POSTURE:  04/05/2023:  forward trunk lean, rounded shoulders.    PALPATION: 04/05/2023 General tenderness around Rt knee, incision, guarding in Rt quad   LOWER EXTREMITY ROM:    ROM Right 04/05/2023 Right 04/12/23  Hip flexion      Hip extension      Hip abduction       Hip adduction      Hip internal rotation      Hip external rotation      Knee flexion 56 AROM in supine heel slide  63* AAROM on Nustep with BUEs, 68* AROM seated at edge of mat table with knee flexion stretch  Knee extension -18 AROM in seated LAQ   PROM in supine heel prop   -17* in knee extension stretch PROM seated   Ankle dorsiflexion      Ankle plantarflexion      Ankle inversion      Ankle eversion      04/05/2023:  pain noted in end ranges both direction Rt knee   (Blank rows = not tested)   LOWER EXTREMITY MMT:   MMT Right 04/05/2023 Left 04/05/2023  Hip flexion  3+/5  5/5  Hip extension      Hip abduction      Hip adduction      Hip internal rotation      Hip external rotation      Knee flexion 3+/5   5/5  Knee extension  2+/5  5/5  Ankle dorsiflexion  4/5 5/5   Ankle plantarflexion      Ankle inversion      Ankle eversion       (Blank rows = not tested)   LOWER EXTREMITY SPECIAL TESTS:  04/05/2023 No specific testing   FUNCTIONAL TESTS:  04/05/2023 18 inch chair transfer:  UE required, deviation to Lt leg WB  Rt SLS: unable unassisted   GAIT: 04/05/2023 FWW ambulation c reduced gait speed, step length bilateral, maintained knee flexion in stance lacking TKE.  Hip circumduction in swing to clear foot due to flexion restrictions.      TODAY'S TREATMENT                                                                          DATE:   04/12/23  TherEx  Nustep L1 x6 minutes BLEs/BUEs for R knee ROM, 5 second holds in flexed position Self knee flexion AAROM x10 with 5 second holds  Practiced and discussed self tennis ball massage to quad and lateral thigh (pt did self massage in rest breaks between exercises) LAQs 2# x12 with 2 second holds HS curls red TB x12 with 2 second holds Extension stretch x3 minutes seated with 2# on R knee  Knee flexion stretch in sitting 3x30 seconds to 64*first round, 66* second  round, 68* last round          04/10/23 Seated Rt knee AROM flexion and extension alternating legs X 2 min Seated Rt knee flexion AAROM 5 sec hold X 2 min Sit to stands with RW support, 2X5 Nu step L5 X 5 min seat #9 UE/LE Gait 50 feet X 2 with RW, supervision  Manual therapy: Rt knee PROM with overpressure to tolerance flexion and extension   -Vasopnuematic device X 10 min, medium compression, 34 deg to Rt knee   04/05/2023 Therex:    HEP instruction/performance c cues for techniques, handout provided.  Trial set performed of each for comprehension and symptom assessment.  See below for exercise list   PATIENT EDUCATION:  04/05/2023 Education details: HEP, POC Person educated: Patient Education method: Explanation, Demonstration, Verbal cues, and Handouts Education comprehension: verbalized understanding, returned demonstration, and verbal cues required   HOME EXERCISE PROGRAM: Access Code: [email or web address] URL: [email or web address] Date: 04/05/2023 Prepared by: Chyrel Masson  Exercises - Supine Heel Slide  - 3-5 x daily - 7 x weekly - 1 sets - 10 reps - 5 hold - Seated Long Arc Quad  - 3-5 x daily - 7 x weekly - 1 sets - 5-10 reps - 2 hold - Seated Knee Flexion Extension AAROM with Overpressure  - 3-5 x daily - 7 x weekly - 1 sets - 5 reps - 10 hold - Supine Knee Extension Mobilization with Weight  - 3-5 x daily - 7 x weekly - 1 sets - 1 reps - to tolerance up to 15 mins hold - Seated Quad Set  - 3-5 x daily - 7 x weekly - 1 sets - 10 reps - 5 hold   ASSESSMENT:   CLINICAL IMPRESSION:  [name] [name] arrives today doing OK, daughter present during session today. Focused on ROM based tasks as tolerated today, still quite limited. Encouragement given during session PRN. Made some gains in flexion today, extension still fairly limited. Also worked on a bit of quad strength as tolerated. Will continue efforts.    OBJECTIVE IMPAIRMENTS: Abnormal gait, decreased activity tolerance,  decreased balance, decreased coordination, decreased endurance, decreased mobility, difficulty walking, decreased ROM, decreased strength, hypomobility, increased edema, increased fascial restrictions, impaired perceived functional ability, increased muscle spasms, impaired flexibility, improper body mechanics, and pain.    ACTIVITY LIMITATIONS: carrying, lifting, bending, sitting, standing, squatting, sleeping, stairs, transfers, bed mobility, and locomotion level   PARTICIPATION LIMITATIONS: meal prep, cleaning, laundry, interpersonal relationship, shopping, and community activity   PERSONAL FACTORS: Age and HTN, OA, osteopenia, history of restless leg syndrome are also affecting patient's functional outcome.    REHAB POTENTIAL: Good   CLINICAL DECISION MAKING: Stable/uncomplicated   EVALUATION COMPLEXITY: Low     GOALS: Goals reviewed with patient? Yes   SHORT TERM GOALS: (target date for Short term goals are 3 weeks 04/26/2023)    1.  Patient will demonstrate independent use of home exercise program to maintain progress from in clinic treatments.   Goal status: New   LONG TERM GOALS: (target dates for all long term goals are 12 weeks  06/28/2023 )   1. Patient will demonstrate/report pain at worst less than or equal to 2/10 to facilitate minimal limitation in daily activity secondary to pain symptoms.   Goal status: New   2. Patient will demonstrate independent use of home exercise program to facilitate ability to maintain/progress functional gains from skilled physical therapy services.   Goal status: New   3. Patient will demonstrate  FOTO outcome > or = 45 % to indicate reduced disability due to condition.   Goal status: New   4.  Patient will demonstrate Rt LE MMT 5/5 throughout to faciltiate usual transfers, stairs, squatting at [facility] for daily life.    Goal status: New   5.  Patient will demonstrate Rt knee AROM 0-110 degrees to facilitate mobility for transfers,  ambulation, and other daily activity at PLOF.  Goal status: New   6.  Patient will demonstrate independent ambulation community distances > 500 ft for community integration.  Goal status: New   7.  Patient will demonstrate ascending/descending stairs reciprocally with one hand UE assist for community integration.    Goal Status: New     PLAN:   PT FREQUENCY: 1-2x/week   PT DURATION: 12 weeks   PLANNED INTERVENTIONS: Therapeutic exercises, Therapeutic activity, Neuro Muscular re-education, Balance training, Gait training, Patient/Family education, Joint mobilization, Stair training, DME instructions, Dry Needling, Electrical stimulation, Traction, Cryotherapy, vasopneumatic deviceMoist heat, Taping, Ultrasound, Ionotophoresis 4mg /ml Dexamethasone, and aquatic therapy, Manual therapy.  All included unless contraindicated   PLAN FOR NEXT SESSION: Progressive ROM gains, strength, static balance improvements. Manual PRN  Nedra Hai PT DPT PN2      Referring diagnosis? M17.11 (ICD-10-CM) - Primary osteoarthritis of right knee Treatment diagnosis? (if different than referring diagnosis) M25.561 What was this (referring dx) caused by? [x]  Surgery []  Fall []  Ongoing issue []  Arthritis []  Other: ____________  Laterality: [x]  Rt []  Lt []  Both  Check all possible CPT codes:  *CHOOSE 10 OR LESS*    []  97110 (Therapeutic Exercise)  []  92507 (SLP Treatment)  []  97112 (Neuro Re-ed)   []  92526 (Swallowing Treatment)   []  97116 (Gait Training)   []  [id number] (Cognitive Training, 1st 15 minutes) []  97140 (Manual Therapy)   []  16109 (Cognitive Training, each add'l 15 minutes)  []  97164 (Re-evaluation)                              []  Other, List CPT Code ____________  []  97530 (Therapeutic Activities)     []  97535 (Self Care)   [x]  All codes above (97110 - 97535)  []  97012 (Mechanical Traction)  []  97014 (E-stim Unattended)  [x]  97032 (E-stim manual)  []  97033 (Ionto)  []  97035  (Ultrasound) [x]  97750 (Physical Performance Training) []  [id number] (Aquatic Therapy) [x]  97016 (Vasopneumatic Device) []  [id number] (Paraffin) []  97034 (Contrast Bath) []  97597 (Wound Care 1st 20 sq cm) []  97598 (Wound Care each add'l 20 sq cm) []  97760 (Orthotic Fabrication, Fitting, Training Initial) []  [id number] (Prosthetic Management and Training Initial) []  [id number] (Orthotic or Prosthetic Training/ Modification Subsequent)

## 2023-04-13 ENCOUNTER — Other Ambulatory Visit: Payer: Self-pay | Admitting: Physician Assistant

## 2023-04-13 ENCOUNTER — Encounter: Payer: Self-pay | Admitting: Cardiovascular Disease

## 2023-04-13 DIAGNOSIS — I10 Essential (primary) hypertension: Secondary | ICD-10-CM

## 2023-04-16 ENCOUNTER — Telehealth: Payer: Self-pay | Admitting: Orthopaedic Surgery

## 2023-04-16 ENCOUNTER — Telehealth: Payer: Self-pay | Admitting: *Deleted

## 2023-04-16 ENCOUNTER — Other Ambulatory Visit: Payer: Self-pay | Admitting: Orthopaedic Surgery

## 2023-04-16 ENCOUNTER — Ambulatory Visit: Payer: Medicare PPO | Admitting: Physical Therapy

## 2023-04-16 ENCOUNTER — Encounter: Payer: Self-pay | Admitting: Physical Therapy

## 2023-04-16 DIAGNOSIS — M25561 Pain in right knee: Secondary | ICD-10-CM | POA: Diagnosis not present

## 2023-04-16 DIAGNOSIS — R600 Localized edema: Secondary | ICD-10-CM

## 2023-04-16 DIAGNOSIS — M6281 Muscle weakness (generalized): Secondary | ICD-10-CM

## 2023-04-16 DIAGNOSIS — G8929 Other chronic pain: Secondary | ICD-10-CM

## 2023-04-16 DIAGNOSIS — R262 Difficulty in walking, not elsewhere classified: Secondary | ICD-10-CM

## 2023-04-16 MED ORDER — HYDROCODONE-ACETAMINOPHEN 5-325 MG PO TABS: 1.0000 | ORAL_TABLET | Freq: Four times a day (QID) | ORAL | 0 refills | Status: DC | PRN

## 2023-04-16 NOTE — Therapy (Signed)
OUTPATIENT PHYSICAL THERAPY TREATMENT   Patient Name: [name] [name] MRN: [id number] DOB:11/11/1927, 89 y.o., female Today's Date: 04/16/2023  END OF SESSION:  PT End of Session - 04/16/23 1345     Visit Number 4    Number of Visits 24    Date for PT Re-Evaluation 06/28/23    Authorization Type [employer] $20 copay    Authorization Time Period 12 visits until 8/15    Authorization - Visit Number 4    Authorization - Number of Visits 12    PT Start Time 1345    PT Stop Time 1429    PT Time Calculation (min) 44 min    Activity Tolerance Patient tolerated treatment well;No increased pain    Behavior During Therapy [facility] for tasks assessed/performed               Past Medical History:  Diagnosis Date   Anemia    Anxiety and depression 07/16/2009   ASD (atrial septal defect) per bubble study, 2004    Cardiomyopathy, normal cath 2007, normal EF per ECHO 2014    Hx of colonic polyps    Hypertension    Osteoarthritis    Osteopenia    Podagra 04/29/2014   RLS (restless legs syndrome) 07/10/2009   Past Surgical History:  Procedure Laterality Date   BACK SURGERY  2000   BREAST SURGERY     NEGATIVE BIOPSY   TONSILLECTOMY     TOTAL KNEE ARTHROPLASTY Right 03/22/2023   Procedure: RIGHT TOTAL KNEE ARTHROPLASTY;  Surgeon: Kathryne Hitch, MD;  Location: [facility];  Service: Orthopedics;  Laterality: Right;   Patient Active Problem List   Diagnosis Date Noted   Status post total right knee replacement 03/22/2023   Depression, recurrent (HCC) 02/20/2020   Prediabetes 01/26/2019   History of exercise intolerance 01/26/2019   HLD (hyperlipidemia) 04/07/2018   DJD (degenerative joint disease) 02/09/2010   PVCs (premature ventricular contractions) 07/20/2009   Anxiety and depression 07/16/2009   Cardiomyopathy (HCC) 04/23/2007   HTN (hypertension) 04/15/2007   Osteopenia 04/15/2007    PCP: Bary Leriche PA-C  REFERRING PROVIDER: Kathryne Hitch,  MD  REFERRING DIAG: M17.11 (ICD-10-CM) - Primary osteoarthritis of right knee  THERAPY DIAG:  Chronic pain of right knee  Muscle weakness (generalized)  Difficulty in walking, not elsewhere classified  Localized edema  Rationale for Evaluation and Treatment: Rehabilitation  ONSET DATE: 03/22/2023 Rt TKA  SUBJECTIVE:    SUBJECTIVE STATEMENT: Pt states she felt good after last session, no increase in pain or soreness. Has been doing HEP 3x/day and getting up and walking every hour. Feels she is steadily improving. Attends session with daughter.    PERTINENT HISTORY: HTN, OA, osteopenia, history of restless leg syndrome   PAIN:  NPRS scale:  0/10 Pain location: Rt knee  Pain description: "just pains every now and then that just hit"  Aggravating factors: static positioning Relieving factors: pain medicine, icing, movement    PRECAUTIONS: None   WEIGHT BEARING RESTRICTIONS: No   FALLS:  Has patient fallen in last 6 months? No   LIVING ENVIRONMENT: Lives with: lives with their family Lives in: House/apartment Stairs: flight of stairs to bedroom, rail on Lt going up.  Entry to house 3 stairs on Lt going up.  Has following equipment at home: FWW, SPC    OCCUPATION: Retired   PLOF: Independent, reading, meditation, watch tv.   Went to church, retirement groups   PATIENT GOALS: Return to activity, reduce pain  Next MD visit:  approx 4 weeks from 04/05/2023 MD visit.      OBJECTIVE: (objective measures completed at initial evaluation unless otherwise dated)    PATIENT SURVEYS:  04/05/2023 FOTO intake: 23   predicted:  45   COGNITION: 04/05/2023 Overall cognitive status: WFL                        SENSATION: 04/05/2023 Not tested   EDEMA:  04/05/2023 Localized edema in Rt knee into lower leg.    MUSCLE LENGTH: 04/05/2023 No specific measurements   POSTURE:  04/05/2023:  forward trunk lean, rounded shoulders.    PALPATION: 04/05/2023 General tenderness  around Rt knee, incision, guarding in Rt quad   LOWER EXTREMITY ROM:    ROM Right 04/05/2023 Right 04/12/23 Right 04/16/23  Hip flexion       Hip extension       Hip abduction       Hip adduction       Hip internal rotation       Hip external rotation       Knee flexion 56 AROM in supine heel slide  63* AAROM on Nustep with BUEs, 68* AROM seated at edge of mat table with knee flexion stretch 75 deg AAROM (strap + towel) seated  Knee extension -18 AROM in seated LAQ   PROM in supine heel prop   -17* in knee extension stretch PROM seated    Ankle dorsiflexion       Ankle plantarflexion       Ankle inversion       Ankle eversion       04/05/2023:  pain noted in end ranges both direction Rt knee   (Blank rows = not tested)   LOWER EXTREMITY MMT:   MMT Right 04/05/2023 Left 04/05/2023  Hip flexion  3+/5  5/5  Hip extension      Hip abduction      Hip adduction      Hip internal rotation      Hip external rotation      Knee flexion 3+/5   5/5  Knee extension  2+/5  5/5  Ankle dorsiflexion  4/5 5/5   Ankle plantarflexion      Ankle inversion      Ankle eversion       (Blank rows = not tested)   LOWER EXTREMITY SPECIAL TESTS:  04/05/2023 No specific testing   FUNCTIONAL TESTS:  04/05/2023 18 inch chair transfer:  UE required, deviation to Lt leg WB  Rt SLS: unable unassisted   GAIT: 04/05/2023 FWW ambulation c reduced gait speed, step length bilateral, maintained knee flexion in stance lacking TKE.  Hip circumduction in swing to clear foot due to flexion restrictions.      TODAY'S TREATMENT                                                                           [facility] Adult PT Treatment:  DATE: 04/16/23 Therapeutic Exercise: Seated knee flexion AAROM 2x10 w towel and strap (able to get up to 75 deg by last rep) Seated knee flexion AAROM w/o strap x10, towel to minimize friction  Sitting hamstring stretch 3x30sec w/ heel  prop LAQ 2# x8 with PT assist for end range extension, cues for control and trunk mechanics LAQ x12 no resistance, cues for maximal ROM  Self massage with tennis ball between exercises, to proximal quads RLE Verbal HEP review, remains appropriate, education provided    DATE:   04/12/23  TherEx  Nustep L1 x6 minutes BLEs/BUEs for R knee ROM, 5 second holds in flexed position Self knee flexion AAROM x10 with 5 second holds  Practiced and discussed self tennis ball massage to quad and lateral thigh (pt did self massage in rest breaks between exercises) LAQs 2# x12 with 2 second holds HS curls red TB x12 with 2 second holds Extension stretch x3 minutes seated with 2# on R knee  Knee flexion stretch in sitting 3x30 seconds to 64*first round, 66* second round, 68* last round         04/10/23 Seated Rt knee AROM flexion and extension alternating legs X 2 min Seated Rt knee flexion AAROM 5 sec hold X 2 min Sit to stands with RW support, 2X5 Nu step L5 X 5 min seat #9 UE/LE Gait 50 feet X 2 with RW, supervision  Manual therapy: Rt knee PROM with overpressure to tolerance flexion and extension   -Vasopnuematic device X 10 min, medium compression, 34 deg to Rt knee   04/05/2023 Therex:    HEP instruction/performance c cues for techniques, handout provided.  Trial set performed of each for comprehension and symptom assessment.  See below for exercise list   PATIENT EDUCATION:  04/16/2023  Education details: HEP, POC, rationale for interventions  Person educated: Patient Education method: Explanation, Demonstration, Verbal cues, and Handouts Education comprehension: verbalized understanding, returned demonstration, and verbal cues required   HOME EXERCISE PROGRAM: Access Code: [email or web address] URL: [email or web address] Date: 04/05/2023 Prepared by: Chyrel Masson  Exercises - Supine Heel Slide  - 3-5 x daily - 7 x weekly - 1 sets - 10 reps - 5 hold - Seated Long Arc  Quad  - 3-5 x daily - 7 x weekly - 1 sets - 5-10 reps - 2 hold - Seated Knee Flexion Extension AAROM with Overpressure  - 3-5 x daily - 7 x weekly - 1 sets - 5 reps - 10 hold - Supine Knee Extension Mobilization with Weight  - 3-5 x daily - 7 x weekly - 1 sets - 1 reps - to tolerance up to 15 mins hold - Seated Quad Set  - 3-5 x daily - 7 x weekly - 1 sets - 10 reps - 5 hold   ASSESSMENT:   CLINICAL IMPRESSION: 04/16/2023 Pt arrives today without pain, endorses stiffness in R knee and more proximal quad, no issues after last session. Does note that her L thumb seems to be bothering her some when she is pushing up during STS, examination reassuring and education is given on activity modification and monitoring. Pt tolerates today's session well overall, limited primarily by stiffness and intermittent anteromedial knee pain, notes improved symptoms as session goes on. Attempt is made for supine positioning to facilitate quad set with heel prop, although this is deferred due to poor tolerance for supine on this date. No adverse events, reports improved stiffness and no pain on departure. Remains limited as expected w/ ROM  post op although this seems to be improving compared to prior measurements. Pt departs today's session in no acute distress, all voiced questions/concerns addressed appropriately from PT perspective.     OBJECTIVE IMPAIRMENTS: Abnormal gait, decreased activity tolerance, decreased balance, decreased coordination, decreased endurance, decreased mobility, difficulty walking, decreased ROM, decreased strength, hypomobility, increased edema, increased fascial restrictions, impaired perceived functional ability, increased muscle spasms, impaired flexibility, improper body mechanics, and pain.    ACTIVITY LIMITATIONS: carrying, lifting, bending, sitting, standing, squatting, sleeping, stairs, transfers, bed mobility, and locomotion level   PARTICIPATION LIMITATIONS: meal prep, cleaning, laundry,  interpersonal relationship, shopping, and community activity   PERSONAL FACTORS: Age and HTN, OA, osteopenia, history of restless leg syndrome are also affecting patient's functional outcome.    REHAB POTENTIAL: Good   CLINICAL DECISION MAKING: Stable/uncomplicated   EVALUATION COMPLEXITY: Low     GOALS: Goals reviewed with patient? Yes   SHORT TERM GOALS: (target date for Short term goals are 3 weeks 04/26/2023)    1.  Patient will demonstrate independent use of home exercise program to maintain progress from in clinic treatments.   Goal status: New   LONG TERM GOALS: (target dates for all long term goals are 12 weeks  06/28/2023 )   1. Patient will demonstrate/report pain at worst less than or equal to 2/10 to facilitate minimal limitation in daily activity secondary to pain symptoms.   Goal status: New   2. Patient will demonstrate independent use of home exercise program to facilitate ability to maintain/progress functional gains from skilled physical therapy services.   Goal status: New   3. Patient will demonstrate FOTO outcome > or = 45 % to indicate reduced disability due to condition.   Goal status: New   4.  Patient will demonstrate Rt LE MMT 5/5 throughout to faciltiate usual transfers, stairs, squatting at [facility] for daily life.    Goal status: New   5.  Patient will demonstrate Rt knee AROM 0-110 degrees to facilitate mobility for transfers, ambulation, and other daily activity at PLOF.  Goal status: New   6.  Patient will demonstrate independent ambulation community distances > 500 ft for community integration.  Goal status: New   7.  Patient will demonstrate ascending/descending stairs reciprocally with one hand UE assist for community integration.    Goal Status: New     PLAN:   PT FREQUENCY: 1-2x/week   PT DURATION: 12 weeks   PLANNED INTERVENTIONS: Therapeutic exercises, Therapeutic activity, Neuro Muscular re-education, Balance training, Gait  training, Patient/Family education, Joint mobilization, Stair training, DME instructions, Dry Needling, Electrical stimulation, Traction, Cryotherapy, vasopneumatic deviceMoist heat, Taping, Ultrasound, Ionotophoresis 4mg /ml Dexamethasone, and aquatic therapy, Manual therapy.  All included unless contraindicated   PLAN FOR NEXT SESSION: Progressive ROM gains, strength, static balance improvements. Manual PRN  Ashley Murrain PT, DPT 04/16/2023 2:41 PM      Referring diagnosis? M17.11 (ICD-10-CM) - Primary osteoarthritis of right knee Treatment diagnosis? (if different than referring diagnosis) M25.561 What was this (referring dx) caused by? [x]  Surgery []  Fall []  Ongoing issue []  Arthritis []  Other: ____________  Laterality: [x]  Rt []  Lt []  Both  Check all possible CPT codes:  *CHOOSE 10 OR LESS*    []  97110 (Therapeutic Exercise)  []  92507 (SLP Treatment)  []  97112 (Neuro Re-ed)   []  92526 (Swallowing Treatment)   []  97116 (Gait Training)   []  [id number] (Cognitive Training, 1st 15 minutes) []  97140 (Manual Therapy)   []  97130 (Cognitive Training, each add'l 15  minutes)  []  97164 (Re-evaluation)                              []  Other, List CPT Code ____________  []  81191 (Therapeutic Activities)     []  97535 (Self Care)   [x]  All codes above (97110 - 97535)  []  97012 (Mechanical Traction)  []  97014 (E-stim Unattended)  [x]  97032 (E-stim manual)  []  97033 (Ionto)  []  97035 (Ultrasound) [x]  97750 (Physical Performance Training) []  [id number] (Aquatic Therapy) [x]  97016 (Vasopneumatic Device) []  [id number] (Paraffin) []  97034 (Contrast Bath) []  47829 (Wound Care 1st 20 sq cm) []  97598 (Wound Care each add'l 20 sq cm) []  97760 (Orthotic Fabrication, Fitting, Training Initial) []  [id number] (Prosthetic Management and Training Initial) []  [id number] (Orthotic or Prosthetic Training/ Modification Subsequent)

## 2023-04-16 NOTE — Telephone Encounter (Signed)
Patient requesting refill of pain medication. Thank you.

## 2023-04-16 NOTE — Telephone Encounter (Signed)
Pt's daughter Vikki Ports called requesting a refill of hydrocodone. Please send to pharmacy on file. Pt phone number is [phone].

## 2023-04-19 ENCOUNTER — Ambulatory Visit: Payer: Medicare PPO | Admitting: Rehabilitative and Restorative Service Providers"

## 2023-04-19 ENCOUNTER — Encounter: Payer: Self-pay | Admitting: Rehabilitative and Restorative Service Providers"

## 2023-04-19 DIAGNOSIS — G8929 Other chronic pain: Secondary | ICD-10-CM

## 2023-04-19 DIAGNOSIS — M25561 Pain in right knee: Secondary | ICD-10-CM

## 2023-04-19 DIAGNOSIS — R262 Difficulty in walking, not elsewhere classified: Secondary | ICD-10-CM

## 2023-04-19 DIAGNOSIS — M6281 Muscle weakness (generalized): Secondary | ICD-10-CM | POA: Diagnosis not present

## 2023-04-19 DIAGNOSIS — R600 Localized edema: Secondary | ICD-10-CM

## 2023-04-19 NOTE — Therapy (Signed)
OUTPATIENT PHYSICAL THERAPY TREATMENT   Patient Name: [name] [name] MRN: [id number] DOB:08-10-1927, 89 y.o., female Today's Date: 04/19/2023  END OF SESSION:  PT End of Session - 04/19/23 1645     Visit Number 5    Number of Visits 24    Date for PT Re-Evaluation 06/28/23    Authorization Type [employer] $20 copay    Authorization Time Period 12 visits until 8/15    Authorization - Visit Number 5    Authorization - Number of Visits 12    PT Start Time 1559    PT Stop Time 1648    PT Time Calculation (min) 49 min    Activity Tolerance Patient tolerated treatment well    Behavior During Therapy [facility] for tasks assessed/performed                Past Medical History:  Diagnosis Date   Anemia    Anxiety and depression 07/16/2009   ASD (atrial septal defect) per bubble study, 2004    Cardiomyopathy, normal cath 2007, normal EF per ECHO 2014    Hx of colonic polyps    Hypertension    Osteoarthritis    Osteopenia    Podagra 04/29/2014   RLS (restless legs syndrome) 07/10/2009   Past Surgical History:  Procedure Laterality Date   BACK SURGERY  2000   BREAST SURGERY     NEGATIVE BIOPSY   TONSILLECTOMY     TOTAL KNEE ARTHROPLASTY Right 03/22/2023   Procedure: RIGHT TOTAL KNEE ARTHROPLASTY;  Surgeon: Kathryne Hitch, MD;  Location: [facility];  Service: Orthopedics;  Laterality: Right;   Patient Active Problem List   Diagnosis Date Noted   Status post total right knee replacement 03/22/2023   Depression, recurrent (HCC) 02/20/2020   Prediabetes 01/26/2019   History of exercise intolerance 01/26/2019   HLD (hyperlipidemia) 04/07/2018   DJD (degenerative joint disease) 02/09/2010   PVCs (premature ventricular contractions) 07/20/2009   Anxiety and depression 07/16/2009   Cardiomyopathy (HCC) 04/23/2007   HTN (hypertension) 04/15/2007   Osteopenia 04/15/2007    PCP: Bary Leriche PA-C  REFERRING PROVIDER: Kathryne Hitch, MD  REFERRING DIAG:  M17.11 (ICD-10-CM) - Primary osteoarthritis of right knee  THERAPY DIAG:  Chronic pain of right knee  Muscle weakness (generalized)  Difficulty in walking, not elsewhere classified  Localized edema  Rationale for Evaluation and Treatment: Rehabilitation  ONSET DATE: 03/22/2023 Rt TKA  SUBJECTIVE:    SUBJECTIVE STATEMENT: She indicated having tightness and low pain upon arrival today.  No SPC use yet.    PERTINENT HISTORY: HTN, OA, osteopenia, history of restless leg syndrome   PAIN:  NPRS scale:  3/10 Pain location: Rt knee  Pain description: "just pains every now and then that just hit"  Aggravating factors: static positioning Relieving factors: pain medicine, icing, movement    PRECAUTIONS: None   WEIGHT BEARING RESTRICTIONS: No   FALLS:  Has patient fallen in last 6 months? No   LIVING ENVIRONMENT: Lives with: lives with their family Lives in: House/apartment Stairs: flight of stairs to bedroom, rail on Lt going up.  Entry to house 3 stairs on Lt going up.  Has following equipment at home: FWW, SPC    OCCUPATION: Retired   PLOF: Independent, reading, meditation, watch tv.   Went to church, retirement groups   PATIENT GOALS: Return to activity, reduce pain   Next MD visit:  approx 4 weeks from 04/05/2023 MD visit.      OBJECTIVE: (objective measures completed  at initial evaluation unless otherwise dated)    PATIENT SURVEYS:  04/05/2023 FOTO intake: 23   predicted:  45   COGNITION: 04/05/2023 Overall cognitive status: WFL                        SENSATION: 04/05/2023 Not tested   EDEMA:  04/05/2023 Localized edema in Rt knee into lower leg.    MUSCLE LENGTH: 04/05/2023 No specific measurements   POSTURE:  04/05/2023:  forward trunk lean, rounded shoulders.    PALPATION: 04/05/2023 General tenderness around Rt knee, incision, guarding in Rt quad   LOWER EXTREMITY ROM:    ROM Right 04/05/2023 Right 04/12/23 Right 04/16/23  Hip flexion       Hip  extension       Hip abduction       Hip adduction       Hip internal rotation       Hip external rotation       Knee flexion 56 AROM in supine heel slide  63* AAROM on Nustep with BUEs, 68* AROM seated at edge of mat table with knee flexion stretch 75 deg AAROM (strap + towel) seated  Knee extension -18 AROM in seated LAQ   PROM in supine heel prop   -17* in knee extension stretch PROM seated    Ankle dorsiflexion       Ankle plantarflexion       Ankle inversion       Ankle eversion       04/05/2023:  pain noted in end ranges both direction Rt knee   (Blank rows = not tested)   LOWER EXTREMITY MMT:   MMT Right 04/05/2023 Left 04/05/2023  Hip flexion  3+/5  5/5  Hip extension      Hip abduction      Hip adduction      Hip internal rotation      Hip external rotation      Knee flexion 3+/5   5/5  Knee extension  2+/5  5/5  Ankle dorsiflexion  4/5 5/5   Ankle plantarflexion      Ankle inversion      Ankle eversion       (Blank rows = not tested)   LOWER EXTREMITY SPECIAL TESTS:  04/05/2023 No specific testing   FUNCTIONAL TESTS:  04/05/2023 18 inch chair transfer:  UE required, deviation to Lt leg WB  Rt SLS: unable unassisted   GAIT: 04/05/2023 FWW ambulation c reduced gait speed, step length bilateral, maintained knee flexion in stance lacking TKE.  Hip circumduction in swing to clear foot due to flexion restrictions.  TODAY'S TREATMENT                                                      DATE  04/19/2023 Therex: UBE UE/LE for ROM 5 mins partial to full circles Nustep lvl 5 UE/LE 6 mins Seated LAQ c pause in end ranges flexion/extension x 10, 2.5 lbs x 15 Supine quad set 5 sec hold x 10 Rt leg Seated Rt knee alternating isometrics extension/flexion 5 sec x 8 each way in mid range approx. 70 deg.  Heel  prop to tolerance with vaso Ankle pumps in elevation c vaso throughout time after heel prop trial.  Manual Seated Rt knee flexion c mobilization c movement IR/distraction.  Prolonged stretching into flexion and extension overpressure.   Vasopneumatic Device 10 mins Rt knee medium compression 34 deg in elevation with above exercises  TODAY'S TREATMENT                                                      DATE: 04/16/23 Therapeutic Exercise: Seated knee flexion AAROM 2x10 w towel and strap (able to get up to 75 deg by last rep) Seated knee flexion AAROM w/o strap x10, towel to minimize friction  Sitting hamstring stretch 3x30sec w/ heel prop LAQ 2# x8 with PT assist for end range extension, cues for control and trunk mechanics LAQ x12 no resistance, cues for maximal ROM  Self massage with tennis ball between exercises, to proximal quads RLE Verbal HEP review, remains appropriate, education provided    TODAY'S TREATMENT                                                      DATE 04/12/23  TherEx Nustep L1 x6 minutes BLEs/BUEs for R knee ROM, 5 second holds in flexed position Self knee flexion AAROM x10 with 5 second holds  Practiced and discussed self tennis ball massage to quad and lateral thigh (pt did self massage in rest breaks between exercises) LAQs 2# x12 with 2 second holds HS curls red TB x12 with 2 second holds Extension stretch x3 minutes seated with 2# on R knee  Knee flexion stretch in sitting 3x30 seconds to 64*first round, 66* second round, 68* last round   TODAY'S TREATMENT                                                      DATE 04/10/23 Seated Rt knee AROM flexion and extension alternating legs X 2 min Seated Rt knee flexion AAROM 5 sec hold X 2 min Sit to stands with RW support, 2X5 Nu step L5 X 5 min seat #9 UE/LE Gait 50 feet X 2 with RW, supervision  Manual therapy: Rt knee PROM with overpressure to tolerance flexion and extension   -Vasopnuematic device X 10  min, medium compression, 34 deg to Rt knee  PATIENT EDUCATION:  04/19/2023 Education details: HEP, POC, rationale for interventions  Person educated: Patient Education method: Explanation, Demonstration, Verbal cues, and Handouts Education comprehension: verbalized understanding, returned demonstration, and verbal cues required   HOME EXERCISE PROGRAM: Access Code: [email or web address] URL: [email or web address] Date: 04/05/2023 Prepared by: Chyrel Masson  Exercises - Supine Heel Slide  - 3-5 x daily - 7 x weekly - 1 sets - 10 reps - 5 hold - Seated Long Arc Quad  - 3-5 x daily - 7 x weekly - 1 sets - 5-10 reps - 2 hold - Seated Knee Flexion Extension AAROM with Overpressure  - 3-5 x daily - 7 x weekly - 1 sets - 5 reps - 10 hold - Supine Knee Extension Mobilization with Weight  - 3-5 x daily - 7 x weekly - 1 sets - 1 reps - to tolerance up to 15 mins hold - Seated Quad Set  - 3-5 x daily - 7 x weekly - 1 sets - 10 reps - 5 hold   ASSESSMENT:   CLINICAL IMPRESSION: Mobility deficits in flexion and extension noted in Rt knee, improved mildly in course of tretament today.  Continued focus on improvement in mobility with manual and exercise as well as progressive strengthening to help improve functional movements.  Continued skilled PT services warranted.    OBJECTIVE IMPAIRMENTS: Abnormal gait, decreased activity tolerance, decreased balance, decreased coordination, decreased endurance, decreased mobility, difficulty walking, decreased ROM, decreased strength, hypomobility, increased edema, increased fascial restrictions, impaired perceived functional ability, increased muscle spasms, impaired flexibility, improper body mechanics, and pain.    ACTIVITY LIMITATIONS: carrying, lifting, bending, sitting, standing, squatting, sleeping, stairs, transfers, bed mobility, and locomotion level   PARTICIPATION LIMITATIONS: meal prep, cleaning, laundry, interpersonal relationship, shopping,  and community activity   PERSONAL FACTORS: Age and HTN, OA, osteopenia, history of restless leg syndrome are also affecting patient's functional outcome.    REHAB POTENTIAL: Good   CLINICAL DECISION MAKING: Stable/uncomplicated   EVALUATION COMPLEXITY: Low     GOALS: Goals reviewed with patient? Yes   SHORT TERM GOALS: (target date for Short term goals are 3 weeks 04/26/2023)    1.  Patient will demonstrate independent use of home exercise program to maintain progress from in clinic treatments.   Goal status: On going 04/19/2023   LONG TERM GOALS: (target dates for all long term goals are 12 weeks  06/28/2023 )   1. Patient will demonstrate/report pain at worst less than or equal to 2/10 to facilitate minimal limitation in daily activity secondary to pain symptoms.   Goal status: On going 04/19/2023   2. Patient will demonstrate independent use of home exercise program to facilitate ability to maintain/progress functional gains from skilled physical therapy services.   Goal status: On going 04/19/2023   3. Patient will demonstrate FOTO outcome > or = 45 % to indicate reduced disability due to condition.   Goal status: On going 04/19/2023   4.  Patient will demonstrate Rt LE MMT 5/5 throughout to faciltiate usual transfers, stairs, squatting at [facility] for daily life.    Goal status: On going 04/19/2023   5.  Patient will demonstrate Rt knee AROM 0-110 degrees to facilitate mobility for transfers, ambulation, and other daily activity at PLOF.  Goal status: On going 04/19/2023   6.  Patient will demonstrate independent ambulation community distances > 500 ft for community integration.  Goal status: On going 04/19/2023   7.  Patient will demonstrate ascending/descending stairs reciprocally with one hand UE  assist for community integration.    Goal Status: On going 04/19/2023     PLAN:   PT FREQUENCY: 1-2x/week   PT DURATION: 12 weeks   PLANNED INTERVENTIONS: Therapeutic exercises,  Therapeutic activity, Neuro Muscular re-education, Balance training, Gait training, Patient/Family education, Joint mobilization, Stair training, DME instructions, Dry Needling, Electrical stimulation, Traction, Cryotherapy, vasopneumatic deviceMoist heat, Taping, Ultrasound, Ionotophoresis 4mg /ml Dexamethasone, and aquatic therapy, Manual therapy.  All included unless contraindicated   PLAN FOR NEXT SESSION: Progressive ROM gains, strength, static balance improvements. Manual for mobility gains.    Chyrel Masson, PT, DPT, OCS, ATC 04/19/23  4:47 PM     Referring diagnosis? M17.11 (ICD-10-CM) - Primary osteoarthritis of right knee Treatment diagnosis? (if different than referring diagnosis) M25.561 What was this (referring dx) caused by? [x]  Surgery []  Fall []  Ongoing issue []  Arthritis []  Other: ____________  Laterality: [x]  Rt []  Lt []  Both  Check all possible CPT codes:  *CHOOSE 10 OR LESS*    []  97110 (Therapeutic Exercise)  []  92507 (SLP Treatment)  []  97112 (Neuro Re-ed)   []  92526 (Swallowing Treatment)   []  97116 (Gait Training)   []  [id number] (Cognitive Training, 1st 15 minutes) []  97140 (Manual Therapy)   []  97130 (Cognitive Training, each add'l 15 minutes)  []  97164 (Re-evaluation)                              []  Other, List CPT Code ____________  []  97530 (Therapeutic Activities)     []  97535 (Self Care)   [x]  All codes above (97110 - 97535)  []  97012 (Mechanical Traction)  []  97014 (E-stim Unattended)  [x]  97032 (E-stim manual)  []  97033 (Ionto)  []  16109 (Ultrasound) [x]  97750 (Physical Performance Training) []  [id number] (Aquatic Therapy) [x]  97016 (Vasopneumatic Device) []  [id number] (Paraffin) []  97034 (Contrast Bath) []  97597 (Wound Care 1st 20 sq cm) []  97598 (Wound Care each add'l 20 sq cm) []  97760 (Orthotic Fabrication, Fitting, Training Initial) []  [id number] (Prosthetic Management and Training Initial) []  [id number] (Orthotic or Prosthetic Training/ Modification  Subsequent)

## 2023-04-23 ENCOUNTER — Ambulatory Visit: Payer: Medicare PPO | Admitting: Rehabilitative and Restorative Service Providers"

## 2023-04-23 ENCOUNTER — Encounter: Payer: Self-pay | Admitting: Rehabilitative and Restorative Service Providers"

## 2023-04-23 DIAGNOSIS — G8929 Other chronic pain: Secondary | ICD-10-CM

## 2023-04-23 DIAGNOSIS — R600 Localized edema: Secondary | ICD-10-CM

## 2023-04-23 DIAGNOSIS — R262 Difficulty in walking, not elsewhere classified: Secondary | ICD-10-CM | POA: Diagnosis not present

## 2023-04-23 DIAGNOSIS — M25561 Pain in right knee: Secondary | ICD-10-CM

## 2023-04-23 DIAGNOSIS — M6281 Muscle weakness (generalized): Secondary | ICD-10-CM

## 2023-04-23 NOTE — Therapy (Signed)
OUTPATIENT PHYSICAL THERAPY TREATMENT   Patient Name: [name] [name] MRN: [id number] DOB:03-06-1927, 89 y.o., female Today's Date: 04/23/2023  END OF SESSION:  PT End of Session - 04/23/23 1413     Visit Number 6    Number of Visits 24    Date for PT Re-Evaluation 06/28/23    Authorization Type [employer] $20 copay    Authorization Time Period 12 visits until 8/15    Authorization - Visit Number 6    Authorization - Number of Visits 12    PT Start Time 1414    PT Stop Time 1514    PT Time Calculation (min) 60 min    Activity Tolerance Patient limited by pain    Behavior During Therapy [facility] for tasks assessed/performed                 Past Medical History:  Diagnosis Date   Anemia    Anxiety and depression 07/16/2009   ASD (atrial septal defect) per bubble study, 2004    Cardiomyopathy, normal cath 2007, normal EF per ECHO 2014    Hx of colonic polyps    Hypertension    Osteoarthritis    Osteopenia    Podagra 04/29/2014   RLS (restless legs syndrome) 07/10/2009   Past Surgical History:  Procedure Laterality Date   BACK SURGERY  2000   BREAST SURGERY     NEGATIVE BIOPSY   TONSILLECTOMY     TOTAL KNEE ARTHROPLASTY Right 03/22/2023   Procedure: RIGHT TOTAL KNEE ARTHROPLASTY;  Surgeon: Kathryne Hitch, MD;  Location: [facility];  Service: Orthopedics;  Laterality: Right;   Patient Active Problem List   Diagnosis Date Noted   Status post total right knee replacement 03/22/2023   Depression, recurrent (HCC) 02/20/2020   Prediabetes 01/26/2019   History of exercise intolerance 01/26/2019   HLD (hyperlipidemia) 04/07/2018   DJD (degenerative joint disease) 02/09/2010   PVCs (premature ventricular contractions) 07/20/2009   Anxiety and depression 07/16/2009   Cardiomyopathy (HCC) 04/23/2007   HTN (hypertension) 04/15/2007   Osteopenia 04/15/2007    PCP: Bary Leriche PA-C  REFERRING PROVIDER: Kathryne Hitch, MD  REFERRING DIAG: M17.11  (ICD-10-CM) - Primary osteoarthritis of right knee  THERAPY DIAG:  Chronic pain of right knee  Muscle weakness (generalized)  Difficulty in walking, not elsewhere classified  Localized edema  Rationale for Evaluation and Treatment: Rehabilitation  ONSET DATE: 03/22/2023 Rt TKA  SUBJECTIVE:    SUBJECTIVE STATEMENT: She indicated having tightness and low pain upon arrival today.  No SPC use yet.    PERTINENT HISTORY: HTN, OA, osteopenia, history of restless leg syndrome   PAIN:  NPRS scale:  3/10 Pain location: Rt knee  Pain description: "just pains every now and then that just hit"  Aggravating factors: static positioning Relieving factors: pain medicine, icing, movement    PRECAUTIONS: None   WEIGHT BEARING RESTRICTIONS: No   FALLS:  Has patient fallen in last 6 months? No   LIVING ENVIRONMENT: Lives with: lives with their family Lives in: House/apartment Stairs: flight of stairs to bedroom, rail on Lt going up.  Entry to house 3 stairs on Lt going up.  Has following equipment at home: FWW, SPC    OCCUPATION: Retired   PLOF: Independent, reading, meditation, watch tv.   Went to church, retirement groups   PATIENT GOALS: Return to activity, reduce pain   Next MD visit:  approx 4 weeks from 04/05/2023 MD visit.      OBJECTIVE: (objective measures  completed at initial evaluation unless otherwise dated)    PATIENT SURVEYS:  04/05/2023 FOTO intake: 23   predicted:  45   COGNITION: 04/05/2023 Overall cognitive status: WFL                        SENSATION: 04/05/2023 Not tested   EDEMA:  04/05/2023 Localized edema in Rt knee into lower leg.    MUSCLE LENGTH: 04/05/2023 No specific measurements   POSTURE:  04/05/2023:  forward trunk lean, rounded shoulders.    PALPATION: 04/05/2023 General tenderness around Rt knee, incision, guarding in Rt quad   LOWER EXTREMITY ROM:    ROM Right 04/05/2023 Right 04/12/23 Right 04/16/23 Right 04/23/2023  Hip flexion         Hip extension        Hip abduction        Hip adduction        Hip internal rotation        Hip external rotation        Knee flexion 56 AROM in supine heel slide  63* AAROM on Nustep with BUEs, 68* AROM seated at edge of mat table with knee flexion stretch 75 deg AAROM (strap + towel) seated Supine AROM heel slide 65  PROM heel slide 71  Pain limited   Knee extension -18 AROM in seated LAQ    -17* in knee extension stretch PROM seated   Seated AROM LAQ: -15  Supine AROM quad set -10    Ankle dorsiflexion        Ankle plantarflexion        Ankle inversion        Ankle eversion        04/05/2023:  pain noted in end ranges both direction Rt knee   (Blank rows = not tested)   LOWER EXTREMITY MMT:   MMT Right 04/05/2023 Left 04/05/2023  Hip flexion  3+/5  5/5  Hip extension      Hip abduction      Hip adduction      Hip internal rotation      Hip external rotation      Knee flexion 3+/5   5/5  Knee extension  2+/5  5/5  Ankle dorsiflexion  4/5 5/5   Ankle plantarflexion      Ankle inversion      Ankle eversion       (Blank rows = not tested)   LOWER EXTREMITY SPECIAL TESTS:  04/05/2023 No specific testing   FUNCTIONAL TESTS:  04/05/2023 18 inch chair transfer:  UE required, deviation to Lt leg WB  Rt SLS: unable unassisted   GAIT: 04/05/2023 FWW ambulation c reduced gait speed, step length bilateral, maintained knee flexion in stance lacking TKE.  Hip circumduction in swing to clear foot due to flexion restrictions.  TODAY'S TREATMENT                                                      DATE  04/23/2023 Therex: UBE UE/LE for ROM 5 mins  full circles with UE assist.  Incline calf stretch bilateral knee straight 30 sec x 3 Leg press double leg 62 lb 2 x 10  , Rt leg only  25 lbs x 15 Seated LAQ  c pause in end ranges flexion/extension x 15  Supine quad set 5 sec hold x 10 Rt leg Seated Rt knee alternating isometrics extension/flexion 5 sec x 8 each way in mid range approx. 70 deg.  Heel prop 3 mins  During manual, review of existing therex and HEP with patient and daughter for emphasis on routine flexion and extension mobility intervention to promote mobility gains in ROM.  Question and answer time with focus on long duration stretching.  Additional time spent in review.   Manual Seated Rt knee flexion c mobilization c movement IR/distraction.  Prolonged stretching into flexion and extension overpressure.   Vasopneumatic Device 10 mins Rt knee medium compression 34 deg in elevation with above exercises  TODAY'S TREATMENT                                                      DATE  04/19/2023 Therex: UBE UE/LE for ROM 5 mins partial to full circles Nustep lvl 5 UE/LE 6 mins Seated LAQ c pause in end ranges flexion/extension x 10, 2.5 lbs x 15 Supine quad set 5 sec hold x 10 Rt leg Seated Rt knee alternating isometrics extension/flexion 5 sec x 8 each way in mid range approx. 70 deg.  Heel prop to tolerance with vaso Ankle pumps in elevation c vaso throughout time after heel prop trial.  Manual Seated Rt knee flexion c mobilization c movement IR/distraction.  Prolonged stretching into flexion and extension overpressure.   Vasopneumatic Device 10 mins Rt knee medium compression 34 deg in elevation with above exercises  TODAY'S TREATMENT                                                      DATE: 04/16/23 Therapeutic Exercise: Seated knee flexion AAROM 2x10 w towel and strap (able to get up to 75 deg by last rep) Seated knee flexion AAROM w/o strap x10, towel to minimize friction  Sitting hamstring stretch 3x30sec w/ heel prop LAQ 2# x8 with PT assist for end range extension, cues for control and trunk mechanics LAQ x12 no resistance, cues for maximal ROM  Self massage with tennis  ball between exercises, to proximal quads RLE Verbal HEP review, remains appropriate, education provided     PATIENT EDUCATION:  04/19/2023 Education details: HEP, POC, rationale for interventions  Person educated: Patient Education method: Explanation, Demonstration, Verbal cues, and Handouts Education comprehension: verbalized understanding, returned demonstration, and verbal cues required   HOME EXERCISE PROGRAM: Access Code: [email or web address] URL: [email or web address] Date: 04/05/2023 Prepared by: Chyrel Masson  Exercises - Supine  Heel Slide  - 3-5 x daily - 7 x weekly - 1 sets - 10 reps - 5 hold - Seated Long Arc Quad  - 3-5 x daily - 7 x weekly - 1 sets - 5-10 reps - 2 hold - Seated Knee Flexion Extension AAROM with Overpressure  - 3-5 x daily - 7 x weekly - 1 sets - 5 reps - 10 hold - Supine Knee Extension Mobilization with Weight  - 3-5 x daily - 7 x weekly - 1 sets - 1 reps - to tolerance up to 15 mins hold - Seated Quad Set  - 3-5 x daily - 7 x weekly - 1 sets - 10 reps - 5 hold   ASSESSMENT:   CLINICAL IMPRESSION: ROM restrictions still primary and present in both direction for Rt knee. Fair tolerance to active and passive interventions to promote improved mobility of Rt knee.  Edema still noted in Rt LE at this time. Encouraged routine HEP switching between flexion and extension for Rt knee mobility gains.    OBJECTIVE IMPAIRMENTS: Abnormal gait, decreased activity tolerance, decreased balance, decreased coordination, decreased endurance, decreased mobility, difficulty walking, decreased ROM, decreased strength, hypomobility, increased edema, increased fascial restrictions, impaired perceived functional ability, increased muscle spasms, impaired flexibility, improper body mechanics, and pain.    ACTIVITY LIMITATIONS: carrying, lifting, bending, sitting, standing, squatting, sleeping, stairs, transfers, bed mobility, and locomotion level   PARTICIPATION LIMITATIONS:  meal prep, cleaning, laundry, interpersonal relationship, shopping, and community activity   PERSONAL FACTORS: Age and HTN, OA, osteopenia, history of restless leg syndrome are also affecting patient's functional outcome.    REHAB POTENTIAL: Good   CLINICAL DECISION MAKING: Stable/uncomplicated   EVALUATION COMPLEXITY: Low     GOALS: Goals reviewed with patient? Yes   SHORT TERM GOALS: (target date for Short term goals are 3 weeks 04/26/2023)    1.  Patient will demonstrate independent use of home exercise program to maintain progress from in clinic treatments.   Goal status: On going 04/19/2023   LONG TERM GOALS: (target dates for all long term goals are 12 weeks  06/28/2023 )   1. Patient will demonstrate/report pain at worst less than or equal to 2/10 to facilitate minimal limitation in daily activity secondary to pain symptoms.   Goal status: On going 04/19/2023   2. Patient will demonstrate independent use of home exercise program to facilitate ability to maintain/progress functional gains from skilled physical therapy services.   Goal status: On going 04/19/2023   3. Patient will demonstrate FOTO outcome > or = 45 % to indicate reduced disability due to condition.   Goal status: On going 04/19/2023   4.  Patient will demonstrate Rt LE MMT 5/5 throughout to faciltiate usual transfers, stairs, squatting at [facility] for daily life.    Goal status: On going 04/19/2023   5.  Patient will demonstrate Rt knee AROM 0-110 degrees to facilitate mobility for transfers, ambulation, and other daily activity at PLOF.  Goal status: On going 04/19/2023   6.  Patient will demonstrate independent ambulation community distances > 500 ft for community integration.  Goal status: On going 04/19/2023   7.  Patient will demonstrate ascending/descending stairs reciprocally with one hand UE assist for community integration.    Goal Status: On going 04/19/2023     PLAN:   PT FREQUENCY: 1-2x/week   PT  DURATION: 12 weeks   PLANNED INTERVENTIONS: Therapeutic exercises, Therapeutic activity, Neuro Muscular re-education, Balance training, Gait training, Patient/Family education, Joint mobilization,  Stair training, DME instructions, Dry Needling, Electrical stimulation, Traction, Cryotherapy, vasopneumatic deviceMoist heat, Taping, Ultrasound, Ionotophoresis 4mg /ml Dexamethasone, and aquatic therapy, Manual therapy.  All included unless contraindicated   PLAN FOR NEXT SESSION: Manual and therex for mobility gains.  Quad strengthening/activation as able.    Chyrel Masson, PT, DPT, OCS, ATC 04/23/23  3:12 PM     Referring diagnosis? M17.11 (ICD-10-CM) - Primary osteoarthritis of right knee Treatment diagnosis? (if different than referring diagnosis) M25.561 What was this (referring dx) caused by? [x]  Surgery []  Fall []  Ongoing issue []  Arthritis []  Other: ____________  Laterality: [x]  Rt []  Lt []  Both  Check all possible CPT codes:  *CHOOSE 10 OR LESS*    []  97110 (Therapeutic Exercise)  []  92507 (SLP Treatment)  []  97112 (Neuro Re-ed)   []  92526 (Swallowing Treatment)   []  97116 (Gait Training)   []  [id number] (Cognitive Training, 1st 15 minutes) []  97140 (Manual Therapy)   []  97130 (Cognitive Training, each add'l 15 minutes)  []  29562 (Re-evaluation)                              []  Other, List CPT Code ____________  []  97530 (Therapeutic Activities)     []  97535 (Self Care)   [x]  All codes above (97110 - 97535)  []  97012 (Mechanical Traction)  []  97014 (E-stim Unattended)  [x]  97032 (E-stim manual)  []  97033 (Ionto)  []  97035 (Ultrasound) [x]  97750 (Physical Performance Training) []  [id number] (Aquatic Therapy) [x]  97016 (Vasopneumatic Device) []  [id number] (Paraffin) []  97034 (Contrast Bath) []  97597 (Wound Care 1st 20 sq cm) []  97598 (Wound Care each add'l 20 sq cm) []  97760 (Orthotic Fabrication, Fitting, Training Initial) []  [id number] (Prosthetic Management and Training Initial) []   [id number] (Orthotic or Prosthetic Training/ Modification Subsequent)

## 2023-04-25 ENCOUNTER — Ambulatory Visit: Payer: Medicare PPO | Admitting: Rehabilitative and Restorative Service Providers"

## 2023-04-25 ENCOUNTER — Encounter: Payer: Self-pay | Admitting: Rehabilitative and Restorative Service Providers"

## 2023-04-25 DIAGNOSIS — R600 Localized edema: Secondary | ICD-10-CM

## 2023-04-25 DIAGNOSIS — M6281 Muscle weakness (generalized): Secondary | ICD-10-CM | POA: Diagnosis not present

## 2023-04-25 DIAGNOSIS — G8929 Other chronic pain: Secondary | ICD-10-CM | POA: Diagnosis not present

## 2023-04-25 DIAGNOSIS — M25561 Pain in right knee: Secondary | ICD-10-CM

## 2023-04-25 DIAGNOSIS — R262 Difficulty in walking, not elsewhere classified: Secondary | ICD-10-CM

## 2023-04-25 NOTE — Therapy (Signed)
OUTPATIENT PHYSICAL THERAPY TREATMENT   Patient Name: [name] [name] MRN: [id number] DOB:08-01-1927, 89 y.o., female Today's Date: 04/25/2023  END OF SESSION:  PT End of Session - 04/25/23 1525     Visit Number 7    Number of Visits 24    Date for PT Re-Evaluation 06/28/23    Authorization Type [employer] $20 copay    Authorization Time Period 12 visits until 8/15    Authorization - Visit Number 7    Authorization - Number of Visits 12    PT Start Time 1517    PT Stop Time 1607    PT Time Calculation (min) 50 min    Activity Tolerance Patient limited by pain    Behavior During Therapy [facility] for tasks assessed/performed                  Past Medical History:  Diagnosis Date   Anemia    Anxiety and depression 07/16/2009   ASD (atrial septal defect) per bubble study, 2004    Cardiomyopathy, normal cath 2007, normal EF per ECHO 2014    Hx of colonic polyps    Hypertension    Osteoarthritis    Osteopenia    Podagra 04/29/2014   RLS (restless legs syndrome) 07/10/2009   Past Surgical History:  Procedure Laterality Date   BACK SURGERY  2000   BREAST SURGERY     NEGATIVE BIOPSY   TONSILLECTOMY     TOTAL KNEE ARTHROPLASTY Right 03/22/2023   Procedure: RIGHT TOTAL KNEE ARTHROPLASTY;  Surgeon: Kathryne Hitch, MD;  Location: [facility];  Service: Orthopedics;  Laterality: Right;   Patient Active Problem List   Diagnosis Date Noted   Status post total right knee replacement 03/22/2023   Depression, recurrent (HCC) 02/20/2020   Prediabetes 01/26/2019   History of exercise intolerance 01/26/2019   HLD (hyperlipidemia) 04/07/2018   DJD (degenerative joint disease) 02/09/2010   PVCs (premature ventricular contractions) 07/20/2009   Anxiety and depression 07/16/2009   Cardiomyopathy (HCC) 04/23/2007   HTN (hypertension) 04/15/2007   Osteopenia 04/15/2007    PCP: Bary Leriche PA-C  REFERRING PROVIDER: Kathryne Hitch, MD  REFERRING DIAG:  M17.11 (ICD-10-CM) - Primary osteoarthritis of right knee  THERAPY DIAG:  Chronic pain of right knee  Muscle weakness (generalized)  Difficulty in walking, not elsewhere classified  Localized edema  Rationale for Evaluation and Treatment: Rehabilitation  ONSET DATE: 03/22/2023 Rt TKA  SUBJECTIVE:    SUBJECTIVE STATEMENT: Reported feeling similar overall.  Still tight.    PERTINENT HISTORY: HTN, OA, osteopenia, history of restless leg syndrome   PAIN:  NPRS scale:  3/10 Pain location: Rt knee  Pain description: "just pains every now and then that just hit"  Aggravating factors: static positioning Relieving factors: pain medicine, icing, movement    PRECAUTIONS: None   WEIGHT BEARING RESTRICTIONS: No   FALLS:  Has patient fallen in last 6 months? No   LIVING ENVIRONMENT: Lives with: lives with their family Lives in: House/apartment Stairs: flight of stairs to bedroom, rail on Lt going up.  Entry to house 3 stairs on Lt going up.  Has following equipment at home: FWW, SPC    OCCUPATION: Retired   PLOF: Independent, reading, meditation, watch tv.   Went to church, retirement groups   PATIENT GOALS: Return to activity, reduce pain   Next MD visit:  approx 4 weeks from 04/05/2023 MD visit.      OBJECTIVE: (objective measures completed at initial evaluation unless otherwise dated)  PATIENT SURVEYS:  04/05/2023 FOTO intake: 23   predicted:  45   COGNITION: 04/05/2023 Overall cognitive status: WFL                        SENSATION: 04/05/2023 Not tested   EDEMA:  04/05/2023 Localized edema in Rt knee into lower leg.    MUSCLE LENGTH: 04/05/2023 No specific measurements   POSTURE:  04/05/2023:  forward trunk lean, rounded shoulders.    PALPATION: 04/05/2023 General tenderness around Rt knee, incision, guarding in Rt quad   LOWER EXTREMITY ROM:    ROM Right 04/05/2023 Right 04/12/23 Right 04/16/23 Right 04/23/2023  Hip flexion        Hip extension         Hip abduction        Hip adduction        Hip internal rotation        Hip external rotation        Knee flexion 56 AROM in supine heel slide  63* AAROM on Nustep with BUEs, 68* AROM seated at edge of mat table with knee flexion stretch 75 deg AAROM (strap + towel) seated Supine AROM heel slide 65  PROM heel slide 71  Pain limited   Knee extension -18 AROM in seated LAQ    -17* in knee extension stretch PROM seated   Seated AROM LAQ: -15  Supine AROM quad set -10    Ankle dorsiflexion        Ankle plantarflexion        Ankle inversion        Ankle eversion        04/05/2023:  pain noted in end ranges both direction Rt knee   (Blank rows = not tested)   LOWER EXTREMITY MMT:   MMT Right 04/05/2023 Left 04/05/2023  Hip flexion  3+/5  5/5  Hip extension      Hip abduction      Hip adduction      Hip internal rotation      Hip external rotation      Knee flexion 3+/5   5/5  Knee extension  2+/5  5/5  Ankle dorsiflexion  4/5 5/5   Ankle plantarflexion      Ankle inversion      Ankle eversion       (Blank rows = not tested)   LOWER EXTREMITY SPECIAL TESTS:  04/05/2023 No specific testing   FUNCTIONAL TESTS:  04/05/2023 18 inch chair transfer:  UE required, deviation to Lt leg WB  Rt SLS: unable unassisted   GAIT: 04/05/2023 FWW ambulation c reduced gait speed, step length bilateral, maintained knee flexion in stance lacking TKE.  Hip circumduction in swing to clear foot due to flexion restrictions.  TODAY'S TREATMENT                                                      DATE  04/25/2023 Therex: UBE UE/LE for ROM 5 mins  full circles with UE assist.  Leg press double leg 68 lb 2 x 10  , Rt leg only  25 lbs 2 x 10  Seated LAQ c pause in end ranges flexion/extension x 15  Supine quad set 5 sec hold x 10 Rt  leg  Manual Seated Rt knee flexion c mobilization c movement IR/distraction.  Prolonged stretching into flexion and extension overpressure.   Vasopneumatic Device 10 mins Rt knee medium compression 34 deg in elevation with above exercises    TODAY'S TREATMENT                                                      DATE  04/23/2023 Therex: UBE UE/LE for ROM 5 mins  full circles with UE assist.  Incline calf stretch bilateral knee straight 30 sec x 3 Leg press double leg 62 lb 2 x 10  , Rt leg only  25 lbs x 15 Seated LAQ c pause in end ranges flexion/extension x 15  Supine quad set 5 sec hold x 10 Rt leg Seated Rt knee alternating isometrics extension/flexion 5 sec x 8 each way in mid range approx. 70 deg.  Heel prop 3 mins  During manual, review of existing therex and HEP with patient and daughter for emphasis on routine flexion and extension mobility intervention to promote mobility gains in ROM.  Question and answer time with focus on long duration stretching.  Additional time spent in review.   Manual Seated Rt knee flexion c mobilization c movement IR/distraction.  Prolonged stretching into flexion and extension overpressure.   Vasopneumatic Device 10 mins Rt knee medium compression 34 deg in elevation with above exercises  TODAY'S TREATMENT                                                      DATE  04/19/2023 Therex: UBE UE/LE for ROM 5 mins partial to full circles Nustep lvl 5 UE/LE 6 mins Seated LAQ c pause in end ranges flexion/extension x 10, 2.5 lbs x 15 Supine quad set 5 sec hold x 10 Rt leg Seated Rt knee alternating isometrics extension/flexion 5 sec x 8 each way in mid range approx. 70 deg.  Heel prop to tolerance with vaso Ankle pumps in elevation c vaso throughout time after heel prop trial.  Manual Seated Rt knee flexion c mobilization c movement IR/distraction.  Prolonged stretching into flexion and extension overpressure.   Vasopneumatic Device 10 mins Rt knee  medium compression 34 deg in elevation with above exercises  TODAY'S TREATMENT  DATE: 04/16/23 Therapeutic Exercise: Seated knee flexion AAROM 2x10 w towel and strap (able to get up to 75 deg by last rep) Seated knee flexion AAROM w/o strap x10, towel to minimize friction  Sitting hamstring stretch 3x30sec w/ heel prop LAQ 2# x8 with PT assist for end range extension, cues for control and trunk mechanics LAQ x12 no resistance, cues for maximal ROM  Self massage with tennis ball between exercises, to proximal quads RLE Verbal HEP review, remains appropriate, education provided     PATIENT EDUCATION:  04/19/2023 Education details: HEP, POC, rationale for interventions  Person educated: Patient Education method: Explanation, Demonstration, Verbal cues, and Handouts Education comprehension: verbalized understanding, returned demonstration, and verbal cues required   HOME EXERCISE PROGRAM: Access Code: [email or web address] URL: [email or web address] Date: 04/05/2023 Prepared by: Chyrel Masson  Exercises - Supine Heel Slide  - 3-5 x daily - 7 x weekly - 1 sets - 10 reps - 5 hold - Seated Long Arc Quad  - 3-5 x daily - 7 x weekly - 1 sets - 5-10 reps - 2 hold - Seated Knee Flexion Extension AAROM with Overpressure  - 3-5 x daily - 7 x weekly - 1 sets - 5 reps - 10 hold - Supine Knee Extension Mobilization with Weight  - 3-5 x daily - 7 x weekly - 1 sets - 1 reps - to tolerance up to 15 mins hold - Seated Quad Set  - 3-5 x daily - 7 x weekly - 1 sets - 10 reps - 5 hold   ASSESSMENT:   CLINICAL IMPRESSION: Recommend continued skilled PT services to progress mobility and strength for functional movements.  Ambulation c FWW continued at this time c deviations due to mobility restriction in Rt knee. Stiffness noted as well as guarding in mobility.   OBJECTIVE IMPAIRMENTS: Abnormal gait, decreased activity tolerance, decreased balance,  decreased coordination, decreased endurance, decreased mobility, difficulty walking, decreased ROM, decreased strength, hypomobility, increased edema, increased fascial restrictions, impaired perceived functional ability, increased muscle spasms, impaired flexibility, improper body mechanics, and pain.    ACTIVITY LIMITATIONS: carrying, lifting, bending, sitting, standing, squatting, sleeping, stairs, transfers, bed mobility, and locomotion level   PARTICIPATION LIMITATIONS: meal prep, cleaning, laundry, interpersonal relationship, shopping, and community activity   PERSONAL FACTORS: Age and HTN, OA, osteopenia, history of restless leg syndrome are also affecting patient's functional outcome.    REHAB POTENTIAL: Good   CLINICAL DECISION MAKING: Stable/uncomplicated   EVALUATION COMPLEXITY: Low     GOALS: Goals reviewed with patient? Yes   SHORT TERM GOALS: (target date for Short term goals are 3 weeks 04/26/2023)    1.  Patient will demonstrate independent use of home exercise program to maintain progress from in clinic treatments.   Goal status: On going 04/19/2023   LONG TERM GOALS: (target dates for all long term goals are 12 weeks  06/28/2023 )   1. Patient will demonstrate/report pain at worst less than or equal to 2/10 to facilitate minimal limitation in daily activity secondary to pain symptoms.   Goal status: On going 04/19/2023   2. Patient will demonstrate independent use of home exercise program to facilitate ability to maintain/progress functional gains from skilled physical therapy services.   Goal status: On going 04/19/2023   3. Patient will demonstrate FOTO outcome > or = 45 % to indicate reduced disability due to condition.   Goal status: On going 04/19/2023   4.  Patient will demonstrate Rt LE MMT 5/5 throughout to faciltiate usual transfers,  stairs, squatting at PLOF for daily life.    Goal status: On going 04/19/2023   5.  Patient will demonstrate Rt knee AROM 0-110  degrees to facilitate mobility for transfers, ambulation, and other daily activity at PLOF.  Goal status: On going 04/19/2023   6.  Patient will demonstrate independent ambulation community distances > 500 ft for community integration.  Goal status: On going 04/19/2023   7.  Patient will demonstrate ascending/descending stairs reciprocally with one hand UE assist for community integration.    Goal Status: On going 04/19/2023     PLAN:   PT FREQUENCY: 1-2x/week   PT DURATION: 12 weeks   PLANNED INTERVENTIONS: Therapeutic exercises, Therapeutic activity, Neuro Muscular re-education, Balance training, Gait training, Patient/Family education, Joint mobilization, Stair training, DME instructions, Dry Needling, Electrical stimulation, Traction, Cryotherapy, vasopneumatic deviceMoist heat, Taping, Ultrasound, Ionotophoresis 4mg /ml Dexamethasone, and aquatic therapy, Manual therapy.  All included unless contraindicated   PLAN FOR NEXT SESSION: Mobility gains, quad activation improvements.    Chyrel Masson, PT, DPT, OCS, ATC 04/25/23  4:00 PM     Referring diagnosis? M17.11 (ICD-10-CM) - Primary osteoarthritis of right knee Treatment diagnosis? (if different than referring diagnosis) M25.561 What was this (referring dx) caused by? [x]  Surgery []  Fall []  Ongoing issue []  Arthritis []  Other: ____________  Laterality: [x]  Rt []  Lt []  Both  Check all possible CPT codes:  *CHOOSE 10 OR LESS*    []  97110 (Therapeutic Exercise)  []  45409 (SLP Treatment)  []  97112 (Neuro Re-ed)   []  92526 (Swallowing Treatment)   []  97116 (Gait Training)   []  [id number] (Cognitive Training, 1st 15 minutes) []  97140 (Manual Therapy)   []  97130 (Cognitive Training, each add'l 15 minutes)  []  97164 (Re-evaluation)                              []  Other, List CPT Code ____________  []  97530 (Therapeutic Activities)     []  97535 (Self Care)   [x]  All codes above (97110 - 97535)  []  97012 (Mechanical Traction)  []   97014 (E-stim Unattended)  [x]  97032 (E-stim manual)  []  97033 (Ionto)  []  97035 (Ultrasound) [x]  97750 (Physical Performance Training) []  [id number] (Aquatic Therapy) [x]  97016 (Vasopneumatic Device) []  [id number] (Paraffin) []  97034 (Contrast Bath) []  97597 (Wound Care 1st 20 sq cm) []  97598 (Wound Care each add'l 20 sq cm) []  97760 (Orthotic Fabrication, Fitting, Training Initial) []  [id number] (Prosthetic Management and Training Initial) []  [id number] (Orthotic or Prosthetic Training/ Modification Subsequent)

## 2023-04-27 ENCOUNTER — Telehealth: Payer: Self-pay | Admitting: *Deleted

## 2023-04-27 ENCOUNTER — Other Ambulatory Visit: Payer: Self-pay | Admitting: Orthopaedic Surgery

## 2023-04-27 MED ORDER — METHOCARBAMOL 500 MG PO TABS: 500.0000 mg | ORAL_TABLET | Freq: Four times a day (QID) | ORAL | 1 refills | Status: DC | PRN

## 2023-04-27 MED ORDER — HYDROCODONE-ACETAMINOPHEN 5-325 MG PO TABS: 1.0000 | ORAL_TABLET | Freq: Four times a day (QID) | ORAL | 0 refills | Status: DC | PRN

## 2023-04-27 NOTE — Telephone Encounter (Signed)
Patient's daughter called and requested refill of pain medication as well as muscle relaxer. Thank you.

## 2023-04-30 ENCOUNTER — Ambulatory Visit: Payer: Medicare PPO | Admitting: Physical Therapy

## 2023-04-30 ENCOUNTER — Encounter: Payer: Self-pay | Admitting: Physical Therapy

## 2023-04-30 DIAGNOSIS — M6281 Muscle weakness (generalized): Secondary | ICD-10-CM

## 2023-04-30 DIAGNOSIS — M25561 Pain in right knee: Secondary | ICD-10-CM | POA: Diagnosis not present

## 2023-04-30 DIAGNOSIS — G8929 Other chronic pain: Secondary | ICD-10-CM | POA: Diagnosis not present

## 2023-04-30 DIAGNOSIS — R600 Localized edema: Secondary | ICD-10-CM | POA: Diagnosis not present

## 2023-04-30 DIAGNOSIS — R262 Difficulty in walking, not elsewhere classified: Secondary | ICD-10-CM

## 2023-04-30 NOTE — Therapy (Signed)
OUTPATIENT PHYSICAL THERAPY TREATMENT/Progress note Progress Note reporting period 04/05/23 to 04/30/23  See below for objective and subjective measurements relating to patients progress with PT.    Patient Name: [name] [name] MRN: [id number] DOB:[birth year], 89 y.o., female Today's Date: 04/30/2023  END OF SESSION:  PT End of Session - 04/30/23 1358     Visit Number 8    Number of Visits 24    Date for PT Re-Evaluation 06/28/23    Authorization Type [employer] $20 copay    Authorization Time Period 12 visits until 8/15    Authorization - Number of Visits 12    Progress Note Due on Visit 17    PT Start Time 1353    PT Stop Time 1442    PT Time Calculation (min) 49 min    Activity Tolerance Patient tolerated treatment well    Behavior During Therapy [facility] for tasks assessed/performed                  Past Medical History:  Diagnosis Date   Anemia    Anxiety and depression 07/16/2009   ASD (atrial septal defect) per bubble study, 2004    Cardiomyopathy, normal cath 2007, normal EF per ECHO 2014    Hx of colonic polyps    Hypertension    Osteoarthritis    Osteopenia    Podagra 04/29/2014   RLS (restless legs syndrome) 07/10/2009   Past Surgical History:  Procedure Laterality Date   BACK SURGERY  2000   BREAST SURGERY     NEGATIVE BIOPSY   TONSILLECTOMY     TOTAL KNEE ARTHROPLASTY Right 03/22/2023   Procedure: RIGHT TOTAL KNEE ARTHROPLASTY;  Surgeon: Kathryne Hitch, MD;  Location: [facility];  Service: Orthopedics;  Laterality: Right;   Patient Active Problem List   Diagnosis Date Noted   Status post total right knee replacement 03/22/2023   Depression, recurrent (HCC) 02/20/2020   Prediabetes 01/26/2019   History of exercise intolerance 01/26/2019   HLD (hyperlipidemia) 04/07/2018   DJD (degenerative joint disease) 02/09/2010   PVCs (premature ventricular contractions) 07/20/2009   Anxiety and depression 07/16/2009   Cardiomyopathy (HCC) 04/23/2007    HTN (hypertension) 04/15/2007   Osteopenia 04/15/2007    PCP: Bary Leriche PA-C  REFERRING PROVIDER: Kathryne Hitch, MD  REFERRING DIAG: M17.11 (ICD-10-CM) - Primary osteoarthritis of right knee  THERAPY DIAG:  Chronic pain of right knee  Muscle weakness (generalized)  Difficulty in walking, not elsewhere classified  Localized edema  Rationale for Evaluation and Treatment: Rehabilitation  ONSET DATE: 03/22/2023 Rt TKA  SUBJECTIVE:    SUBJECTIVE STATEMENT: Denies pain upon arrival but still with some pain at end range knee flexion   PERTINENT HISTORY: HTN, OA, osteopenia, history of restless leg syndrome   PAIN:  NPRS scale:  3/10 Pain location: Rt knee  Pain description: "just pains every now and then that just hit"  Aggravating factors: static positioning Relieving factors: pain medicine, icing, movement    PRECAUTIONS: None   WEIGHT BEARING RESTRICTIONS: No   FALLS:  Has patient fallen in last 6 months? No   LIVING ENVIRONMENT: Lives with: lives with their family Lives in: House/apartment Stairs: flight of stairs to bedroom, rail on Lt going up.  Entry to house 3 stairs on Lt going up.  Has following equipment at home: FWW, SPC    OCCUPATION: Retired   PLOF: Independent, reading, meditation, watch tv.   Went to church, retirement groups   PATIENT GOALS: Return to activity,  reduce pain   Next MD visit:  approx 4 weeks from 04/05/2023 MD visit.      OBJECTIVE: (objective measures completed at initial evaluation unless otherwise dated)    PATIENT SURVEYS:  04/05/2023 FOTO intake: 23   predicted:  45   COGNITION: 04/05/2023 Overall cognitive status: WFL                        SENSATION: 04/05/2023 Not tested   EDEMA:  04/05/2023 Localized edema in Rt knee into lower leg.    MUSCLE LENGTH: 04/05/2023 No specific measurements   POSTURE:  04/05/2023:  forward trunk lean, rounded shoulders.    PALPATION: 04/05/2023 General  tenderness around Rt knee, incision, guarding in Rt quad   LOWER EXTREMITY ROM:    ROM Right 04/05/2023 Right 04/12/23 Right 04/16/23 Right 04/23/2023 Right 04/30/23  Hip flexion         Hip extension         Hip abduction         Hip adduction         Hip internal rotation         Hip external rotation         Knee flexion 56 AROM in supine heel slide  63* AAROM on Nustep with BUEs, 68* AROM seated at edge of mat table with knee flexion stretch 75 deg AAROM (strap + towel) seated Supine AROM heel slide 65  PROM heel slide 71  Pain limited  PROM  Seated 90 deg  Knee extension -18 AROM in seated LAQ    -17* in knee extension stretch PROM seated   Seated AROM LAQ: -15  Supine AROM quad set -10   PROM  Supine -5  Ankle dorsiflexion         Ankle plantarflexion         Ankle inversion         Ankle eversion         04/05/2023:  pain noted in end ranges both direction Rt knee   (Blank rows = not tested)   LOWER EXTREMITY MMT:   MMT Right 04/05/2023 Left 04/05/2023 Right  Hip flexion  3+/5  5/5   Hip extension       Hip abduction       Hip adduction       Hip internal rotation       Hip external rotation       Knee flexion 3+/5   5/5   Knee extension  2+/5  5/5   Ankle dorsiflexion  4/5 5/5    Ankle plantarflexion       Ankle inversion       Ankle eversion        (Blank rows = not tested)   LOWER EXTREMITY SPECIAL TESTS:  04/05/2023 No specific testing   FUNCTIONAL TESTS:  04/05/2023 18 inch chair transfer:  UE required, deviation to Lt leg WB  Rt SLS: unable unassisted   GAIT: 04/05/2023 FWW ambulation c reduced gait speed, step length bilateral, maintained knee flexion in stance lacking TKE.  Hip circumduction in swing to clear foot due to flexion restrictions.  TODAY'S TREATMENT                                                       DATE  04/30/2023 Therex: UBE seat #11 UE/LE for ROM 6 mins  full circles with UE assist.  Incline board 30 sec X 3 Heel and toe raises X 15 bilat with UE support Step ups onto 4 inch step, X 10 reps leading with Rt leg, with one UE support  Leg press double leg 68 lb 2 x 10  , Rt leg only  25 lbs 2 x 10  Supine quad set 5 sec hold x 10 Rt leg Supine heelslides with strap  knee flexion AAROM 10 sec X 10  Manual Seated Rt knee flexion c mobilization c movement IR/distraction.  Prolonged stretching into flexion and extension overpressure.   Vasopneumatic Device 10 mins Rt knee medium compression 34 deg in elevation with above exercises   TODAY'S TREATMENT                                                      DATE  04/25/2023 Therex: UBE UE/LE for ROM 5 mins  full circles with UE assist.  Leg press double leg 68 lb 2 x 10  , Rt leg only  25 lbs 2 x 10  Seated LAQ c pause in end ranges flexion/extension x 15  Supine quad set 5 sec hold x 10 Rt leg  Manual Seated Rt knee flexion c mobilization c movement IR/distraction.  Prolonged stretching into flexion and extension overpressure.   Vasopneumatic Device 10 mins Rt knee medium compression 34 deg in elevation with above exercises    TODAY'S TREATMENT                                                      DATE  04/23/2023 Therex: UBE UE/LE for ROM 5 mins  full circles with UE assist.  Incline calf stretch bilateral knee straight 30 sec x 3 Leg press double leg 62 lb 2 x 10  , Rt leg only  25 lbs x 15 Seated LAQ c pause in end ranges flexion/extension x 15  Supine quad set 5 sec hold x 10 Rt leg Seated Rt knee alternating isometrics extension/flexion 5 sec x 8 each way in mid range approx. 70 deg.  Heel prop 3 mins  During manual, review of existing therex and HEP with patient and daughter for emphasis on routine flexion and extension mobility intervention to promote mobility gains in ROM.  Question and  answer time with focus on long duration stretching.  Additional time spent in review.   Manual Seated Rt knee flexion c mobilization c movement IR/distraction.  Prolonged stretching into flexion and extension overpressure.   Vasopneumatic Device 10 mins Rt knee medium compression 34 deg in elevation with above exercises    PATIENT EDUCATION:  04/19/2023 Education details: HEP, POC, rationale for interventions  Person educated: Patient Education method: Explanation, Demonstration, Verbal cues, and Handouts Education comprehension: verbalized  understanding, returned demonstration, and verbal cues required   HOME EXERCISE PROGRAM: Access Code: [email or web address] URL: [email or web address] Date: 04/05/2023 Prepared by: Chyrel Masson  Exercises - Supine Heel Slide  - 3-5 x daily - 7 x weekly - 1 sets - 10 reps - 5 hold - Seated Long Arc Quad  - 3-5 x daily - 7 x weekly - 1 sets - 5-10 reps - 2 hold - Seated Knee Flexion Extension AAROM with Overpressure  - 3-5 x daily - 7 x weekly - 1 sets - 5 reps - 10 hold - Supine Knee Extension Mobilization with Weight  - 3-5 x daily - 7 x weekly - 1 sets - 1 reps - to tolerance up to 15 mins hold - Seated Quad Set  - 3-5 x daily - 7 x weekly - 1 sets - 10 reps - 5 hold   ASSESSMENT:   CLINICAL IMPRESSION: Progress note performed as she will see MD this week for follow up. She has attended 7 PT visits and is improving overall with Rt knee ROM and strength. She does still have deficits particularly with knee flexion and I showed her additional stretch for this to add into HEP.  PT recommending to continue with PT to maximize function.   OBJECTIVE IMPAIRMENTS: Abnormal gait, decreased activity tolerance, decreased balance, decreased coordination, decreased endurance, decreased mobility, difficulty walking, decreased ROM, decreased strength, hypomobility, increased edema, increased fascial restrictions, impaired perceived functional ability,  increased muscle spasms, impaired flexibility, improper body mechanics, and pain.    ACTIVITY LIMITATIONS: carrying, lifting, bending, sitting, standing, squatting, sleeping, stairs, transfers, bed mobility, and locomotion level   PARTICIPATION LIMITATIONS: meal prep, cleaning, laundry, interpersonal relationship, shopping, and community activity   PERSONAL FACTORS: Age and HTN, OA, osteopenia, history of restless leg syndrome are also affecting patient's functional outcome.    REHAB POTENTIAL: Good   CLINICAL DECISION MAKING: Stable/uncomplicated   EVALUATION COMPLEXITY: Low     GOALS: Goals reviewed with patient? Yes   SHORT TERM GOALS: (target date for Short term goals are 3 weeks 04/26/2023)    1.  Patient will demonstrate independent use of home exercise program to maintain progress from in clinic treatments.   Goal status: On going 04/30/2023   LONG TERM GOALS: (target dates for all long term goals are 12 weeks  06/28/2023 )   1. Patient will demonstrate/report pain at worst less than or equal to 2/10 to facilitate minimal limitation in daily activity secondary to pain symptoms.   Goal status: On going 04/30/2023   2. Patient will demonstrate independent use of home exercise program to facilitate ability to maintain/progress functional gains from skilled physical therapy services.   Goal status: On going 04/30/2023   3. Patient will demonstrate FOTO outcome > or = 45 % to indicate reduced disability due to condition.   Goal status: On going 04/19/2023   4.  Patient will demonstrate Rt LE MMT 5/5 throughout to faciltiate usual transfers, stairs, squatting at [facility] for daily life.    Goal status: On going 04/30/2023   5.  Patient will demonstrate Rt knee AROM 0-110 degrees to facilitate mobility for transfers, ambulation, and other daily activity at PLOF.  Goal status: On going 04/30/2023   6.  Patient will demonstrate independent ambulation community distances > 500 ft for  community integration.  Goal status: On going 04/19/2023   7.  Patient will demonstrate ascending/descending stairs reciprocally with one hand UE assist for community integration.    Goal Status: On  going 04/19/2023     PLAN:   PT FREQUENCY: 1-2x/week   PT DURATION: 12 weeks   PLANNED INTERVENTIONS: Therapeutic exercises, Therapeutic activity, Neuro Muscular re-education, Balance training, Gait training, Patient/Family education, Joint mobilization, Stair training, DME instructions, Dry Needling, Electrical stimulation, Traction, Cryotherapy, vasopneumatic deviceMoist heat, Taping, Ultrasound, Ionotophoresis 4mg /ml Dexamethasone, and aquatic therapy, Manual therapy.  All included unless contraindicated   PLAN FOR NEXT SESSION: Mobility gains, quad activation improvements.    Ivery Quale, PT, DPT 04/30/23 2:33 PM      Referring diagnosis? M17.11 (ICD-10-CM) - Primary osteoarthritis of right knee Treatment diagnosis? (if different than referring diagnosis) M25.561 What was this (referring dx) caused by? [x]  Surgery []  Fall []  Ongoing issue []  Arthritis []  Other: ____________  Laterality: [x]  Rt []  Lt []  Both  Check all possible CPT codes:  *CHOOSE 10 OR LESS*    []  97110 (Therapeutic Exercise)  []  16109 (SLP Treatment)  []  97112 (Neuro Re-ed)   []  92526 (Swallowing Treatment)   []  97116 (Gait Training)   []  [id number] (Cognitive Training, 1st 15 minutes) []  97140 (Manual Therapy)   []  97130 (Cognitive Training, each add'l 15 minutes)  []  97164 (Re-evaluation)                              []  Other, List CPT Code ____________  []  97530 (Therapeutic Activities)     []  97535 (Self Care)   [x]  All codes above (97110 - 97535)  []  97012 (Mechanical Traction)  []  97014 (E-stim Unattended)  [x]  97032 (E-stim manual)  []  97033 (Ionto)  []  97035 (Ultrasound) [x]  97750 (Physical Performance Training) []  [id number] (Aquatic Therapy) [x]  97016 (Vasopneumatic Device) []  [id number]  (Paraffin) []  97034 (Contrast Bath) []  97597 (Wound Care 1st 20 sq cm) []  97598 (Wound Care each add'l 20 sq cm) []  97760 (Orthotic Fabrication, Fitting, Training Initial) []  [id number] (Prosthetic Management and Training Initial) []  [id number] (Orthotic or Prosthetic Training/ Modification Subsequent)

## 2023-05-02 ENCOUNTER — Ambulatory Visit: Payer: Medicare PPO | Admitting: Rehabilitative and Restorative Service Providers"

## 2023-05-02 ENCOUNTER — Encounter: Payer: Self-pay | Admitting: Rehabilitative and Restorative Service Providers"

## 2023-05-02 ENCOUNTER — Ambulatory Visit (INDEPENDENT_AMBULATORY_CARE_PROVIDER_SITE_OTHER): Payer: Medicare PPO | Admitting: Orthopaedic Surgery

## 2023-05-02 ENCOUNTER — Encounter: Payer: Self-pay | Admitting: Orthopaedic Surgery

## 2023-05-02 ENCOUNTER — Telehealth: Payer: Self-pay | Admitting: *Deleted

## 2023-05-02 DIAGNOSIS — M6281 Muscle weakness (generalized): Secondary | ICD-10-CM | POA: Diagnosis not present

## 2023-05-02 DIAGNOSIS — Z96651 Presence of right artificial knee joint: Secondary | ICD-10-CM

## 2023-05-02 DIAGNOSIS — M25561 Pain in right knee: Secondary | ICD-10-CM

## 2023-05-02 DIAGNOSIS — R262 Difficulty in walking, not elsewhere classified: Secondary | ICD-10-CM

## 2023-05-02 DIAGNOSIS — G8929 Other chronic pain: Secondary | ICD-10-CM

## 2023-05-02 DIAGNOSIS — R600 Localized edema: Secondary | ICD-10-CM

## 2023-05-02 NOTE — Progress Notes (Signed)
The patient is a 89 year old who is now 6 weeks status post a right total knee arthroplasty.  She is satisfied in terms of her pain control compared to what she had before surgery with just such a terrible arthritic knee.  She reports improved range of motion and strength.  Apparently a week or so ago her flexion was only about 65 degrees and in therapy just recently her daughter says they were able to flex her to 90 degrees.  Today here I am able to flex her to 90 degrees.  The knee is still swollen to be expected.  Since she is making progress, I elected to place a steroid injection in her right knee today as if we were going to manipulate the knee because I think this may help with her getting the knee bending even further since she has made some progress.  She agrees with this plan.  She tolerated this well.  Will see her back in 3 weeks and I would like a standing AP and lateral of her right knee at that visit and then we will assess the range of motion to determine what the next steps might be if needed at all.  Of note, she is having pain with her left thumb with active triggering.  At her next visit we could place a steroid injection in the A1 pulley if needed.

## 2023-05-02 NOTE — Therapy (Signed)
OUTPATIENT PHYSICAL THERAPY TREATMENT  Patient Name: [name] [name] MRN: [id number] DOB:[birth year], 89 y.o., female Today's Date: 05/02/2023  END OF SESSION:  PT End of Session - 05/02/23 1458     Visit Number 9    Number of Visits 24    Date for PT Re-Evaluation 06/28/23    Authorization Type [employer] $20 copay    Authorization Time Period 12 visits until 8/15    Authorization - Visit Number 9    Authorization - Number of Visits 12    Progress Note Due on Visit 17    PT Start Time 1452    PT Stop Time 1525    PT Time Calculation (min) 33 min    Activity Tolerance Patient tolerated treatment well    Behavior During Therapy [facility] for tasks assessed/performed                   Past Medical History:  Diagnosis Date   Anemia    Anxiety and depression 07/16/2009   ASD (atrial septal defect) per bubble study, 2004    Cardiomyopathy, normal cath 2007, normal EF per ECHO 2014    Hx of colonic polyps    Hypertension    Osteoarthritis    Osteopenia    Podagra 04/29/2014   RLS (restless legs syndrome) 07/10/2009   Past Surgical History:  Procedure Laterality Date   BACK SURGERY  2000   BREAST SURGERY     NEGATIVE BIOPSY   TONSILLECTOMY     TOTAL KNEE ARTHROPLASTY Right 03/22/2023   Procedure: RIGHT TOTAL KNEE ARTHROPLASTY;  Surgeon: Kathryne Hitch, MD;  Location: [facility];  Service: Orthopedics;  Laterality: Right;   Patient Active Problem List   Diagnosis Date Noted   Status post total right knee replacement 03/22/2023   Depression, recurrent (HCC) 02/20/2020   Prediabetes 01/26/2019   History of exercise intolerance 01/26/2019   HLD (hyperlipidemia) 04/07/2018   DJD (degenerative joint disease) 02/09/2010   PVCs (premature ventricular contractions) 07/20/2009   Anxiety and depression 07/16/2009   Cardiomyopathy (HCC) 04/23/2007   HTN (hypertension) 04/15/2007   Osteopenia 04/15/2007    PCP: Bary Leriche PA-C  REFERRING PROVIDER: Kathryne Hitch, MD  REFERRING DIAG: M17.11 (ICD-10-CM) - Primary osteoarthritis of right knee  THERAPY DIAG:  Chronic pain of right knee  Muscle weakness (generalized)  Difficulty in walking, not elsewhere classified  Localized edema  Rationale for Evaluation and Treatment: Rehabilitation  ONSET DATE: 03/22/2023 Rt TKA  SUBJECTIVE:    SUBJECTIVE STATEMENT: Rated 4/10 in knee, just had injection.  Saw MD prior to today's visit.  Reported having a problem in thumb as well.      PERTINENT HISTORY: HTN, OA, osteopenia, history of restless leg syndrome   PAIN:  NPRS scale:  3/10 Pain location: Rt knee  Pain description: "just pains every now and then that just hit"  Aggravating factors: static positioning Relieving factors: pain medicine, icing, movement    PRECAUTIONS: None   WEIGHT BEARING RESTRICTIONS: No   FALLS:  Has patient fallen in last 6 months? No   LIVING ENVIRONMENT: Lives with: lives with their family Lives in: House/apartment Stairs: flight of stairs to bedroom, rail on Lt going up.  Entry to house 3 stairs on Lt going up.  Has following equipment at home: FWW, SPC    OCCUPATION: Retired   PLOF: Independent, reading, meditation, watch tv.   Went to church, retirement groups   PATIENT GOALS: Return to activity, reduce pain  Next MD visit:  approx 4 weeks from 04/05/2023 MD visit.      OBJECTIVE: (objective measures completed at initial evaluation unless otherwise dated)    PATIENT SURVEYS:  04/05/2023 FOTO intake: 23   predicted:  45   COGNITION: 04/05/2023 Overall cognitive status: WFL                        SENSATION: 04/05/2023 Not tested   EDEMA:  04/05/2023 Localized edema in Rt knee into lower leg.    MUSCLE LENGTH: 04/05/2023 No specific measurements   POSTURE:  04/05/2023:  forward trunk lean, rounded shoulders.    PALPATION: 04/05/2023 General tenderness around Rt knee, incision, guarding in Rt quad   LOWER EXTREMITY ROM:     ROM Right 04/05/2023 Right 04/12/23 Right 04/16/23 Right 04/23/2023 Right 04/30/23  Hip flexion         Hip extension         Hip abduction         Hip adduction         Hip internal rotation         Hip external rotation         Knee flexion 56 AROM in supine heel slide  63* AAROM on Nustep with BUEs, 68* AROM seated at edge of mat table with knee flexion stretch 75 deg AAROM (strap + towel) seated Supine AROM heel slide 65  PROM heel slide 71  Pain limited  PROM  Seated 90 deg  Knee extension -18 AROM in seated LAQ    -17* in knee extension stretch PROM seated   Seated AROM LAQ: -15  Supine AROM quad set -10   PROM  Supine -5  Ankle dorsiflexion         Ankle plantarflexion         Ankle inversion         Ankle eversion         04/05/2023:  pain noted in end ranges both direction Rt knee   (Blank rows = not tested)   LOWER EXTREMITY MMT:   MMT Right 04/05/2023 Left 04/05/2023 Right  Hip flexion  3+/5  5/5   Hip extension       Hip abduction       Hip adduction       Hip internal rotation       Hip external rotation       Knee flexion 3+/5   5/5   Knee extension  2+/5  5/5   Ankle dorsiflexion  4/5 5/5    Ankle plantarflexion       Ankle inversion       Ankle eversion        (Blank rows = not tested)   LOWER EXTREMITY SPECIAL TESTS:  04/05/2023 No specific testing   FUNCTIONAL TESTS:  04/05/2023 18 inch chair transfer:  UE required, deviation to Lt leg WB  Rt SLS: unable unassisted   GAIT: 04/05/2023 FWW ambulation c reduced gait speed, step length bilateral, maintained knee flexion in stance lacking TKE.  Hip circumduction in swing to clear foot due to flexion restrictions.  TODAY'S TREATMENT                                                      DATE  05/02/2023 Therex: UBE seat #10 UE/LE for  ROM 7 mins  partial revolutions.  Seated Rt leg quad set 5 sec x10 Seated Rt leg quad set c SLR off floor 3 x 5 (cues for home)  Manual Seated Rt knee flexion c mobilization c movement IR/distraction.  Prolonged stretching into flexion and extension overpressure.   Vasopneumatic Device 10 mins Rt knee medium compression 34 deg in elevation with above exercises   TODAY'S TREATMENT                                                      DATE  04/30/2023 Therex: UBE seat #11 UE/LE for ROM 6 mins  full circles with UE assist.  Incline board 30 sec X 3 Heel and toe raises X 15 bilat with UE support Step ups onto 4 inch step, X 10 reps leading with Rt leg, with one UE support  Leg press double leg 68 lb 2 x 10  , Rt leg only  25 lbs 2 x 10  Supine quad set 5 sec hold x 10 Rt leg Supine heelslides with strap  knee flexion AAROM 10 sec X 10  Manual Seated Rt knee flexion c mobilization c movement IR/distraction.  Prolonged stretching into flexion and extension overpressure.   Vasopneumatic Device 10 mins Rt knee medium compression 34 deg in elevation with above exercises   TODAY'S TREATMENT                                                      DATE  04/25/2023 Therex: UBE UE/LE for ROM 5 mins  full circles with UE assist.  Leg press double leg 68 lb 2 x 10  , Rt leg only  25 lbs 2 x 10  Seated LAQ c pause in end ranges flexion/extension x 15  Supine quad set 5 sec hold x 10 Rt leg  Manual Seated Rt knee flexion c mobilization c movement IR/distraction.  Prolonged stretching into flexion and extension overpressure.   Vasopneumatic Device 10 mins Rt knee medium compression 34 deg in elevation with above exercises    TODAY'S TREATMENT                                                      DATE  04/23/2023 Therex: UBE UE/LE for ROM 5 mins  full circles with UE assist.  Incline calf stretch bilateral knee straight 30 sec x 3 Leg press double leg 62 lb 2 x 10  , Rt leg only  25 lbs x 15 Seated  LAQ c pause in end ranges flexion/extension x 15  Supine quad set 5 sec hold x 10 Rt leg Seated Rt  knee alternating isometrics extension/flexion 5 sec x 8 each way in mid range approx. 70 deg.  Heel prop 3 mins  During manual, review of existing therex and HEP with patient and daughter for emphasis on routine flexion and extension mobility intervention to promote mobility gains in ROM.  Question and answer time with focus on long duration stretching.  Additional time spent in review.   Manual Seated Rt knee flexion c mobilization c movement IR/distraction.  Prolonged stretching into flexion and extension overpressure.   Vasopneumatic Device 10 mins Rt knee medium compression 34 deg in elevation with above exercises    PATIENT EDUCATION:  04/19/2023 Education details: HEP, POC, rationale for interventions  Person educated: Patient Education method: Explanation, Demonstration, Verbal cues, and Handouts Education comprehension: verbalized understanding, returned demonstration, and verbal cues required   HOME EXERCISE PROGRAM: Access Code: [email or web address] URL: [email or web address] Date: 04/05/2023 Prepared by: Chyrel Masson  Exercises - Supine Heel Slide  - 3-5 x daily - 7 x weekly - 1 sets - 10 reps - 5 hold - Seated Long Arc Quad  - 3-5 x daily - 7 x weekly - 1 sets - 5-10 reps - 2 hold - Seated Knee Flexion Extension AAROM with Overpressure  - 3-5 x daily - 7 x weekly - 1 sets - 5 reps - 10 hold - Supine Knee Extension Mobilization with Weight  - 3-5 x daily - 7 x weekly - 1 sets - 1 reps - to tolerance up to 15 mins hold - Seated Quad Set  - 3-5 x daily - 7 x weekly - 1 sets - 10 reps - 5 hold   ASSESSMENT:   CLINICAL IMPRESSION: Arrival late for appointment due to MD appointment today.   OBJECTIVE IMPAIRMENTS: Abnormal gait, decreased activity tolerance, decreased balance, decreased coordination, decreased endurance, decreased mobility, difficulty walking, decreased  ROM, decreased strength, hypomobility, increased edema, increased fascial restrictions, impaired perceived functional ability, increased muscle spasms, impaired flexibility, improper body mechanics, and pain.    ACTIVITY LIMITATIONS: carrying, lifting, bending, sitting, standing, squatting, sleeping, stairs, transfers, bed mobility, and locomotion level   PARTICIPATION LIMITATIONS: meal prep, cleaning, laundry, interpersonal relationship, shopping, and community activity   PERSONAL FACTORS: Age and HTN, OA, osteopenia, history of restless leg syndrome are also affecting patient's functional outcome.    REHAB POTENTIAL: Good   CLINICAL DECISION MAKING: Stable/uncomplicated   EVALUATION COMPLEXITY: Low     GOALS: Goals reviewed with patient? Yes   SHORT TERM GOALS: (target date for Short term goals are 3 weeks 04/26/2023)    1.  Patient will demonstrate independent use of home exercise program to maintain progress from in clinic treatments.   Goal status: On going 04/30/2023   LONG TERM GOALS: (target dates for all long term goals are 12 weeks  06/28/2023 )   1. Patient will demonstrate/report pain at worst less than or equal to 2/10 to facilitate minimal limitation in daily activity secondary to pain symptoms.   Goal status: On going 04/30/2023   2. Patient will demonstrate independent use of home exercise program to facilitate ability to maintain/progress functional gains from skilled physical therapy services.   Goal status: On going 04/30/2023   3. Patient will demonstrate FOTO outcome > or = 45 % to indicate reduced disability due to condition.   Goal status: On going 04/19/2023   4.  Patient will demonstrate Rt LE MMT 5/5 throughout to faciltiate usual transfers, stairs, squatting at [facility] for daily life.    Goal status:  On going 04/30/2023   5.  Patient will demonstrate Rt knee AROM 0-110 degrees to facilitate mobility for transfers, ambulation, and other daily activity at PLOF.   Goal status: On going 04/30/2023   6.  Patient will demonstrate independent ambulation community distances > 500 ft for community integration.  Goal status: On going 04/19/2023   7.  Patient will demonstrate ascending/descending stairs reciprocally with one hand UE assist for community integration.    Goal Status: On going 04/19/2023     PLAN:   PT FREQUENCY: 1-2x/week   PT DURATION: 12 weeks   PLANNED INTERVENTIONS: Therapeutic exercises, Therapeutic activity, Neuro Muscular re-education, Balance training, Gait training, Patient/Family education, Joint mobilization, Stair training, DME instructions, Dry Needling, Electrical stimulation, Traction, Cryotherapy, vasopneumatic deviceMoist heat, Taping, Ultrasound, Ionotophoresis 4mg /ml Dexamethasone, and aquatic therapy, Manual therapy.  All included unless contraindicated   PLAN FOR NEXT SESSION: Mobility gains, quad activation improvements as able.    Chyrel Masson, PT, DPT, OCS, ATC 05/02/23  4:01 PM       Referring diagnosis? M17.11 (ICD-10-CM) - Primary osteoarthritis of right knee Treatment diagnosis? (if different than referring diagnosis) M25.561 What was this (referring dx) caused by? [x]  Surgery []  Fall []  Ongoing issue []  Arthritis []  Other: ____________  Laterality: [x]  Rt []  Lt []  Both  Check all possible CPT codes:  *CHOOSE 10 OR LESS*    []  97110 (Therapeutic Exercise)  []  92507 (SLP Treatment)  []  54098 (Neuro Re-ed)   []  92526 (Swallowing Treatment)   []  97116 (Gait Training)   []  [id number] (Cognitive Training, 1st 15 minutes) []  97140 (Manual Therapy)   []  97130 (Cognitive Training, each add'l 15 minutes)  []  11914 (Re-evaluation)                              []  Other, List CPT Code ____________  []  97530 (Therapeutic Activities)     []  97535 (Self Care)   [x]  All codes above (97110 - 97535)  []  97012 (Mechanical Traction)  []  97014 (E-stim Unattended)  [x]  97032 (E-stim manual)  []  97033 (Ionto)  []   97035 (Ultrasound) [x]  97750 (Physical Performance Training) []  [id number] (Aquatic Therapy) [x]  97016 (Vasopneumatic Device) []  [id number] (Paraffin) []  97034 (Contrast Bath) []  97597 (Wound Care 1st 20 sq cm) []  97598 (Wound Care each add'l 20 sq cm) []  97760 (Orthotic Fabrication, Fitting, Training Initial) []  [id number] (Prosthetic Management and Training Initial) []  [id number] (Orthotic or Prosthetic Training/ Modification Subsequent)

## 2023-05-02 NOTE — Telephone Encounter (Signed)
Ortho bundle in office visit completed. 

## 2023-05-05 ENCOUNTER — Other Ambulatory Visit: Payer: Self-pay | Admitting: Orthopaedic Surgery

## 2023-05-07 ENCOUNTER — Encounter: Payer: Self-pay | Admitting: Rehabilitative and Restorative Service Providers"

## 2023-05-07 ENCOUNTER — Ambulatory Visit: Payer: Medicare PPO | Admitting: Rehabilitative and Restorative Service Providers"

## 2023-05-07 DIAGNOSIS — M6281 Muscle weakness (generalized): Secondary | ICD-10-CM | POA: Diagnosis not present

## 2023-05-07 DIAGNOSIS — G8929 Other chronic pain: Secondary | ICD-10-CM

## 2023-05-07 DIAGNOSIS — M25561 Pain in right knee: Secondary | ICD-10-CM

## 2023-05-07 DIAGNOSIS — R600 Localized edema: Secondary | ICD-10-CM | POA: Diagnosis not present

## 2023-05-07 DIAGNOSIS — R262 Difficulty in walking, not elsewhere classified: Secondary | ICD-10-CM

## 2023-05-07 NOTE — Therapy (Signed)
OUTPATIENT PHYSICAL THERAPY TREATMENT  Patient Name: [name] [name] MRN: [id number] DOB:01/15/1927, 89 y.o., female Today's Date: 05/07/2023  END OF SESSION:  PT End of Session - 05/07/23 1426     Visit Number 10    Number of Visits 24    Date for PT Re-Evaluation 06/28/23    Authorization Type [employer] $20 copay    Authorization Time Period 12 visits until 8/15    Authorization - Visit Number 10    Authorization - Number of Visits 12    Progress Note Due on Visit 17    PT Start Time 1427    PT Stop Time 1517    PT Time Calculation (min) 50 min    Activity Tolerance Patient tolerated treatment well    Behavior During Therapy [facility] for tasks assessed/performed                    Past Medical History:  Diagnosis Date   Anemia    Anxiety and depression 07/16/2009   ASD (atrial septal defect) per bubble study, 2004    Cardiomyopathy, normal cath 2007, normal EF per ECHO 2014    Hx of colonic polyps    Hypertension    Osteoarthritis    Osteopenia    Podagra 04/29/2014   RLS (restless legs syndrome) 07/10/2009   Past Surgical History:  Procedure Laterality Date   BACK SURGERY  2000   BREAST SURGERY     NEGATIVE BIOPSY   TONSILLECTOMY     TOTAL KNEE ARTHROPLASTY Right 03/22/2023   Procedure: RIGHT TOTAL KNEE ARTHROPLASTY;  Surgeon: Kathryne Hitch, MD;  Location: [facility];  Service: Orthopedics;  Laterality: Right;   Patient Active Problem List   Diagnosis Date Noted   Status post total right knee replacement 03/22/2023   Depression, recurrent (HCC) 02/20/2020   Prediabetes 01/26/2019   History of exercise intolerance 01/26/2019   HLD (hyperlipidemia) 04/07/2018   DJD (degenerative joint disease) 02/09/2010   PVCs (premature ventricular contractions) 07/20/2009   Anxiety and depression 07/16/2009   Cardiomyopathy (HCC) 04/23/2007   HTN (hypertension) 04/15/2007   Osteopenia 04/15/2007    PCP: Bary Leriche PA-C  REFERRING PROVIDER:  Kathryne Hitch, MD  REFERRING DIAG: M17.11 (ICD-10-CM) - Primary osteoarthritis of right knee  THERAPY DIAG:  Chronic pain of right knee  Muscle weakness (generalized)  Difficulty in walking, not elsewhere classified  Localized edema  Rationale for Evaluation and Treatment: Rehabilitation  ONSET DATE: 03/22/2023 Rt TKA  SUBJECTIVE:    SUBJECTIVE STATEMENT: She indicated no pain with walking today into the clinic.  Reported working hard on exercises at home.     PERTINENT HISTORY: HTN, OA, osteopenia, history of restless leg syndrome   PAIN:  NPRS scale:  0/10 upon arrival.  Pain location: Rt knee  Pain description: "just pains every now and then that just hit"  Aggravating factors: static positioning Relieving factors: pain medicine, icing, movement    PRECAUTIONS: None   WEIGHT BEARING RESTRICTIONS: No   FALLS:  Has patient fallen in last 6 months? No   LIVING ENVIRONMENT: Lives with: lives with their family Lives in: House/apartment Stairs: flight of stairs to bedroom, rail on Lt going up.  Entry to house 3 stairs on Lt going up.  Has following equipment at home: FWW, SPC    OCCUPATION: Retired   PLOF: Independent, reading, meditation, watch tv.   Went to church, retirement groups   PATIENT GOALS: Return to activity, reduce pain   Next  MD visit:  approx 4 weeks from 04/05/2023 MD visit.      OBJECTIVE: (objective measures completed at initial evaluation unless otherwise dated)    PATIENT SURVEYS:  04/05/2023 FOTO intake: 23   predicted:  45   COGNITION: 04/05/2023 Overall cognitive status: WFL                        SENSATION: 04/05/2023 Not tested   EDEMA:  04/05/2023 Localized edema in Rt knee into lower leg.    MUSCLE LENGTH: 04/05/2023 No specific measurements   POSTURE:  04/05/2023:  forward trunk lean, rounded shoulders.    PALPATION: 04/05/2023 General tenderness around Rt knee, incision, guarding in Rt quad   LOWER EXTREMITY  ROM:    ROM Right 04/05/2023 Right 04/12/23 Right 04/16/23 Right 04/23/2023 Right 04/30/23  Hip flexion         Hip extension         Hip abduction         Hip adduction         Hip internal rotation         Hip external rotation         Knee flexion 56 AROM in supine heel slide  63* AAROM on Nustep with BUEs, 68* AROM seated at edge of mat table with knee flexion stretch 75 deg AAROM (strap + towel) seated Supine AROM heel slide 65  PROM heel slide 71  Pain limited  PROM  Seated 90 deg  Knee extension -18 AROM in seated LAQ    -17* in knee extension stretch PROM seated   Seated AROM LAQ: -15  Supine AROM quad set -10   PROM  Supine -5  Ankle dorsiflexion         Ankle plantarflexion         Ankle inversion         Ankle eversion         04/05/2023:  pain noted in end ranges both direction Rt knee   (Blank rows = not tested)   LOWER EXTREMITY MMT:   MMT Right 04/05/2023 Left 04/05/2023 Right  Hip flexion  3+/5  5/5   Hip extension       Hip abduction       Hip adduction       Hip internal rotation       Hip external rotation       Knee flexion 3+/5   5/5   Knee extension  2+/5  5/5   Ankle dorsiflexion  4/5 5/5    Ankle plantarflexion       Ankle inversion       Ankle eversion        (Blank rows = not tested)   LOWER EXTREMITY SPECIAL TESTS:  04/05/2023 No specific testing   FUNCTIONAL TESTS:  04/05/2023 18 inch chair transfer:  UE required, deviation to Lt leg WB  Rt SLS: unable unassisted   GAIT: 05/07/2023:  Ambulation in clinic c SPC in Lt UE c SBA.   04/05/2023 FWW ambulation c reduced gait speed, step length bilateral, maintained knee flexion in stance lacking TKE.  Hip circumduction in swing to clear foot due to flexion restrictions.  TODAY'S TREATMENT                                                       DATE  05/07/2023 Therex: UBE seat #10 UE/LE for ROM 8 mins  full circles with UE assist.  Incline board 30 sec X 3 Standing Green band TKE 5 sec hold x 15  Step ups onto 4 inch step WB on Rt leg single hand assist on bar x 15 c SBA Leg press  Lt leg only 31 lbs x 20  Heel prop 4 mins c cues for 1-2 lbs at home for extra stretching   Gait Training [facility] training c verbal cues and demonstration for sequencing.  Performed household distances in clinic several times between therex activity.  18ft, 25 ft, 75 ft  Neuro Re-ed Alternating toe tapping 4 inch step x 10 bilateral with SBA to occasional min A  Modified tandem stance front foot elevated on 4 inch step 1 min x 1 bilateral c CGA to occasional min A  Vasopneumatic Device 10 mins Rt knee medium compression 34 deg in elevation with above exercises   TODAY'S TREATMENT                                                      DATE  05/02/2023 Therex: UBE seat #10 UE/LE for ROM 7 mins  partial revolutions.  Seated Rt leg quad set 5 sec x10 Seated Rt leg quad set c SLR off floor 3 x 5 (cues for home)  Manual Seated Rt knee flexion c mobilization c movement IR/distraction.  Prolonged stretching into flexion and extension overpressure.   Vasopneumatic Device 10 mins Rt knee medium compression 34 deg in elevation with above exercises   TODAY'S TREATMENT                                                      DATE  04/30/2023 Therex: UBE seat #11 UE/LE for ROM 6 mins  full circles with UE assist.  Incline board 30 sec X 3 Heel and toe raises X 15 bilat with UE support Step ups onto 4 inch step, X 10 reps leading with Rt leg, with one UE support  Leg press double leg 68 lb 2 x 10  , Rt leg only  25 lbs 2 x 10  Supine quad set 5 sec hold x 10 Rt leg Supine heelslides with strap  knee flexion AAROM 10 sec X 10  Manual Seated Rt knee flexion c mobilization c movement IR/distraction.  Prolonged stretching into flexion and extension  overpressure.   Vasopneumatic Device 10 mins Rt knee medium compression 34 deg in elevation with above exercises   TODAY'S TREATMENT  DATE  04/25/2023 Therex: UBE UE/LE for ROM 5 mins  full circles with UE assist.  Leg press double leg 68 lb 2 x 10  , Rt leg only  25 lbs 2 x 10  Seated LAQ c pause in end ranges flexion/extension x 15  Supine quad set 5 sec hold x 10 Rt leg  Manual Seated Rt knee flexion c mobilization c movement IR/distraction.  Prolonged stretching into flexion and extension overpressure.   Vasopneumatic Device 10 mins Rt knee medium compression 34 deg in elevation with above exercises   PATIENT EDUCATION:  04/19/2023 Education details: HEP, POC, rationale for interventions  Person educated: Patient Education method: Explanation, Demonstration, Verbal cues, and Handouts Education comprehension: verbalized understanding, returned demonstration, and verbal cues required   HOME EXERCISE PROGRAM: Access Code: [email or web address] URL: [email or web address] Date: 04/05/2023 Prepared by: Chyrel Masson  Exercises - Supine Heel Slide  - 3-5 x daily - 7 x weekly - 1 sets - 10 reps - 5 hold - Seated Long Arc Quad  - 3-5 x daily - 7 x weekly - 1 sets - 5-10 reps - 2 hold - Seated Knee Flexion Extension AAROM with Overpressure  - 3-5 x daily - 7 x weekly - 1 sets - 5 reps - 10 hold - Supine Knee Extension Mobilization with Weight  - 3-5 x daily - 7 x weekly - 1 sets - 1 reps - to tolerance up to 15 mins hold - Seated Quad Set  - 3-5 x daily - 7 x weekly - 1 sets - 10 reps - 5 hold   ASSESSMENT:   CLINICAL IMPRESSION: Extension impairment has continued to impact strengthening ability and functional WB activity due to deviation.   OBJECTIVE IMPAIRMENTS: Abnormal gait, decreased activity tolerance, decreased balance, decreased coordination, decreased endurance, decreased mobility, difficulty walking, decreased ROM,  decreased strength, hypomobility, increased edema, increased fascial restrictions, impaired perceived functional ability, increased muscle spasms, impaired flexibility, improper body mechanics, and pain.    ACTIVITY LIMITATIONS: carrying, lifting, bending, sitting, standing, squatting, sleeping, stairs, transfers, bed mobility, and locomotion level   PARTICIPATION LIMITATIONS: meal prep, cleaning, laundry, interpersonal relationship, shopping, and community activity   PERSONAL FACTORS: Age and HTN, OA, osteopenia, history of restless leg syndrome are also affecting patient's functional outcome.    REHAB POTENTIAL: Good   CLINICAL DECISION MAKING: Stable/uncomplicated   EVALUATION COMPLEXITY: Low     GOALS: Goals reviewed with patient? Yes   SHORT TERM GOALS: (target date for Short term goals are 3 weeks 04/26/2023)    1.  Patient will demonstrate independent use of home exercise program to maintain progress from in clinic treatments.   Goal status: On going 04/30/2023   LONG TERM GOALS: (target dates for all long term goals are 12 weeks  06/28/2023 )   1. Patient will demonstrate/report pain at worst less than or equal to 2/10 to facilitate minimal limitation in daily activity secondary to pain symptoms.   Goal status: On going 04/30/2023   2. Patient will demonstrate independent use of home exercise program to facilitate ability to maintain/progress functional gains from skilled physical therapy services.   Goal status: On going 04/30/2023   3. Patient will demonstrate FOTO outcome > or = 45 % to indicate reduced disability due to condition.   Goal status: On going 04/19/2023   4.  Patient will demonstrate Rt LE MMT 5/5 throughout to faciltiate usual transfers, stairs, squatting at [facility] for daily life.    Goal status: On going 04/30/2023  5.  Patient will demonstrate Rt knee AROM 0-110 degrees to facilitate mobility for transfers, ambulation, and other daily activity at PLOF.   Goal status: On going 04/30/2023   6.  Patient will demonstrate independent ambulation community distances > 500 ft for community integration.  Goal status: On going 04/19/2023   7.  Patient will demonstrate ascending/descending stairs reciprocally with one hand UE assist for community integration.    Goal Status: On going 04/19/2023     PLAN:   PT FREQUENCY: 1-2x/week   PT DURATION: 12 weeks   PLANNED INTERVENTIONS: Therapeutic exercises, Therapeutic activity, Neuro Muscular re-education, Balance training, Gait training, Patient/Family education, Joint mobilization, Stair training, DME instructions, Dry Needling, Electrical stimulation, Traction, Cryotherapy, vasopneumatic deviceMoist heat, Taping, Ultrasound, Ionotophoresis 4mg /ml Dexamethasone, and aquatic therapy, Manual therapy.  All included unless contraindicated   PLAN FOR NEXT SESSION: Extension gains and overall strengthening, balance improvements.   Chyrel Masson, PT, DPT, OCS, ATC 05/07/23  3:34 PM       Referring diagnosis? M17.11 (ICD-10-CM) - Primary osteoarthritis of right knee Treatment diagnosis? (if different than referring diagnosis) M25.561 What was this (referring dx) caused by? [x]  Surgery []  Fall []  Ongoing issue []  Arthritis []  Other: ____________  Laterality: [x]  Rt []  Lt []  Both  Check all possible CPT codes:  *CHOOSE 10 OR LESS*    []  97110 (Therapeutic Exercise)  []  92507 (SLP Treatment)  []  97112 (Neuro Re-ed)   []  92526 (Swallowing Treatment)   []  97116 (Gait Training)   []  [id number] (Cognitive Training, 1st 15 minutes) []  97140 (Manual Therapy)   []  97130 (Cognitive Training, each add'l 15 minutes)  []  97164 (Re-evaluation)                              []  Other, List CPT Code ____________  []  97530 (Therapeutic Activities)     []  97535 (Self Care)   [x]  All codes above (97110 - 97535)  []  97012 (Mechanical Traction)  []  97014 (E-stim Unattended)  [x]  97032 (E-stim manual)  []  97033  (Ionto)  []  97035 (Ultrasound) [x]  97750 (Physical Performance Training) []  [id number] (Aquatic Therapy) [x]  97016 (Vasopneumatic Device) []  [id number] (Paraffin) []  97034 (Contrast Bath) []  97597 (Wound Care 1st 20 sq cm) []  97598 (Wound Care each add'l 20 sq cm) []  97760 (Orthotic Fabrication, Fitting, Training Initial) []  [id number] (Prosthetic Management and Training Initial) []  [id number] (Orthotic or Prosthetic Training/ Modification Subsequent)

## 2023-05-09 ENCOUNTER — Telehealth: Payer: Self-pay | Admitting: *Deleted

## 2023-05-09 ENCOUNTER — Encounter: Payer: Self-pay | Admitting: Rehabilitative and Restorative Service Providers"

## 2023-05-09 ENCOUNTER — Ambulatory Visit: Payer: Medicare PPO | Admitting: Rehabilitative and Restorative Service Providers"

## 2023-05-09 ENCOUNTER — Other Ambulatory Visit: Payer: Self-pay | Admitting: Physician Assistant

## 2023-05-09 DIAGNOSIS — R262 Difficulty in walking, not elsewhere classified: Secondary | ICD-10-CM

## 2023-05-09 DIAGNOSIS — G8929 Other chronic pain: Secondary | ICD-10-CM | POA: Diagnosis not present

## 2023-05-09 DIAGNOSIS — M25561 Pain in right knee: Secondary | ICD-10-CM | POA: Diagnosis not present

## 2023-05-09 DIAGNOSIS — R600 Localized edema: Secondary | ICD-10-CM | POA: Diagnosis not present

## 2023-05-09 DIAGNOSIS — M6281 Muscle weakness (generalized): Secondary | ICD-10-CM | POA: Diagnosis not present

## 2023-05-09 MED ORDER — HYDROCODONE-ACETAMINOPHEN 5-325 MG PO TABS: 1.0000 | ORAL_TABLET | Freq: Four times a day (QID) | ORAL | 0 refills | Status: DC | PRN

## 2023-05-09 MED ORDER — METHOCARBAMOL 500 MG PO TABS: 500.0000 mg | ORAL_TABLET | Freq: Four times a day (QID) | ORAL | 1 refills | Status: DC | PRN

## 2023-05-09 NOTE — Therapy (Signed)
OUTPATIENT PHYSICAL THERAPY TREATMENT  Patient Name: [name] [name] MRN: [id number] DOB:1927/10/29, 89 y.o., female Today's Date: 05/09/2023  END OF SESSION:  PT End of Session - 05/09/23 1441     Visit Number 11    Number of Visits 24    Date for PT Re-Evaluation 06/28/23    Authorization Type [employer] $20 copay    Authorization Time Period 12 visits until 8/15    Authorization - Visit Number 11    Authorization - Number of Visits 12    Progress Note Due on Visit 17    PT Start Time 1434    PT Stop Time 1519    PT Time Calculation (min) 45 min    Activity Tolerance Patient tolerated treatment well    Behavior During Therapy [facility] for tasks assessed/performed                     Past Medical History:  Diagnosis Date   Anemia    Anxiety and depression 07/16/2009   ASD (atrial septal defect) per bubble study, 2004    Cardiomyopathy, normal cath 2007, normal EF per ECHO 2014    Hx of colonic polyps    Hypertension    Osteoarthritis    Osteopenia    Podagra 04/29/2014   RLS (restless legs syndrome) 07/10/2009   Past Surgical History:  Procedure Laterality Date   BACK SURGERY  2000   BREAST SURGERY     NEGATIVE BIOPSY   TONSILLECTOMY     TOTAL KNEE ARTHROPLASTY Right 03/22/2023   Procedure: RIGHT TOTAL KNEE ARTHROPLASTY;  Surgeon: Kathryne Hitch, MD;  Location: [facility];  Service: Orthopedics;  Laterality: Right;   Patient Active Problem List   Diagnosis Date Noted   Status post total right knee replacement 03/22/2023   Depression, recurrent (HCC) 02/20/2020   Prediabetes 01/26/2019   History of exercise intolerance 01/26/2019   HLD (hyperlipidemia) 04/07/2018   DJD (degenerative joint disease) 02/09/2010   PVCs (premature ventricular contractions) 07/20/2009   Anxiety and depression 07/16/2009   Cardiomyopathy (HCC) 04/23/2007   HTN (hypertension) 04/15/2007   Osteopenia 04/15/2007    PCP: Bary Leriche PA-C  REFERRING PROVIDER:  Kathryne Hitch, MD  REFERRING DIAG: M17.11 (ICD-10-CM) - Primary osteoarthritis of right knee  THERAPY DIAG:  Chronic pain of right knee  Muscle weakness (generalized)  Difficulty in walking, not elsewhere classified  Localized edema  Rationale for Evaluation and Treatment: Rehabilitation  ONSET DATE: 03/22/2023 Rt TKA  SUBJECTIVE:    SUBJECTIVE STATEMENT: She indicated doing exercise as she can.    PERTINENT HISTORY: HTN, OA, osteopenia, history of restless leg syndrome   PAIN:  NPRS scale:  0/10 upon arrival.  Pain location: Rt knee  Pain description: "just pains every now and then that just hit"  Aggravating factors: static positioning Relieving factors: pain medicine, icing, movement    PRECAUTIONS: None   WEIGHT BEARING RESTRICTIONS: No   FALLS:  Has patient fallen in last 6 months? No   LIVING ENVIRONMENT: Lives with: lives with their family Lives in: House/apartment Stairs: flight of stairs to bedroom, rail on Lt going up.  Entry to house 3 stairs on Lt going up.  Has following equipment at home: FWW, SPC    OCCUPATION: Retired   PLOF: Independent, reading, meditation, watch tv.   Went to church, retirement groups   PATIENT GOALS: Return to activity, reduce pain   Next MD visit:  approx 4 weeks from 04/05/2023 MD visit.  OBJECTIVE: (objective measures completed at initial evaluation unless otherwise dated)    PATIENT SURVEYS:  04/05/2023 FOTO intake: 23   predicted:  45   COGNITION: 04/05/2023 Overall cognitive status: WFL                        SENSATION: 04/05/2023 Not tested   EDEMA:  04/05/2023 Localized edema in Rt knee into lower leg.    MUSCLE LENGTH: 04/05/2023 No specific measurements   POSTURE:  04/05/2023:  forward trunk lean, rounded shoulders.    PALPATION: 04/05/2023 General tenderness around Rt knee, incision, guarding in Rt quad   LOWER EXTREMITY ROM:    ROM Right 04/05/2023 Right 04/12/23 Right 04/16/23  Right 04/23/2023 Right 04/30/23  Hip flexion         Hip extension         Hip abduction         Hip adduction         Hip internal rotation         Hip external rotation         Knee flexion 56 AROM in supine heel slide  63* AAROM on Nustep with BUEs, 68* AROM seated at edge of mat table with knee flexion stretch 75 deg AAROM (strap + towel) seated Supine AROM heel slide 65  PROM heel slide 71  Pain limited  PROM  Seated 90 deg  Knee extension -18 AROM in seated LAQ    -17* in knee extension stretch PROM seated   Seated AROM LAQ: -15  Supine AROM quad set -10   PROM  Supine -5  Ankle dorsiflexion         Ankle plantarflexion         Ankle inversion         Ankle eversion         04/05/2023:  pain noted in end ranges both direction Rt knee   (Blank rows = not tested)   LOWER EXTREMITY MMT:   MMT Right 04/05/2023 Left 04/05/2023 Right 05/09/2023  Hip flexion  3+/5  5/5   Hip extension       Hip abduction       Hip adduction       Hip internal rotation       Hip external rotation       Knee flexion 3+/5   5/5   Knee extension  2+/5  5/5   Ankle dorsiflexion  4/5 5/5    Ankle plantarflexion       Ankle inversion       Ankle eversion        (Blank rows = not tested)   LOWER EXTREMITY SPECIAL TESTS:  04/05/2023 No specific testing   FUNCTIONAL TESTS:  04/05/2023 18 inch chair transfer:  UE required, deviation to Lt leg WB  Rt SLS: unable unassisted   GAIT: 05/07/2023:  Ambulation in clinic c SPC in Lt UE c SBA.   04/05/2023 FWW ambulation c reduced gait speed, step length bilateral, maintained knee flexion in stance lacking TKE.  Hip circumduction in swing to clear foot due to flexion restrictions.  TODAY'S TREATMENT                                                      DATE   05/09/2023 Therex: Nustep Lvl 8 mins UE/LE Incline board 30 sec X 3 Step ups onto 4 inch step WB on Rt leg single hand assist on bar 2 x 15 c SBA Seated Rt leg quad set 15 sec hold x 8 Seated Rt leg quad set c SLR 2 x 5 Sit to stand to sit 22.5 inch table height x 10   Heel prop 5 mins with vaso and 2.5 lb weight in elevation  Vasopneumatic Device 10 mins Rt knee medium compression 34 deg in elevation with heel prop first 5 mins  TODAY'S TREATMENT                                                      DATE  05/07/2023 Therex: UBE seat #10 UE/LE for ROM 8 mins  full circles with UE assist.  Incline board 30 sec X 3 Standing Green band TKE 5 sec hold x 15  Step ups onto 4 inch step WB on Rt leg single hand assist on bar x 15 c SBA Leg press  Lt leg only 31 lbs x 20  Heel prop 4 mins c cues for 1-2 lbs at home for extra stretching   Gait Training [facility] training c verbal cues and demonstration for sequencing.  Performed household distances in clinic several times between therex activity.  26ft, 25 ft, 75 ft  Neuro Re-ed Alternating toe tapping 4 inch step x 10 bilateral with SBA to occasional min A  Modified tandem stance front foot elevated on 4 inch step 1 min x 1 bilateral c CGA to occasional min A  Vasopneumatic Device 10 mins Rt knee medium compression 34 deg in elevation with above exercises   TODAY'S TREATMENT                                                      DATE  05/02/2023 Therex: UBE seat #10 UE/LE for ROM 7 mins  partial revolutions.  Seated Rt leg quad set 5 sec x10 Seated Rt leg quad set c SLR off floor 3 x 5 (cues for home)  Manual Seated Rt knee flexion c mobilization c movement IR/distraction.  Prolonged stretching into flexion and extension overpressure.   Vasopneumatic Device 10 mins Rt knee medium compression 34 deg in elevation with above exercises   TODAY'S TREATMENT                                                      DATE  04/30/2023 Therex: UBE seat #11  UE/LE for ROM 6 mins  full circles with UE assist.  Incline board 30 sec X 3 Heel and toe raises X 15 bilat with UE  support Step ups onto 4 inch step, X 10 reps leading with Rt leg, with one UE support  Leg press double leg 68 lb 2 x 10  , Rt leg only  25 lbs 2 x 10  Supine quad set 5 sec hold x 10 Rt leg Supine heelslides with strap  knee flexion AAROM 10 sec X 10  Manual Seated Rt knee flexion c mobilization c movement IR/distraction.  Prolonged stretching into flexion and extension overpressure.   Vasopneumatic Device 10 mins Rt knee medium compression 34 deg in elevation with above exercises    PATIENT EDUCATION:  04/19/2023 Education details: HEP, POC, rationale for interventions  Person educated: Patient Education method: Explanation, Demonstration, Verbal cues, and Handouts Education comprehension: verbalized understanding, returned demonstration, and verbal cues required   HOME EXERCISE PROGRAM: Access Code: [email or web address] URL: [email or web address] Date: 04/05/2023 Prepared by: Chyrel Masson  Exercises - Supine Heel Slide  - 3-5 x daily - 7 x weekly - 1 sets - 10 reps - 5 hold - Seated Long Arc Quad  - 3-5 x daily - 7 x weekly - 1 sets - 5-10 reps - 2 hold - Seated Knee Flexion Extension AAROM with Overpressure  - 3-5 x daily - 7 x weekly - 1 sets - 5 reps - 10 hold - Supine Knee Extension Mobilization with Weight  - 3-5 x daily - 7 x weekly - 1 sets - 1 reps - to tolerance up to 15 mins hold - Seated Quad Set  - 3-5 x daily - 7 x weekly - 1 sets - 10 reps - 5 hold   ASSESSMENT:   CLINICAL IMPRESSION: SPC use in clinic with supervision /SBA mostly with improving stability noted slowly over time.  Continued focus with extension based intervention (quad strengthening, passive long duration stretching).  Continued skilled PT services indicated at this time.     OBJECTIVE IMPAIRMENTS: Abnormal gait, decreased activity tolerance, decreased balance, decreased  coordination, decreased endurance, decreased mobility, difficulty walking, decreased ROM, decreased strength, hypomobility, increased edema, increased fascial restrictions, impaired perceived functional ability, increased muscle spasms, impaired flexibility, improper body mechanics, and pain.    ACTIVITY LIMITATIONS: carrying, lifting, bending, sitting, standing, squatting, sleeping, stairs, transfers, bed mobility, and locomotion level   PARTICIPATION LIMITATIONS: meal prep, cleaning, laundry, interpersonal relationship, shopping, and community activity   PERSONAL FACTORS: Age and HTN, OA, osteopenia, history of restless leg syndrome are also affecting patient's functional outcome.    REHAB POTENTIAL: Good   CLINICAL DECISION MAKING: Stable/uncomplicated   EVALUATION COMPLEXITY: Low     GOALS: Goals reviewed with patient? Yes   SHORT TERM GOALS: (target date for Short term goals are 3 weeks 04/26/2023)    1.  Patient will demonstrate independent use of home exercise program to maintain progress from in clinic treatments.   Goal status: On going 04/30/2023   LONG TERM GOALS: (target dates for all long term goals are 12 weeks  06/28/2023 )   1. Patient will demonstrate/report pain at worst less than or equal to 2/10 to facilitate minimal limitation in daily activity secondary to pain symptoms.   Goal status: On going 04/30/2023   2. Patient will demonstrate independent use of home exercise program to facilitate ability to maintain/progress functional gains from skilled physical therapy services.   Goal status: On going 04/30/2023   3. Patient will demonstrate FOTO outcome > or = 45 % to indicate reduced disability due to condition.   Goal status: On going 04/19/2023   4.  Patient will demonstrate Rt LE MMT 5/5 throughout to faciltiate usual transfers, stairs, squatting at [facility] for daily life.    Goal status: On going 04/30/2023   5.  Patient will demonstrate Rt knee AROM 0-110  degrees to facilitate mobility for transfers, ambulation, and other daily activity at PLOF.  Goal status: On going 04/30/2023   6.  Patient will demonstrate independent ambulation community distances > 500 ft for community integration.  Goal status: On going 04/19/2023   7.  Patient will demonstrate ascending/descending stairs reciprocally with one hand UE assist for community integration.    Goal Status: On going 04/19/2023     PLAN:   PT FREQUENCY: 1-2x/week   PT DURATION: 12 weeks   PLANNED INTERVENTIONS: Therapeutic exercises, Therapeutic activity, Neuro Muscular re-education, Balance training, Gait training, Patient/Family education, Joint mobilization, Stair training, DME instructions, Dry Needling, Electrical stimulation, Traction, Cryotherapy, vasopneumatic deviceMoist heat, Taping, Ultrasound, Ionotophoresis 4mg /ml Dexamethasone, and aquatic therapy, Manual therapy.  All included unless contraindicated   PLAN FOR NEXT SESSION: Extension gains , quad strengthening.   Chyrel Masson, PT, DPT, OCS, ATC 05/09/23  3:09 PM       Referring diagnosis? M17.11 (ICD-10-CM) - Primary osteoarthritis of right knee Treatment diagnosis? (if different than referring diagnosis) M25.561 What was this (referring dx) caused by? [x]  Surgery []  Fall []  Ongoing issue []  Arthritis []  Other: ____________  Laterality: [x]  Rt []  Lt []  Both  Check all possible CPT codes:  *CHOOSE 10 OR LESS*    []  97110 (Therapeutic Exercise)  []  92507 (SLP Treatment)  []  97112 (Neuro Re-ed)   []  92526 (Swallowing Treatment)   []  86578 (Gait Training)   []  [id number] (Cognitive Training, 1st 15 minutes) []  97140 (Manual Therapy)   []  97130 (Cognitive Training, each add'l 15 minutes)  []  97164 (Re-evaluation)                              []  Other, List CPT Code ____________  []  97530 (Therapeutic Activities)     []  97535 (Self Care)   [x]  All codes above (97110 - 97535)  []  97012 (Mechanical Traction)  []  97014  (E-stim Unattended)  [x]  97032 (E-stim manual)  []  97033 (Ionto)  []  97035 (Ultrasound) [x]  97750 (Physical Performance Training) []  [id number] (Aquatic Therapy) [x]  97016 (Vasopneumatic Device) []  [id number] (Paraffin) []  97034 (Contrast Bath) []  97597 (Wound Care 1st 20 sq cm) []  97598 (Wound Care each add'l 20 sq cm) []  97760 (Orthotic Fabrication, Fitting, Training Initial) []  [id number] (Prosthetic Management and Training Initial) []  [id number] (Orthotic or Prosthetic Training/ Modification Subsequent)

## 2023-05-09 NOTE — Telephone Encounter (Signed)
Patient asked for pain medication and muscle relaxer refill before the weekend. Thank you.

## 2023-05-15 ENCOUNTER — Encounter: Payer: Self-pay | Admitting: Rehabilitative and Restorative Service Providers"

## 2023-05-15 ENCOUNTER — Ambulatory Visit: Payer: Medicare PPO | Admitting: Rehabilitative and Restorative Service Providers"

## 2023-05-15 DIAGNOSIS — G8929 Other chronic pain: Secondary | ICD-10-CM

## 2023-05-15 DIAGNOSIS — M25561 Pain in right knee: Secondary | ICD-10-CM | POA: Diagnosis not present

## 2023-05-15 DIAGNOSIS — R600 Localized edema: Secondary | ICD-10-CM

## 2023-05-15 DIAGNOSIS — M6281 Muscle weakness (generalized): Secondary | ICD-10-CM | POA: Diagnosis not present

## 2023-05-15 DIAGNOSIS — R262 Difficulty in walking, not elsewhere classified: Secondary | ICD-10-CM | POA: Diagnosis not present

## 2023-05-15 NOTE — Therapy (Signed)
OUTPATIENT PHYSICAL THERAPY TREATMENT  Patient Name: [name] [name] MRN: [id number] DOB:[birth year], 89 y.o., female Today's Date: 05/15/2023  END OF SESSION:  PT End of Session - 05/15/23 1606     Visit Number 12    Number of Visits 24    Date for PT Re-Evaluation 06/28/23    Authorization Type [employer] $20 copay    Authorization Time Period 12 visits until 8/15    Authorization - Visit Number 12    Authorization - Number of Visits 12    Progress Note Due on Visit 17    PT Start Time 1555    PT Stop Time 1641    PT Time Calculation (min) 46 min    Activity Tolerance Patient tolerated treatment well    Behavior During Therapy [facility] for tasks assessed/performed              Past Medical History:  Diagnosis Date   Anemia    Anxiety and depression 07/16/2009   ASD (atrial septal defect) per bubble study, 2004    Cardiomyopathy, normal cath 2007, normal EF per ECHO 2014    Hx of colonic polyps    Hypertension    Osteoarthritis    Osteopenia    Podagra 04/29/2014   RLS (restless legs syndrome) 07/10/2009   Past Surgical History:  Procedure Laterality Date   BACK SURGERY  2000   BREAST SURGERY     NEGATIVE BIOPSY   TONSILLECTOMY     TOTAL KNEE ARTHROPLASTY Right 03/22/2023   Procedure: RIGHT TOTAL KNEE ARTHROPLASTY;  Surgeon: Kathryne Hitch, MD;  Location: [facility];  Service: Orthopedics;  Laterality: Right;   Patient Active Problem List   Diagnosis Date Noted   Status post total right knee replacement 03/22/2023   Depression, recurrent (HCC) 02/20/2020   Prediabetes 01/26/2019   History of exercise intolerance 01/26/2019   HLD (hyperlipidemia) 04/07/2018   DJD (degenerative joint disease) 02/09/2010   PVCs (premature ventricular contractions) 07/20/2009   Anxiety and depression 07/16/2009   Cardiomyopathy (HCC) 04/23/2007   HTN (hypertension) 04/15/2007   Osteopenia 04/15/2007    PCP: Bary Leriche PA-C  REFERRING PROVIDER: Kathryne Hitch, MD  REFERRING DIAG: M17.11 (ICD-10-CM) - Primary osteoarthritis of right knee  THERAPY DIAG:  Chronic pain of right knee  Muscle weakness (generalized)  Difficulty in walking, not elsewhere classified  Localized edema  Rationale for Evaluation and Treatment: Rehabilitation  ONSET DATE: 03/22/2023 Rt TKA  SUBJECTIVE:    SUBJECTIVE STATEMENT: She indicated walking with cane more at house.  No pain upon arrival.     PERTINENT HISTORY: HTN, OA, osteopenia, history of restless leg syndrome   PAIN:  NPRS scale:  0/10 upon arrival.  Pain location: Rt knee  Pain description: "just pains every now and then that just hit"  Aggravating factors: static positioning Relieving factors: pain medicine, icing, movement    PRECAUTIONS: None   WEIGHT BEARING RESTRICTIONS: No   FALLS:  Has patient fallen in last 6 months? No   LIVING ENVIRONMENT: Lives with: lives with their family Lives in: House/apartment Stairs: flight of stairs to bedroom, rail on Lt going up.  Entry to house 3 stairs on Lt going up.  Has following equipment at home: FWW, SPC    OCCUPATION: Retired   PLOF: Independent, reading, meditation, watch tv.   Went to church, retirement groups   PATIENT GOALS: Return to activity, reduce pain   Next MD visit:  approx 4 weeks from 04/05/2023 MD visit.  OBJECTIVE: (objective measures completed at initial evaluation unless otherwise dated)    PATIENT SURVEYS:  04/05/2023 FOTO intake: 23   predicted:  45   COGNITION: 04/05/2023 Overall cognitive status: WFL                        SENSATION: 04/05/2023 Not tested   EDEMA:  04/05/2023 Localized edema in Rt knee into lower leg.    MUSCLE LENGTH: 04/05/2023 No specific measurements   POSTURE:  04/05/2023:  forward trunk lean, rounded shoulders.    PALPATION: 04/05/2023 General tenderness around Rt knee, incision, guarding in Rt quad   LOWER EXTREMITY ROM:    ROM Right 04/05/2023 Right  04/12/23 Right 04/16/23 Right 04/23/2023 Right 04/30/23 Right   Hip flexion          Hip extension          Hip abduction          Hip adduction          Hip internal rotation          Hip external rotation          Knee flexion 56 AROM in supine heel slide  63* AAROM on Nustep with BUEs, 68* AROM seated at edge of mat table with knee flexion stretch 75 deg AAROM (strap + towel) seated Supine AROM heel slide 65  PROM heel slide 71  Pain limited  PROM  Seated 90 deg   Knee extension -18 AROM in seated LAQ    -17* in knee extension stretch PROM seated   Seated AROM LAQ: -15  Supine AROM quad set -10   PROM  Supine -5   Ankle dorsiflexion          Ankle plantarflexion          Ankle inversion          Ankle eversion          04/05/2023:  pain noted in end ranges both direction Rt knee   (Blank rows = not tested)   LOWER EXTREMITY MMT:   MMT Right 04/05/2023 Left 04/05/2023 Right   Hip flexion  3+/5  5/5   Hip extension       Hip abduction       Hip adduction       Hip internal rotation       Hip external rotation       Knee flexion 3+/5   5/5   Knee extension  2+/5  5/5   Ankle dorsiflexion  4/5 5/5    Ankle plantarflexion       Ankle inversion       Ankle eversion        (Blank rows = not tested)   LOWER EXTREMITY SPECIAL TESTS:  04/05/2023 No specific testing   FUNCTIONAL TESTS:  04/05/2023 18 inch chair transfer:  UE required, deviation to Lt leg WB  Rt SLS: unable unassisted   GAIT: 05/15/2023:  Ambulation in clinic with [facility] use in Lt UE  05/07/2023:  Ambulation in clinic c SPC in Lt UE c SBA.   04/05/2023 FWW ambulation c reduced gait speed, step length bilateral, maintained knee flexion in stance lacking TKE.  Hip circumduction in swing to clear foot due to flexion restrictions.  TODAY'S  TREATMENT                                                      DATE  05/15/2023 Therex: UBE LE only , seat 11 lvl 2.0 10 mins  Incline board 30 sec X 3 Step ups onto 6 inch step WB on Rt leg hand assist on bar  x 15 c SBA Leg press  Rt leg only 31 lbs x 20  Seated Rt leg quad set c SLR 2 x 10 Heel prop 5 mins with vaso   Neuro Re-ed Alt toe tapping 4 inch step x 15 bilateral with CGA to min A at times Modified tandem stance with front leg elevated on 6 inch step 1 min trial, performed bilaterally with SBA to min A at times  Vasopneumatic Device 10 mins Rt knee medium compression 34 deg in elevation with heel prop first 5 mins  TODAY'S TREATMENT                                                      DATE  05/09/2023 Therex: Nustep Lvl 8 mins UE/LE Incline board 30 sec X 3 Step ups onto 4 inch step WB on Rt leg single hand assist on bar 2 x 15 c SBA Seated Rt leg quad set 15 sec hold x 8 Seated Rt leg quad set c SLR 2 x 5 Sit to stand to sit 22.5 inch table height x 10   Heel prop 5 mins with vaso and 2.5 lb weight in elevation  Vasopneumatic Device 10 mins Rt knee medium compression 34 deg in elevation with heel prop first 5 mins  TODAY'S TREATMENT                                                      DATE  05/07/2023 Therex: UBE seat #10 UE/LE for ROM 8 mins  full circles with UE assist.  Incline board 30 sec X 3 Standing Green band TKE 5 sec hold x 15  Step ups onto 4 inch step WB on Rt leg single hand assist on bar x 15 c SBA Leg press  Rt leg only 31 lbs x 20  Heel prop 4 mins c cues for 1-2 lbs at home for extra stretching   Gait Training [facility] training c verbal cues and demonstration for sequencing.  Performed household distances in clinic several times between therex activity.  58ft, 25 ft, 75 ft  Neuro Re-ed Alternating toe tapping 4 inch step x 10 bilateral with SBA to occasional min A  Modified tandem stance front foot elevated on 4 inch step 1 min x 1 bilateral c CGA to  occasional min A  Vasopneumatic Device 10 mins Rt knee medium compression 34 deg in elevation with above exercises    PATIENT EDUCATION:  04/19/2023 Education details: HEP, POC, rationale for interventions  Person educated: Patient Education method: Explanation, Demonstration, Verbal cues, and Handouts Education comprehension: verbalized understanding, returned demonstration, and verbal cues required  HOME EXERCISE PROGRAM: Access Code: [email or web address] URL: [email or web address] Date: 04/05/2023 Prepared by: Chyrel Masson  Exercises - Supine Heel Slide  - 3-5 x daily - 7 x weekly - 1 sets - 10 reps - 5 hold - Seated Long Arc Quad  - 3-5 x daily - 7 x weekly - 1 sets - 5-10 reps - 2 hold - Seated Knee Flexion Extension AAROM with Overpressure  - 3-5 x daily - 7 x weekly - 1 sets - 5 reps - 10 hold - Supine Knee Extension Mobilization with Weight  - 3-5 x daily - 7 x weekly - 1 sets - 1 reps - to tolerance up to 15 mins hold - Seated Quad Set  - 3-5 x daily - 7 x weekly - 1 sets - 10 reps - 5 hold   ASSESSMENT:   CLINICAL IMPRESSION: SPC upon arrival today and in clinic c supervision to SBA with fatigue in visit.  Continued focus on improving overall Rt knee mobility and strength to continue to progress functional activity tolerance including walking distance, speed and stairs. Continued skilled PT services indicated at this time.     OBJECTIVE IMPAIRMENTS: Abnormal gait, decreased activity tolerance, decreased balance, decreased coordination, decreased endurance, decreased mobility, difficulty walking, decreased ROM, decreased strength, hypomobility, increased edema, increased fascial restrictions, impaired perceived functional ability, increased muscle spasms, impaired flexibility, improper body mechanics, and pain.    ACTIVITY LIMITATIONS: carrying, lifting, bending, sitting, standing, squatting, sleeping, stairs, transfers, bed mobility, and locomotion level    PARTICIPATION LIMITATIONS: meal prep, cleaning, laundry, interpersonal relationship, shopping, and community activity   PERSONAL FACTORS: Age and HTN, OA, osteopenia, history of restless leg syndrome are also affecting patient's functional outcome.    REHAB POTENTIAL: Good   CLINICAL DECISION MAKING: Stable/uncomplicated   EVALUATION COMPLEXITY: Low     GOALS: Goals reviewed with patient? Yes   SHORT TERM GOALS: (target date for Short term goals are 3 weeks 04/26/2023)    1.  Patient will demonstrate independent use of home exercise program to maintain progress from in clinic treatments.   Goal status: On going 04/30/2023   LONG TERM GOALS: (target dates for all long term goals are 12 weeks  06/28/2023 )   1. Patient will demonstrate/report pain at worst less than or equal to 2/10 to facilitate minimal limitation in daily activity secondary to pain symptoms.   Goal status: On going 04/30/2023   2. Patient will demonstrate independent use of home exercise program to facilitate ability to maintain/progress functional gains from skilled physical therapy services.   Goal status: On going 04/30/2023   3. Patient will demonstrate FOTO outcome > or = 45 % to indicate reduced disability due to condition.   Goal status: On going 04/19/2023   4.  Patient will demonstrate Rt LE MMT 5/5 throughout to faciltiate usual transfers, stairs, squatting at [facility] for daily life.    Goal status: On going 04/30/2023   5.  Patient will demonstrate Rt knee AROM 0-110 degrees to facilitate mobility for transfers, ambulation, and other daily activity at PLOF.  Goal status: On going 04/30/2023   6.  Patient will demonstrate independent ambulation community distances > 500 ft for community integration.  Goal status: On going 04/19/2023   7.  Patient will demonstrate ascending/descending stairs reciprocally with one hand UE assist for community integration.    Goal Status: On going 04/19/2023     PLAN:   PT  FREQUENCY: 1-2x/week   PT DURATION: 12 weeks  PLANNED INTERVENTIONS: Therapeutic exercises, Therapeutic activity, Neuro Muscular re-education, Balance training, Gait training, Patient/Family education, Joint mobilization, Stair training, DME instructions, Dry Needling, Electrical stimulation, Traction, Cryotherapy, vasopneumatic deviceMoist heat, Taping, Ultrasound, Ionotophoresis 4mg /ml Dexamethasone, and aquatic therapy, Manual therapy.  All included unless contraindicated   PLAN FOR NEXT SESSION: [employer] recert tomorrow, progress note.   Chyrel Masson, PT, DPT, OCS, ATC 05/15/23  4:35 PM       Referring diagnosis? M17.11 (ICD-10-CM) - Primary osteoarthritis of right knee Treatment diagnosis? (if different than referring diagnosis) M25.561 What was this (referring dx) caused by? [x]  Surgery []  Fall []  Ongoing issue []  Arthritis []  Other: ____________  Laterality: [x]  Rt []  Lt []  Both  Check all possible CPT codes:  *CHOOSE 10 OR LESS*    []  97110 (Therapeutic Exercise)  []  92507 (SLP Treatment)  []  97112 (Neuro Re-ed)   []  92526 (Swallowing Treatment)   []  97116 (Gait Training)   []  [id number] (Cognitive Training, 1st 15 minutes) []  97140 (Manual Therapy)   []  97130 (Cognitive Training, each add'l 15 minutes)  []  97164 (Re-evaluation)                              []  Other, List CPT Code ____________  []  97530 (Therapeutic Activities)     []  97535 (Self Care)   [x]  All codes above (97110 - 97535)  []  97012 (Mechanical Traction)  []  97014 (E-stim Unattended)  [x]  97032 (E-stim manual)  []  97033 (Ionto)  []  97035 (Ultrasound) [x]  97750 (Physical Performance Training) []  [id number] (Aquatic Therapy) [x]  97016 (Vasopneumatic Device) []  [id number] (Paraffin) []  97034 (Contrast Bath) []  97597 (Wound Care 1st 20 sq cm) []  97598 (Wound Care each add'l 20 sq cm) []  97760 (Orthotic Fabrication, Fitting, Training Initial) []  [id number] (Prosthetic Management and Training Initial) []  [id number]  (Orthotic or Prosthetic Training/ Modification Subsequent)

## 2023-05-16 ENCOUNTER — Encounter: Payer: Self-pay | Admitting: Rehabilitative and Restorative Service Providers"

## 2023-05-16 ENCOUNTER — Ambulatory Visit: Payer: Medicare PPO | Admitting: Rehabilitative and Restorative Service Providers"

## 2023-05-16 DIAGNOSIS — M6281 Muscle weakness (generalized): Secondary | ICD-10-CM

## 2023-05-16 DIAGNOSIS — R262 Difficulty in walking, not elsewhere classified: Secondary | ICD-10-CM

## 2023-05-16 DIAGNOSIS — G8929 Other chronic pain: Secondary | ICD-10-CM | POA: Diagnosis not present

## 2023-05-16 DIAGNOSIS — M25561 Pain in right knee: Secondary | ICD-10-CM

## 2023-05-16 DIAGNOSIS — R600 Localized edema: Secondary | ICD-10-CM | POA: Diagnosis not present

## 2023-05-16 NOTE — Therapy (Signed)
OUTPATIENT PHYSICAL THERAPY TREATMENT / [employer]  Patient Name: [name] [name] MRN: [id number] DOB:12/27/1926, 89 y.o., female Today's Date: 05/16/2023  Progress Note Reporting Period 04/30/2023 to 05/16/2023  See note below for Objective Data and Assessment of Progress/Goals.   END OF SESSION:  PT End of Session - 05/16/23 1604     Visit Number 13    Number of Visits 24    Date for PT Re-Evaluation 06/28/23    Authorization Type [employer] $20 copay    Authorization Time Period 12 visits until 8/15    Authorization - Visit Number 1    Authorization - Number of Visits 12    Progress Note Due on Visit 24    PT Start Time 1559    PT Stop Time 1645    PT Time Calculation (min) 46 min    Activity Tolerance Patient tolerated treatment well    Behavior During Therapy [facility] for tasks assessed/performed               Past Medical History:  Diagnosis Date   Anemia    Anxiety and depression 07/16/2009   ASD (atrial septal defect) per bubble study, 2004    Cardiomyopathy, normal cath 2007, normal EF per ECHO 2014    Hx of colonic polyps    Hypertension    Osteoarthritis    Osteopenia    Podagra 04/29/2014   RLS (restless legs syndrome) 07/10/2009   Past Surgical History:  Procedure Laterality Date   BACK SURGERY  2000   BREAST SURGERY     NEGATIVE BIOPSY   TONSILLECTOMY     TOTAL KNEE ARTHROPLASTY Right 03/22/2023   Procedure: RIGHT TOTAL KNEE ARTHROPLASTY;  Surgeon: Kathryne Hitch, MD;  Location: [facility];  Service: Orthopedics;  Laterality: Right;   Patient Active Problem List   Diagnosis Date Noted   Status post total right knee replacement 03/22/2023   Depression, recurrent (HCC) 02/20/2020   Prediabetes 01/26/2019   History of exercise intolerance 01/26/2019   HLD (hyperlipidemia) 04/07/2018   DJD (degenerative joint disease) 02/09/2010   PVCs (premature ventricular contractions) 07/20/2009   Anxiety and depression 07/16/2009   Cardiomyopathy (HCC)  04/23/2007   HTN (hypertension) 04/15/2007   Osteopenia 04/15/2007    PCP: Bary Leriche PA-C  REFERRING PROVIDER: Kathryne Hitch, MD  REFERRING DIAG: M17.11 (ICD-10-CM) - Primary osteoarthritis of right knee  THERAPY DIAG:  Chronic pain of right knee  Muscle weakness (generalized)  Difficulty in walking, not elsewhere classified  Localized edema  Rationale for Evaluation and Treatment: Rehabilitation  ONSET DATE: 03/22/2023 Rt TKA  SUBJECTIVE:    SUBJECTIVE STATEMENT: She indicated walking with cane more at house.  No pain upon arrival.     PERTINENT HISTORY: HTN, OA, osteopenia, history of restless leg syndrome   PAIN:  NPRS scale:  0/10 upon arrival.  Pain location: Rt knee  Pain description: "just pains every now and then that just hit"  Aggravating factors: static positioning Relieving factors: pain medicine, icing, movement    PRECAUTIONS: None   WEIGHT BEARING RESTRICTIONS: No   FALLS:  Has patient fallen in last 6 months? No   LIVING ENVIRONMENT: Lives with: lives with their family Lives in: House/apartment Stairs: flight of stairs to bedroom, rail on Lt going up.  Entry to house 3 stairs on Lt going up.  Has following equipment at home: FWW, SPC    OCCUPATION: Retired   PLOF: Independent, reading, meditation, watch tv.   Went to [employer], retirement  groups   PATIENT GOALS: Return to activity, reduce pain   Next MD visit:  approx 4 weeks from 04/05/2023 MD visit.      OBJECTIVE: (objective measures completed at initial evaluation unless otherwise dated)    PATIENT SURVEYS:  05/16/2023:  FOTO update:   Answered with help of daughter  04/05/2023 FOTO intake: 23   predicted:  45   COGNITION: 04/05/2023 Overall cognitive status: WFL                        SENSATION: 04/05/2023 Not tested   EDEMA:  04/05/2023 Localized edema in Rt knee into lower leg.    MUSCLE LENGTH: 04/05/2023 No specific measurements   POSTURE:   04/05/2023:  forward trunk lean, rounded shoulders.    PALPATION: 04/05/2023 General tenderness around Rt knee, incision, guarding in Rt quad   LOWER EXTREMITY ROM:    ROM Right 04/05/2023 Right 04/12/23 Right 04/16/23 Right 04/23/2023 Right 04/30/23 Right 05/16/2023  Hip flexion          Hip extension          Hip abduction          Hip adduction          Hip internal rotation          Hip external rotation          Knee flexion 56 AROM in supine heel slide  63* AAROM on Nustep with BUEs, 68* AROM seated at edge of mat table with knee flexion stretch 75 deg AAROM (strap + towel) seated Supine AROM heel slide 65  PROM heel slide 71  Pain limited  PROM  Seated 90 deg Supine heel slide AROM 85  Supine heel slide AROM 90  Knee extension -18 AROM in seated LAQ    -17* in knee extension stretch PROM seated   Seated AROM LAQ: -15  Supine AROM quad set -10   PROM  Supine -5 Seated LAQ -12  Supine quad set AROM -6  Ankle dorsiflexion          Ankle plantarflexion          Ankle inversion          Ankle eversion          04/05/2023:  pain noted in end ranges both direction Rt knee   (Blank rows = not tested)   LOWER EXTREMITY MMT:   MMT Right 04/05/2023 Left 04/05/2023 Right 05/16/2023  Hip flexion  3+/5  5/5   Hip extension       Hip abduction       Hip adduction       Hip internal rotation       Hip external rotation       Knee flexion 3+/5   5/5 4/5  Knee extension  2+/5  5/5 4/5 in available range  Ankle dorsiflexion  4/5 5/5    Ankle plantarflexion       Ankle inversion       Ankle eversion        (Blank rows = not tested)   LOWER EXTREMITY SPECIAL TESTS:  04/05/2023 No specific testing   FUNCTIONAL TESTS:  05/16/2023:  able to perform   04/05/2023 18 inch chair transfer:  UE required, deviation to Lt leg WB  Rt SLS: unable unassisted   GAIT: 05/15/2023:  Ambulation in clinic with [facility] use in Lt UE  05/07/2023:  Ambulation in clinic c SPC in Lt UE c  SBA.    04/05/2023 FWW ambulation c reduced gait speed, step length bilateral, maintained knee flexion in stance lacking TKE.  Hip circumduction in swing to clear foot due to flexion restrictions.                                                                                                                                                                      TODAY'S TREATMENT                                                      DATE  05/16/2023 Therex: UBE LE only , seat 11 lvl 2.0 8 mins  Seated Rt leg LAQ 3 lbs 2 sec pause end ranges 2 x 15 Sit to stand to sit 24 inch table x 15 no UE assist with assist from clinician tactile to prevent Lt leg movement back.  Seated Rt knee flexion c Lt leg overpressure 15 sec x 5  Manual Seated Rt knee flexion c distraction/IR mobilization c movement.  Contract relax for Rt knee flexion.  Prolonged end range stretching.   Vasopneumatic Device 10 mins Rt knee medium compression 34 deg in elevation with heel prop first 5 mins  TODAY'S TREATMENT                                                      DATE  05/15/2023 Therex: UBE LE only , seat 11 lvl 2.0 10 mins  Incline board 30 sec X 3 Step ups onto 6 inch step WB on Rt leg hand assist on bar  x 15 c SBA Leg press  Rt leg only 31 lbs x 20  Seated Rt leg quad set c SLR 2 x 10 Heel prop 5 mins with vaso   Neuro Re-ed Alt toe tapping 4 inch step x 15 bilateral with CGA to min A at times Modified tandem stance with front leg elevated on 6 inch step 1 min trial, performed bilaterally with SBA to min A at times  Vasopneumatic Device 10 mins Rt knee medium compression 34 deg in elevation with heel prop first 5 mins  TODAY'S TREATMENT  DATE  05/09/2023 Therex: Nustep Lvl 8 mins UE/LE Incline board 30 sec X 3 Step ups onto 4 inch step WB on Rt leg single hand assist on bar 2 x 15 c SBA Seated Rt leg quad set 15 sec hold x 8 Seated Rt leg quad set c SLR 2 x  5 Sit to stand to sit 22.5 inch table height x 10   Heel prop 5 mins with vaso and 2.5 lb weight in elevation  Vasopneumatic Device 10 mins Rt knee medium compression 34 deg in elevation with heel prop first 5 mins   PATIENT EDUCATION:  04/19/2023 Education details: HEP, POC, rationale for interventions  Person educated: Patient Education method: Explanation, Demonstration, Verbal cues, and Handouts Education comprehension: verbalized understanding, returned demonstration, and verbal cues required   HOME EXERCISE PROGRAM: Access Code: [email or web address] URL: [email or web address] Date: 04/05/2023 Prepared by: Chyrel Masson  Exercises - Supine Heel Slide  - 3-5 x daily - 7 x weekly - 1 sets - 10 reps - 5 hold - Seated Long Arc Quad  - 3-5 x daily - 7 x weekly - 1 sets - 5-10 reps - 2 hold - Seated Knee Flexion Extension AAROM with Overpressure  - 3-5 x daily - 7 x weekly - 1 sets - 5 reps - 10 hold - Supine Knee Extension Mobilization with Weight  - 3-5 x daily - 7 x weekly - 1 sets - 1 reps - to tolerance up to 15 mins hold - Seated Quad Set  - 3-5 x daily - 7 x weekly - 1 sets - 10 reps - 5 hold   ASSESSMENT:   CLINICAL IMPRESSION: The patient has attended 13 visits over the course of treatment cycle.  See objective data above for updated information regarding current presentation.  Slow but steady gains have been noted at this time but impairments still noted as documented. Continued skilled PT services warranted at this time to continue progression of independence in gait, improve knee mobility and strength for functional movement improvements.        OBJECTIVE IMPAIRMENTS: Abnormal gait, decreased activity tolerance, decreased balance, decreased coordination, decreased endurance, decreased mobility, difficulty walking, decreased ROM, decreased strength, hypomobility, increased edema, increased fascial restrictions, impaired perceived functional ability, increased muscle  spasms, impaired flexibility, improper body mechanics, and pain.    ACTIVITY LIMITATIONS: carrying, lifting, bending, sitting, standing, squatting, sleeping, stairs, transfers, bed mobility, and locomotion level   PARTICIPATION LIMITATIONS: meal prep, cleaning, laundry, interpersonal relationship, shopping, and community activity   PERSONAL FACTORS: Age and HTN, OA, osteopenia, history of restless leg syndrome are also affecting patient's functional outcome.    REHAB POTENTIAL: Good   CLINICAL DECISION MAKING: Stable/uncomplicated   EVALUATION COMPLEXITY: Low     GOALS: Goals reviewed with patient? Yes   SHORT TERM GOALS: (target date for Short term goals are 3 weeks 04/26/2023)    1.  Patient will demonstrate independent use of home exercise program to maintain progress from in clinic treatments.   Goal status: On going 04/30/2023   LONG TERM GOALS: (target dates for all long term goals are 12 weeks  06/28/2023 )   1. Patient will demonstrate/report pain at worst less than or equal to 2/10 to facilitate minimal limitation in daily activity secondary to pain symptoms.   Goal status: On going 05/16/2023   2. Patient will demonstrate independent use of home exercise program to facilitate ability to maintain/progress functional gains from skilled physical therapy services.   Goal status: On going  05/16/2023   3. Patient will demonstrate FOTO outcome > or = 45 % to indicate reduced disability due to condition.   Goal status: On going 05/16/2023   4.  Patient will demonstrate Rt LE MMT 5/5 throughout to faciltiate usual transfers, stairs, squatting at [facility] for daily life.    Goal status: On going 05/16/2023   5.  Patient will demonstrate Rt knee AROM 0-110 degrees to facilitate mobility for transfers, ambulation, and other daily activity at PLOF.  Goal status: On going 05/16/2023   6.  Patient will demonstrate independent ambulation community distances > 500 ft for community integration.   Goal status: On going 05/16/2023   7.  Patient will demonstrate ascending/descending stairs reciprocally with one hand UE assist for community integration.    Goal Status: On going 05/16/2023     PLAN:   PT FREQUENCY: 1-2x/week   PT DURATION: 12 weeks   PLANNED INTERVENTIONS: Therapeutic exercises, Therapeutic activity, Neuro Muscular re-education, Balance training, Gait training, Patient/Family education, Joint mobilization, Stair training, DME instructions, Dry Needling, Electrical stimulation, Traction, Cryotherapy, vasopneumatic deviceMoist heat, Taping, Ultrasound, Ionotophoresis 4mg /ml Dexamethasone, and aquatic therapy, Manual therapy.  All included unless contraindicated   PLAN FOR NEXT SESSION: Follow up from MD visit.   Continue to progress functional strength, try stairs.   Chyrel Masson, PT, DPT, OCS, ATC 05/16/23  4:46 PM     Asking for additional 12 visits, through 06/28/2023  Referring diagnosis? M17.11 (ICD-10-CM) - Primary osteoarthritis of right knee Treatment diagnosis? (if different than referring diagnosis) M25.561 What was this (referring dx) caused by? [x]  Surgery []  Fall []  Ongoing issue []  Arthritis []  Other: ____________  Laterality: [x]  Rt []  Lt []  Both  Check all possible CPT codes:  *CHOOSE 10 OR LESS*    []  97110 (Therapeutic Exercise)  []  92507 (SLP Treatment)  []  97112 (Neuro Re-ed)   []  92526 (Swallowing Treatment)   []  97116 (Gait Training)   []  [id number] (Cognitive Training, 1st 15 minutes) []  97140 (Manual Therapy)   []  97130 (Cognitive Training, each add'l 15 minutes)  []  97164 (Re-evaluation)                              []  Other, List CPT Code ____________  []  09811 (Therapeutic Activities)     []  97535 (Self Care)   [x]  All codes above (97110 - 97535)  []  97012 (Mechanical Traction)  []  97014 (E-stim Unattended)  [x]  97032 (E-stim manual)  []  97033 (Ionto)  []  97035 (Ultrasound) [x]  97750 (Physical Performance Training) []  [id number]  (Aquatic Therapy) [x]  97016 (Vasopneumatic Device) []  [id number] (Paraffin) []  97034 (Contrast Bath) []  97597 (Wound Care 1st 20 sq cm) []  97598 (Wound Care each add'l 20 sq cm) []  97760 (Orthotic Fabrication, Fitting, Training Initial) []  [id number] (Prosthetic Management and Training Initial) []  [id number] (Orthotic or Prosthetic Training/ Modification Subsequent)

## 2023-05-21 ENCOUNTER — Encounter: Payer: Self-pay | Admitting: Physician Assistant

## 2023-05-21 ENCOUNTER — Other Ambulatory Visit: Payer: Self-pay

## 2023-05-21 ENCOUNTER — Ambulatory Visit (INDEPENDENT_AMBULATORY_CARE_PROVIDER_SITE_OTHER): Payer: Medicare PPO | Admitting: Physician Assistant

## 2023-05-21 ENCOUNTER — Encounter: Payer: Medicare PPO | Admitting: Rehabilitative and Restorative Service Providers"

## 2023-05-21 DIAGNOSIS — Z96651 Presence of right artificial knee joint: Secondary | ICD-10-CM

## 2023-05-21 NOTE — Progress Notes (Signed)
HPI: [name] [name] comes in today status post right total knee arthroplasty 03/22/2023.  She states she has no pain in the knee.  She has been working hard with therapy to work on range of motion.  She was only 60 degrees of flexion at last visit.  She feels therapy has been beneficial.  Physical exam: General well-developed well-nourished female no acute distress ambulates with a cane. Right knee full extension flexion to 90 degrees.  No instability valgus varus stressing.  Surgical incisions healing well no signs of infection.  Radiographs:Right knee 2 views: Status post right total knee arthroplasty well-seated components.  Knee is well located.  No acute fractures or acute findings.  Impression: Status post right total knee arthroplasty  Plan: Recommend she continue to work on range of motion and strengthening the knee.  Will see her back in 1 month.  She does not wish to proceed with any type of surgical intervention on the right knee manipulation at this point in time.

## 2023-05-23 ENCOUNTER — Encounter: Payer: Self-pay | Admitting: Rehabilitative and Restorative Service Providers"

## 2023-05-23 ENCOUNTER — Ambulatory Visit: Payer: Medicare PPO | Admitting: Rehabilitative and Restorative Service Providers"

## 2023-05-23 DIAGNOSIS — R262 Difficulty in walking, not elsewhere classified: Secondary | ICD-10-CM

## 2023-05-23 DIAGNOSIS — M6281 Muscle weakness (generalized): Secondary | ICD-10-CM

## 2023-05-23 DIAGNOSIS — M25561 Pain in right knee: Secondary | ICD-10-CM

## 2023-05-23 DIAGNOSIS — R600 Localized edema: Secondary | ICD-10-CM | POA: Diagnosis not present

## 2023-05-23 DIAGNOSIS — G8929 Other chronic pain: Secondary | ICD-10-CM

## 2023-05-23 NOTE — Therapy (Signed)
OUTPATIENT PHYSICAL THERAPY TREATMENT  Patient Name: [name] [name] MRN: [id number] DOB:29-Jun-1927, 89 y.o., female Today's Date: 05/23/2023   END OF SESSION:  PT End of Session - 05/23/23 1523     Visit Number 14    Number of Visits 24    Date for PT Re-Evaluation 06/28/23    Authorization Type [employer] $20 copay    Authorization Time Period 12 visits until 8/15    Authorization - Visit Number 2    Authorization - Number of Visits 12    Progress Note Due on Visit 24    PT Start Time 1513    PT Stop Time 1602    PT Time Calculation (min) 49 min    Activity Tolerance Patient tolerated treatment well    Behavior During Therapy [facility] for tasks assessed/performed                Past Medical History:  Diagnosis Date   Anemia    Anxiety and depression 07/16/2009   ASD (atrial septal defect) per bubble study, 2004    Cardiomyopathy, normal cath 2007, normal EF per ECHO 2014    Hx of colonic polyps    Hypertension    Osteoarthritis    Osteopenia    Podagra 04/29/2014   RLS (restless legs syndrome) 07/10/2009   Past Surgical History:  Procedure Laterality Date   BACK SURGERY  2000   BREAST SURGERY     NEGATIVE BIOPSY   TONSILLECTOMY     TOTAL KNEE ARTHROPLASTY Right 03/22/2023   Procedure: RIGHT TOTAL KNEE ARTHROPLASTY;  Surgeon: Kathryne Hitch, MD;  Location: [facility];  Service: Orthopedics;  Laterality: Right;   Patient Active Problem List   Diagnosis Date Noted   Status post total right knee replacement 03/22/2023   Depression, recurrent (HCC) 02/20/2020   Prediabetes 01/26/2019   History of exercise intolerance 01/26/2019   HLD (hyperlipidemia) 04/07/2018   DJD (degenerative joint disease) 02/09/2010   PVCs (premature ventricular contractions) 07/20/2009   Anxiety and depression 07/16/2009   Cardiomyopathy (HCC) 04/23/2007   HTN (hypertension) 04/15/2007   Osteopenia 04/15/2007    PCP: Bary Leriche PA-C  REFERRING PROVIDER: Kathryne Hitch, MD  REFERRING DIAG: M17.11 (ICD-10-CM) - Primary osteoarthritis of right knee  THERAPY DIAG:  Chronic pain of right knee  Muscle weakness (generalized)  Difficulty in walking, not elsewhere classified  Localized edema  Rationale for Evaluation and Treatment: Rehabilitation  ONSET DATE: 03/22/2023 Rt TKA  SUBJECTIVE:    SUBJECTIVE STATEMENT: She stated no pain upon arrival, just tight.  Reported that they were going to [facility] to check on compression stocking above knee.    PERTINENT HISTORY: HTN, OA, osteopenia, history of restless leg syndrome   PAIN:  NPRS scale:  0/10 upon arrival.  Pain location: Rt knee  Pain description: "just pains every now and then that just hit"  Aggravating factors: static positioning Relieving factors: pain medicine, icing, movement    PRECAUTIONS: None   WEIGHT BEARING RESTRICTIONS: No   FALLS:  Has patient fallen in last 6 months? No   LIVING ENVIRONMENT: Lives with: lives with their family Lives in: House/apartment Stairs: flight of stairs to bedroom, rail on Lt going up.  Entry to house 3 stairs on Lt going up.  Has following equipment at home: FWW, SPC    OCCUPATION: Retired   PLOF: Independent, reading, meditation, watch tv.   Went to church, retirement groups   PATIENT GOALS: Return to activity, reduce pain  Next MD visit:  approx 4 weeks from 04/05/2023 MD visit.      OBJECTIVE: (objective measures completed at initial evaluation unless otherwise dated)    PATIENT SURVEYS:  05/16/2023:  FOTO update:  44 Answered with help of daughter  04/05/2023 FOTO intake: 23   predicted:  45   COGNITION: 04/05/2023 Overall cognitive status: WFL                        SENSATION: 04/05/2023 Not tested   EDEMA:  04/05/2023 Localized edema in Rt knee into lower leg.    MUSCLE LENGTH: 04/05/2023 No specific measurements   POSTURE:  04/05/2023:  forward trunk lean, rounded shoulders.     PALPATION: 04/05/2023 General tenderness around Rt knee, incision, guarding in Rt quad   LOWER EXTREMITY ROM:    ROM Right 04/05/2023 Right 04/12/23 Right 04/16/23 Right 04/23/2023 Right 04/30/23 Right 05/16/2023  Hip flexion          Hip extension          Hip abduction          Hip adduction          Hip internal rotation          Hip external rotation          Knee flexion 56 AROM in supine heel slide  63* AAROM on Nustep with BUEs, 68* AROM seated at edge of mat table with knee flexion stretch 75 deg AAROM (strap + towel) seated Supine AROM heel slide 65  PROM heel slide 71  Pain limited  PROM  Seated 90 deg Supine heel slide AROM 85  Supine heel slide AROM 90  Knee extension -18 AROM in seated LAQ    -17* in knee extension stretch PROM seated   Seated AROM LAQ: -15  Supine AROM quad set -10   PROM  Supine -5 Seated LAQ -12  Supine quad set AROM -6  Ankle dorsiflexion          Ankle plantarflexion          Ankle inversion          Ankle eversion          04/05/2023:  pain noted in end ranges both direction Rt knee   (Blank rows = not tested)   LOWER EXTREMITY MMT:   MMT Right 04/05/2023 Left 04/05/2023 Right 05/16/2023  Hip flexion  3+/5  5/5   Hip extension       Hip abduction       Hip adduction       Hip internal rotation       Hip external rotation       Knee flexion 3+/5   5/5 4/5  Knee extension  2+/5  5/5 4/5 in available range  Ankle dorsiflexion  4/5 5/5    Ankle plantarflexion       Ankle inversion       Ankle eversion        (Blank rows = not tested)   LOWER EXTREMITY SPECIAL TESTS:  04/05/2023 No specific testing   FUNCTIONAL TESTS:    04/05/2023 18 inch chair transfer:  UE required, deviation to Lt leg WB  Rt SLS: unable unassisted   GAIT: 05/15/2023:  Ambulation in clinic with [facility] use in Lt UE  05/07/2023:  Ambulation in clinic c SPC in Lt UE c SBA.   04/05/2023 FWW ambulation c reduced gait speed, step length bilateral, maintained  knee flexion  in stance lacking TKE.  Hip circumduction in swing to clear foot due to flexion restrictions.                                                                                                                                                                      TODAY'S TREATMENT                                                      DATE  05/23/2023 Therex: UBE LE only , seat 10 lvl 2.0 8 mins  Gastroc incline stretch 30 sec x 3 bilaterally  Step up WB on Rt leg 6 inch step with bilateral UE assist x 15 Step over and clear 6 inch hurdle and return with hand on bar x 10 bilateral Seated Rt knee flexion hold on floor 30 sec x 4  Leg press Rt leg only 31 lbs x 20  Heel prop stretch to tolerance at start of vaso time 5 mins   Vasopneumatic Device 10 mins Rt knee medium compression 34 deg in elevation with heel prop first 5 mins  TODAY'S TREATMENT                                                      DATE  05/16/2023 Therex: UBE LE only , seat 11 lvl 2.0 8 mins  Seated Rt leg LAQ 3 lbs 2 sec pause end ranges 2 x 15 Sit to stand to sit 24 inch table x 15 no UE assist with assist from clinician tactile to prevent Lt leg movement back.  Seated Rt knee flexion c Lt leg overpressure 15 sec x 5  Manual Seated Rt knee flexion c distraction/IR mobilization c movement.  Contract relax for Rt knee flexion.  Prolonged end range stretching.   Vasopneumatic Device 10 mins Rt knee medium compression 34 deg in elevation with heel prop first 5 mins  TODAY'S TREATMENT                                                      DATE  05/15/2023 Therex: UBE LE only , seat 11 lvl 2.0 10 mins  Incline board 30 sec X 3 Step ups onto 6 inch step WB on Rt leg hand assist on  bar  x 15 c SBA Leg press  Rt leg only 31 lbs x 20  Seated Rt leg quad set c SLR 2 x 10 Heel prop 5 mins with vaso   Neuro Re-ed Alt toe tapping 4 inch step x 15 bilateral with CGA to min A at times Modified tandem stance with front leg  elevated on 6 inch step 1 min trial, performed bilaterally with SBA to min A at times  Vasopneumatic Device 10 mins Rt knee medium compression 34 deg in elevation with heel prop first 5 mins   PATIENT EDUCATION:  04/19/2023 Education details: HEP, POC, rationale for interventions  Person educated: Patient Education method: Explanation, Demonstration, Verbal cues, and Handouts Education comprehension: verbalized understanding, returned demonstration, and verbal cues required   HOME EXERCISE PROGRAM: Access Code: [email or web address] URL: [email or web address] Date: 04/05/2023 Prepared by: Chyrel Masson  Exercises - Supine Heel Slide  - 3-5 x daily - 7 x weekly - 1 sets - 10 reps - 5 hold - Seated Long Arc Quad  - 3-5 x daily - 7 x weekly - 1 sets - 5-10 reps - 2 hold - Seated Knee Flexion Extension AAROM with Overpressure  - 3-5 x daily - 7 x weekly - 1 sets - 5 reps - 10 hold - Supine Knee Extension Mobilization with Weight  - 3-5 x daily - 7 x weekly - 1 sets - 1 reps - to tolerance up to 15 mins hold - Seated Quad Set  - 3-5 x daily - 7 x weekly - 1 sets - 10 reps - 5 hold   ASSESSMENT:   CLINICAL IMPRESSION: Visit with MD office was reported with continued skilled PT services recommended without manipulation procedure at this time.  Continued to progress WB strengthening and encouraged longer duration stretch to promote gains Rt knee flexion and extension.    OBJECTIVE IMPAIRMENTS: Abnormal gait, decreased activity tolerance, decreased balance, decreased coordination, decreased endurance, decreased mobility, difficulty walking, decreased ROM, decreased strength, hypomobility, increased edema, increased fascial restrictions, impaired perceived functional ability, increased muscle spasms, impaired flexibility, improper body mechanics, and pain.    ACTIVITY LIMITATIONS: carrying, lifting, bending, sitting, standing, squatting, sleeping, stairs, transfers, bed mobility, and  locomotion level   PARTICIPATION LIMITATIONS: meal prep, cleaning, laundry, interpersonal relationship, shopping, and community activity   PERSONAL FACTORS: Age and HTN, OA, osteopenia, history of restless leg syndrome are also affecting patient's functional outcome.    REHAB POTENTIAL: Good   CLINICAL DECISION MAKING: Stable/uncomplicated   EVALUATION COMPLEXITY: Low     GOALS: Goals reviewed with patient? Yes   SHORT TERM GOALS: (target date for Short term goals are 3 weeks 04/26/2023)    1.  Patient will demonstrate independent use of home exercise program to maintain progress from in clinic treatments.   Goal status: On going 04/30/2023   LONG TERM GOALS: (target dates for all long term goals are 12 weeks  06/28/2023 )   1. Patient will demonstrate/report pain at worst less than or equal to 2/10 to facilitate minimal limitation in daily activity secondary to pain symptoms.   Goal status: On going 05/16/2023   2. Patient will demonstrate independent use of home exercise program to facilitate ability to maintain/progress functional gains from skilled physical therapy services.   Goal status: On going 05/16/2023   3. Patient will demonstrate FOTO outcome > or = 45 % to indicate reduced disability due to condition.   Goal status: On going 05/16/2023   4.  Patient will demonstrate  Rt LE MMT 5/5 throughout to faciltiate usual transfers, stairs, squatting at [facility] for daily life.    Goal status: On going 05/16/2023   5.  Patient will demonstrate Rt knee AROM 0-110 degrees to facilitate mobility for transfers, ambulation, and other daily activity at PLOF.  Goal status: On going 05/16/2023   6.  Patient will demonstrate independent ambulation community distances > 500 ft for community integration.  Goal status: On going 05/16/2023   7.  Patient will demonstrate ascending/descending stairs reciprocally with one hand UE assist for community integration.    Goal Status: On going 05/16/2023      PLAN:   PT FREQUENCY: 1-2x/week   PT DURATION: 12 weeks   PLANNED INTERVENTIONS: Therapeutic exercises, Therapeutic activity, Neuro Muscular re-education, Balance training, Gait training, Patient/Family education, Joint mobilization, Stair training, DME instructions, Dry Needling, Electrical stimulation, Traction, Cryotherapy, vasopneumatic deviceMoist heat, Taping, Ultrasound, Ionotophoresis 4mg /ml Dexamethasone, and aquatic therapy, Manual therapy.  All included unless contraindicated   PLAN FOR NEXT SESSION:  Continue WB strengthening, balance improvements.  Intervention to promote longer duration stretching.   Chyrel Masson, PT, DPT, OCS, ATC 05/23/23  3:52 PM     Asking for additional 12 visits, through 06/28/2023  Referring diagnosis? M17.11 (ICD-10-CM) - Primary osteoarthritis of right knee Treatment diagnosis? (if different than referring diagnosis) M25.561 What was this (referring dx) caused by? [x]  Surgery []  Fall []  Ongoing issue []  Arthritis []  Other: ____________  Laterality: [x]  Rt []  Lt []  Both  Check all possible CPT codes:  *CHOOSE 10 OR LESS*    []  97110 (Therapeutic Exercise)  []  92507 (SLP Treatment)  []  47829 (Neuro Re-ed)   []  92526 (Swallowing Treatment)   []  97116 (Gait Training)   []  [id number] (Cognitive Training, 1st 15 minutes) []  56213 (Manual Therapy)   []  97130 (Cognitive Training, each add'l 15 minutes)  []  97164 (Re-evaluation)                              []  Other, List CPT Code ____________  []  97530 (Therapeutic Activities)     []  97535 (Self Care)   [x]  All codes above (97110 - 97535)  []  97012 (Mechanical Traction)  []  97014 (E-stim Unattended)  [x]  97032 (E-stim manual)  []  97033 (Ionto)  []  97035 (Ultrasound) [x]  97750 (Physical Performance Training) []  [id number] (Aquatic Therapy) [x]  97016 (Vasopneumatic Device) []  [id number] (Paraffin) []  97034 (Contrast Bath) []  97597 (Wound Care 1st 20 sq cm) []  97598 (Wound Care each add'l 20 sq  cm) []  97760 (Orthotic Fabrication, Fitting, Training Initial) []  [id number] (Prosthetic Management and Training Initial) []  [id number] (Orthotic or Prosthetic Training/ Modification Subsequent)

## 2023-05-29 ENCOUNTER — Encounter: Payer: Self-pay | Admitting: Rehabilitative and Restorative Service Providers"

## 2023-05-29 ENCOUNTER — Ambulatory Visit: Payer: Medicare PPO | Admitting: Rehabilitative and Restorative Service Providers"

## 2023-05-29 DIAGNOSIS — M6281 Muscle weakness (generalized): Secondary | ICD-10-CM | POA: Diagnosis not present

## 2023-05-29 DIAGNOSIS — R600 Localized edema: Secondary | ICD-10-CM | POA: Diagnosis not present

## 2023-05-29 DIAGNOSIS — R262 Difficulty in walking, not elsewhere classified: Secondary | ICD-10-CM

## 2023-05-29 DIAGNOSIS — M25561 Pain in right knee: Secondary | ICD-10-CM | POA: Diagnosis not present

## 2023-05-29 DIAGNOSIS — G8929 Other chronic pain: Secondary | ICD-10-CM | POA: Diagnosis not present

## 2023-05-29 NOTE — Therapy (Signed)
OUTPATIENT PHYSICAL THERAPY TREATMENT  Patient Name: [name] [name] MRN: [id number] DOB:01/07/1927, 89 y.o., female Today's Date: 05/29/2023   END OF SESSION:  PT End of Session - 05/29/23 1009     Visit Number 15    Number of Visits 24    Date for PT Re-Evaluation 06/28/23    Authorization Type [employer] $20 copay    Authorization Time Period 12 visits until 8/15    Authorization - Visit Number 3    Authorization - Number of Visits 12    Progress Note Due on Visit 24    PT Start Time 1011    PT Stop Time 1103    PT Time Calculation (min) 52 min    Activity Tolerance Patient tolerated treatment well    Behavior During Therapy [facility] for tasks assessed/performed                 Past Medical History:  Diagnosis Date   Anemia    Anxiety and depression 07/16/2009   ASD (atrial septal defect) per bubble study, 2004    Cardiomyopathy, normal cath 2007, normal EF per ECHO 2014    Hx of colonic polyps    Hypertension    Osteoarthritis    Osteopenia    Podagra 04/29/2014   RLS (restless legs syndrome) 07/10/2009   Past Surgical History:  Procedure Laterality Date   BACK SURGERY  2000   BREAST SURGERY     NEGATIVE BIOPSY   TONSILLECTOMY     TOTAL KNEE ARTHROPLASTY Right 03/22/2023   Procedure: RIGHT TOTAL KNEE ARTHROPLASTY;  Surgeon: Kathryne Hitch, MD;  Location: [facility];  Service: Orthopedics;  Laterality: Right;   Patient Active Problem List   Diagnosis Date Noted   Status post total right knee replacement 03/22/2023   Depression, recurrent (HCC) 02/20/2020   Prediabetes 01/26/2019   History of exercise intolerance 01/26/2019   HLD (hyperlipidemia) 04/07/2018   DJD (degenerative joint disease) 02/09/2010   PVCs (premature ventricular contractions) 07/20/2009   Anxiety and depression 07/16/2009   Cardiomyopathy (HCC) 04/23/2007   HTN (hypertension) 04/15/2007   Osteopenia 04/15/2007    PCP: Bary Leriche PA-C  REFERRING PROVIDER: Kathryne Hitch, MD  REFERRING DIAG: M17.11 (ICD-10-CM) - Primary osteoarthritis of right knee  THERAPY DIAG:  Chronic pain of right knee  Muscle weakness (generalized)  Difficulty in walking, not elsewhere classified  Localized edema  Rationale for Evaluation and Treatment: Rehabilitation  ONSET DATE: 03/22/2023 Rt TKA  SUBJECTIVE:    SUBJECTIVE STATEMENT: She indicated getting compression above knee.  Had some skin irritation at top.  She reported no real changes overall in pain, just stiff.  Daughter indicated swelling was less.    PERTINENT HISTORY: HTN, OA, osteopenia, history of restless leg syndrome   PAIN:  NPRS scale:  0/10 upon arrival.  Pain location: Rt knee  Pain description: "just pains every now and then that just hit"  Aggravating factors: static positioning Relieving factors: pain medicine, icing, movement    PRECAUTIONS: None   WEIGHT BEARING RESTRICTIONS: No   FALLS:  Has patient fallen in last 6 months? No   LIVING ENVIRONMENT: Lives with: lives with their family Lives in: House/apartment Stairs: flight of stairs to bedroom, rail on Lt going up.  Entry to house 3 stairs on Lt going up.  Has following equipment at home: FWW, SPC    OCCUPATION: Retired   PLOF: Independent, reading, meditation, watch tv.   Went to church, retirement groups   PATIENT  GOALS: Return to activity, reduce pain     OBJECTIVE: (objective measures completed at initial evaluation unless otherwise dated)    PATIENT SURVEYS:  05/16/2023:  FOTO update:  44 Answered with help of daughter  04/05/2023 FOTO intake: 23   predicted:  45   COGNITION: 04/05/2023 Overall cognitive status: WFL                        SENSATION: 04/05/2023 Not tested   EDEMA:  04/05/2023 Localized edema in Rt knee into lower leg.    MUSCLE LENGTH: 04/05/2023 No specific measurements   POSTURE:  04/05/2023:  forward trunk lean, rounded shoulders.    PALPATION: 04/05/2023 General tenderness  around Rt knee, incision, guarding in Rt quad   LOWER EXTREMITY ROM:    ROM Right 04/05/2023 Right 04/12/23 Right 04/16/23 Right 04/23/2023 Right 04/30/23 Right 05/16/2023  Hip flexion          Hip extension          Hip abduction          Hip adduction          Hip internal rotation          Hip external rotation          Knee flexion 56 AROM in supine heel slide  63* AAROM on Nustep with BUEs, 68* AROM seated at edge of mat table with knee flexion stretch 75 deg AAROM (strap + towel) seated Supine AROM heel slide 65  PROM heel slide 71  Pain limited  PROM  Seated 90 deg Supine heel slide AROM 85  Supine heel slide AROM 90  Knee extension -18 AROM in seated LAQ    -17* in knee extension stretch PROM seated   Seated AROM LAQ: -15  Supine AROM quad set -10   PROM  Supine -5 Seated LAQ -12  Supine quad set AROM -6  Ankle dorsiflexion          Ankle plantarflexion          Ankle inversion          Ankle eversion          04/05/2023:  pain noted in end ranges both direction Rt knee   (Blank rows = not tested)   LOWER EXTREMITY MMT:   MMT Right 04/05/2023 Left 04/05/2023 Right 05/16/2023 Right 05/29/2023   Hip flexion  3+/5  5/5     Hip extension         Hip abduction         Hip adduction         Hip internal rotation         Hip external rotation         Knee flexion 3+/5   5/5 4/5    Knee extension  2+/5  5/5 4/5 in available range 4+/5 in available range   Ankle dorsiflexion  4/5 5/5      Ankle plantarflexion         Ankle inversion         Ankle eversion          (Blank rows = not tested)   LOWER EXTREMITY SPECIAL TESTS:  04/05/2023 No specific testing   FUNCTIONAL TESTS:  04/05/2023 18 inch chair transfer:  UE required, deviation to Lt leg WB  Rt SLS: unable unassisted   GAIT: 05/15/2023:  Ambulation in clinic with [facility] use in Lt UE  05/07/2023:  Ambulation in clinic c  SPC in Lt UE c SBA.   04/05/2023 FWW ambulation c reduced gait speed, step length  bilateral, maintained knee flexion in stance lacking TKE.  Hip circumduction in swing to clear foot due to flexion restrictions.                                                                                                                                                                      TODAY'S TREATMENT                                                      DATE  05/29/2023 Therex: UBE LE only , seat 10 lvl 2.5 8 mins  Leg press double leg 81 lbs x 15, single leg 2 x 15 37 lbs Rt leg Seated Rt knee LAQ with flexion/extension end range pauses 1-2 seconds x 15 Gastroc incline stretch 30 sec x 3 bilaterally  Heel prop stretch to tolerance at start of vaso time 5 mins   Manual Seated Rt knee sustained flexion, contract/relax techniques for mobility gains into flexion.   Neuro Re-ed Tandem stance 1 min x 2 bilateral in // bars with moderate HHA use to correct loss of balance.   Vasopneumatic Device 10 mins Rt knee medium compression 34 deg in elevation with heel prop first 5 mins  TODAY'S TREATMENT                                                      DATE  05/23/2023 Therex: UBE LE only , seat 10 lvl 2.0 8 mins  Gastroc incline stretch 30 sec x 3 bilaterally  Step up WB on Rt leg 6 inch step with bilateral UE assist x 15 Step over and clear 6 inch hurdle and return with hand on bar x 10 bilateral Seated Rt knee flexion hold on floor 30 sec x 4  Leg press Rt leg only 31 lbs x 20  Heel prop stretch to tolerance at start of vaso time 5 mins   Vasopneumatic Device 10 mins Rt knee medium compression 34 deg in elevation with heel prop first 5 mins  TODAY'S TREATMENT  DATE  05/16/2023 Therex: UBE LE only , seat 11 lvl 2.0 8 mins  Seated Rt leg LAQ 3 lbs 2 sec pause end ranges 2 x 15 Sit to stand to sit 24 inch table x 15 no UE assist with assist from clinician tactile to prevent Lt leg movement back.  Seated Rt knee flexion c Lt leg  overpressure 15 sec x 5  Manual Seated Rt knee flexion c distraction/IR mobilization c movement.  Contract relax for Rt knee flexion.  Prolonged end range stretching.   Vasopneumatic Device 10 mins Rt knee medium compression 34 deg in elevation with heel prop first 5 mins  TODAY'S TREATMENT                                                      DATE  05/15/2023 Therex: UBE LE only , seat 11 lvl 2.0 10 mins  Incline board 30 sec X 3 Step ups onto 6 inch step WB on Rt leg hand assist on bar  x 15 c SBA Leg press  Rt leg only 31 lbs x 20  Seated Rt leg quad set c SLR 2 x 10 Heel prop 5 mins with vaso   Neuro Re-ed Alt toe tapping 4 inch step x 15 bilateral with CGA to min A at times Modified tandem stance with front leg elevated on 6 inch step 1 min trial, performed bilaterally with SBA to min A at times  Vasopneumatic Device 10 mins Rt knee medium compression 34 deg in elevation with heel prop first 5 mins   PATIENT EDUCATION:  04/19/2023 Education details: HEP, POC, rationale for interventions  Person educated: Patient Education method: Explanation, Demonstration, Verbal cues, and Handouts Education comprehension: verbalized understanding, returned demonstration, and verbal cues required   HOME EXERCISE PROGRAM: Access Code: [email or web address] URL: [email or web address] Date: 04/05/2023 Prepared by: Chyrel Masson  Exercises - Supine Heel Slide  - 3-5 x daily - 7 x weekly - 1 sets - 10 reps - 5 hold - Seated Long Arc Quad  - 3-5 x daily - 7 x weekly - 1 sets - 5-10 reps - 2 hold - Seated Knee Flexion Extension AAROM with Overpressure  - 3-5 x daily - 7 x weekly - 1 sets - 5 reps - 10 hold - Supine Knee Extension Mobilization with Weight  - 3-5 x daily - 7 x weekly - 1 sets - 1 reps - to tolerance up to 15 mins hold - Seated Quad Set  - 3-5 x daily - 7 x weekly - 1 sets - 10 reps - 5 hold   ASSESSMENT:   CLINICAL IMPRESSION: Static balance poor to fair at best.  Continued  improvement will help improve stability in ambulation.  Pt demonstrated mild improvements in tolerance/quality of passive range flexion with progressive longer duration stretching paired with contract/relax techniques for quad mobility.  Pt continued to make slow but steady overall progress overall with focus on long term HEP performance improvements for ultimate discharge in future.   OBJECTIVE IMPAIRMENTS: Abnormal gait, decreased activity tolerance, decreased balance, decreased coordination, decreased endurance, decreased mobility, difficulty walking, decreased ROM, decreased strength, hypomobility, increased edema, increased fascial restrictions, impaired perceived functional ability, increased muscle spasms, impaired flexibility, improper body mechanics, and pain.    ACTIVITY LIMITATIONS: carrying, lifting, bending, sitting, standing, squatting, sleeping, stairs, transfers, bed  mobility, and locomotion level   PARTICIPATION LIMITATIONS: meal prep, cleaning, laundry, interpersonal relationship, shopping, and community activity   PERSONAL FACTORS: Age and HTN, OA, osteopenia, history of restless leg syndrome are also affecting patient's functional outcome.    REHAB POTENTIAL: Good   CLINICAL DECISION MAKING: Stable/uncomplicated   EVALUATION COMPLEXITY: Low     GOALS: Goals reviewed with patient? Yes   SHORT TERM GOALS: (target date for Short term goals are 3 weeks 04/26/2023)    1.  Patient will demonstrate independent use of home exercise program to maintain progress from in clinic treatments.   Goal status: On going 04/30/2023   LONG TERM GOALS: (target dates for all long term goals are 12 weeks  06/28/2023 )   1. Patient will demonstrate/report pain at worst less than or equal to 2/10 to facilitate minimal limitation in daily activity secondary to pain symptoms.   Goal status: On going 05/16/2023   2. Patient will demonstrate independent use of home exercise program to facilitate  ability to maintain/progress functional gains from skilled physical therapy services.   Goal status: On going 05/16/2023   3. Patient will demonstrate FOTO outcome > or = 45 % to indicate reduced disability due to condition.   Goal status: On going 05/16/2023   4.  Patient will demonstrate Rt LE MMT 5/5 throughout to faciltiate usual transfers, stairs, squatting at [facility] for daily life.    Goal status: On going 05/16/2023   5.  Patient will demonstrate Rt knee AROM 0-110 degrees to facilitate mobility for transfers, ambulation, and other daily activity at PLOF.  Goal status: On going 05/16/2023   6.  Patient will demonstrate independent ambulation community distances > 500 ft for community integration.  Goal status: On going 05/16/2023   7.  Patient will demonstrate ascending/descending stairs reciprocally with one hand UE assist for community integration.    Goal Status: On going 05/16/2023     PLAN:   PT FREQUENCY: 1-2x/week   PT DURATION: 12 weeks   PLANNED INTERVENTIONS: Therapeutic exercises, Therapeutic activity, Neuro Muscular re-education, Balance training, Gait training, Patient/Family education, Joint mobilization, Stair training, DME instructions, Dry Needling, Electrical stimulation, Traction, Cryotherapy, vasopneumatic deviceMoist heat, Taping, Ultrasound, Ionotophoresis 4mg /ml Dexamethasone, and aquatic therapy, Manual therapy.  All included unless contraindicated   PLAN FOR NEXT SESSION:  Longer duration stretching/intervention for mobility, progressive balance improvements and quad strengthening.   Chyrel Masson, PT, DPT, OCS, ATC 05/29/23  11:03 AM     Asking for additional 12 visits, through 06/28/2023  Referring diagnosis? M17.11 (ICD-10-CM) - Primary osteoarthritis of right knee Treatment diagnosis? (if different than referring diagnosis) M25.561 What was this (referring dx) caused by? [x]  Surgery []  Fall []  Ongoing issue []  Arthritis []  Other:  ____________  Laterality: [x]  Rt []  Lt []  Both  Check all possible CPT codes:  *CHOOSE 10 OR LESS*    []  97110 (Therapeutic Exercise)  []  92507 (SLP Treatment)  []  16109 (Neuro Re-ed)   []  92526 (Swallowing Treatment)   []  97116 (Gait Training)   []  [id number] (Cognitive Training, 1st 15 minutes) []  97140 (Manual Therapy)   []  97130 (Cognitive Training, each add'l 15 minutes)  []  97164 (Re-evaluation)                              []  Other, List CPT Code ____________  []  97530 (Therapeutic Activities)     []  97535 (Self Care)   [x]  All codes above (  97110 - 97535)  []  [id number] (Mechanical Traction)  []  97014 (E-stim Unattended)  [x]  97032 (E-stim manual)  []  97033 (Ionto)  []  97035 (Ultrasound) [x]  97750 (Physical Performance Training) []  [id number] (Aquatic Therapy) [x]  97016 (Vasopneumatic Device) []  [id number] (Paraffin) []  97034 (Contrast Bath) []  97597 (Wound Care 1st 20 sq cm) []  97598 (Wound Care each add'l 20 sq cm) []  97760 (Orthotic Fabrication, Fitting, Training Initial) []  [id number] (Prosthetic Management and Training Initial) []  40981 (Orthotic or Prosthetic Training/ Modification Subsequent)

## 2023-06-01 ENCOUNTER — Ambulatory Visit: Payer: Medicare PPO | Admitting: Rehabilitative and Restorative Service Providers"

## 2023-06-01 ENCOUNTER — Encounter: Payer: Self-pay | Admitting: Rehabilitative and Restorative Service Providers"

## 2023-06-01 DIAGNOSIS — R600 Localized edema: Secondary | ICD-10-CM

## 2023-06-01 DIAGNOSIS — R262 Difficulty in walking, not elsewhere classified: Secondary | ICD-10-CM | POA: Diagnosis not present

## 2023-06-01 DIAGNOSIS — M25561 Pain in right knee: Secondary | ICD-10-CM | POA: Diagnosis not present

## 2023-06-01 DIAGNOSIS — G8929 Other chronic pain: Secondary | ICD-10-CM

## 2023-06-01 DIAGNOSIS — M6281 Muscle weakness (generalized): Secondary | ICD-10-CM

## 2023-06-01 NOTE — Therapy (Signed)
OUTPATIENT PHYSICAL THERAPY TREATMENT  Patient Name: [name] [name] MRN: [id number] DOB:19-Feb-1927, 89 y.o., female Today's Date: 06/01/2023   END OF SESSION:  PT End of Session - 06/01/23 1303     Visit Number 16    Number of Visits 24    Date for PT Re-Evaluation 06/28/23    Authorization Type [employer] $20 copay    Authorization Time Period 12 visits until 8/15    Authorization - Visit Number 4    Authorization - Number of Visits 12    Progress Note Due on Visit 24    PT Start Time 1259    PT Stop Time 1345    PT Time Calculation (min) 46 min    Activity Tolerance Patient tolerated treatment well    Behavior During Therapy [facility] for tasks assessed/performed                  Past Medical History:  Diagnosis Date   Anemia    Anxiety and depression 07/16/2009   ASD (atrial septal defect) per bubble study, 2004    Cardiomyopathy, normal cath 2007, normal EF per ECHO 2014    Hx of colonic polyps    Hypertension    Osteoarthritis    Osteopenia    Podagra 04/29/2014   RLS (restless legs syndrome) 07/10/2009   Past Surgical History:  Procedure Laterality Date   BACK SURGERY  2000   BREAST SURGERY     NEGATIVE BIOPSY   TONSILLECTOMY     TOTAL KNEE ARTHROPLASTY Right 03/22/2023   Procedure: RIGHT TOTAL KNEE ARTHROPLASTY;  Surgeon: Kathryne Hitch, MD;  Location: [facility];  Service: Orthopedics;  Laterality: Right;   Patient Active Problem List   Diagnosis Date Noted   Status post total right knee replacement 03/22/2023   Depression, recurrent (HCC) 02/20/2020   Prediabetes 01/26/2019   History of exercise intolerance 01/26/2019   HLD (hyperlipidemia) 04/07/2018   DJD (degenerative joint disease) 02/09/2010   PVCs (premature ventricular contractions) 07/20/2009   Anxiety and depression 07/16/2009   Cardiomyopathy (HCC) 04/23/2007   HTN (hypertension) 04/15/2007   Osteopenia 04/15/2007    PCP: Bary Leriche PA-C  REFERRING PROVIDER:  Kathryne Hitch, MD  REFERRING DIAG: M17.11 (ICD-10-CM) - Primary osteoarthritis of right knee  THERAPY DIAG:  Chronic pain of right knee  Muscle weakness (generalized)  Difficulty in walking, not elsewhere classified  Localized edema  Rationale for Evaluation and Treatment: Rehabilitation  ONSET DATE: 03/22/2023 Rt TKA  SUBJECTIVE:    SUBJECTIVE STATEMENT: Pt indicated doing the walking with the cane.  She indicated using the walker at night mainly.  Reported no specific pain upon arrival, just tightness with stretching end range.    PERTINENT HISTORY: HTN, OA, osteopenia, history of restless leg syndrome   PAIN:  NPRS scale:  0/10 upon arrival.  Pain location: Rt knee  Pain description: "just pains every now and then that just hit"  Aggravating factors: static positioning Relieving factors: pain medicine, icing, movement    PRECAUTIONS: None   WEIGHT BEARING RESTRICTIONS: No   FALLS:  Has patient fallen in last 6 months? No   LIVING ENVIRONMENT: Lives with: lives with their family Lives in: House/apartment Stairs: flight of stairs to bedroom, rail on Lt going up.  Entry to house 3 stairs on Lt going up.  Has following equipment at home: FWW, SPC    OCCUPATION: Retired   PLOF: Independent, reading, meditation, watch tv.   Went to church, retirement groups  PATIENT GOALS: Return to activity, reduce pain     OBJECTIVE: (objective measures completed at initial evaluation unless otherwise dated)    PATIENT SURVEYS:  05/16/2023:  FOTO update:  44 Answered with help of daughter  04/05/2023 FOTO intake: 23   predicted:  45   COGNITION: 04/05/2023 Overall cognitive status: WFL                        SENSATION: 04/05/2023 Not tested   EDEMA:  04/05/2023 Localized edema in Rt knee into lower leg.    MUSCLE LENGTH: 04/05/2023 No specific measurements   POSTURE:  04/05/2023:  forward trunk lean, rounded shoulders.    PALPATION: 04/05/2023 General  tenderness around Rt knee, incision, guarding in Rt quad   LOWER EXTREMITY ROM:    ROM Right 04/05/2023 Right 04/12/23 Right 04/16/23 Right 04/23/2023 Right 04/30/23 Right 05/16/2023 Right 06/01/2023  Hip flexion           Hip extension           Hip abduction           Hip adduction           Hip internal rotation           Hip external rotation           Knee flexion 56 AROM in supine heel slide  63* AAROM on Nustep with BUEs, 68* AROM seated at edge of mat table with knee flexion stretch 75 deg AAROM (strap + towel) seated Supine AROM heel slide 65  PROM heel slide 71  Pain limited  PROM  Seated 90 deg Supine heel slide AROM 85  Supine heel slide PROM 90 Supine heel slide AROM 90  Knee extension -18 AROM in seated LAQ    -17* in knee extension stretch PROM seated   Seated AROM LAQ: -15  Supine AROM quad set -10   PROM  Supine -5 Seated LAQ -12  Supine quad set AROM -6 Supine quad set AROM  -5   Ankle dorsiflexion           Ankle plantarflexion           Ankle inversion           Ankle eversion           04/05/2023:  pain noted in end ranges both direction Rt knee   (Blank rows = not tested)   LOWER EXTREMITY MMT:   MMT Right 04/05/2023 Left 04/05/2023 Right 05/16/2023 Right 05/29/2023   Hip flexion  3+/5  5/5     Hip extension         Hip abduction         Hip adduction         Hip internal rotation         Hip external rotation         Knee flexion 3+/5   5/5 4/5    Knee extension  2+/5  5/5 4/5 in available range 4+/5 in available range   Ankle dorsiflexion  4/5 5/5      Ankle plantarflexion         Ankle inversion         Ankle eversion          (Blank rows = not tested)   LOWER EXTREMITY SPECIAL TESTS:  04/05/2023 No specific testing   FUNCTIONAL TESTS:  04/05/2023 18 inch chair transfer:  UE required, deviation to Lt leg WB  Rt SLS: unable unassisted   GAIT: 05/15/2023:  Ambulation in clinic with [facility] use in Lt UE  05/07/2023:  Ambulation in  clinic c SPC in Lt UE c SBA.   04/05/2023 FWW ambulation c reduced gait speed, step length bilateral, maintained knee flexion in stance lacking TKE.  Hip circumduction in swing to clear foot due to flexion restrictions.                                                                                                                                                                      TODAY'S TREATMENT                                                      DATE  06/01/2023 Therex: UBE LE only , seat 10 lvl 2.5 8 mins  Gastroc incline stretch 30 sec x 3 bilaterally  Step up 6 inch step x 15 bilateral with single hand assist on bar  Leg press double leg 81 lbs x 15, single leg 2 x 15 37 lbs Rt leg Seated Rt knee LAQ with flexion/extension end range pauses 1-2 seconds x 15  Heel prop stretch to tolerance at start of vaso time 5 mins   Manual Seated Rt knee sustained flexion, contract/relax techniques for mobility gains into flexion.   Neuro Re-ed Tandem stance 1 min x 1 bilateral in // bars with moderate HHA use to correct loss of balance.  Alternating heel/toe lifts with hand hold touch down on bar   Vasopneumatic Device 10 mins Rt knee medium compression 34 deg in elevation with heel prop first 5 mins  TODAY'S TREATMENT                                                      DATE  05/29/2023 Therex: UBE LE only , seat 10 lvl 2.5 8 mins  Leg press double leg 81 lbs x 15, single leg 2 x 15 37 lbs Rt leg Seated Rt knee LAQ with flexion/extension end range pauses 1-2 seconds x 15 Gastroc incline stretch 30 sec x 3 bilaterally  Heel prop stretch to tolerance at start of vaso time 5 mins   Manual Seated Rt knee sustained flexion, contract/relax techniques for mobility gains into flexion.   Neuro Re-ed Tandem stance 1 min x 2 bilateral in // bars with moderate HHA use to correct loss of balance.   Vasopneumatic Device 10 mins  Rt knee medium compression 34 deg in elevation with heel prop first 5  mins  TODAY'S TREATMENT                                                      DATE  05/23/2023 Therex: UBE LE only , seat 10 lvl 2.0 8 mins  Gastroc incline stretch 30 sec x 3 bilaterally  Step up WB on Rt leg 6 inch step with bilateral UE assist x 15 Step over and clear 6 inch hurdle and return with hand on bar x 10 bilateral Seated Rt knee flexion hold on floor 30 sec x 4  Leg press Rt leg only 31 lbs x 20  Heel prop stretch to tolerance at start of vaso time 5 mins   Vasopneumatic Device 10 mins Rt knee medium compression 34 deg in elevation with heel prop first 5 mins  TODAY'S TREATMENT                                                      DATE  05/16/2023 Therex: UBE LE only , seat 11 lvl 2.0 8 mins  Seated Rt leg LAQ 3 lbs 2 sec pause end ranges 2 x 15 Sit to stand to sit 24 inch table x 15 no UE assist with assist from clinician tactile to prevent Lt leg movement back.  Seated Rt knee flexion c Lt leg overpressure 15 sec x 5  Manual Seated Rt knee flexion c distraction/IR mobilization c movement.  Contract relax for Rt knee flexion.  Prolonged end range stretching.   Vasopneumatic Device 10 mins Rt knee medium compression 34 deg in elevation with heel prop first 5 mins  PATIENT EDUCATION:  04/19/2023 Education details: HEP, POC, rationale for interventions  Person educated: Patient Education method: Explanation, Demonstration, Verbal cues, and Handouts Education comprehension: verbalized understanding, returned demonstration, and verbal cues required   HOME EXERCISE PROGRAM: Access Code: [email or web address] URL: [email or web address] Date: 04/05/2023 Prepared by: Chyrel Masson  Exercises - Supine Heel Slide  - 3-5 x daily - 7 x weekly - 1 sets - 10 reps - 5 hold - Seated Long Arc Quad  - 3-5 x daily - 7 x weekly - 1 sets - 5-10 reps - 2 hold - Seated Knee Flexion Extension AAROM with Overpressure  - 3-5 x daily - 7 x weekly - 1 sets - 5 reps - 10 hold - Supine Knee  Extension Mobilization with Weight  - 3-5 x daily - 7 x weekly - 1 sets - 1 reps - to tolerance up to 15 mins hold - Seated Quad Set  - 3-5 x daily - 7 x weekly - 1 sets - 10 reps - 5 hold   ASSESSMENT:   CLINICAL IMPRESSION: Very mild gains noted in active range measurements today , specifically flexion.  Combination of muscle guarding and capsular tightness evident for flexion mobility restriction.  Extension deficit continued to impact functional gait patterns, resulting in maintained knee flexion in WB.    OBJECTIVE IMPAIRMENTS: Abnormal gait, decreased activity tolerance, decreased balance, decreased coordination, decreased endurance, decreased mobility, difficulty walking, decreased ROM, decreased strength, hypomobility, increased edema, increased fascial restrictions, impaired  perceived functional ability, increased muscle spasms, impaired flexibility, improper body mechanics, and pain.    ACTIVITY LIMITATIONS: carrying, lifting, bending, sitting, standing, squatting, sleeping, stairs, transfers, bed mobility, and locomotion level   PARTICIPATION LIMITATIONS: meal prep, cleaning, laundry, interpersonal relationship, shopping, and community activity   PERSONAL FACTORS: Age and HTN, OA, osteopenia, history of restless leg syndrome are also affecting patient's functional outcome.    REHAB POTENTIAL: Good   CLINICAL DECISION MAKING: Stable/uncomplicated   EVALUATION COMPLEXITY: Low     GOALS: Goals reviewed with patient? Yes   SHORT TERM GOALS: (target date for Short term goals are 3 weeks 04/26/2023)    1.  Patient will demonstrate independent use of home exercise program to maintain progress from in clinic treatments.   Goal status: On going 04/30/2023   LONG TERM GOALS: (target dates for all long term goals are 12 weeks  06/28/2023 )   1. Patient will demonstrate/report pain at worst less than or equal to 2/10 to facilitate minimal limitation in daily activity secondary to pain  symptoms.   Goal status: On going 05/16/2023   2. Patient will demonstrate independent use of home exercise program to facilitate ability to maintain/progress functional gains from skilled physical therapy services.   Goal status: On going 05/16/2023   3. Patient will demonstrate FOTO outcome > or = 45 % to indicate reduced disability due to condition.   Goal status: On going 05/16/2023   4.  Patient will demonstrate Rt LE MMT 5/5 throughout to faciltiate usual transfers, stairs, squatting at [place] for daily life.    Goal status: On going 05/16/2023   5.  Patient will demonstrate Rt knee AROM 0-110 degrees to facilitate mobility for transfers, ambulation, and other daily activity at PLOF.  Goal status: On going 05/16/2023   6.  Patient will demonstrate independent ambulation community distances > 500 ft for community integration.  Goal status: On going 05/16/2023   7.  Patient will demonstrate ascending/descending stairs reciprocally with one hand UE assist for community integration.    Goal Status: On going 05/16/2023     PLAN:   PT FREQUENCY: 1-2x/week   PT DURATION: 12 weeks   PLANNED INTERVENTIONS: Therapeutic exercises, Therapeutic activity, Neuro Muscular re-education, Balance training, Gait training, Patient/Family education, Joint mobilization, Stair training, DME instructions, Dry Needling, Electrical stimulation, Traction, Cryotherapy, vasopneumatic deviceMoist heat, Taping, Ultrasound, Ionotophoresis 4mg /ml Dexamethasone, and aquatic therapy, Manual therapy.  All included unless contraindicated   PLAN FOR NEXT SESSION:  Possible frequency reduction in future POC with focus on improving confidence with HEP transitioning if objective data plateaus in future.   Chyrel Masson, PT, DPT, OCS, ATC 06/01/23  1:36 PM     Asking for additional 12 visits, through 06/28/2023  Referring diagnosis? M17.11 (ICD-10-CM) - Primary osteoarthritis of right knee Treatment diagnosis? (if  different than referring diagnosis) M25.561 What was this (referring dx) caused by? [x]  Surgery []  Fall []  Ongoing issue []  Arthritis []  Other: ____________  Laterality: [x]  Rt []  Lt []  Both  Check all possible CPT codes:  *CHOOSE 10 OR LESS*    []  97110 (Therapeutic Exercise)  []  92507 (SLP Treatment)  []  97112 (Neuro Re-ed)   []  92526 (Swallowing Treatment)   []  97116 (Gait Training)   []  [id number] (Cognitive Training, 1st 15 minutes) []  97140 (Manual Therapy)   []  97130 (Cognitive Training, each add'l 15 minutes)  []  97164 (Re-evaluation)                              []   Other, List CPT Code ____________  []  97530 (Therapeutic Activities)     []  97535 (Self Care)   [x]  All codes above (97110 - 97535)  []  97012 (Mechanical Traction)  []  97014 (E-stim Unattended)  [x]  97032 (E-stim manual)  []  97033 (Ionto)  []  97035 (Ultrasound) [x]  97750 (Physical Performance Training) []  [id number] (Aquatic Therapy) [x]  97016 (Vasopneumatic Device) []  [id number] (Paraffin) []  97034 (Contrast Bath) []  97597 (Wound Care 1st 20 sq cm) []  97598 (Wound Care each add'l 20 sq cm) []  97760 (Orthotic Fabrication, Fitting, Training Initial) []  [id number] (Prosthetic Management and Training Initial) []  [id number] (Orthotic or Prosthetic Training/ Modification Subsequent)

## 2023-06-04 ENCOUNTER — Ambulatory Visit: Payer: Medicare PPO | Admitting: Rehabilitative and Restorative Service Providers"

## 2023-06-04 ENCOUNTER — Encounter: Payer: Self-pay | Admitting: Rehabilitative and Restorative Service Providers"

## 2023-06-04 DIAGNOSIS — G8929 Other chronic pain: Secondary | ICD-10-CM | POA: Diagnosis not present

## 2023-06-04 DIAGNOSIS — M25561 Pain in right knee: Secondary | ICD-10-CM

## 2023-06-04 DIAGNOSIS — M6281 Muscle weakness (generalized): Secondary | ICD-10-CM | POA: Diagnosis not present

## 2023-06-04 DIAGNOSIS — R262 Difficulty in walking, not elsewhere classified: Secondary | ICD-10-CM

## 2023-06-04 DIAGNOSIS — R600 Localized edema: Secondary | ICD-10-CM | POA: Diagnosis not present

## 2023-06-04 NOTE — Therapy (Signed)
OUTPATIENT PHYSICAL THERAPY TREATMENT  Patient Name: [name] [name] MRN: [id number] DOB:05-12-1927, 89 y.o., female Today's Date: 06/04/2023   END OF SESSION:  PT End of Session - 06/04/23 1304     Visit Number 17    Number of Visits 24    Date for PT Re-Evaluation 06/28/23    Authorization Type [employer] $20 copay    Authorization Time Period 12 visits until 8/15    Authorization - Visit Number 5    Authorization - Number of Visits 12    Progress Note Due on Visit 24    PT Start Time 1300    PT Stop Time 1345    PT Time Calculation (min) 45 min    Activity Tolerance Patient tolerated treatment well    Behavior During Therapy [facility] for tasks assessed/performed                   Past Medical History:  Diagnosis Date   Anemia    Anxiety and depression 07/16/2009   ASD (atrial septal defect) per bubble study, 2004    Cardiomyopathy, normal cath 2007, normal EF per ECHO 2014    Hx of colonic polyps    Hypertension    Osteoarthritis    Osteopenia    Podagra 04/29/2014   RLS (restless legs syndrome) 07/10/2009   Past Surgical History:  Procedure Laterality Date   BACK SURGERY  2000   BREAST SURGERY     NEGATIVE BIOPSY   TONSILLECTOMY     TOTAL KNEE ARTHROPLASTY Right 03/22/2023   Procedure: RIGHT TOTAL KNEE ARTHROPLASTY;  Surgeon: Kathryne Hitch, MD;  Location: [facility];  Service: Orthopedics;  Laterality: Right;   Patient Active Problem List   Diagnosis Date Noted   Status post total right knee replacement 03/22/2023   Depression, recurrent (HCC) 02/20/2020   Prediabetes 01/26/2019   History of exercise intolerance 01/26/2019   HLD (hyperlipidemia) 04/07/2018   DJD (degenerative joint disease) 02/09/2010   PVCs (premature ventricular contractions) 07/20/2009   Anxiety and depression 07/16/2009   Cardiomyopathy (HCC) 04/23/2007   HTN (hypertension) 04/15/2007   Osteopenia 04/15/2007    PCP: Bary Leriche PA-C  REFERRING PROVIDER:  Kathryne Hitch, MD  REFERRING DIAG: M17.11 (ICD-10-CM) - Primary osteoarthritis of right knee  THERAPY DIAG:  Chronic pain of right knee  Muscle weakness (generalized)  Difficulty in walking, not elsewhere classified  Localized edema  Rationale for Evaluation and Treatment: Rehabilitation  ONSET DATE: 03/22/2023 Rt TKA  SUBJECTIVE:    SUBJECTIVE STATEMENT: She indicated doing the exercises more by herself without help from family member.  Pt indicated no pain upon arrival.    PERTINENT HISTORY: HTN, OA, osteopenia, history of restless leg syndrome   PAIN:  NPRS scale:  0/10 upon arrival.  Pain location: Rt knee  Pain description: "just pains every now and then that just hit"  Aggravating factors: static positioning Relieving factors: pain medicine, icing, movement    PRECAUTIONS: None   WEIGHT BEARING RESTRICTIONS: No   FALLS:  Has patient fallen in last 6 months? No   LIVING ENVIRONMENT: Lives with: lives with their family Lives in: House/apartment Stairs: flight of stairs to bedroom, rail on Lt going up.  Entry to house 3 stairs on Lt going up.  Has following equipment at home: FWW, SPC    OCCUPATION: Retired   PLOF: Independent, reading, meditation, watch tv.   Went to church, retirement groups   PATIENT GOALS: Return to activity, reduce pain  OBJECTIVE: (objective measures completed at initial evaluation unless otherwise dated)    PATIENT SURVEYS:  05/16/2023:  FOTO update:  44 Answered with help of daughter  04/05/2023 FOTO intake: 23   predicted:  45   COGNITION: 04/05/2023 Overall cognitive status: WFL                        SENSATION: 04/05/2023 Not tested   EDEMA:  04/05/2023 Localized edema in Rt knee into lower leg.    MUSCLE LENGTH: 04/05/2023 No specific measurements   POSTURE:  04/05/2023:  forward trunk lean, rounded shoulders.    PALPATION: 04/05/2023 General tenderness around Rt knee, incision, guarding in Rt quad    LOWER EXTREMITY ROM:    ROM Right 04/05/2023 Right 04/12/23 Right 04/16/23 Right 04/23/2023 Right 04/30/23 Right 05/16/2023 Right 06/01/2023  Hip flexion           Hip extension           Hip abduction           Hip adduction           Hip internal rotation           Hip external rotation           Knee flexion 56 AROM in supine heel slide  63* AAROM on Nustep with BUEs, 68* AROM seated at edge of mat table with knee flexion stretch 75 deg AAROM (strap + towel) seated Supine AROM heel slide 65  PROM heel slide 71  Pain limited  PROM  Seated 90 deg Supine heel slide AROM 85  Supine heel slide PROM 90 Supine heel slide AROM 90  Knee extension -18 AROM in seated LAQ    -17* in knee extension stretch PROM seated   Seated AROM LAQ: -15  Supine AROM quad set -10   PROM  Supine -5 Seated LAQ -12  Supine quad set AROM -6 Supine quad set AROM  -5   Ankle dorsiflexion           Ankle plantarflexion           Ankle inversion           Ankle eversion           04/05/2023:  pain noted in end ranges both direction Rt knee   (Blank rows = not tested)   LOWER EXTREMITY MMT:   MMT Right 04/05/2023 Left 04/05/2023 Right 05/16/2023 Right 05/29/2023   Hip flexion  3+/5  5/5     Hip extension         Hip abduction         Hip adduction         Hip internal rotation         Hip external rotation         Knee flexion 3+/5   5/5 4/5    Knee extension  2+/5  5/5 4/5 in available range 4+/5 in available range   Ankle dorsiflexion  4/5 5/5      Ankle plantarflexion         Ankle inversion         Ankle eversion          (Blank rows = not tested)   LOWER EXTREMITY SPECIAL TESTS:  04/05/2023 No specific testing   FUNCTIONAL TESTS:  04/05/2023 18 inch chair transfer:  UE required, deviation to Lt leg WB  Rt SLS: unable unassisted   GAIT: 05/15/2023:  Ambulation in  clinic with [facility] use in Lt UE  05/07/2023:  Ambulation in clinic c SPC in Lt UE c SBA.   04/05/2023 FWW ambulation c  reduced gait speed, step length bilateral, maintained knee flexion in stance lacking TKE.  Hip circumduction in swing to clear foot due to flexion restrictions.                                                                                                                                                                      TODAY'S TREATMENT                                                      DATE  06/04/2023 Therex: Nustep lvl 6 4.5 mins , lvl 4 3.5 mins (8 total) UE/LE  Gastroc incline stretch 60 sec x 3 bilaterally with strap overpressure for knee extension on Rt by clinician  Lateral step up/down WB on Rt leg with bilateral hand assist on bar 2 x 10  Seated Rt knee LAQ with flexion/extension end range pauses 1-2 seconds 2 x 15 3 lbs   Heel prop stretch to tolerance of vaso time 10 mins   Neuro Re-ed Tandem stance 1 min x 2 bilateral in // bars with moderate HHA use to correct loss of balance.  SBA  Alternating heel/toe lifts with single hand hold touch down on bar x 20  Vasopneumatic Device 10 mins Rt knee medium compression 34 deg in elevation with heel prop first 5 mins  TODAY'S TREATMENT                                                      DATE  06/01/2023 Therex: UBE LE only , seat 10 lvl 2.5 8 mins  Gastroc incline stretch 30 sec x 3 bilaterally  Step up 6 inch step x 15 bilateral with single hand assist on bar  Leg press double leg 81 lbs x 15, single leg 2 x 15 37 lbs Rt leg Seated Rt knee LAQ with flexion/extension end range pauses 1-2 seconds x 15  Heel prop stretch to tolerance at start of vaso time 5 mins   Manual Seated Rt knee sustained flexion, contract/relax techniques for mobility gains into flexion.   Neuro Re-ed Tandem stance 1 min x 1 bilateral in // bars with moderate HHA use to correct loss of balance.  Alternating heel/toe lifts with hand hold touch down on bar  Vasopneumatic Device 10 mins Rt knee medium compression 34 deg in elevation with heel prop  first 5 mins  TODAY'S TREATMENT                                                      DATE  05/29/2023 Therex: UBE LE only , seat 10 lvl 2.5 8 mins  Leg press double leg 81 lbs x 15, single leg 2 x 15 37 lbs Rt leg Seated Rt knee LAQ with flexion/extension end range pauses 1-2 seconds x 15 Gastroc incline stretch 30 sec x 3 bilaterally  Heel prop stretch to tolerance at start of vaso time 5 mins   Manual Seated Rt knee sustained flexion, contract/relax techniques for mobility gains into flexion.   Neuro Re-ed Tandem stance 1 min x 2 bilateral in // bars with moderate HHA use to correct loss of balance.   Vasopneumatic Device 10 mins Rt knee medium compression 34 deg in elevation with heel prop first 5 mins  TODAY'S TREATMENT                                                      DATE  05/23/2023 Therex: UBE LE only , seat 10 lvl 2.0 8 mins  Gastroc incline stretch 30 sec x 3 bilaterally  Step up WB on Rt leg 6 inch step with bilateral UE assist x 15 Step over and clear 6 inch hurdle and return with hand on bar x 10 bilateral Seated Rt knee flexion hold on floor 30 sec x 4  Leg press Rt leg only 31 lbs x 20  Heel prop stretch to tolerance at start of vaso time 5 mins   Vasopneumatic Device 10 mins Rt knee medium compression 34 deg in elevation with heel prop first 5 mins   PATIENT EDUCATION:  04/19/2023 Education details: HEP, POC, rationale for interventions  Person educated: Patient Education method: Explanation, Demonstration, Verbal cues, and Handouts Education comprehension: verbalized understanding, returned demonstration, and verbal cues required   HOME EXERCISE PROGRAM: Access Code: [email or web address] URL: [email or web address] Date: 04/05/2023 Prepared by: Chyrel Masson  Exercises - Supine Heel Slide  - 3-5 x daily - 7 x weekly - 1 sets - 10 reps - 5 hold - Seated Long Arc Quad  - 3-5 x daily - 7 x weekly - 1 sets - 5-10 reps - 2 hold - Seated Knee Flexion  Extension AAROM with Overpressure  - 3-5 x daily - 7 x weekly - 1 sets - 5 reps - 10 hold - Supine Knee Extension Mobilization with Weight  - 3-5 x daily - 7 x weekly - 1 sets - 1 reps - to tolerance up to 15 mins hold - Seated Quad Set  - 3-5 x daily - 7 x weekly - 1 sets - 10 reps - 5 hold   ASSESSMENT:   CLINICAL IMPRESSION: Addition of clinician assisted terminal knee extension in gastroc stretch to incorporate extra knee extension focus into stretch.  Pt was still reliant on routine use of hand assist on bars in neuro re-ed but decreasing the pressure and use slow but steadily.   Improved quad strength, general mobility to help  improve balance and functional movement coordination.   OBJECTIVE IMPAIRMENTS: Abnormal gait, decreased activity tolerance, decreased balance, decreased coordination, decreased endurance, decreased mobility, difficulty walking, decreased ROM, decreased strength, hypomobility, increased edema, increased fascial restrictions, impaired perceived functional ability, increased muscle spasms, impaired flexibility, improper body mechanics, and pain.    ACTIVITY LIMITATIONS: carrying, lifting, bending, sitting, standing, squatting, sleeping, stairs, transfers, bed mobility, and locomotion level   PARTICIPATION LIMITATIONS: meal prep, cleaning, laundry, interpersonal relationship, shopping, and community activity   PERSONAL FACTORS: Age and HTN, OA, osteopenia, history of restless leg syndrome are also affecting patient's functional outcome.    REHAB POTENTIAL: Good   CLINICAL DECISION MAKING: Stable/uncomplicated   EVALUATION COMPLEXITY: Low     GOALS: Goals reviewed with patient? Yes   SHORT TERM GOALS: (target date for Short term goals are 3 weeks 04/26/2023)    1.  Patient will demonstrate independent use of home exercise program to maintain progress from in clinic treatments.   Goal status: On going 04/30/2023   LONG TERM GOALS: (target dates for all long term  goals are 12 weeks  06/28/2023 )   1. Patient will demonstrate/report pain at worst less than or equal to 2/10 to facilitate minimal limitation in daily activity secondary to pain symptoms.   Goal status: On going 05/16/2023   2. Patient will demonstrate independent use of home exercise program to facilitate ability to maintain/progress functional gains from skilled physical therapy services.   Goal status: On going 05/16/2023   3. Patient will demonstrate FOTO outcome > or = 45 % to indicate reduced disability due to condition.   Goal status: On going 05/16/2023   4.  Patient will demonstrate Rt LE MMT 5/5 throughout to faciltiate usual transfers, stairs, squatting at [facility] for daily life.    Goal status: On going 05/16/2023   5.  Patient will demonstrate Rt knee AROM 0-110 degrees to facilitate mobility for transfers, ambulation, and other daily activity at PLOF.  Goal status: On going 05/16/2023   6.  Patient will demonstrate independent ambulation community distances > 500 ft for community integration.  Goal status: On going 05/16/2023   7.  Patient will demonstrate ascending/descending stairs reciprocally with one hand UE assist for community integration.    Goal Status: On going 05/16/2023     PLAN:   PT FREQUENCY: 1-2x/week   PT DURATION: 12 weeks   PLANNED INTERVENTIONS: Therapeutic exercises, Therapeutic activity, Neuro Muscular re-education, Balance training, Gait training, Patient/Family education, Joint mobilization, Stair training, DME instructions, Dry Needling, Electrical stimulation, Traction, Cryotherapy, vasopneumatic deviceMoist heat, Taping, Ultrasound, Ionotophoresis 4mg /ml Dexamethasone, and aquatic therapy, Manual therapy.  All included unless contraindicated   PLAN FOR NEXT SESSION:  Continue to focus on building confidence in HEP use for strength and mobility gains and safe balance.   Chyrel Masson, PT, DPT, OCS, ATC 06/04/23  1:39 PM     Asking for additional  12 visits, through 06/28/2023  Referring diagnosis? M17.11 (ICD-10-CM) - Primary osteoarthritis of right knee Treatment diagnosis? (if different than referring diagnosis) M25.561 What was this (referring dx) caused by? [x]  Surgery []  Fall []  Ongoing issue []  Arthritis []  Other: ____________  Laterality: [x]  Rt []  Lt []  Both  Check all possible CPT codes:  *CHOOSE 10 OR LESS*    []  97110 (Therapeutic Exercise)  []  92507 (SLP Treatment)  []  97112 (Neuro Re-ed)   []  92526 (Swallowing Treatment)   []  97116 (Gait Training)   []  [id number] (Cognitive Training, 1st 15 minutes) []  97140 (  Manual Therapy)   []  97130 (Cognitive Training, each add'l 15 minutes)  []  97164 (Re-evaluation)                              []  Other, List CPT Code ____________  []  97530 (Therapeutic Activities)     []  97535 (Self Care)   [x]  All codes above (97110 - 97535)  []  97012 (Mechanical Traction)  []  97014 (E-stim Unattended)  [x]  97032 (E-stim manual)  []  97033 (Ionto)  []  97035 (Ultrasound) [x]  97750 (Physical Performance Training) []  [id number] (Aquatic Therapy) [x]  19147 (Vasopneumatic Device) []  [id number] (Paraffin) []  97034 (Contrast Bath) []  97597 (Wound Care 1st 20 sq cm) []  97598 (Wound Care each add'l 20 sq cm) []  97760 (Orthotic Fabrication, Fitting, Training Initial) []  [id number] (Prosthetic Management and Training Initial) []  [phone] (Orthotic or Prosthetic Training/ Modification Subsequent)

## 2023-06-05 DIAGNOSIS — L600 Ingrowing nail: Secondary | ICD-10-CM | POA: Diagnosis not present

## 2023-06-06 ENCOUNTER — Encounter: Payer: Medicare PPO | Admitting: Rehabilitative and Restorative Service Providers"

## 2023-06-13 ENCOUNTER — Encounter: Payer: Self-pay | Admitting: Rehabilitative and Restorative Service Providers"

## 2023-06-13 ENCOUNTER — Ambulatory Visit: Payer: Medicare PPO | Admitting: Rehabilitative and Restorative Service Providers"

## 2023-06-13 DIAGNOSIS — G8929 Other chronic pain: Secondary | ICD-10-CM

## 2023-06-13 DIAGNOSIS — R600 Localized edema: Secondary | ICD-10-CM

## 2023-06-13 DIAGNOSIS — M25561 Pain in right knee: Secondary | ICD-10-CM | POA: Diagnosis not present

## 2023-06-13 DIAGNOSIS — M6281 Muscle weakness (generalized): Secondary | ICD-10-CM

## 2023-06-13 DIAGNOSIS — Z1231 Encounter for screening mammogram for malignant neoplasm of breast: Secondary | ICD-10-CM | POA: Diagnosis not present

## 2023-06-13 DIAGNOSIS — R262 Difficulty in walking, not elsewhere classified: Secondary | ICD-10-CM

## 2023-06-13 NOTE — Therapy (Signed)
OUTPATIENT PHYSICAL THERAPY TREATMENT  Patient Name: [name] [name] MRN: [id number] DOB:May 02, 1927, 89 y.o., female Today's Date: 06/13/2023   END OF SESSION:  PT End of Session - 06/13/23 1359     Visit Number 18    Number of Visits 24    Date for PT Re-Evaluation 06/28/23    Authorization Type [employer] $20 copay    Authorization Time Period 12 visits until 8/15    Authorization - Visit Number 6    Authorization - Number of Visits 12    Progress Note Due on Visit 24    PT Start Time 1343    PT Stop Time 1428    PT Time Calculation (min) 45 min    Activity Tolerance Patient tolerated treatment well    Behavior During Therapy [facility] for tasks assessed/performed                    Past Medical History:  Diagnosis Date   Anemia    Anxiety and depression 07/16/2009   ASD (atrial septal defect) per bubble study, 2004    Cardiomyopathy, normal cath 2007, normal EF per ECHO 2014    Hx of colonic polyps    Hypertension    Osteoarthritis    Osteopenia    Podagra 04/29/2014   RLS (restless legs syndrome) 07/10/2009   Past Surgical History:  Procedure Laterality Date   BACK SURGERY  2000   BREAST SURGERY     NEGATIVE BIOPSY   TONSILLECTOMY     TOTAL KNEE ARTHROPLASTY Right 03/22/2023   Procedure: RIGHT TOTAL KNEE ARTHROPLASTY;  Surgeon: Kathryne Hitch, MD;  Location: [facility];  Service: Orthopedics;  Laterality: Right;   Patient Active Problem List   Diagnosis Date Noted   Status post total right knee replacement 03/22/2023   Depression, recurrent (HCC) 02/20/2020   Prediabetes 01/26/2019   History of exercise intolerance 01/26/2019   HLD (hyperlipidemia) 04/07/2018   DJD (degenerative joint disease) 02/09/2010   PVCs (premature ventricular contractions) 07/20/2009   Anxiety and depression 07/16/2009   Cardiomyopathy (HCC) 04/23/2007   HTN (hypertension) 04/15/2007   Osteopenia 04/15/2007    PCP: Bary Leriche PA-C  REFERRING PROVIDER:  Kathryne Hitch, MD  REFERRING DIAG: M17.11 (ICD-10-CM) - Primary osteoarthritis of right knee  THERAPY DIAG:  Chronic pain of right knee  Muscle weakness (generalized)  Difficulty in walking, not elsewhere classified  Localized edema  Rationale for Evaluation and Treatment: Rehabilitation  ONSET DATE: 03/22/2023 Rt TKA  SUBJECTIVE:    SUBJECTIVE STATEMENT: Pt indicated having complaints from in grown toenail on Rt foot.  She indicated having some Lt knee pain but Rt leg has been good.    PERTINENT HISTORY: HTN, OA, osteopenia, history of restless leg syndrome   PAIN:  NPRS scale:  0/10 upon arrival.  Pain location: Rt knee  Pain description: no pain Aggravating factors: static positioning Relieving factors: pain medicine, icing, movement    PRECAUTIONS: None   WEIGHT BEARING RESTRICTIONS: No   FALLS:  Has patient fallen in last 6 months? No   LIVING ENVIRONMENT: Lives with: lives with their family Lives in: House/apartment Stairs: flight of stairs to bedroom, rail on Lt going up.  Entry to house 3 stairs on Lt going up.  Has following equipment at home: FWW, SPC    OCCUPATION: Retired   PLOF: Independent, reading, meditation, watch tv.   Went to church, retirement groups   PATIENT GOALS: Return to activity, reduce pain  OBJECTIVE: (objective measures completed at initial evaluation unless otherwise dated)    PATIENT SURVEYS:  05/16/2023:  FOTO update:  44 Answered with help of daughter  04/05/2023 FOTO intake: 23   predicted:  45   COGNITION: 04/05/2023 Overall cognitive status: WFL                        SENSATION: 04/05/2023 Not tested   EDEMA:  04/05/2023 Localized edema in Rt knee into lower leg.    MUSCLE LENGTH: 04/05/2023 No specific measurements   POSTURE:  04/05/2023:  forward trunk lean, rounded shoulders.    PALPATION: 04/05/2023 General tenderness around Rt knee, incision, guarding in Rt quad   LOWER EXTREMITY ROM:     ROM Right 04/05/2023 Right 04/12/23 Right 04/16/23 Right 04/23/2023 Right 04/30/23 Right 05/16/2023 Right 06/01/2023 Right 06/13/2023  Hip flexion            Hip extension            Hip abduction            Hip adduction            Hip internal rotation            Hip external rotation            Knee flexion 56 AROM in supine heel slide  63* AAROM on Nustep with BUEs, 68* AROM seated at edge of mat table with knee flexion stretch 75 deg AAROM (strap + towel) seated Supine AROM heel slide 65  PROM heel slide 71  Pain limited  PROM  Seated 90 deg Supine heel slide AROM 85  Supine heel slide PROM 90 Supine heel slide AROM 90 Supine heel slide AROM 94  Knee extension -18 AROM in seated LAQ    -17* in knee extension stretch PROM seated   Seated AROM LAQ: -15  Supine AROM quad set -10   PROM  Supine -5 Seated LAQ -12  Supine quad set AROM -6 Supine quad set AROM  -5  Seated LAQ AROM : -8  Ankle dorsiflexion            Ankle plantarflexion            Ankle inversion            Ankle eversion            04/05/2023:  pain noted in end ranges both direction Rt knee   (Blank rows = not tested)   LOWER EXTREMITY MMT:   MMT Right 04/05/2023 Left 04/05/2023 Right 05/16/2023 Right 05/29/2023   Hip flexion  3+/5  5/5     Hip extension         Hip abduction         Hip adduction         Hip internal rotation         Hip external rotation         Knee flexion 3+/5   5/5 4/5    Knee extension  2+/5  5/5 4/5 in available range 4+/5 in available range   Ankle dorsiflexion  4/5 5/5      Ankle plantarflexion         Ankle inversion         Ankle eversion          (Blank rows = not tested)   LOWER EXTREMITY SPECIAL TESTS:  04/05/2023 No specific testing   FUNCTIONAL TESTS:  04/05/2023 18 inch  chair transfer:  UE required, deviation to Lt leg WB  Rt SLS: unable unassisted   GAIT: 05/15/2023:  Ambulation in clinic with [facility] use in Lt UE  05/07/2023:  Ambulation in clinic c SPC in  Lt UE c SBA.   04/05/2023 FWW ambulation c reduced gait speed, step length bilateral, maintained knee flexion in stance lacking TKE.  Hip circumduction in swing to clear foot due to flexion restrictions.                                                                                                                                                                      TODAY'S TREATMENT                                                      DATE  06/13/2023 Therex: UBE LE only 11 mins lvl 2.5  Supine heel slide AROM x 2 Tailgate flexion swinging 2 mins Seated Rt leg LAQ 5 lbs 2 x 15  Seated Rt leg quad set 5 sec hold x 15 Seated Rt leg quad set c SLR 2 x 10   Heel prop stretch to tolerance of vaso time 10 mins   Vasopneumatic Device 10 mins Rt knee medium compression 34 deg in elevation with heel prop first 5 mins  TODAY'S TREATMENT                                                      DATE  06/04/2023 Therex: Nustep lvl 6 4.5 mins , lvl 4 3.5 mins (8 total) UE/LE  Gastroc incline stretch 60 sec x 3 bilaterally with strap overpressure for knee extension on Rt by clinician  Lateral step up/down WB on Rt leg with bilateral hand assist on bar 2 x 10  Seated Rt knee LAQ with flexion/extension end range pauses 1-2 seconds 2 x 15 3 lbs   Heel prop stretch to tolerance of vaso time 10 mins   Neuro Re-ed Tandem stance 1 min x 2 bilateral in // bars with moderate HHA use to correct loss of balance.  SBA  Alternating heel/toe lifts with single hand hold touch down on bar x 20  Vasopneumatic Device 10 mins Rt knee medium compression 34 deg in elevation with heel prop first 5 mins  TODAY'S TREATMENT  DATE  06/01/2023 Therex: UBE LE only , seat 10 lvl 2.5 8 mins  Gastroc incline stretch 30 sec x 3 bilaterally  Step up 6 inch step x 15 bilateral with single hand assist on bar  Leg press double leg 81 lbs x 15, single leg 2 x 15 37 lbs Rt  leg Seated Rt knee LAQ with flexion/extension end range pauses 1-2 seconds x 15  Heel prop stretch to tolerance at start of vaso time 5 mins   Manual Seated Rt knee sustained flexion, contract/relax techniques for mobility gains into flexion.   Neuro Re-ed Tandem stance 1 min x 1 bilateral in // bars with moderate HHA use to correct loss of balance.  Alternating heel/toe lifts with hand hold touch down on bar   Vasopneumatic Device 10 mins Rt knee medium compression 34 deg in elevation with heel prop first 5 mins  TODAY'S TREATMENT                                                      DATE  05/29/2023 Therex: UBE LE only , seat 10 lvl 2.5 8 mins  Leg press double leg 81 lbs x 15, single leg 2 x 15 37 lbs Rt leg Seated Rt knee LAQ with flexion/extension end range pauses 1-2 seconds x 15 Gastroc incline stretch 30 sec x 3 bilaterally  Heel prop stretch to tolerance at start of vaso time 5 mins   Manual Seated Rt knee sustained flexion, contract/relax techniques for mobility gains into flexion.   Neuro Re-ed Tandem stance 1 min x 2 bilateral in // bars with moderate HHA use to correct loss of balance.   Vasopneumatic Device 10 mins Rt knee medium compression 34 deg in elevation with heel prop first 5 mins     PATIENT EDUCATION:  04/19/2023 Education details: HEP, POC, rationale for interventions  Person educated: Patient Education method: Explanation, Demonstration, Verbal cues, and Handouts Education comprehension: verbalized understanding, returned demonstration, and verbal cues required   HOME EXERCISE PROGRAM: Access Code: [email or web address] URL: [email or web address] Date: 04/05/2023 Prepared by: Chyrel Masson  Exercises - Supine Heel Slide  - 3-5 x daily - 7 x weekly - 1 sets - 10 reps - 5 hold - Seated Long Arc Quad  - 3-5 x daily - 7 x weekly - 1 sets - 5-10 reps - 2 hold - Seated Knee Flexion Extension AAROM with Overpressure  - 3-5 x daily - 7 x weekly - 1  sets - 5 reps - 10 hold - Supine Knee Extension Mobilization with Weight  - 3-5 x daily - 7 x weekly - 1 sets - 1 reps - to tolerance up to 15 mins hold - Seated Quad Set  - 3-5 x daily - 7 x weekly - 1 sets - 10 reps - 5 hold   ASSESSMENT:   CLINICAL IMPRESSION: Pt continued to show slow and steady overall improvement in range as noted in testing today.  Pt was able to make gains since last visit c reduced frequency of in clinic time which was a good sign for possible HEP transitioning.   OBJECTIVE IMPAIRMENTS: Abnormal gait, decreased activity tolerance, decreased balance, decreased coordination, decreased endurance, decreased mobility, difficulty walking, decreased ROM, decreased strength, hypomobility, increased edema, increased fascial restrictions, impaired perceived functional ability, increased muscle spasms, impaired flexibility, improper body  mechanics, and pain.    ACTIVITY LIMITATIONS: carrying, lifting, bending, sitting, standing, squatting, sleeping, stairs, transfers, bed mobility, and locomotion level   PARTICIPATION LIMITATIONS: meal prep, cleaning, laundry, interpersonal relationship, shopping, and community activity   PERSONAL FACTORS: Age and HTN, OA, osteopenia, history of restless leg syndrome are also affecting patient's functional outcome.    REHAB POTENTIAL: Good   CLINICAL DECISION MAKING: Stable/uncomplicated   EVALUATION COMPLEXITY: Low     GOALS: Goals reviewed with patient? Yes   SHORT TERM GOALS: (target date for Short term goals are 3 weeks 04/26/2023)    1.  Patient will demonstrate independent use of home exercise program to maintain progress from in clinic treatments.   Goal status: On going 04/30/2023   LONG TERM GOALS: (target dates for all long term goals are 12 weeks  06/28/2023 )   1. Patient will demonstrate/report pain at worst less than or equal to 2/10 to facilitate minimal limitation in daily activity secondary to pain symptoms.   Goal  status: On going 06/13/2023   2. Patient will demonstrate independent use of home exercise program to facilitate ability to maintain/progress functional gains from skilled physical therapy services.   Goal status: On going 06/13/2023   3. Patient will demonstrate FOTO outcome > or = 45 % to indicate reduced disability due to condition.   Goal status: On going 06/13/2023   4.  Patient will demonstrate Rt LE MMT 5/5 throughout to faciltiate usual transfers, stairs, squatting at [facility] for daily life.    Goal status: On going 06/13/2023   5.  Patient will demonstrate Rt knee AROM 0-110 degrees to facilitate mobility for transfers, ambulation, and other daily activity at PLOF.  Goal status: On going 06/13/2023   6.  Patient will demonstrate independent ambulation community distances > 500 ft for community integration.  Goal status: On going 06/13/2023   7.  Patient will demonstrate ascending/descending stairs reciprocally with one hand UE assist for community integration.    Goal Status: On going 06/13/2023     PLAN:   PT FREQUENCY: 1-2x/week   PT DURATION: 12 weeks   PLANNED INTERVENTIONS: Therapeutic exercises, Therapeutic activity, Neuro Muscular re-education, Balance training, Gait training, Patient/Family education, Joint mobilization, Stair training, DME instructions, Dry Needling, Electrical stimulation, Traction, Cryotherapy, vasopneumatic deviceMoist heat, Taping, Ultrasound, Ionotophoresis 4mg /ml Dexamethasone, and aquatic therapy, Manual therapy.  All included unless contraindicated   PLAN FOR NEXT SESSION:  Possible HEP transitioning as able and Pt willing.   Chyrel Masson, PT, DPT, OCS, ATC 06/13/23  2:26 PM     Asking for additional 12 visits, through 06/28/2023  Referring diagnosis? M17.11 (ICD-10-CM) - Primary osteoarthritis of right knee Treatment diagnosis? (if different than referring diagnosis) M25.561 What was this (referring dx) caused by? [x]  Surgery []   Fall []  Ongoing issue []  Arthritis []  Other: ____________  Laterality: [x]  Rt []  Lt []  Both  Check all possible CPT codes:  *CHOOSE 10 OR LESS*    []  97110 (Therapeutic Exercise)  []  46962 (SLP Treatment)  []  97112 (Neuro Re-ed)   []  92526 (Swallowing Treatment)   []  97116 (Gait Training)   []  [id number] (Cognitive Training, 1st 15 minutes) []  97140 (Manual Therapy)   []  97130 (Cognitive Training, each add'l 15 minutes)  []  97164 (Re-evaluation)                              []  Other, List CPT Code ____________  []  97530 (Therapeutic  Activities)     []  97535 (Self Care)   [x]  All codes above (97110 - 97535)  []  97012 (Mechanical Traction)  []  16109 (E-stim Unattended)  [x]  97032 (E-stim manual)  []  97033 (Ionto)  []  97035 (Ultrasound) [x]  97750 (Physical Performance Training) []  [id number] (Aquatic Therapy) [x]  97016 (Vasopneumatic Device) []  [id number] (Paraffin) []  97034 (Contrast Bath) []  97597 (Wound Care 1st 20 sq cm) []  97598 (Wound Care each add'l 20 sq cm) []  97760 (Orthotic Fabrication, Fitting, Training Initial) []  [id number] (Prosthetic Management and Training Initial) []  [id number] (Orthotic or Prosthetic Training/ Modification Subsequent)

## 2023-06-14 ENCOUNTER — Encounter: Payer: Self-pay | Admitting: Physician Assistant

## 2023-06-15 ENCOUNTER — Ambulatory Visit: Payer: Medicare PPO | Admitting: Family Medicine

## 2023-06-15 ENCOUNTER — Encounter: Payer: Medicare PPO | Admitting: Rehabilitative and Restorative Service Providers"

## 2023-06-15 VITALS — BP 102/60 | HR 74 | Temp 98.3°F | Resp 17 | Ht 65.0 in | Wt 143.0 lb

## 2023-06-15 DIAGNOSIS — S21001A Unspecified open wound of right breast, initial encounter: Secondary | ICD-10-CM

## 2023-06-15 MED ORDER — NYSTATIN 100000 UNIT/GM EX POWD: 1.0000 | Freq: Three times a day (TID) | CUTANEOUS | 0 refills | Status: DC

## 2023-06-15 NOTE — Patient Instructions (Addendum)
Follow up as needed or as scheduled Apply Nystatin powder up to 3x/day under R breast Keep area clean and dry- it looks great! Once the area has scabbed, you no longer need a pad Pat dry when needed Call with any questions or concerns Stay Safe!  Stay Healthy! HAPPY EARLY BIRTHDAY!!!

## 2023-06-15 NOTE — Progress Notes (Signed)
   Subjective:    Patient ID: [name] [name], female    DOB: [birth year], 89 y.o.   MRN: [id number]  HPI Skin tear- had a mammogram on Wednesday.  Area under R breast was red before leaving the appt.  Skin was pulled during imaging.  Area has been bleeding.  Denies pain.  Daughter is fearful of infxn over the weekend   Review of Systems For ROS see HPI     Objective:   Physical Exam Vitals reviewed.  Constitutional:      General: She is not in acute distress.    Appearance: Normal appearance. She is not ill-appearing.  Skin:    General: Skin is warm and dry.     Findings: No erythema or rash.     Comments: Linear 3cm raw area under R breast.  No oozing or bleeding.  No surrounding erythema.  Neurological:     Mental Status: She is alert and oriented to person, place, and time.  Psychiatric:        Mood and Affect: Mood normal.        Behavior: Behavior normal.        Thought Content: Thought content normal.           Assessment & Plan:  Wound R breast- new.  Area is currently clean and dry.  Commended pt and daughter for how well they have cared for area.  Given that pt stays 'wet' underneath her breasts, will start nystatin powder to help keep area dry and prevent superimposed yeast.  Reviewed supportive care and red flags that should prompt return.  Pt expressed understanding and is in agreement w/ plan.

## 2023-06-18 ENCOUNTER — Other Ambulatory Visit (HOSPITAL_COMMUNITY): Payer: Self-pay

## 2023-06-18 ENCOUNTER — Telehealth: Payer: Self-pay

## 2023-06-18 ENCOUNTER — Ambulatory Visit: Payer: Medicare PPO | Admitting: Family Medicine

## 2023-06-18 ENCOUNTER — Encounter: Payer: Self-pay | Admitting: Family Medicine

## 2023-06-18 VITALS — BP 116/74 | HR 71 | Temp 97.8°F | Resp 16 | Ht 65.0 in | Wt 143.1 lb

## 2023-06-18 DIAGNOSIS — F3340 Major depressive disorder, recurrent, in remission, unspecified: Secondary | ICD-10-CM | POA: Diagnosis not present

## 2023-06-18 DIAGNOSIS — R21 Rash and other nonspecific skin eruption: Secondary | ICD-10-CM

## 2023-06-18 MED ORDER — CLOTRIMAZOLE-BETAMETHASONE 1-0.05 % EX CREA: 1.0000 | TOPICAL_CREAM | Freq: Every day | CUTANEOUS | 1 refills | Status: DC

## 2023-06-18 NOTE — Telephone Encounter (Signed)
Pharmacy Patient Advocate Encounter  Received notification from [facility] that Prior Authorization for NYSTATIN 100,000 UNIT/GM POWD has been APPROVED from 11/13/2022 to 11/13/2023. Ran test claim, Copay is $10.00  PA #/Case ID/Reference #: [id number]

## 2023-06-18 NOTE — Telephone Encounter (Signed)
Pharmacy Patient Advocate Encounter  Received notification from [facility] that Prior Authorization for Nystatin 100000 UNIT/GM powder has been  SENT TO PLAN FOR REVIEW  PA #/Case ID/Reference #: [id number] KEY: [id number]

## 2023-06-18 NOTE — Progress Notes (Signed)
   Subjective:    Patient ID: [name] [name], female    DOB: [birth year], 89 y.o.   MRN: [id number]  HPI Rash- daughter reports that previous skin tag under R breast has healed well but she now has 2-3 separate lesions under right breast that have just appeared and are uncomfortable.   Review of Systems For ROS see HPI     Objective:   Physical Exam Vitals reviewed.  Constitutional:      General: She is not in acute distress.    Appearance: Normal appearance. She is not ill-appearing.  HENT:     Head: Normocephalic and atraumatic.  Skin:    General: Skin is warm and dry.     Findings: Rash (well demarcated erythemaous lesions w/ raised borders under R breast x3) present.  Neurological:     Mental Status: She is alert and oriented to person, place, and time.  Psychiatric:        Mood and Affect: Mood normal.        Behavior: Behavior normal.        Thought Content: Thought content normal.           Assessment & Plan:  Rash- new.  Area is no longer consistent w/ skin tear but appears to have a fungal component.  Will start Lotrisone cream BID.  Reviewed supportive care and red flags that should prompt return.  Pt expressed understanding and is in agreement w/ plan.

## 2023-06-18 NOTE — Patient Instructions (Signed)
Follow up as needed or as scheduled Apply the cream twice daily and cover w/ a gauze pad Call with any questions or concerns Hang in there!!! HAPPY EARLY BIRTHDAY!!!

## 2023-06-20 ENCOUNTER — Telehealth: Payer: Self-pay | Admitting: *Deleted

## 2023-06-20 ENCOUNTER — Encounter: Payer: Medicare PPO | Admitting: Rehabilitative and Restorative Service Providers"

## 2023-06-20 DIAGNOSIS — N3941 Urge incontinence: Secondary | ICD-10-CM | POA: Diagnosis not present

## 2023-06-20 NOTE — Telephone Encounter (Signed)
Patient requesting refill of pain medication. Norco. Thank you.

## 2023-06-20 NOTE — Progress Notes (Deleted)
Date:  06/20/2023   ID:  [name] [name], [name] [name] 09/05/1927, MRN [id number]  Provider Location: Office  PCP:  Bary Leriche, PA-C  Cardiologist:  Charlton Haws, MD   Electrophysiologist:  None   Evaluation Performed:  Follow-Up Visit  Chief Complaint:  Chronic Systolic CHF  History of Present Illness:    89 y.o. non ischemic DCM normal cath back in 2007 with EF at that time 35%  EF decreased to 25-30% on TTE 12/17/18 and aldactone and entresto added. F/U TTE done 04/24/22  showed improvement to 40-45%   She is swimming at [employer] to finish a memoir about the town of [place]  She is a [employer] grad   Lives with only daughter and has 4 grand children   Recently had injection of right knee for pain and seeing PM&R  Entresto started 12/07/21   Doing well since she is on lasix I told her she could have bananas   She is really struggling with her knee. Wants a 2nd opinion about possible surgery Dr Red Christians would not operate on her ? Due to her age   Past Medical History:  Diagnosis Date   Anemia    Anxiety and depression 07/16/2009   ASD (atrial septal defect) per bubble study, 2004    Cardiomyopathy, normal cath 2007, normal EF per ECHO 2014    Hx of colonic polyps    Hypertension    Osteoarthritis    Osteopenia    Podagra 04/29/2014   RLS (restless legs syndrome) 07/10/2009   Past Surgical History:  Procedure Laterality Date   BACK SURGERY  2000   BREAST SURGERY     NEGATIVE BIOPSY   TONSILLECTOMY     TOTAL KNEE ARTHROPLASTY Right 03/22/2023   Procedure: RIGHT TOTAL KNEE ARTHROPLASTY;  Surgeon: Kathryne Hitch, MD;  Location: [facility];  Service: Orthopedics;  Laterality: Right;     No outpatient medications have been marked as taking for the 06/21/23 encounter (Appointment) with Wendall Stade, MD.     Allergies:   Shellfish allergy, Codeine, and Adhesive [tape]   Social History   Tobacco Use   Smoking status: Never   Smokeless tobacco:  Never  Vaping Use   Vaping status: Never Used  Substance Use Topics   Alcohol use: No    Alcohol/week: 0.0 standard drinks of alcohol   Drug use: No     Family Hx: The patient's family history includes Heart attack in her father; Heart disease in her mother; Hypertension in an other family member. There is no history of Diabetes or Colon cancer.  ROS:   Please see the history of present illness.     All other systems reviewed and are negative.   Prior CV studies:   The following studies were reviewed today:  Echo 12/17/18 Echo 04/21/20   Labs/Other Tests and Data Reviewed:    EKG:   06/20/2023 SR rate 77 nonspecific ST changes   Recent Labs: 02/01/2023: ALT 7; TSH 2.53 03/23/2023: BUN 30; Creatinine, Ser 1.36; Hemoglobin 10.3; Platelets 140; Potassium 4.9; Sodium 136   Recent Lipid Panel Lab Results  Component Value Date/Time   CHOL 215 (H) 02/01/2023 10:37 AM   TRIG 86.0 02/01/2023 10:37 AM   HDL 77.60 02/01/2023 10:37 AM   CHOLHDL 3 02/01/2023 10:37 AM   LDLCALC 120 (H) 02/01/2023 10:37 AM   LDLCALC 103 (H) 08/23/2020 02:26 PM   LDLDIRECT 116.5 11/08/2012 09:35 AM    Wt Readings from Last  3 Encounters:  06/18/23 143 lb 2 oz (64.9 kg)  06/15/23 143 lb (64.9 kg)  03/22/23 138 lb (62.6 kg)     Objective:    Vital Signs:  LMP  (LMP Unknown)  My reading after 15 minutes 138/80 mmHg   Affect appropriate Healthy:  appears stated age HEENT: normal Neck supple with no adenopathy JVP normal no bruits no thyromegaly Lungs clear with no wheezing and good diaphragmatic motion Heart:  S1/S2 no murmur, no rub, gallop or click PMI enlarged  Abdomen: benighn, BS positve, no tenderness, no AAA no bruit.  No HSM or HJR Distal pulses intact with no bruits No edema Neuro non-focal Post right TKR with residual swelling    ASSESSMENT & PLAN:    HTN: improved with entresto   DCM: EF declined echo 12/17/18 25-30% now improved on entresto TTE 04/24/22  40-45% continue medical  Rx   PVC" asymptomatic better with beta blocker   Anxiety/Depression  Stable continue welburin   Ortho:  tricompartmental osteoarthritis with chondrocalcinosis right knee f/u sports medicine has had PT and injections with drainage of effusions Now post right TKR Dr Magnus Ivan May 2024 improving with steroid injection for flexion 05/02/23  UTI:  recurrent after course of cephalosporin and macrobid Per primary consider urology referral UA/culture negative 06/07/22   Medication Adjustments/Labs and Tests Ordered:  None   Tests Ordered:  None  Medication Changes: No orders of the defined types were placed in this encounter.   Disposition:  Follow up in  a year   Signed, Charlton Haws, MD  06/20/2023 12:39 PM    [facility]

## 2023-06-21 ENCOUNTER — Other Ambulatory Visit: Payer: Self-pay | Admitting: Physician Assistant

## 2023-06-21 ENCOUNTER — Ambulatory Visit: Payer: Medicare PPO | Admitting: Cardiovascular Disease

## 2023-06-21 MED ORDER — HYDROCODONE-ACETAMINOPHEN 5-325 MG PO TABS: 1.0000 | ORAL_TABLET | Freq: Four times a day (QID) | ORAL | 0 refills | Status: DC | PRN

## 2023-06-22 ENCOUNTER — Other Ambulatory Visit: Payer: Self-pay | Admitting: Physician Assistant

## 2023-06-22 MED ORDER — HYDROCODONE-ACETAMINOPHEN 10-325 MG PO TABS: 0.5000 | ORAL_TABLET | Freq: Four times a day (QID) | ORAL | 0 refills | Status: DC | PRN

## 2023-06-22 NOTE — Telephone Encounter (Signed)
Made patient aware.

## 2023-06-22 NOTE — Telephone Encounter (Signed)
Patient's daughter called from pharmacy to pick up Norco 5/325mg . All [facility]ies are out of this medication. Pharmacist requested if 10/325 could be ordered instead and they half it. She and her daughter will be very compliant with those instructions. [facility] are both out, would you mind refilling? Thank you.

## 2023-06-25 ENCOUNTER — Encounter: Payer: Self-pay | Admitting: Orthopaedic Surgery

## 2023-06-25 ENCOUNTER — Ambulatory Visit (INDEPENDENT_AMBULATORY_CARE_PROVIDER_SITE_OTHER): Payer: Medicare PPO | Admitting: Orthopaedic Surgery

## 2023-06-25 ENCOUNTER — Ambulatory Visit: Payer: Medicare PPO | Admitting: Rehabilitative and Restorative Service Providers"

## 2023-06-25 ENCOUNTER — Encounter: Payer: Self-pay | Admitting: Rehabilitative and Restorative Service Providers"

## 2023-06-25 DIAGNOSIS — M25561 Pain in right knee: Secondary | ICD-10-CM

## 2023-06-25 DIAGNOSIS — R262 Difficulty in walking, not elsewhere classified: Secondary | ICD-10-CM

## 2023-06-25 DIAGNOSIS — R600 Localized edema: Secondary | ICD-10-CM

## 2023-06-25 DIAGNOSIS — G8929 Other chronic pain: Secondary | ICD-10-CM

## 2023-06-25 DIAGNOSIS — M6281 Muscle weakness (generalized): Secondary | ICD-10-CM

## 2023-06-25 DIAGNOSIS — Z96651 Presence of right artificial knee joint: Secondary | ICD-10-CM

## 2023-06-25 NOTE — Progress Notes (Signed)
The patient is now 58-month status post a right total knee replacement.  She is an active 89 years old and will actually turn 30 in 2 days.  She feels like she is doing great.  Therapy feels like they can release her as well.  On exam her extension is almost full and her flexion is just past 90 degrees.  She is denying any significant pain and is able to mobilize at home without a cane.  She did let me know that she is having active triggering of her left thumb but it is not painful.  I showed her an area of the thumb where I want her to try Voltaren gel twice daily.  I did offer steroid injection but she will wait to see if it starting to bother her worse.  From my standpoint we will see her back in 3 months for her right knee.  An AP and lateral of the right knee can be done at that visit.

## 2023-06-25 NOTE — Therapy (Addendum)
OUTPATIENT PHYSICAL THERAPY TREATMENT  /DISCHARGE  Patient Name: [name] [name] MRN: [id number] DOB:1926-12-25, 89 y.o., female Today's Date: 06/25/2023   END OF SESSION:  PT End of Session - 06/25/23 1056     Visit Number 19    Number of Visits 24    Date for PT Re-Evaluation 06/28/23    Authorization Type [employer] $20 copay    Authorization Time Period 12 visits until 8/15    Authorization - Visit Number 7    Authorization - Number of Visits 12    Progress Note Due on Visit 24    PT Start Time 1056    PT Stop Time 1136    PT Time Calculation (min) 40 min    Activity Tolerance Patient tolerated treatment well    Behavior During Therapy [facility] for tasks assessed/performed                     Past Medical History:  Diagnosis Date   Anemia    Anxiety and depression 07/16/2009   ASD (atrial septal defect) per bubble study, 2004    Cardiomyopathy, normal cath 2007, normal EF per ECHO 2014    Hx of colonic polyps    Hypertension    Osteoarthritis    Osteopenia    Podagra 04/29/2014   RLS (restless legs syndrome) 07/10/2009   Past Surgical History:  Procedure Laterality Date   BACK SURGERY  2000   BREAST SURGERY     NEGATIVE BIOPSY   TONSILLECTOMY     TOTAL KNEE ARTHROPLASTY Right 03/22/2023   Procedure: RIGHT TOTAL KNEE ARTHROPLASTY;  Surgeon: Kathryne Hitch, MD;  Location: [facility];  Service: Orthopedics;  Laterality: Right;   Patient Active Problem List   Diagnosis Date Noted   Status post total right knee replacement 03/22/2023   Depression, recurrent (HCC) 02/20/2020   Prediabetes 01/26/2019   History of exercise intolerance 01/26/2019   HLD (hyperlipidemia) 04/07/2018   DJD (degenerative joint disease) 02/09/2010   PVCs (premature ventricular contractions) 07/20/2009   Anxiety and depression 07/16/2009   Cardiomyopathy (HCC) 04/23/2007   HTN (hypertension) 04/15/2007   Osteopenia 04/15/2007    PCP: Bary Leriche PA-C  REFERRING  PROVIDER: Kathryne Hitch, MD  REFERRING DIAG: M17.11 (ICD-10-CM) - Primary osteoarthritis of right knee  THERAPY DIAG:  Chronic pain of right knee  Muscle weakness (generalized)  Difficulty in walking, not elsewhere classified  Localized edema  Rationale for Evaluation and Treatment: Rehabilitation  ONSET DATE: 03/22/2023 Rt TKA  SUBJECTIVE:    SUBJECTIVE STATEMENT: Pt indicated having every now and then pain but nothing at the moment.  Reported working routinely on exercises at home.  Was open to HEP transitioning.    PERTINENT HISTORY: HTN, OA, osteopenia, history of restless leg syndrome   PAIN:  NPRS scale:  0/10 upon arrival.  Pain location: Rt knee  Pain description: no pain Aggravating factors: static positioning Relieving factors: pain medicine, icing, movement    PRECAUTIONS: None   WEIGHT BEARING RESTRICTIONS: No   FALLS:  Has patient fallen in last 6 months? No   LIVING ENVIRONMENT: Lives with: lives with their family Lives in: House/apartment Stairs: flight of stairs to bedroom, rail on Lt going up.  Entry to house 3 stairs on Lt going up.  Has following equipment at home: FWW, SPC    OCCUPATION: Retired   PLOF: Independent, reading, meditation, watch tv.   Went to church, retirement groups   PATIENT GOALS: Return to activity, reduce  pain     OBJECTIVE: (objective measures completed at initial evaluation unless otherwise dated)    PATIENT SURVEYS:  06/25/2023:  FOTO update 50  05/16/2023:  FOTO update:  44 Answered with help of daughter  04/05/2023 FOTO intake: 23   predicted:  45   COGNITION: 04/05/2023 Overall cognitive status: WFL                        SENSATION: 04/05/2023 Not tested   EDEMA:  04/05/2023 Localized edema in Rt knee into lower leg.    MUSCLE LENGTH: 04/05/2023 No specific measurements   POSTURE:  04/05/2023:  forward trunk lean, rounded shoulders.    PALPATION: 04/05/2023 General tenderness around Rt  knee, incision, guarding in Rt quad   LOWER EXTREMITY ROM:    ROM Right 04/05/2023 Right 04/12/23 Right 04/16/23 Right 04/23/2023 Right 04/30/23 Right 05/16/2023 Right 06/01/2023 Right 06/13/2023 Right 06/25/2023  Hip flexion             Hip extension             Hip abduction             Hip adduction             Hip internal rotation             Hip external rotation             Knee flexion 56 AROM in supine heel slide  63* AAROM on Nustep with BUEs, 68* AROM seated at edge of mat table with knee flexion stretch 75 deg AAROM (strap + towel) seated Supine AROM heel slide 65  PROM heel slide 71  Pain limited  PROM  Seated 90 deg Supine heel slide AROM 85  Supine heel slide PROM 90 Supine heel slide AROM 90 Supine heel slide AROM 94 Supine heel slide AROM 94  Knee extension -18 AROM in seated LAQ    -17* in knee extension stretch PROM seated   Seated AROM LAQ: -15  Supine AROM quad set -10   PROM  Supine -5 Seated LAQ -12  Supine quad set AROM -6 Supine quad set AROM  -5  Seated LAQ AROM : -8 Seated LAQ AROM: -7  Ankle dorsiflexion             Ankle plantarflexion             Ankle inversion             Ankle eversion             04/05/2023:  pain noted in end ranges both direction Rt knee   (Blank rows = not tested)   LOWER EXTREMITY MMT:   MMT Right 04/05/2023 Left 04/05/2023 Right 05/16/2023 Right 05/29/2023 Right 06/25/2023  Hip flexion  3+/5  5/5     Hip extension         Hip abduction         Hip adduction         Hip internal rotation         Hip external rotation         Knee flexion 3+/5   5/5 4/5  5/5  Knee extension  2+/5  5/5 4/5 in available range 4+/5 in available range 5/5 in available range  Ankle dorsiflexion  4/5 5/5      Ankle plantarflexion         Ankle inversion  Ankle eversion          (Blank rows = not tested)   LOWER EXTREMITY SPECIAL TESTS:  04/05/2023 No specific testing   FUNCTIONAL TESTS:  06/25/2023;  Unable to perform  sit to stand from 18 inch chair without UE.  22.5 inch table height without hands c decrease control in descent with bracing of legs noted.   04/05/2023 18 inch chair transfer:  UE required, deviation to Lt leg WB  Rt SLS: unable unassisted   GAIT: 06/25/2023:  Ambulation c SPC to clinic.  Able to perform independent ambulation household distances.    05/15/2023:  Ambulation in clinic with [facility] use in Lt UE  05/07/2023:  Ambulation in clinic c SPC in Lt UE c SBA.   04/05/2023 FWW ambulation c reduced gait speed, step length bilateral, maintained knee flexion in stance lacking TKE.  Hip circumduction in swing to clear foot due to flexion restrictions.                                                                                                                                                                      TODAY'S TREATMENT                                                      DATE  06/25/2023 Therex: UBE LE only 10 mins lvl 2.5  Supine heel slide AROM x 2 Seated Rt leg LAQ 5 lbs 2 x 15  Seated Rt knee flexion AAROM with Lt leg overpressure 15 second x 5  Seated Rt leg quad set c SLR 2 x 10  Sit to stand to sit s UE assist 22.5 inch table height x 15 with slow lowering focus  Heel prop stretch to tolerance of vaso time 10 mins   Vasopneumatic Device 10 mins Rt knee medium compression 34 deg in elevation with heel prop first 5 mins  TODAY'S TREATMENT                                                      DATE  06/13/2023 Therex: UBE LE only 11 mins lvl 2.5  Supine heel slide AROM x 2 T[place]gate flexion swinging 2 mins Seated Rt leg LAQ 5 lbs 2 x 15  Seated Rt leg quad set 5 sec hold x 15 Seated Rt leg quad set c SLR 2 x 10   Heel prop stretch to tolerance of vaso  time 10 mins   Vasopneumatic Device 10 mins Rt knee medium compression 34 deg in elevation with heel prop first 5 mins  TODAY'S TREATMENT                                                      DATE   06/04/2023 Therex: Nustep lvl 6 4.5 mins , lvl 4 3.5 mins (8 total) UE/LE  Gastroc incline stretch 60 sec x 3 bilaterally with strap overpressure for knee extension on Rt by clinician  Lateral step up/down WB on Rt leg with bilateral hand assist on bar 2 x 10  Seated Rt knee LAQ with flexion/extension end range pauses 1-2 seconds 2 x 15 3 lbs   Heel prop stretch to tolerance of vaso time 10 mins   Neuro Re-ed Tandem stance 1 min x 2 bilateral in // bars with moderate HHA use to correct loss of balance.  SBA  Alternating heel/toe lifts with single hand hold touch down on bar x 20  Vasopneumatic Device 10 mins Rt knee medium compression 34 deg in elevation with heel prop first 5 mins  TODAY'S TREATMENT                                                      DATE  06/01/2023 Therex: UBE LE only , seat 10 lvl 2.5 8 mins  Gastroc incline stretch 30 sec x 3 bilaterally  Step up 6 inch step x 15 bilateral with single hand assist on bar  Leg press double leg 81 lbs x 15, single leg 2 x 15 37 lbs Rt leg Seated Rt knee LAQ with flexion/extension end range pauses 1-2 seconds x 15  Heel prop stretch to tolerance at start of vaso time 5 mins   Manual Seated Rt knee sustained flexion, contract/relax techniques for mobility gains into flexion.   Neuro Re-ed Tandem stance 1 min x 1 bilateral in // bars with moderate HHA use to correct loss of balance.  Alternating heel/toe lifts with hand hold touch down on bar   Vasopneumatic Device 10 mins Rt knee medium compression 34 deg in elevation with heel prop first 5 mins   PATIENT EDUCATION:  04/19/2023 Education details: HEP, POC, rationale for interventions  Person educated: Patient Education method: Explanation, Demonstration, Verbal cues, and Handouts Education comprehension: verbalized understanding, returned demonstration, and verbal cues required   HOME EXERCISE PROGRAM: Access Code: [email or web address] URL: [email or web address] Date:  04/05/2023 Prepared by: Chyrel Masson  Exercises - Supine Heel Slide  - 3-5 x daily - 7 x weekly - 1 sets - 10 reps - 5 hold - Seated Long Arc Quad  - 3-5 x daily - 7 x weekly - 1 sets - 5-10 reps - 2 hold - Seated Knee Flexion Extension AAROM with Overpressure  - 3-5 x daily - 7 x weekly - 1 sets - 5 reps - 10 hold - Supine Knee Extension Mobilization with Weight  - 3-5 x daily - 7 x weekly - 1 sets - 1 reps - to tolerance up to 15 mins hold - Seated Quad Set  - 3-5 x daily - 7 x weekly - 1 sets - 10  reps - 5 hold   ASSESSMENT:   CLINICAL IMPRESSION: Pt has attended 19 visits overall reporting good reduction of pain symptoms and improvement in function as noted.  See objective data for updated information.  Pt has continued to show improvements steadily in strength, ROM and overall function.  Ability to ambulate safely in community with [facility] and household distances independently.  At this time, recommendation of trial HEP with patient in agreement.   Pt may continue to make gains in functional strength control and overall mobility.   OBJECTIVE IMPAIRMENTS: Abnormal gait, decreased activity tolerance, decreased balance, decreased coordination, decreased endurance, decreased mobility, difficulty walking, decreased ROM, decreased strength, hypomobility, increased edema, increased fascial restrictions, impaired perceived functional ability, increased muscle spasms, impaired flexibility, improper body mechanics, and pain.    ACTIVITY LIMITATIONS: carrying, lifting, bending, sitting, standing, squatting, sleeping, stairs, transfers, bed mobility, and locomotion level   PARTICIPATION LIMITATIONS: meal prep, cleaning, laundry, interpersonal relationship, shopping, and community activity   PERSONAL FACTORS: Age and HTN, OA, osteopenia, history of restless leg syndrome are also affecting patient's functional outcome.    REHAB POTENTIAL: Good   CLINICAL DECISION MAKING: Stable/uncomplicated    EVALUATION COMPLEXITY: Low     GOALS: Goals reviewed with patient? Yes   SHORT TERM GOALS: (target date for Short term goals are 3 weeks 04/26/2023)    1.  Patient will demonstrate independent use of home exercise program to maintain progress from in clinic treatments.   Goal status: On going 04/30/2023   LONG TERM GOALS: (target dates for all long term goals are 12 weeks  06/28/2023 )   1. Patient will demonstrate/report pain at worst less than or equal to 2/10 to facilitate minimal limitation in daily activity secondary to pain symptoms.   Goal status: Met 06/25/2023   2. Patient will demonstrate independent use of home exercise program to facilitate ability to maintain/progress functional gains from skilled physical therapy services.   Goal status: Met 06/25/2023   3. Patient will demonstrate FOTO outcome > or = 45 % to indicate reduced disability due to condition.   Goal status: Met 06/25/2023   4.  Patient will demonstrate Rt LE MMT 5/5 throughout to faciltiate usual transfers, stairs, squatting at [facility] for daily life.    Goal status: Met for knee 06/25/2023   5.  Patient will demonstrate Rt knee AROM 0-110 degrees to facilitate mobility for transfers, ambulation, and other daily activity at PLOF.  Goal status: partially met 06/25/2023   6.  Patient will demonstrate independent ambulation community distances > 500 ft for community integration.  Goal status: partially met 06/25/2023   7.  Patient will demonstrate ascending/descending stairs reciprocally with one hand UE assist for community integration.    Goal Status: partially met 06/25/2023     PLAN:   PT FREQUENCY: 1-2x/week   PT DURATION: 12 weeks   PLANNED INTERVENTIONS: Therapeutic exercises, Therapeutic activity, Neuro Muscular re-education, Balance training, Gait training, Patient/Family education, Joint mobilization, Stair training, DME instructions, Dry Needling, Electrical stimulation, Traction, Cryotherapy,  vasopneumatic deviceMoist heat, Taping, Ultrasound, Ionotophoresis 4mg /ml Dexamethasone, and aquatic therapy, Manual therapy.  All included unless contraindicated   PLAN FOR NEXT SESSION:  Trial HEP.   Chyrel Masson, PT, DPT, OCS, ATC 06/25/23  11:33 AM    PHYSICAL THERAPY DISCHARGE SUMMARY  Visits from Start of Care: 19  Current functional level related to goals / functional outcomes: See note   Remaining deficits: See note   Education / Equipment: HEP  Patient goals  were  mostly met . Patient is being discharged due to maximized rehab potential.   Chyrel Masson, PT, DPT, OCS, ATC 08/09/23  9:04 AM

## 2023-06-28 ENCOUNTER — Encounter (INDEPENDENT_AMBULATORY_CARE_PROVIDER_SITE_OTHER): Payer: Self-pay

## 2023-06-28 ENCOUNTER — Ambulatory Visit: Payer: Medicare PPO | Admitting: Cardiovascular Disease

## 2023-06-29 ENCOUNTER — Encounter: Payer: Self-pay | Admitting: Family Medicine

## 2023-06-29 ENCOUNTER — Ambulatory Visit: Payer: Medicare PPO | Admitting: Family Medicine

## 2023-06-29 VITALS — BP 164/80 | HR 80 | Temp 98.0°F | Ht 65.0 in | Wt 142.0 lb

## 2023-06-29 DIAGNOSIS — D171 Benign lipomatous neoplasm of skin and subcutaneous tissue of trunk: Secondary | ICD-10-CM | POA: Diagnosis not present
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
## 2023-06-30 ENCOUNTER — Other Ambulatory Visit: Payer: Self-pay | Admitting: Orthopaedic Surgery

## 2023-06-30 ENCOUNTER — Other Ambulatory Visit: Payer: Self-pay | Admitting: Cardiovascular Disease

## 2023-07-03 ENCOUNTER — Encounter: Payer: Self-pay | Admitting: Family Medicine

## 2023-07-03 DIAGNOSIS — H401131 Primary open-angle glaucoma, bilateral, mild stage: Secondary | ICD-10-CM | POA: Diagnosis not present

## 2023-07-18 DIAGNOSIS — H401131 Primary open-angle glaucoma, bilateral, mild stage: Secondary | ICD-10-CM | POA: Diagnosis not present

## 2023-08-02 ENCOUNTER — Telehealth: Payer: Self-pay | Admitting: *Deleted

## 2023-08-02 ENCOUNTER — Other Ambulatory Visit: Payer: Self-pay | Admitting: Orthopaedic Surgery

## 2023-08-02 MED ORDER — HYDROCODONE-ACETAMINOPHEN 10-325 MG PO TABS: 0.5000 | ORAL_TABLET | Freq: Every day | ORAL | 0 refills | Status: DC | PRN

## 2023-08-02 NOTE — Telephone Encounter (Signed)
Patient called and requested 1 additional refill of Norco. Doing well and using at bedtime only. At next visit with you, wants to talk about weaning or what else she can do. Thank you.

## 2023-08-06 ENCOUNTER — Ambulatory Visit: Payer: Medicare PPO | Admitting: Physician Assistant

## 2023-08-09 ENCOUNTER — Encounter: Payer: Self-pay | Admitting: Physician Assistant

## 2023-08-09 ENCOUNTER — Ambulatory Visit: Payer: Medicare PPO | Admitting: Physician Assistant

## 2023-08-09 VITALS — BP 132/76 | HR 66 | Temp 97.5°F | Ht 65.0 in | Wt 141.6 lb

## 2023-08-09 DIAGNOSIS — N1832 Chronic kidney disease, stage 3b: Secondary | ICD-10-CM

## 2023-08-09 DIAGNOSIS — Z23 Encounter for immunization: Secondary | ICD-10-CM

## 2023-08-09 DIAGNOSIS — R718 Other abnormality of red blood cells: Secondary | ICD-10-CM | POA: Diagnosis not present

## 2023-08-09 DIAGNOSIS — E785 Hyperlipidemia, unspecified: Secondary | ICD-10-CM

## 2023-08-09 DIAGNOSIS — R7303 Prediabetes: Secondary | ICD-10-CM | POA: Diagnosis not present

## 2023-08-09 DIAGNOSIS — I10 Essential (primary) hypertension: Secondary | ICD-10-CM | POA: Diagnosis not present

## 2023-08-09 LAB — CBC WITH DIFFERENTIAL/PLATELET
Basophils Absolute: 0.1 10*3/uL (ref 0.0–0.1)
Basophils Relative: 1.2 % (ref 0.0–3.0)
Eosinophils Absolute: 0.2 10*3/uL (ref 0.0–0.7)
Eosinophils Relative: 2.9 % (ref 0.0–5.0)
HCT: 35 % — ABNORMAL LOW (ref 36.0–46.0)
Hemoglobin: 11.6 g/dL — ABNORMAL LOW (ref 12.0–15.0)
Lymphocytes Relative: 21.7 % (ref 12.0–46.0)
Lymphs Abs: 1.3 10*3/uL (ref 0.7–4.0)
MCHC: 33.2 g/dL (ref 30.0–36.0)
MCV: 96.6 fl (ref 78.0–100.0)
Monocytes Absolute: 0.5 10*3/uL (ref 0.1–1.0)
Monocytes Relative: 8.9 % (ref 3.0–12.0)
Neutro Abs: 3.9 10*3/uL (ref 1.4–7.7)
Neutrophils Relative %: 65.3 % (ref 43.0–77.0)
Platelets: 215 10*3/uL (ref 150.0–400.0)
RBC: 3.62 Mil/uL — ABNORMAL LOW (ref 3.87–5.11)
RDW: 14.9 % (ref 11.5–15.5)
WBC: 6 10*3/uL (ref 4.0–10.5)

## 2023-08-09 LAB — COMPREHENSIVE METABOLIC PANEL
ALT: 7 U/L (ref 0–35)
AST: 13 U/L (ref 0–37)
Albumin: 4.1 g/dL (ref 3.5–5.2)
Alkaline Phosphatase: 46 U/L (ref 39–117)
BUN: 30 mg/dL — ABNORMAL HIGH (ref 6–23)
CO2: 25 mEq/L (ref 19–32)
Calcium: 9.7 mg/dL (ref 8.4–10.5)
Chloride: 106 mEq/L (ref 96–112)
Creatinine, Ser: 1.47 mg/dL — ABNORMAL HIGH (ref 0.40–1.20)
GFR: 30.46 mL/min — ABNORMAL LOW (ref >60.00)
Glucose, Bld: 121 mg/dL — ABNORMAL HIGH (ref 70–99)
Potassium: 4.2 mEq/L (ref 3.5–5.1)
Sodium: 139 mEq/L (ref 135–145)
Total Bilirubin: 0.4 mg/dL (ref 0.2–1.2)
Total Protein: 6.8 g/dL (ref 6.0–8.3)

## 2023-08-09 LAB — HEMOGLOBIN A1C: Hgb A1c MFr Bld: 5.9 % (ref 4.6–6.5)

## 2023-08-09 LAB — VITAMIN B12: Vitamin B-12: 470 pg/mL (ref 211–911)

## 2023-08-10 NOTE — Progress Notes (Unsigned)
Date:  08/10/2023   ID:  Trenae, Brunke [birth year], MRN [id number]  Provider Location: Office  PCP:  Bary Leriche, PA-C  Cardiologist:  Charlton Haws, MD   Electrophysiologist:  None   Evaluation Performed:  Follow-Up Visit  Chief Complaint:  Chronic Systolic CHF  History of Present Illness:    89 y.o. non ischemic DCM normal cath back in 2007 with EF at that time 35%  EF decreased to 25-30% on TTE 12/17/18 and aldactone and entresto added. F/U TTE done 04/24/22  showed improvement to 40-45%   She is swimming at [employer] to finish a memoir about the town of [place]  She is a [employer] grad   Lives with only daughter and has 4 grand children   Recently had injection of right knee for pain and seeing PM&R  Entresto started 12/07/21   Doing well since she is on lasix I told her she could have bananas   She had right TKR with Dr Magnus Ivan 03/22/23 She has done well despite her age   ***  Past Medical History:  Diagnosis Date   Anemia    Anxiety and depression 07/16/2009   ASD (atrial septal defect) per bubble study, 2004    Cardiomyopathy, normal cath 2007, normal EF per ECHO 2014    Hx of colonic polyps    Hypertension    Osteoarthritis    Osteopenia    Podagra 04/29/2014   RLS (restless legs syndrome) 07/10/2009   Past Surgical History:  Procedure Laterality Date   BACK SURGERY  2000   BREAST SURGERY     NEGATIVE BIOPSY   TONSILLECTOMY     TOTAL KNEE ARTHROPLASTY Right 03/22/2023   Procedure: RIGHT TOTAL KNEE ARTHROPLASTY;  Surgeon: Kathryne Hitch, MD;  Location: [facility];  Service: Orthopedics;  Laterality: Right;     No outpatient medications have been marked as taking for the 08/15/23 encounter (Appointment) with Wendall Stade, MD.     Allergies:   Shellfish allergy, Codeine, and Adhesive [tape]   Social History   Tobacco Use   Smoking status: Never   Smokeless tobacco: Never  Vaping Use   Vaping status: Never Used   Substance Use Topics   Alcohol use: No    Alcohol/week: 0.0 standard drinks of alcohol   Drug use: No     Family Hx: The patient's family history includes Heart attack in her father; Heart disease in her mother; Hypertension in an other family member. There is no history of Diabetes or Colon cancer.  ROS:   Please see the history of present illness.     All other systems reviewed and are negative.   Prior CV studies:   The following studies were reviewed today:  Echo 12/17/18 Echo 04/21/20   Labs/Other Tests and Data Reviewed:    EKG:   08/10/2023 SR rate 77 nonspecific ST changes   Recent Labs: 02/01/2023: TSH 2.53 08/09/2023: ALT 7; BUN 30; Creatinine, Ser 1.47; Hemoglobin 11.6; Platelets 215.0; Potassium 4.2; Sodium 139   Recent Lipid Panel Lab Results  Component Value Date/Time   CHOL 215 (H) 02/01/2023 10:37 AM   TRIG 86.0 02/01/2023 10:37 AM   HDL 77.60 02/01/2023 10:37 AM   CHOLHDL 3 02/01/2023 10:37 AM   LDLCALC 120 (H) 02/01/2023 10:37 AM   LDLCALC 103 (H) 08/23/2020 02:26 PM   LDLDIRECT 116.5 11/08/2012 09:35 AM    Wt Readings from Last 3 Encounters:  08/09/23 141 lb 9.6 oz (64.2  kg)  06/29/23 142 lb (64.4 kg)  06/18/23 143 lb 2 oz (64.9 kg)     Objective:    Vital Signs:  LMP  (LMP Unknown)  My reading after 15 minutes 138/80 mmHg   Affect appropriate Healthy:  appears stated age HEENT: normal Neck supple with no adenopathy JVP normal no bruits no thyromegaly Lungs clear with no wheezing and good diaphragmatic motion Heart:  S1/S2 no murmur, no rub, gallop or click PMI enlarged  Abdomen: benighn, BS positve, no tenderness, no AAA no bruit.  No HSM or HJR Distal pulses intact with no bruits No edema Neuro non-focal Skin warm and dry Post right TKR    ASSESSMENT & PLAN:    HTN: improved with entresto   DCM: EF declined echo 12/17/18 25-30% now improved on entresto TTE 04/24/22  40-45% continue medical Rx   PVC" asymptomatic better with beta  blocker   Anxiety/Depression  Stable continue welburin   Ortho:  tricompartmental osteoarthritis with chondrocalcinosis Post right TKR with Dr Magnus Ivan 03/22/23   UTI:  recurrent after course of cephalosporin and macrobid Per primary consider urology referral UA/culture negative 06/07/22   Medication Adjustments/Labs and Tests Ordered:  None   Tests Ordered:  None  Medication Changes: No orders of the defined types were placed in this encounter.   Disposition:  Follow up in  a year   Signed, Charlton Haws, MD  08/10/2023 9:53 AM    [facility]

## 2023-08-12 DIAGNOSIS — N1832 Chronic kidney disease, stage 3b: Secondary | ICD-10-CM | POA: Insufficient documentation

## 2023-08-12 NOTE — Assessment & Plan Note (Addendum)
Needs to avoid NSAIDs - need to clarify if taking Celebrex s/p R TKA or not - update labs today - definitely no to Celebrex. Excellent BP control.

## 2023-08-12 NOTE — Assessment & Plan Note (Signed)
Update labs Healthy lifestyle, does great Not on statin

## 2023-08-12 NOTE — Assessment & Plan Note (Signed)
Chronic, controlled, normotensive Coreg 25 mg BID Lasix 20 mg every other day Aldactone 25 mg every day Regular f/up with cardiology as well

## 2023-08-12 NOTE — Assessment & Plan Note (Signed)
Lab Results  Component Value Date   HGBA1C 5.9 08/09/2023   HGBA1C 5.8 02/01/2023   HGBA1C 5.8 (H) 08/23/2020   Stable, diet-lifestyle controlled, does well

## 2023-08-15 ENCOUNTER — Ambulatory Visit: Payer: Medicare PPO | Attending: Cardiovascular Disease | Admitting: Cardiovascular Disease

## 2023-08-15 VITALS — BP 138/40 | HR 80 | Ht 65.0 in | Wt 139.0 lb

## 2023-08-15 DIAGNOSIS — I420 Dilated cardiomyopathy: Secondary | ICD-10-CM

## 2023-08-15 DIAGNOSIS — I10 Essential (primary) hypertension: Secondary | ICD-10-CM

## 2023-08-15 NOTE — Patient Instructions (Signed)
Medication Instructions:  Your physician recommends that you continue on your current medications as directed. Please refer to the Current Medication list given to you today.  *If you need a refill on your cardiac medications before your next appointment, please call your pharmacy*  Lab Work: If you have labs (blood work) drawn today and your tests are completely normal, you will receive your results only by: MyChart Message (if you have MyChart) OR A paper copy in the mail If you have any lab test that is abnormal or we need to change your treatment, we will call you to review the results.  Testing/Procedures: None ordered today.  Follow-Up: At [facility], you and your health needs are our priority.  As part of our continuing mission to provide you with exceptional heart care, we have created designated Provider Care Teams.  These Care Teams include your primary Cardiologist (physician) and Advanced Practice Providers (APPs -  Physician Assistants and Nurse Practitioners) who all work together to provide you with the care you need, when you need it.  We recommend signing up for the patient portal called "MyChart".  Sign up information is provided on this After Visit Summary.  MyChart is used to connect with patients for Virtual Visits (Telemedicine).  Patients are able to view lab/test results, encounter notes, upcoming appointments, etc.  Non-urgent messages can be sent to your provider as well.   To learn more about what you can do with MyChart, go to [email or web address].    Your next appointment:   6 month(s)  Provider:   Peter Nishan, MD      

## 2023-08-23 ENCOUNTER — Encounter: Payer: Self-pay | Admitting: Physician Assistant

## 2023-08-23 ENCOUNTER — Telehealth: Payer: Self-pay

## 2023-08-23 NOTE — Telephone Encounter (Signed)
Called patient to get clarification on a medication she was prescribed by Ortho per PCP; lvm for patient to return my call. Please see PCP msg below.  Allwardt, Crist Infante, PA-C  [facility] She has CKD; please clarify if she is taking Celebrex or not? (Prescribed by her ortho). Strongly advise against this medication and all NSAIDs, otherwise risk acute kidney injury. Thanks

## 2023-08-23 NOTE — Telephone Encounter (Signed)
Patient's daughter returned call. States that she is able to speak with her tomorrow when CMA back in office.

## 2023-08-24 ENCOUNTER — Other Ambulatory Visit: Payer: Self-pay

## 2023-08-24 NOTE — Telephone Encounter (Signed)
Returned [name] [name] call regarding pt prescriptions and was in the store. Pt advised she would have her call me back when she came out.

## 2023-08-24 NOTE — Telephone Encounter (Signed)
Per patient daughter [name] [name] Pt is no longer taking Celebrex. Was taking prior to surgery but no longer takes. Discontinued medication in pt chart per request.

## 2023-08-24 NOTE — Telephone Encounter (Signed)
Noted and agreed, thank you. 

## 2023-08-27 ENCOUNTER — Other Ambulatory Visit: Payer: Self-pay | Admitting: Physician Assistant

## 2023-08-27 DIAGNOSIS — I10 Essential (primary) hypertension: Secondary | ICD-10-CM

## 2023-08-30 ENCOUNTER — Other Ambulatory Visit: Payer: Self-pay | Admitting: Orthopaedic Surgery

## 2023-08-30 DIAGNOSIS — N952 Postmenopausal atrophic vaginitis: Secondary | ICD-10-CM | POA: Diagnosis not present

## 2023-08-30 DIAGNOSIS — R351 Nocturia: Secondary | ICD-10-CM | POA: Diagnosis not present

## 2023-08-30 DIAGNOSIS — N3941 Urge incontinence: Secondary | ICD-10-CM | POA: Diagnosis not present

## 2023-09-26 ENCOUNTER — Ambulatory Visit: Payer: Medicare PPO | Admitting: Orthopaedic Surgery

## 2023-09-26 ENCOUNTER — Other Ambulatory Visit (INDEPENDENT_AMBULATORY_CARE_PROVIDER_SITE_OTHER): Payer: Self-pay

## 2023-09-26 ENCOUNTER — Encounter: Payer: Self-pay | Admitting: Orthopaedic Surgery

## 2023-09-26 DIAGNOSIS — Z96651 Presence of right artificial knee joint: Secondary | ICD-10-CM

## 2023-09-26 NOTE — Progress Notes (Signed)
The patient is now 6 months status post a right knee replacement.  She is 89 years old.  She had debilitating end-stage arthritis of her right knee that was so badly affecting her quality of life and her mobility that it was appropriate to proceed with knee replacement surgery and she received appropriate clearance.  She is now walking without assistive device and says she has good range of motion and strength and is doing well.  She is with her daughter today.  She says she works on her knee on a daily basis in terms of working on motion and trying to exercise.  She is back in a regular bed as well.  She is very satisfied.  On exam her right knee has full extension and I can flex her to just barely past 90 degrees.  She is satisfied with this.  She understands that to get further flexion at this point I would have to open the knee up to remove scar tissue and change out the liner but overall they are very satisfied.  2 views of the right knee show well-seated total knee arthroplasty with no complicating features.  At this point follow-up can be as needed since she is doing so well.  If she does develop any issues at all they know to let us know.

## 2023-10-19 DIAGNOSIS — H401131 Primary open-angle glaucoma, bilateral, mild stage: Secondary | ICD-10-CM | POA: Diagnosis not present

## 2023-11-08 ENCOUNTER — Other Ambulatory Visit: Payer: Self-pay | Admitting: Family Medicine

## 2023-11-08 NOTE — Telephone Encounter (Signed)
Last OV 12/07/22 Next OV not scheduled   Last refill 06/08/22 Qty # 1000 g / 11

## 2023-11-08 NOTE — Telephone Encounter (Signed)
Rx refilled, pt needs OV before next refill.

## 2023-11-12 ENCOUNTER — Other Ambulatory Visit (INDEPENDENT_AMBULATORY_CARE_PROVIDER_SITE_OTHER): Payer: Self-pay

## 2023-11-12 ENCOUNTER — Ambulatory Visit: Payer: Medicare PPO | Admitting: Orthopaedic Surgery

## 2023-11-12 DIAGNOSIS — M25551 Pain in right hip: Secondary | ICD-10-CM

## 2023-11-12 NOTE — Progress Notes (Signed)
The patient is a 89 year old female well-known to [place].  We replaced her right knee secondary to severe valgus malalignment and severe arthritis back in May of this year.  That has done great for her.  The day before Christmas last week her daughter fell off a stepstool and she ran a sugar daughter out and said some pain in her right thigh and hip since then.  She has been ambulate with a cane in her opposite hand which is appropriate.  She does feel like she is getting better each day.  Her right hip moves smoothly and fluidly.  Her right operative knee also is stable and moves smoothly.  Overall she looks good.  I do not see any worrisome findings on clinical exam.  An AP pelvis and lateral of the right hip shows no acute findings.  The joint spaces well-maintained and there are no irregularities around the hip.  I gave her reassurance that she is getting better and she continues to get better the further she gets out from this injury.  She likely either pulled a muscle or strained a muscle.  This gave her reassurance.  If there are any issues or this is not getting better she knows to reach out to [place].

## 2023-11-27 ENCOUNTER — Other Ambulatory Visit: Payer: Self-pay | Admitting: Physician Assistant

## 2023-11-27 DIAGNOSIS — I10 Essential (primary) hypertension: Secondary | ICD-10-CM

## 2023-12-24 ENCOUNTER — Other Ambulatory Visit: Payer: Self-pay | Admitting: Cardiovascular Disease

## 2024-01-09 NOTE — Progress Notes (Unsigned)
 Date:  01/21/2024   ID:  [name] [name], [name] [name] Apr 20, 1927, MRN [id number]  Provider Location: Office  PCP:  Bary Leriche, PA-C  Cardiologist:  Charlton Haws, MD   Electrophysiologist:  None   Evaluation Performed:  Follow-Up Visit  Chief Complaint:  Chronic Systolic CHF  History of Present Illness:    89 y.o. non ischemic DCM normal cath back in 2007 with EF at that time 35%  EF decreased to 25-30% on TTE 12/17/18 and aldactone and entresto added. F/U TTE done 04/24/22  showed improvement to 40-45%   She is swimming at [employer] to finish a memoir about the town of [place]  She is a [employer] grad   Lives with only daughter and has 4 grand children   She had right TKR with Dr Magnus Ivan 03/22/23 She has done well despite her age       Past Medical History:  Diagnosis Date   Anemia    Anxiety and depression 07/16/2009   ASD (atrial septal defect) per bubble study, 2004    Cardiomyopathy, normal cath 2007, normal EF per ECHO 2014    Hx of colonic polyps    Hypertension    Osteoarthritis    Osteopenia    Podagra 04/29/2014   RLS (restless legs syndrome) 07/10/2009   Past Surgical History:  Procedure Laterality Date   BACK SURGERY  2000   BREAST SURGERY     NEGATIVE BIOPSY   TONSILLECTOMY     TOTAL KNEE ARTHROPLASTY Right 03/22/2023   Procedure: RIGHT TOTAL KNEE ARTHROPLASTY;  Surgeon: Kathryne Hitch, MD;  Location: [facility];  Service: Orthopedics;  Laterality: Right;     Current Meds  Medication Sig   AMBULATORY NON FORMULARY MEDICATION Raised toilet seat Dispense 1 Dx code: M17.11 Use as needed   buPROPion (WELLBUTRIN XL) 150 MG 24 hr tablet Take 150 mg by mouth every morning.   carvedilol (COREG) 25 MG tablet TAKE 1 TABLET BY MOUTH TWICE A DAY WITH FOOD   furosemide (LASIX) 20 MG tablet TAKE 1 TABLET BY MOUTH EVERY OTHER DAY   mirtazapine (REMERON) 30 MG tablet Take 30 mg by mouth at bedtime.    Multiple Vitamin (MULTIVITAMIN) tablet  Take 1 tablet by mouth every morning.   Multiple Vitamins-Minerals (BODY/HAIR/SKIN/NAILS) CAPS Take 2 capsules by mouth daily.   polyethylene glycol (MIRALAX / GLYCOLAX) packet Take 17 g by mouth every Monday, Wednesday, and Friday.   ROCKLATAN 0.02-0.005 % SOLN Apply to eye.   sacubitril-valsartan (ENTRESTO) 97-103 MG TAKE 1 TABLET BY MOUTH TWICE A DAY   SPIKEVAX syringe    spironolactone (ALDACTONE) 25 MG tablet TAKE 1 TABLET BY MOUTH EVERY OTHER DAY     Allergies:   Shellfish allergy, Codeine, and Adhesive [tape]   Social History   Tobacco Use   Smoking status: Never   Smokeless tobacco: Never  Vaping Use   Vaping status: Never Used  Substance Use Topics   Alcohol use: No    Alcohol/week: 0.0 standard drinks of alcohol   Drug use: No     Family Hx: The patient's family history includes Heart attack in her father; Heart disease in her mother; Hypertension in an other family member. There is no history of Diabetes or Colon cancer.  ROS:   Please see the history of present illness.     All other systems reviewed and are negative.   Prior CV studies:   The following studies were reviewed today:  Echo 12/17/18 Echo  04/21/20   Labs/Other Tests and Data Reviewed:    EKG:   01/21/2024 SR rate 77 nonspecific ST changes   Recent Labs: 02/01/2023: TSH 2.53 08/09/2023: ALT 7; BUN 30; Creatinine, Ser 1.47; Hemoglobin 11.6; Platelets 215.0; Potassium 4.2; Sodium 139   Recent Lipid Panel Lab Results  Component Value Date/Time   CHOL 215 (H) 02/01/2023 10:37 AM   TRIG 86.0 02/01/2023 10:37 AM   HDL 77.60 02/01/2023 10:37 AM   CHOLHDL 3 02/01/2023 10:37 AM   LDLCALC 120 (H) 02/01/2023 10:37 AM   LDLCALC 103 (H) 08/23/2020 02:26 PM   LDLDIRECT 116.5 11/08/2012 09:35 AM    Wt Readings from Last 3 Encounters:  01/21/24 151 lb (68.5 kg)  01/17/24 149 lb 6.4 oz (67.8 kg)  08/15/23 139 lb (63 kg)     Objective:    Vital Signs:  BP (!) 158/68 (BP Location: Left Arm, Patient  Position: Sitting, Cuff Size: Normal)   Pulse 78   Ht 5\' 5"  (1.651 m)   Wt 151 lb (68.5 kg)   LMP  (LMP Unknown)   SpO2 96%   BMI 25.13 kg/m  My reading after 15 minutes 138/80 mmHg   Affect appropriate Healthy:  appears stated age HEENT: normal Neck supple with no adenopathy JVP normal no bruits no thyromegaly Lungs clear with no wheezing and good diaphragmatic motion Heart:  S1/S2 no murmur, no rub, gallop or click PMI enlarged  Abdomen: benighn, BS positve, no tenderness, no AAA no bruit.  No HSM or HJR Distal pulses intact with no bruits No edema Neuro non-focal Skin warm and dry Post right TKR    ASSESSMENT & PLAN:    HTN: improved with entresto   DCM: EF declined echo 12/17/18 25-30% now improved on entresto TTE 04/24/22  40-45% continue medical Rx update TTE   PVC" asymptomatic better with beta blocker   Anxiety/Depression  Stable continue welburin   Ortho:  tricompartmental osteoarthritis with chondrocalcinosis Post right TKR with Dr Magnus Ivan 03/22/23    Medication Adjustments/Labs and Tests Ordered:  None   Tests Ordered:  TTE  Medication Changes: No orders of the defined types were placed in this encounter.   Disposition:  Follow up in  a year   Signed, Charlton Haws, MD  01/21/2024 11:35 AM     [facility]

## 2024-01-17 ENCOUNTER — Ambulatory Visit: Payer: Medicare PPO

## 2024-01-17 VITALS — BP 138/74 | Temp 97.2°F | Ht 65.0 in | Wt 149.4 lb

## 2024-01-17 DIAGNOSIS — Z0000 Encounter for general adult medical examination without abnormal findings: Secondary | ICD-10-CM | POA: Diagnosis not present

## 2024-01-17 NOTE — Patient Instructions (Signed)
 [name] [name] , Thank you for taking time to come for your Medicare Wellness Visit. I appreciate your ongoing commitment to your health goals. Please review the following plan we discussed and let me know if I can assist you in the future.   Referrals/Orders/Follow-Ups/Clinician Recommendations: maintain health and activity   This is a list of the screening recommended for you and due dates:  Health Maintenance  Topic Date Due   Mammogram  06/12/2024   Medicare Annual Wellness Visit  01/16/2025   DTaP/Tdap/Td vaccine (3 - Tdap) 03/21/2027   Pneumonia Vaccine  Completed   Flu Shot  Completed   DEXA scan (bone density measurement)  Completed   Zoster (Shingles) Vaccine  Completed   HPV Vaccine  Aged Out   COVID-19 Vaccine  Discontinued    Advanced directives: (In Chart) A copy of your advanced directives are scanned into your chart should your provider ever need it.  Next Medicare Annual Wellness Visit scheduled for next year: Yes

## 2024-01-17 NOTE — Progress Notes (Addendum)
 Subjective:   [name] [name] is a 89 y.o. who presents for a Medicare Wellness preventive visit.  Visit Complete: In person  VideoDeclined- This patient declined Interactive audio and Acupuncturist. Therefore the visit was completed with audio only.  AWV Questionnaire: No: Patient Medicare AWV questionnaire was not completed prior to this visit.  Cardiac Risk Factors include: advanced age (>57men, >63 women);dyslipidemia;hypertension     Objective:    Today's Vitals   01/17/24 1050  BP: 138/74  Temp: (!) 97.2 F (36.2 C)  SpO2: 92%  Weight: 149 lb 6.4 oz (67.8 kg)  Height: 5\' 5"  (1.651 m)   Body mass index is 24.86 kg/m.     01/17/2024   10:54 AM 04/05/2023    3:56 PM 03/22/2023    2:25 PM 03/22/2023    8:32 AM 03/12/2023   12:59 PM 01/04/2023   11:29 AM 12/22/2021   11:45 AM  Advanced Directives  Does Patient Have a Medical Advance Directive? Yes Yes Yes Yes Yes Yes Yes  Type of Estate agent of [place];Living will Healthcare Power of [place];Living will  Healthcare Power of [place];Living will Healthcare Power of [place];Living will Healthcare Power of [place];Living will Healthcare Power of Attorney  Does patient want to make changes to medical advance directive? No - Patient declined No - Patient declined No - Patient declined   No - Patient declined   Copy of Healthcare Power of Attorney in Chart? Yes - validated most recent copy scanned in chart (See row information)     Yes - validated most recent copy scanned in chart (See row information) Yes - validated most recent copy scanned in chart (See row information)    Current Medications (verified) Outpatient Encounter Medications as of 01/17/2024  Medication Sig   ABRYSVO 120 MCG/0.5ML injection    AMBULATORY NON FORMULARY MEDICATION Raised toilet seat Dispense 1 Dx code: M17.11 Use as needed   buPROPion (WELLBUTRIN XL) 150 MG 24 hr tablet Take 150 mg by mouth every morning.    carvedilol (COREG) 25 MG tablet TAKE 1 TABLET BY MOUTH TWICE A DAY WITH FOOD   clotrimazole-betamethasone (LOTRISONE) cream Apply 1 Application topically daily. (Patient taking differently: Apply 1 Application topically daily. PRN)   diclofenac Sodium (VOLTAREN) 1 % GEL APPLY 4 GRAMS 4 TIMES DAILY. TO AFFECTED JOINT.   furosemide (LASIX) 20 MG tablet TAKE 1 TABLET BY MOUTH EVERY OTHER DAY   mirtazapine (REMERON) 30 MG tablet Take 30 mg by mouth at bedtime.    Multiple Vitamin (MULTIVITAMIN) tablet Take 1 tablet by mouth every morning.   Multiple Vitamins-Minerals (BODY/HAIR/SKIN/NAILS) CAPS Take 2 capsules by mouth daily.   nystatin (MYCOSTATIN/NYSTOP) powder Apply 1 Application topically 3 (three) times daily. (Patient taking differently: Apply 1 Application topically 3 (three) times daily. PRN)   polyethylene glycol (MIRALAX / GLYCOLAX) packet Take 17 g by mouth every Monday, Wednesday, and Friday.   ROCKLATAN 0.02-0.005 % SOLN Apply to eye.   sacubitril-valsartan (ENTRESTO) 97-103 MG TAKE 1 TABLET BY MOUTH TWICE A DAY   SPIKEVAX syringe    spironolactone (ALDACTONE) 25 MG tablet TAKE 1 TABLET BY MOUTH EVERY OTHER DAY   [DISCONTINUED] celecoxib (CELEBREX) 200 MG capsule TAKE 1 CAPSULE (200 MG TOTAL) BY MOUTH 2 (TWO) TIMES DAILY BETWEEN MEALS AS NEEDED.   [DISCONTINUED] HYDROcodone-acetaminophen (NORCO) 10-325 MG tablet Take 0.5 tablets by mouth daily as needed for moderate pain.   No facility-administered encounter medications on file as of 01/17/2024.    Allergies (verified)  Shellfish allergy, Codeine, and Adhesive [tape]   History: Past Medical History:  Diagnosis Date   Anemia    Anxiety and depression 07/16/2009   ASD (atrial septal defect) per bubble study, 2004    Cardiomyopathy, normal cath 2007, normal EF per ECHO 2014    Hx of colonic polyps    Hypertension    Osteoarthritis    Osteopenia    Podagra 04/29/2014   RLS (restless legs syndrome) 07/10/2009   Past Surgical  History:  Procedure Laterality Date   BACK SURGERY  2000   BREAST SURGERY     NEGATIVE BIOPSY   TONSILLECTOMY     TOTAL KNEE ARTHROPLASTY Right 03/22/2023   Procedure: RIGHT TOTAL KNEE ARTHROPLASTY;  Surgeon: Kathryne Hitch, MD;  Location: [facility];  Service: Orthopedics;  Laterality: Right;   Family History  Problem Relation Age of Onset   Heart attack Father    Heart disease Mother    Hypertension Other    Diabetes Neg Hx    Colon cancer Neg Hx    Social History   Socioeconomic History   Marital status: Widowed    Spouse name: Not on file   Number of children: 1   Years of education: Not on file   Highest education level: Master's degree (e.g., MA, MS, MEng, MEd, MSW, MBA)  Occupational History   Occupation: Wellsite geologist: RETIRED  Tobacco Use   Smoking status: Never   Smokeless tobacco: Never  Vaping Use   Vaping status: Never Used  Substance and Sexual Activity   Alcohol use: No    Alcohol/week: 0.0 standard drinks of alcohol   Drug use: No   Sexual activity: Not Currently  Other Topics Concern   Not on file  Social History Narrative   Widow, moved to [place] 2007. Has a daughter Vikki Ports and a G-d in [place]; lost her son-in-law, moved w/ daughter 2014 due to depression.   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[place], [place]    Social Drivers of Health   Financial Resource Strain: Low Risk  (01/17/2024)   Overall Financial Resource Strain (CARDIA)    Difficulty of Paying Living Expenses: Not hard at all  Food Insecurity: No Food Insecurity (01/17/2024)   Hunger Vital Sign    Worried About Running Out of Food in the Last Year: Never true    Ran Out of Food in the Last Year: Never true  Transportation Needs: No Transportation Needs (01/17/2024)   PRAPARE - Administrator, Civil Service (Medical): No    Lack of Transportation (Non-Medical): No  Physical Activity: Sufficiently Active (01/17/2024)   Exercise Vital Sign    Days of Exercise per Week:  5 days    Minutes of Exercise per Session: 30 min  Stress: No Stress Concern Present (01/17/2024)   [employer] of Occupational Health - Occupational Stress Questionnaire    Feeling of Stress : Not at all  Social Connections: Moderately Integrated (01/17/2024)   Social Connection and Isolation Panel [NHANES]    Frequency of Communication with Friends and Family: More than three times a week    Frequency of Social Gatherings with Friends and Family: More than three times a week    Attends Religious Services: More than 4 times per year    Active Member of [employer] or Organizations: Yes    Attends Banker Meetings: 1 to 4 times per year    Marital Status: Widowed    Tobacco Counseling Counseling  given: Not Answered    Clinical Intake:  Pre-visit preparation completed: Yes  Pain : No/denies pain     BMI - recorded: 24.86 Nutritional Status: BMI of 19-24  Normal Nutritional Risks: None Diabetes: No  How often do you need to have someone help you when you read instructions, pamphlets, or other written materials from your doctor or pharmacy?: 1 - Never  Interpreter Needed?: No  Information entered by :: Lanier Ensign , LPN   Activities of Daily Living     01/17/2024   10:49 AM 03/22/2023    2:25 PM  In your present state of health, do you have any difficulty performing the following activities:  Hearing? 1 0  Comment hearing iads   Vision? 0 0  Difficulty concentrating or making decisions? 0 0  Walking or climbing stairs? 1 1  Comment take time on stairs   Dressing or bathing? 0 0  Doing errands, shopping? 0 0  Preparing Food and eating ? N   Using the Toilet? N   In the past six months, have you accidently leaked urine? N   Do you have problems with loss of bowel control? N   Managing your Medications? N   Managing your Finances? N   Housekeeping or managing your Housekeeping? N     Patient Care Team: Allwardt, Crist Infante, PA-C as PCP - General  (Physician Assistant) Wendall Stade, MD as PCP - Cardiology (Cardiology) Archer Asa, MD as Consulting Physician (Psychiatry) Wendall Stade, MD as Consulting Physician (Cardiology) Davina Poke as Consulting Physician (Optometry)  Indicate any recent Medical Services you may have received from other than [facility] providers in the past year (date may be approximate).     Assessment:   This is a routine wellness examination for [name].  Hearing/Vision screen Hearing Screening - Comments:: Pt has hearing aids  Vision Screening - Comments:: Pt follows up with Dr London Sheer for annual eye exams    Goals Addressed             This Visit's Progress    Patient Stated       Maintain health and activity        Depression Screen     01/17/2024   10:57 AM 08/09/2023   11:43 AM 06/29/2023   11:13 AM 06/15/2023    1:29 PM 01/04/2023   11:26 AM 12/22/2021   12:05 PM 05/03/2021    2:26 PM  PHQ 2/9 Scores  PHQ - 2 Score 0 0 0 0 0 0 0  PHQ- 9 Score  0 0 0       Fall Risk     01/17/2024   10:58 AM 08/09/2023   11:43 AM 06/29/2023   11:13 AM 06/15/2023    1:30 PM 02/01/2023   10:05 AM  Fall Risk   Falls in the past year? 0 0 0 0 0  Number falls in past yr: 0 0 0 0 0  Injury with Fall? 0 0 0 0 0  Risk for fall due to : No Fall Risks No Fall Risks No Fall Risks No Fall Risks Impaired balance/gait  Follow up Falls prevention discussed Falls evaluation completed Falls evaluation completed Falls evaluation completed Falls evaluation completed    MEDICARE RISK AT HOME:  Medicare Risk at Home Any stairs in or around the home?: Yes If so, are there any without handrails?: No Home free of loose throw rugs in walkways, pet beds, electrical cords, etc?: Yes Adequate lighting  in your home to reduce risk of falls?: Yes Life alert?: Yes Use of a cane, walker or w/c?: No Grab bars in the bathroom?: Yes Shower chair or bench in shower?: Yes Elevated toilet seat or a handicapped toilet?:  Yes  TIMED UP AND GO:  Was the test performed?  Yes  Length of time to ambulate 10 feet: 15 sec Gait steady and fast without use of assistive device  Cognitive Function: 6CIT completed        01/17/2024   10:59 AM 01/04/2023   11:36 AM 12/22/2021   12:11 PM 12/16/2020   11:35 AM 11/21/2019    2:03 PM  6CIT Screen  What Year? 0 points 0 points 0 points 0 points 0 points  What month? 0 points 0 points 0 points 0 points 0 points  What time? 0 points 0 points 0 points  0 points  Count back from 20 0 points 0 points 0 points 0 points 0 points  Months in reverse 0 points 0 points 0 points 0 points 0 points  Repeat phrase 2 points 6 points 0 points 0 points 0 points  Total Score 2 points 6 points 0 points  0 points    Immunizations Immunization History  Administered Date(s) Administered   Fluad Quad(high Dose 65+) 09/13/2020   Fluad Trivalent(High Dose 65+) 08/09/2023   Influenza Split 09/04/2011, 09/02/2012, 07/18/2015   Influenza Whole 10/21/2007, 08/03/2008, 08/12/2009, 08/03/2010   Influenza, High Dose Seasonal PF 07/18/2016, 08/08/2017, 07/08/2018, 08/05/2019, 09/13/2020, 08/21/2022   Influenza, Seasonal, Injecte, Preservative Fre 09/26/1999, 09/05/2001, 08/19/2003   Influenza,inj,Quad PF,6+ Mos 09/11/2013, 08/15/2014   Influenza-Unspecified 08/09/2017, 07/08/2018, 09/08/2021, 08/21/2022   Novel Infuenza-h1n1-09 10/30/2008   PFIZER Comirnaty(Gray Top)Covid-19 Tri-Sucrose Vaccine 03/03/2021   PFIZER(Purple Top)SARS-COV-2 Vaccination 12/25/2019, 01/19/2020, 08/21/2020   Pfizer Covid-19 Vaccine Bivalent Booster 47yrs & up 08/23/2021, 05/11/2022   Pfizer Covid-19 Vaccine Bivalent Booster 5y-11y 08/23/2021, 05/11/2022, 09/08/2022   Pfizer(Comirnaty)Fall Seasonal Vaccine 12 years and older 09/08/2022   Pneumococcal Conjugate-13 03/10/2014   Pneumococcal Polysaccharide-23 12/11/2006, 10/02/2017   Rsv, Bivalent, Protein Subunit Rsvpref,pf (Abrysvo) 10/25/2023   Td 12/22/2006, 03/20/2017    Zoster Recombinant(Shingrix) 01/26/2018, 06/10/2018   Zoster, Live 03/09/2008    Screening Tests Health Maintenance  Topic Date Due   MAMMOGRAM  06/12/2024   Medicare Annual Wellness (AWV)  01/16/2025   DTaP/Tdap/Td (3 - Tdap) 03/21/2027   Pneumonia Vaccine 6+ Years old  Completed   INFLUENZA VACCINE  Completed   DEXA SCAN  Completed   Zoster Vaccines- Shingrix  Completed   HPV VACCINES  Aged Out   COVID-19 Vaccine  Discontinued    Health Maintenance  There are no preventive care reminders to display for this patient.  Health Maintenance Items Addressed: See Nurse Notes  Additional Screening:  Vision Screening: Recommended annual ophthalmology exams for early detection of glaucoma and other disorders of the eye.  Dental Screening: Recommended annual dental exams for proper oral hygiene  Community Resource Referral / Chronic Care Management: CRR required this visit?  No   CCM required this visit?  No     Plan:     I have personally reviewed and noted the following in the patient's chart:   Medical and social history Use of alcohol, tobacco or illicit drugs  Current medications and supplements including opioid prescriptions. Patient is not currently taking opioid prescriptions. Functional ability and status Nutritional status Physical activity Advanced directives List of other physicians Hospitalizations, surgeries, and ER visits in previous 12 months Vitals Screenings to include  cognitive, depression, and falls Referrals and appointments  In addition, I have reviewed and discussed with patient certain preventive protocols, quality metrics, and best practice recommendations. A written personalized care plan for preventive services as well as general preventive health recommendations were provided to patient.     Marzella Schlein, LPN   06/13/1911   After Visit Summary: (In Person-Printed) AVS printed and given to the patient  Notes: Pt was able to  answer questions with knowledge unable to recall address during 6 CIT.

## 2024-01-21 ENCOUNTER — Encounter: Payer: Self-pay | Admitting: Cardiovascular Disease

## 2024-01-21 ENCOUNTER — Ambulatory Visit: Payer: Medicare PPO | Attending: Cardiovascular Disease | Admitting: Cardiovascular Disease

## 2024-01-21 VITALS — BP 158/68 | HR 78 | Ht 65.0 in | Wt 151.0 lb

## 2024-01-21 DIAGNOSIS — I10 Essential (primary) hypertension: Secondary | ICD-10-CM

## 2024-01-21 DIAGNOSIS — I420 Dilated cardiomyopathy: Secondary | ICD-10-CM | POA: Diagnosis not present

## 2024-01-21 DIAGNOSIS — M25561 Pain in right knee: Secondary | ICD-10-CM | POA: Diagnosis not present

## 2024-01-21 DIAGNOSIS — G8929 Other chronic pain: Secondary | ICD-10-CM | POA: Diagnosis not present

## 2024-01-21 NOTE — Patient Instructions (Addendum)
 Medication Instructions:  Your physician recommends that you continue on your current medications as directed. Please refer to the Current Medication list given to you today.  *If you need a refill on your cardiac medications before your next appointment, please call your pharmacy*  Lab Work: If you have labs (blood work) drawn today and your tests are completely normal, you will receive your results only by: MyChart Message (if you have MyChart) OR A paper copy in the mail If you have any lab test that is abnormal or we need to change your treatment, we will call you to review the results.  Testing/Procedures: Your physician has requested that you have an echocardiogram. Echocardiography is a painless test that uses sound waves to create images of your heart. It provides your doctor with information about the size and shape of your heart and how well your heart's chambers and valves are working. This procedure takes approximately one hour. There are no restrictions for this procedure. Please do NOT wear cologne, perfume, aftershave, or lotions (deodorant is allowed). Please arrive 15 minutes prior to your appointment time.  Please note: We ask at that you not bring children with you during ultrasound (echo/ vascular) testing. Due to room size and safety concerns, children are not allowed in the ultrasound rooms during exams. Our front office staff cannot provide observation of children in our lobby area while testing is being conducted. An adult accompanying a patient to their appointment will only be allowed in the ultrasound room at the discretion of the ultrasound technician under special circumstances. We apologize for any inconvenience. Follow-Up: At [facility], you and your health needs are our priority.  As part of our continuing mission to provide you with exceptional heart care, we have created designated Provider Care Teams.  These Care Teams include your primary Cardiologist  (physician) and Advanced Practice Providers (APPs -  Physician Assistants and Nurse Practitioners) who all work together to provide you with the care you need, when you need it.  We recommend signing up for the patient portal called "MyChart".  Sign up information is provided on this After Visit Summary.  MyChart is used to connect with patients for Virtual Visits (Telemedicine).  Patients are able to view lab/test results, encounter notes, upcoming appointments, etc.  Non-urgent messages can be sent to your provider as well.   To learn more about what you can do with MyChart, go to [email or web address].    Your next appointment:   6 months  Provider:   Charlton Haws, MD     Other Instructions       1st Floor: - Lobby - Registration  - Pharmacy  - Lab - Cafe  2nd Floor: - PV Lab - Diagnostic Testing (echo, CT, nuclear med)  3rd Floor: - Vacant  4th Floor: - TCTS (cardiothoracic surgery) - AFib Clinic - Structural Heart Clinic - Vascular Surgery  - Vascular Ultrasound  5th Floor: - HeartCare Cardiology (general and EP) - Clinical Pharmacy for coumadin, hypertension, lipid, weight-loss medications, and med management appointments    Valet parking services will be available as well.

## 2024-02-07 ENCOUNTER — Ambulatory Visit: Payer: Medicare PPO | Admitting: Physician Assistant

## 2024-02-07 ENCOUNTER — Encounter: Payer: Self-pay | Admitting: Physician Assistant

## 2024-02-07 VITALS — BP 140/76 | HR 78 | Temp 97.0°F | Ht 65.0 in | Wt 150.4 lb

## 2024-02-07 DIAGNOSIS — R7303 Prediabetes: Secondary | ICD-10-CM | POA: Diagnosis not present

## 2024-02-07 DIAGNOSIS — L304 Erythema intertrigo: Secondary | ICD-10-CM

## 2024-02-07 DIAGNOSIS — I10 Essential (primary) hypertension: Secondary | ICD-10-CM | POA: Diagnosis not present

## 2024-02-07 DIAGNOSIS — R635 Abnormal weight gain: Secondary | ICD-10-CM | POA: Diagnosis not present

## 2024-02-07 LAB — POCT GLYCOSYLATED HEMOGLOBIN (HGB A1C): Hemoglobin A1C: 5.6 % (ref 4.0–5.6)

## 2024-02-07 MED ORDER — NYSTATIN 100000 UNIT/GM EX POWD: 1.0000 | Freq: Three times a day (TID) | CUTANEOUS | 0 refills | Status: AC

## 2024-02-07 MED ORDER — CLOTRIMAZOLE-BETAMETHASONE 1-0.05 % EX CREA: 1.0000 | TOPICAL_CREAM | Freq: Every day | CUTANEOUS | 1 refills | Status: AC

## 2024-02-07 NOTE — Progress Notes (Signed)
 Patient ID: [name] [name], female    DOB: 1927-08-15, 89 y.o.   MRN: [id number]   Assessment & Plan:  Prediabetes -     POCT glycosylated hemoglobin (Hb A1C)  Intertrigo -     Clotrimazole-Betamethasone; Apply 1 Application topically daily.  Dispense: 45 g; Refill: 1 -     Nystatin; Apply 1 Application topically 3 (three) times daily.  Dispense: 60 g; Refill: 0      Assessment and Plan Assessment & Plan Hypertension Blood pressure was elevated during the visit. She does not regularly monitor blood pressure at home. She is under the care of a cardiologist and has an upcoming echocardiogram scheduled in April to assess heart function. - Recheck blood pressure during the visit - Advise home blood pressure monitoring once a week - Continue follow-up with cardiologist and attend scheduled echocardiogram in April  Fluid Retention Weight increased from 139 lbs in October to 150 lbs currently, suggesting fluid retention possibly related to heart function. She is on furosemide (Lasix) every other day to manage fluid levels. Monitoring for signs of fluid overload, such as shortness of breath, leg swelling, or congestion, was advised. It was also recommended to weigh herself weekly and report if weight gain exceeds 3 lbs in a week. - Monitor for signs of fluid overload, follow with cardiologist - Advise weighing once a week and report if weight gain exceeds 3 lbs in a week - Continue furosemide (Lasix) every other day  Intertrigo Occasional irritation and redness under the breast, likely due to skin-to-skin contact and moisture accumulation, suggest a recurrent yeast rash. Using Lotrisone cream to manage symptoms was recommended. Preventive measures, such as using a handkerchief or moisture-wicking fabric under the breast to reduce moisture and friction, were advised. - Prescribe Lotrisone cream for use as needed - Advise using a handkerchief or moisture-wicking fabric under the  breast to prevent moisture accumulation  General Health Maintenance A1c level improved to 5.6 from 5.9 six months ago, indicating good blood sugar control. She has been avoiding sweet foods, contributing to this improvement. She is also taking Remeron at bedtime and occasionally uses Tylenol Arthritis for pain management. It was advised to avoid NSAIDs due to potential kidney issues. - Continue current diet avoiding sweet foods - Continue Remeron at bedtime - Use Tylenol Arthritis as needed for pain - Avoid NSAIDs due to kidney concerns      Return in about 6 months (around 08/09/2024) for recheck/follow-up.    Subjective:    Chief Complaint  Patient presents with   Medical Management of Chronic Issues    Pt in office for 6 mon f/u; pt wants to have ears looked at for wax build up; wanting to ask if she should be wearing a bra due to itching and rubbing under her breasts; pt admits to only wearing one when she goes out;     HPI Discussed the use of AI scribe software for clinical note transcription with the patient, who gave verbal consent to proceed.  History of Present Illness [name] [name] is a 89 year old female with hypertension who presents for a routine follow-up. She is here with her daughter.   During the visit, her blood pressure was elevated. She does not regularly monitor her blood pressure at home. She follows with a cardiologist and is scheduled for an echocardiogram in April. No chest pain, shortness of breath, or swelling in her legs. She has a history of hypertension.  She has experienced  a weight increase from 139 pounds in October to 150 pounds currently, despite maintaining her usual diet and exercise routine. She takes furosemide every other day for fluid management and urinates two to three times per night, which she attributes to the medication. No swelling in her legs.  She experiences occasional skin irritation under her breast, described as a red  spot or skin peeling, which she attributes to not wearing a bra daily. She uses Lotrisone cream for this condition.  She has a history of earwax buildup and recently had it removed by an audiologist. No current issues with earwax accumulation.  She takes Remeron at bedtime and occasionally takes Tylenol Arthritis every other day for pain management. She previously took [place] but discontinued it due to lack of efficacy.     Past Medical History:  Diagnosis Date   Anemia    Anxiety and depression 07/16/2009   ASD (atrial septal defect) per bubble study, 2004    Cardiomyopathy, normal cath 2007, normal EF per ECHO 2014    Hx of colonic polyps    Hypertension    Osteoarthritis    Osteopenia    Podagra 04/29/2014   RLS (restless legs syndrome) 07/10/2009    Past Surgical History:  Procedure Laterality Date   BACK SURGERY  2000   BREAST SURGERY     NEGATIVE BIOPSY   TONSILLECTOMY     TOTAL KNEE ARTHROPLASTY Right 03/22/2023   Procedure: RIGHT TOTAL KNEE ARTHROPLASTY;  Surgeon: Kathryne Hitch, MD;  Location: [facility];  Service: Orthopedics;  Laterality: Right;    Family History  Problem Relation Age of Onset   Heart attack Father    Heart disease Mother    Hypertension Other    Diabetes Neg Hx    Colon cancer Neg Hx     Social History   Tobacco Use   Smoking status: Never   Smokeless tobacco: Never  Vaping Use   Vaping status: Never Used  Substance Use Topics   Alcohol use: No    Alcohol/week: 0.0 standard drinks of alcohol   Drug use: No     Allergies  Allergen Reactions   Shellfish Allergy Anaphylaxis   Codeine Other (See Comments)    Unknown.   Adhesive [Tape] Rash    Review of Systems NEGATIVE UNLESS OTHERWISE INDICATED IN HPI      Objective:     BP (!) 140/76   Pulse 78   Temp (!) 97 F (36.1 C) (Temporal)   Ht 5\' 5"  (1.651 m)   Wt 150 lb 6.4 oz (68.2 kg)   LMP  (LMP Unknown)   SpO2 97%   BMI 25.03 kg/m   Wt Readings from Last 3  Encounters:  02/07/24 150 lb 6.4 oz (68.2 kg)  01/21/24 151 lb (68.5 kg)  01/17/24 149 lb 6.4 oz (67.8 kg)    BP Readings from Last 3 Encounters:  02/07/24 (!) 140/76  01/21/24 (!) 158/68  01/17/24 138/74     Physical Exam Vitals and nursing note reviewed.  Constitutional:      Appearance: Normal appearance.  Eyes:     Extraocular Movements: Extraocular movements intact.     Conjunctiva/sclera: Conjunctivae normal.     Pupils: Pupils are equal, round, and reactive to light.  Cardiovascular:     Rate and Rhythm: Normal rate and regular rhythm.     Pulses: Normal pulses.  Pulmonary:     Effort: Pulmonary effort is normal.     Breath sounds: Normal breath sounds.  No wheezing.  Musculoskeletal:        General: No swelling or tenderness.     Right lower leg: No edema.     Left lower leg: No edema.  Skin:    Findings: No rash.  Neurological:     General: No focal deficit present.     Mental Status: She is alert and oriented to person, place, and time.  Psychiatric:        Mood and Affect: Mood normal.        Behavior: Behavior normal.             Esmerelda Finnigan M Honorio Devol, PA-C

## 2024-02-08 ENCOUNTER — Other Ambulatory Visit (HOSPITAL_COMMUNITY): Payer: Self-pay

## 2024-02-08 ENCOUNTER — Telehealth: Payer: Self-pay

## 2024-02-08 NOTE — Telephone Encounter (Signed)
 Pharmacy Patient Advocate Encounter   Received notification from Onbase that prior authorization for Nystatin 100000 UNIT/GM powder is required/requested.   Insurance verification completed.   The patient is insured through [place] .   Per test claim: PA required; PA submitted to above mentioned insurance via CoverMyMeds Key/confirmation #/EOC [id number] Status is pending

## 2024-02-12 ENCOUNTER — Other Ambulatory Visit (HOSPITAL_COMMUNITY): Payer: Self-pay

## 2024-02-12 NOTE — Telephone Encounter (Signed)
 Pharmacy Patient Advocate Encounter  Received notification from [facility] that Prior Authorization for Nystatin 100000 UNIT/GM powder has been APPROVED from 11/14/23 to 11/12/24. Unable to obtain price due to refill too soon rejection, last fill date 02/07/24 next available fill date04/11/25   PA #/Case ID/Reference #: [id number]

## 2024-02-13 DIAGNOSIS — F3340 Major depressive disorder, recurrent, in remission, unspecified: Secondary | ICD-10-CM | POA: Diagnosis not present

## 2024-02-21 ENCOUNTER — Ambulatory Visit (HOSPITAL_COMMUNITY): Attending: Cardiovascular Disease

## 2024-02-21 DIAGNOSIS — I420 Dilated cardiomyopathy: Secondary | ICD-10-CM | POA: Insufficient documentation

## 2024-02-21 DIAGNOSIS — I10 Essential (primary) hypertension: Secondary | ICD-10-CM | POA: Insufficient documentation

## 2024-02-21 LAB — ECHOCARDIOGRAM COMPLETE
Area-P 1/2: 3.39 cm2
P 1/2 time: 423 ms
S' Lateral: 3.9 cm

## 2024-02-23 ENCOUNTER — Other Ambulatory Visit: Payer: Self-pay | Admitting: Physician Assistant

## 2024-02-23 DIAGNOSIS — I10 Essential (primary) hypertension: Secondary | ICD-10-CM

## 2024-04-21 DIAGNOSIS — H401131 Primary open-angle glaucoma, bilateral, mild stage: Secondary | ICD-10-CM | POA: Diagnosis not present

## 2024-05-08 ENCOUNTER — Other Ambulatory Visit: Payer: Self-pay

## 2024-05-08 MED ORDER — FUROSEMIDE 20 MG PO TABS
20.0000 mg | ORAL_TABLET | ORAL | 2 refills | Status: AC
Start: 2024-05-08 — End: ?

## 2024-05-08 MED ORDER — SPIRONOLACTONE 25 MG PO TABS: 25.0000 mg | ORAL_TABLET | ORAL | 2 refills | Status: AC

## 2024-06-22 ENCOUNTER — Other Ambulatory Visit: Payer: Self-pay | Admitting: Physician Assistant

## 2024-06-22 DIAGNOSIS — I10 Essential (primary) hypertension: Secondary | ICD-10-CM

## 2024-06-27 ENCOUNTER — Ambulatory Visit: Admitting: Physician Assistant

## 2024-06-27 ENCOUNTER — Encounter: Payer: Self-pay | Admitting: Physician Assistant

## 2024-06-27 VITALS — BP 168/80 | HR 85 | Temp 98.2°F | Ht 65.0 in | Wt 147.2 lb

## 2024-06-27 DIAGNOSIS — Z974 Presence of external hearing-aid: Secondary | ICD-10-CM | POA: Diagnosis not present

## 2024-06-27 DIAGNOSIS — I10 Essential (primary) hypertension: Secondary | ICD-10-CM | POA: Diagnosis not present

## 2024-06-27 DIAGNOSIS — N1832 Chronic kidney disease, stage 3b: Secondary | ICD-10-CM

## 2024-06-27 DIAGNOSIS — M65331 Trigger finger, right middle finger: Secondary | ICD-10-CM | POA: Diagnosis not present

## 2024-06-27 NOTE — Patient Instructions (Addendum)
 [facility] [address] [place], [place] [address] [phone]   VISIT SUMMARY: You had a wellness visit today and discussed several health issues, including your right middle finger trigger finger, chronic kidney disease, and hypertension. Overall, you are feeling well and very active for your age.  YOUR PLAN: RIGHT MIDDLE FINGER TRIGGER FINGER: Your right middle finger locks, especially overnight, likely due to a tendon issue. -You are referred to Dr. Vernetta at [facility] for a steroid injection to relieve pain and prevent locking.  CHRONIC KIDNEY DISEASE STAGE 3B: You have stage 3b chronic kidney disease, which requires regular monitoring. -A Comprehensive Metabolic Panel (CMP) has been ordered to assess your kidney function.  HYPERTENSION: Your blood pressure was elevated at 168/80 today. You are satisfied with your current regimen and follow up with cardiology. -Continue your follow-up with cardiology for blood pressure management.  GENERAL HEALTH MAINTENANCE: You are feeling well and very active for your age. Your hearing aids are functioning well, and there is no ear wax buildup. -Continue wearing your hearing aids. -No ear wax buildup was noted, so no action is needed.                      Contains text generated by Abridge.                                 Contains text generated by Abridge.

## 2024-06-27 NOTE — Progress Notes (Signed)
 Patient ID: [name]  [name] [name], female    DOB: 1927/05/31, 89 y.o.   MRN: [id number]   Assessment & Plan:  Trigger finger, right middle finger  Primary hypertension  Chronic kidney disease, stage 3b (HCC) -     Comprehensive metabolic panel with GFR  Wears hearing aid in both ears      Assessment and Plan Assessment & Plan  Overall feeling well and very active for her age. No ear wax noted on exam, and hearing aids are functioning well. - Reassure no ear wax buildup - Continue wearing hearing aids  Right middle finger trigger finger Right middle finger trigger finger with locking, especially overnight. Likely due to tendon getting stuck in the pulley system. Recommended treatment is a steroid injection, which is effective in relieving pain and preventing locking. Dr. Damian office confirmed she performs joint injections, which should relieve pain and prevent locking. - Refer to Dr. Vernetta at [facility] for trigger finger injection  Chronic kidney disease stage 3b Chronic kidney disease stage 3b. Requires monitoring of kidney function. - Order CMP to assess kidney function  Hypertension Blood pressure elevated at 168/80 today. She follows with cardiology, and she is satisfied with her current regimen. - Continue follow-up with cardiology      Return in about 6 months (around 12/28/2024) for recheck/follow-up.    Subjective:    Chief Complaint  Patient presents with   Cerumen Impaction   finger swelling     The middle rt finger has ben swollen and also the inside of her hands     HPI Discussed the use of AI scribe software for clinical note transcription with the patient, who gave verbal consent to proceed.  History of Present Illness Avice  [name] [name] is a 89 year old female who presents with right middle finger trigger finger.  She experiences swelling and locking of her right middle finger, which she attributes to forcing a ring onto it. The  locking typically occurs overnight, requiring her to manually straighten the finger in the morning. The symptoms have been persistent and bothersome.  She underwent knee surgery previously, which has been successful and does not currently cause her any issues. Prior to the surgery, her knee was turning inwards.  She follows with cardiology for her heart health. Her blood pressure readings are monitored, and she is on a regimen that is satisfactory.  She has chronic kidney disease and her kidney function is monitored.  She uses hearing aids in both ears and reports no issues with earwax buildup. She mentions a recent incident where she had difficulty hearing, which she attributes to a faulty battery in her hearing aid.     Past Medical History:  Diagnosis Date   Anemia    Anxiety and depression 07/16/2009   ASD (atrial septal defect) per bubble study, 2004    Cardiomyopathy, normal cath 2007, normal EF per ECHO 2014    Hx of colonic polyps    Hypertension    Osteoarthritis    Osteopenia    Podagra 04/29/2014   RLS (restless legs syndrome) 07/10/2009    Past Surgical History:  Procedure Laterality Date   BACK SURGERY  2000   BREAST SURGERY     NEGATIVE BIOPSY   TONSILLECTOMY     TOTAL KNEE ARTHROPLASTY Right 03/22/2023   Procedure: RIGHT TOTAL KNEE ARTHROPLASTY;  Surgeon: Vernetta Lonni GRADE, MD;  Location: [facility];  Service: Orthopedics;  Laterality: Right;    Family History  Problem Relation  Age of Onset   Heart attack Father    Heart disease Mother    Hypertension Other    Diabetes Neg Hx    Colon cancer Neg Hx     Social History   Tobacco Use   Smoking status: Never   Smokeless tobacco: Never  Vaping Use   Vaping status: Never Used  Substance Use Topics   Alcohol use: No    Alcohol/week: 0.0 standard drinks of alcohol   Drug use: No     Allergies  Allergen Reactions   Shellfish Allergy Anaphylaxis   Codeine Other (See Comments)    Unknown.   Adhesive  [Tape] Rash    Review of Systems NEGATIVE UNLESS OTHERWISE INDICATED IN HPI      Objective:     BP (!) 168/80   Pulse 85   Temp 98.2 F (36.8 C)   Ht 5' 5 (1.651 m)   Wt 147 lb 3.2 oz (66.8 kg)   LMP  (LMP Unknown)   SpO2 99%   BMI 24.50 kg/m   Wt Readings from Last 3 Encounters:  06/27/24 147 lb 3.2 oz (66.8 kg)  02/07/24 150 lb 6.4 oz (68.2 kg)  01/21/24 151 lb (68.5 kg)    BP Readings from Last 3 Encounters:  06/27/24 (!) 168/80  02/07/24 (!) 140/76  01/21/24 (!) 158/68     Physical Exam Vitals and nursing note reviewed.  Constitutional:      Appearance: Normal appearance.  HENT:     Right Ear: Tympanic membrane, ear canal and external ear normal. There is no impacted cerumen.     Left Ear: Tympanic membrane, ear canal and external ear normal. There is no impacted cerumen.  Eyes:     Extraocular Movements: Extraocular movements intact.     Conjunctiva/sclera: Conjunctivae normal.     Pupils: Pupils are equal, round, and reactive to light.  Cardiovascular:     Rate and Rhythm: Normal rate and regular rhythm.     Pulses: Normal pulses.  Pulmonary:     Effort: Pulmonary effort is normal.     Breath sounds: Normal breath sounds. No wheezing.  Musculoskeletal:        General: Tenderness (R middle finger, trigger finger) present. No swelling.     Right lower leg: No edema.     Left lower leg: No edema.  Skin:    Findings: No rash.  Neurological:     General: No focal deficit present.     Mental Status: She is alert and oriented to person, place, and time.  Psychiatric:        Mood and Affect: Mood normal.        Behavior: Behavior normal.             Priscilla Finklea M Stephania Macfarlane, PA-C

## 2024-06-28 LAB — COMPREHENSIVE METABOLIC PANEL WITH GFR
AG Ratio: 1.9 (calc) (ref 1.0–2.5)
ALT: 6 U/L (ref 6–29)
AST: 13 U/L (ref 10–35)
Albumin: 4.2 g/dL (ref 3.6–5.1)
Alkaline phosphatase (APISO): 55 U/L (ref 37–153)
BUN/Creatinine Ratio: 17 (calc) (ref 6–22)
BUN: 29 mg/dL — ABNORMAL HIGH (ref 7–25)
CO2: 23 mmol/L (ref 20–32)
Calcium: 9.3 mg/dL (ref 8.6–10.4)
Chloride: 107 mmol/L (ref 98–110)
Creat: 1.73 mg/dL — ABNORMAL HIGH (ref 0.60–0.95)
Globulin: 2.2 g/dL (ref 1.9–3.7)
Glucose, Bld: 105 mg/dL — ABNORMAL HIGH (ref 65–99)
Potassium: 4.4 mmol/L (ref 3.5–5.3)
Sodium: 142 mmol/L (ref 135–146)
Total Bilirubin: 0.4 mg/dL (ref 0.2–1.2)
Total Protein: 6.4 g/dL (ref 6.1–8.1)
eGFR: 27 mL/min/1.73m2 — ABNORMAL LOW (ref >=60)

## 2024-07-01 ENCOUNTER — Ambulatory Visit: Payer: Self-pay | Admitting: Physician Assistant

## 2024-07-01 ENCOUNTER — Other Ambulatory Visit: Payer: Self-pay | Admitting: Physician Assistant

## 2024-07-01 DIAGNOSIS — N1832 Chronic kidney disease, stage 3b: Secondary | ICD-10-CM

## 2024-07-03 ENCOUNTER — Encounter: Payer: Self-pay | Admitting: Physician Assistant

## 2024-07-03 NOTE — Telephone Encounter (Signed)
 Please see pt msg and advise

## 2024-07-15 ENCOUNTER — Telehealth: Payer: Self-pay | Admitting: *Deleted

## 2024-07-15 NOTE — Progress Notes (Unsigned)
 Care Guide Pharmacy Note  07/15/2024 Name: [name]  [name] [name] MRN: [id number] DOB: 1927/04/04  Referred By: Kathrene Mardy CHRISTELLA, PA-C Reason for referral: Call Attempt #1 and Complex Care Management (Outreach to schedule referral with pharmacist )   [name]  [name] [name] is a 89 y.o. year old female who is a primary care patient of Allwardt, Alyssa M, PA-C.  [name]  [name] [name] was referred to the pharmacist for assistance related to: CKD Stage 3  An unsuccessful telephone outreach was attempted today to contact the patient who was referred to the pharmacy team for assistance with medication management. Additional attempts will be made to contact the patient.  Thedford Franks, CMA [place]  [facility], [facility] Guide Direct Dial: [phone]  Fax: [phone] Website: [email or web address]

## 2024-07-15 NOTE — Progress Notes (Deleted)
 Date:  07/15/2024   ID:  [name]  [name], [name] [name] 1927/02/12, MRN [id number]  Provider Location: Office  PCP:  Kathrene Mardy CHRISTELLA, PA-C  Cardiologist:  Maude Emmer, MD   Electrophysiologist:  None   Evaluation Performed:  Follow-Up Visit  Chief Complaint:  Chronic Systolic CHF  History of Present Illness:    89 y.o. non ischemic DCM normal cath back in 2007 with EF at that time 35%  EF decreased to 25-30% on TTE 12/17/18 and aldactone  and entresto  added. F/U TTE done 04/24/22  showed improvement to 40-45%  Most recent TTE 02/21/24 stable with EF 40-45% and moderate MR  She is swimming at [employer] to finish a memoir about the town [place]  She is a [employer] grad   Lives with only daughter and has 4 grand children   She had right TKR with Dr Vernetta 03/22/23 She has done well despite her age   ***  Past Medical History:  Diagnosis Date   Anemia    Anxiety and depression 07/16/2009   ASD (atrial septal defect) per bubble study, 2004    Cardiomyopathy, normal cath 2007, normal EF per ECHO 2014    Hx of colonic polyps    Hypertension    Osteoarthritis    Osteopenia    Podagra 04/29/2014   RLS (restless legs syndrome) 07/10/2009   Past Surgical History:  Procedure Laterality Date   BACK SURGERY  2000   BREAST SURGERY     NEGATIVE BIOPSY   TONSILLECTOMY     TOTAL KNEE ARTHROPLASTY Right 03/22/2023   Procedure: RIGHT TOTAL KNEE ARTHROPLASTY;  Surgeon: Vernetta Lonni GRADE, MD;  Location: [facility];  Service: Orthopedics;  Laterality: Right;     No outpatient medications have been marked as taking for the 07/21/24 encounter (Appointment) with Jiovanni Heeter C, MD.     Allergies:   Shellfish allergy, Codeine, and Adhesive [tape]   Social History   Tobacco Use   Smoking status: Never   Smokeless tobacco: Never  Vaping Use   Vaping status: Never Used  Substance Use Topics   Alcohol use: No    Alcohol/week: 0.0 standard drinks of alcohol   Drug use: No      Family Hx: The patient's family history includes Heart attack in her father; Heart disease in her mother; Hypertension in an other family member. There is no history of Diabetes or Colon cancer.  ROS:   Please see the history of present illness.     All other systems reviewed and are negative.   Prior CV studies:   The following studies were reviewed today:  Echo 12/17/18 Echo 04/21/20   Labs/Other Tests and Data Reviewed:    EKG:   07/15/2024 SR rate 77 nonspecific ST changes   Recent Labs: 08/09/2023: Hemoglobin 11.6; Platelets 215.0 06/27/2024: ALT 6; BUN 29; Creat 1.73; Potassium 4.4; Sodium 142   Recent Lipid Panel Lab Results  Component Value Date/Time   CHOL 215 (H) 02/01/2023 10:37 AM   TRIG 86.0 02/01/2023 10:37 AM   HDL 77.60 02/01/2023 10:37 AM   CHOLHDL 3 02/01/2023 10:37 AM   LDLCALC 120 (H) 02/01/2023 10:37 AM   LDLCALC 103 (H) 08/23/2020 02:26 PM   LDLDIRECT 116.5 11/08/2012 09:35 AM    Wt Readings from Last 3 Encounters:  06/27/24 147 lb 3.2 oz (66.8 kg)  02/07/24 150 lb 6.4 oz (68.2 kg)  01/21/24 151 lb (68.5 kg)     Objective:    Vital Signs:  LMP  (LMP Unknown)  My reading after 15 minutes 138/80 mmHg   Affect appropriate Healthy:  appears stated age HEENT: normal Neck supple with no adenopathy JVP normal no bruits no thyromegaly Lungs clear with no wheezing and good diaphragmatic motion Heart:  S1/S2 Apical MR murmur, no rub, gallop or click PMI enlarged  Abdomen: benighn, BS positve, no tenderness, no AAA no bruit.  No HSM or HJR Distal pulses intact with no bruits No edema Neuro non-focal Skin warm and dry Post right TKR    ASSESSMENT & PLAN:    HTN: improved with entresto    DCM: EF stable 40-45% by TTE 02/21/24 moderate MR continue medical Rx given age.  Coreg , entresto , aldactone  and lasix   PVC asymptomatic better with beta blocker   Anxiety/Depression  Stable continue welburin   Ortho:  tricompartmental osteoarthritis  with chondrocalcinosis Post right TKR with Dr Vernetta 03/22/23    Medication Adjustments/Labs and Tests Ordered:  None   Tests Ordered:  None   Medication Changes: No orders of the defined types were placed in this encounter.   Disposition:  Follow up in  a year   Signed, Maude Emmer, MD  07/15/2024 10:48 AM    [facility]

## 2024-07-16 NOTE — Progress Notes (Unsigned)
 Care Guide Pharmacy Note  07/16/2024 Name: [name]  [name] MRN: [id number] DOB: 1927-09-24  Referred By: Kathrene Mardy CHRISTELLA, PA-C Reason for referral: Call Attempt #1 and Complex Care Management (Outreach to schedule referral with pharmacist )   [name]  TABRINA [name] is a 89 y.o. year old female who is a primary care patient of Allwardt, Alyssa M, PA-C.  [name]  CHRISTELLA [name] was referred to the pharmacist for assistance related to: CKD Stage 3  A second unsuccessful telephone outreach was attempted today to contact the patient who was referred to the pharmacy team for assistance with medication management. Additional attempts will be made to contact the patient.  Thedford Franks, CMA, Care Guide [facility]  [facility], [facility] Guide Direct Dial: [phone]  Fax: [phone] Website: [email or web address]

## 2024-07-17 NOTE — Progress Notes (Signed)
 Care Guide Pharmacy Note  07/17/2024 Name: Carrissa  MARNESHA GAGEN MRN: [id number] DOB: 11/06/1927  Referred By: Kathrene Mardy CHRISTELLA, PA-C Reason for referral: Call Attempt #1 and Complex Care Management (Outreach to schedule referral with pharmacist )   Lilliemae  JANYLA BISCOE is a 89 y.o. year old female who is a primary care patient of Allwardt, Alyssa M, PA-C.  Halea  CHRISTELLA Blizard was referred to the pharmacist for assistance related to: CKD Stage 3  A third unsuccessful telephone outreach was attempted today to contact the patient who was referred to the pharmacy team for assistance with medication management. The Population Health team is pleased to engage with this patient at any time in the future upon receipt of referral and should he/she be interested in assistance from the Population Health team.  Thedford Franks, CMA [facility]  [facility], [facility] Guide Direct Dial: [phone]  Fax: [phone] Website: [email or web address]

## 2024-07-21 ENCOUNTER — Ambulatory Visit: Admitting: Cardiovascular Disease

## 2024-07-29 ENCOUNTER — Other Ambulatory Visit: Payer: Self-pay | Admitting: Physician Assistant

## 2024-07-29 DIAGNOSIS — I10 Essential (primary) hypertension: Secondary | ICD-10-CM

## 2024-07-30 ENCOUNTER — Encounter (HOSPITAL_BASED_OUTPATIENT_CLINIC_OR_DEPARTMENT_OTHER): Payer: Self-pay

## 2024-07-30 NOTE — Progress Notes (Unsigned)
 Cardiology Office Note    Patient Name: [name] [name]  [name] [name] [name] Date of Encounter: 07/30/2024  Primary Care Provider:  Allwardt, Mardy CHRISTELLA, PA-C Primary Cardiologist:  Maude Emmer, MD Primary Electrophysiologist: None   Past Medical History    Past Medical History:  Diagnosis Date   Anemia    Anxiety and depression 07/16/2009   ASD (atrial septal defect) per bubble study, 2004    Cardiomyopathy, normal cath 2007, normal EF per ECHO 2014    Hx of colonic polyps    Hypertension    Osteoarthritis    Osteopenia    Podagra 04/29/2014   RLS (restless legs syndrome) 07/10/2009    History of Present Illness  [name] [name]  CHRISTELLA [name] is a 89 y.o. female with a PMH of HFrEF, nonischemic DCM, HTN, PVCs  [name] [name] [name] has been followed by Dr.Nishan for management of cardiomyopathy for many years.  She completed a left heart cath in 2007 that was normal and completed a 2D echo 03/15/2017 that showed an EF of 35% with mild MR and PASP of 43 mmHg.  She is currently on GDMT with Entresto , carvedilol , and spironolactone  with Lasix  for symptom management.  She underwent a right TKR by Dr. Vernetta on 03/22/2023 and has done well.  She was last seen on 01/21/2024 for follow-up.  She is currently living with her daughter and swimming at the [facility]  She had an updated 2D echo completed on 02/21/24 that showed EF of 40-45% with global hypokinesis and grade 1 DD and moderate MVR with no significant change from prior completed in 2023.  She was recommended to continue current medical therapy with no adjustments at this time.  [name] [name] [name] presents today with her daughter for 56-month follow-up.  Since her last visit, she has not experienced significant symptoms such as shortness of breath or chest pain. She experiences some swelling in her ankles, particularly noticeable at night when she removes her socks, but it improves by morning. There is no significant swelling up the legs. She is currently taking Lasix   without any issues and maintains her fluid intake at one liter per day. Her weight has been stable, with a slight decrease from 151 pounds in March 2025 to 149 pounds currently. She adheres to a low-salt diet and engages in regular physical activity, participating in [employer] online exercises every day except weekends. She reports no issues with sleep, stating she feels rested upon waking and denies any symptoms of sleep apnea. She occasionally wakes up at night to use the bathroom but returns to sleep without difficulty. No chest tightness, pressure, or palpitations are reported. She has a history of extra heartbeats (PVCs) but has not felt any recently. No issues with dizziness or lightheadedness are reported.  Discussed the use of AI scribe software for clinical note transcription with the patient, who gave verbal consent to proceed.  History of Present Illness   Review of Systems  Please see the history of present illness.    All other systems reviewed and are otherwise negative except as noted above.  Physical Exam    Wt Readings from Last 3 Encounters:  06/27/24 147 lb 3.2 oz (66.8 kg)  02/07/24 150 lb 6.4 oz (68.2 kg)  01/21/24 151 lb (68.5 kg)   CD:Uyzmz were no vitals filed for this visit.,There is no height or weight on file to calculate BMI. GEN: Well nourished, well developed in no acute distress Neck: No JVD; No carotid bruits Pulmonary: Clear to auscultation without rales, wheezing or  rhonchi  Cardiovascular: Normal rate. Regular rhythm. Normal S1. Normal S2.   Murmurs: There is no murmur.  ABDOMEN: Soft, non-tender, non-distended EXTREMITIES: Trace bilateral edema  EKG/LABS/ Recent Cardiac Studies   ECG personally reviewed by me today -none completed today  Risk Assessment/Calculations:        Lab Results  Component Value Date   WBC 6.0 08/09/2023   HGB 11.6 (L) 08/09/2023   HCT 35.0 (L) 08/09/2023   MCV 96.6 08/09/2023   PLT 215.0 08/09/2023   Lab  Results  Component Value Date   CREATININE 1.73 (H) 06/27/2024   BUN 29 (H) 06/27/2024   NA 142 06/27/2024   K 4.4 06/27/2024   CL 107 06/27/2024   CO2 23 06/27/2024   Lab Results  Component Value Date   CHOL 215 (H) 02/01/2023   HDL 77.60 02/01/2023   LDLCALC 120 (H) 02/01/2023   LDLDIRECT 116.5 11/08/2012   TRIG 86.0 02/01/2023   CHOLHDL 3 02/01/2023    Lab Results  Component Value Date   HGBA1C 5.6 02/07/2024   Assessment & Plan    Assessment and Plan Assessment & Plan  1.  HFrEF/NICM: - 2D echo completed on 02/21/2024 showing moderate MR with EF of 40-45% and grade 1 DD with global hypokinesis recommendation to continue medical therapy. - Patient is euvolemic no complaints of shortness of breath - Continue current GDMT with carvedilol  25 mg twice daily, Lasix  20 mg every other day Entresto  97/103 mg twice daily spironolactone  25 mg daily -Low sodium diet, fluid restriction <2L, and daily weights encouraged. Educated to contact our office for weight gain of 2 lbs overnight or 5 lbs in one week.   2.  Essential HTN: - Patient's blood pressure today was 142/68 - Continue carvedilol  25 mg twice daily, Entresto  97/103 mg twice daily, spironolactone  25 mg daily - Monitor blood pressure at home and report any significant increases.  3.  PVCs: -Occasional asymptomatic PVCs with no intervention required. - Monitor for symptoms such as dizziness or palpitations and report if she occurs.  4.CKD stage IIIb - Continue monitoring kidney function. - Maintain adequate hydration. - Control blood pressure.  Disposition: Follow-up with Maude Emmer, MD or APP in 6 months   Signed, Wyn Raddle, Jackee Shove, NP 07/30/2024, 11:04 AM [facility]

## 2024-07-31 ENCOUNTER — Ambulatory Visit: Attending: Nurse Practitioner | Admitting: Nurse Practitioner

## 2024-07-31 ENCOUNTER — Encounter: Payer: Self-pay | Admitting: Nurse Practitioner

## 2024-07-31 VITALS — BP 142/68 | HR 77 | Ht 65.0 in | Wt 149.0 lb

## 2024-07-31 DIAGNOSIS — I5020 Unspecified systolic (congestive) heart failure: Secondary | ICD-10-CM | POA: Diagnosis not present

## 2024-07-31 DIAGNOSIS — I420 Dilated cardiomyopathy: Secondary | ICD-10-CM | POA: Diagnosis not present

## 2024-07-31 DIAGNOSIS — N1832 Chronic kidney disease, stage 3b: Secondary | ICD-10-CM

## 2024-07-31 DIAGNOSIS — I10 Essential (primary) hypertension: Secondary | ICD-10-CM | POA: Diagnosis not present

## 2024-07-31 DIAGNOSIS — I493 Ventricular premature depolarization: Secondary | ICD-10-CM

## 2024-07-31 NOTE — Patient Instructions (Signed)
 Medication Instructions:  Your physician recommends that you continue on your current medications as directed. Please refer to the Current Medication list given to you today.  *If you need a refill on your cardiac medications before your next appointment, please call your pharmacy*  Follow-Up: At [facility], you and your health needs are our priority.  As part of our continuing mission to provide you with exceptional heart care, our providers are all part of one team.  This team includes your primary Cardiologist (physician) and Advanced Practice Providers or APPs (Physician Assistants and Nurse Practitioners) who all work together to provide you with the care you need, when you need it.  Your next appointment:   6 month(s)  Provider:   Maude Emmer, MD

## 2024-08-04 ENCOUNTER — Encounter: Payer: Self-pay | Admitting: Orthopaedic Surgery

## 2024-08-04 ENCOUNTER — Ambulatory Visit: Admitting: Orthopaedic Surgery

## 2024-08-04 DIAGNOSIS — M65331 Trigger finger, right middle finger: Secondary | ICD-10-CM

## 2024-08-04 MED ORDER — METHYLPREDNISOLONE ACETATE 40 MG/ML IJ SUSP
40.0000 mg | INTRAMUSCULAR | Status: AC | PRN
  Administered 2024-08-04: 40 mg

## 2024-08-04 MED ORDER — LIDOCAINE HCL 1 % IJ SOLN
1.0000 mL | INTRAMUSCULAR | Status: AC | PRN
  Administered 2024-08-04: 1 mL

## 2024-08-04 NOTE — Progress Notes (Signed)
 The patient is a 89 year old well-known to us .  We replaced her right knee secondary arthritis a year and a half ago and that has done very well.  Recently she has been having triggering with locking catching of her right middle finger.  She is also had some popping of her left knee with no pain in the left knee.  Examination of her right hand does show pain over the A1 pulley of the middle finger with active triggering.  Her left knee has some patellofemoral chondromalacia but no significant pain.  I did recommend a steroid injection in her right hand over the A1 pulley of the middle finger and she agreed to this and tolerated well.  From a knee standpoint for her left knee, she should just work on quad strengthening exercises but really nothing else to recommend for the patellofemoral arthritis unless she starts having pain with that knee.  They agree with this treatment plan.  All questions and concerns were answered and addressed.  If the trigger injection does not help, we could always repeat this again in 6 to 8 weeks or consider surgery.  Have also recommended a small amount of Voltaren  gel over the A1 pulley twice daily for the next few weeks.    Procedure Note  Patient: [name] [name]  KERRINGTON SOVA             Date of Birth: June 29, 1927           MRN: [id number]             Visit Date: 08/04/2024  Procedures: Visit Diagnoses:  1. Trigger finger, right middle finger     Hand/UE Inj: R long A1 for trigger finger on 08/04/2024 3:02 PM Medications: 1 mL lidocaine  1 %; 40 mg methylPREDNISolone  acetate 40 MG/ML

## 2024-09-10 DIAGNOSIS — F3340 Major depressive disorder, recurrent, in remission, unspecified: Secondary | ICD-10-CM | POA: Diagnosis not present

## 2024-09-15 ENCOUNTER — Encounter: Payer: Self-pay | Admitting: Radiology

## 2024-09-15 ENCOUNTER — Telehealth: Payer: Self-pay | Admitting: Cardiovascular Disease

## 2024-09-15 NOTE — Telephone Encounter (Signed)
*  STAT* If patient is at the pharmacy, call can be transferred to refill team.   1. Which medications need to be refilled? (please list name of each medication and dose if known)   sacubitril -valsartan  (ENTRESTO ) 97-103 MG   2. Would you like to learn more about the convenience, safety, & potential cost savings by using the [facility]?   3. Are you open to using the [facility] (Type [facility]. ).  4. Which pharmacy/location (including street and city if local pharmacy) is medication to be sent to?  [id number] - [place],  - [address]   5. Do they need a 30 day or 90 day supply?   60 day  Daughter Marceil) stated patient still has a little medication left.

## 2024-09-16 MED ORDER — SACUBITRIL-VALSARTAN 97-103 MG PO TABS: 1.0000 | ORAL_TABLET | Freq: Two times a day (BID) | ORAL | 1 refills | Status: DC

## 2024-09-16 NOTE — Telephone Encounter (Signed)
 Refill sent in

## 2024-09-25 ENCOUNTER — Telehealth: Payer: Self-pay | Admitting: Cardiovascular Disease

## 2024-09-25 NOTE — Telephone Encounter (Signed)
 Pt c/o medication issue:  1. Name of Medication:   sacubitril -valsartan  (ENTRESTO ) 97-103 MG    2. How are you currently taking this medication (dosage and times per day)?    3. Are you having a reaction (difficulty breathing--STAT)? no  4. What is your medication issue? States a prior shara is needed for medication. Please advise

## 2024-09-26 ENCOUNTER — Telehealth: Payer: Self-pay | Admitting: Pharmacy Technician

## 2024-09-26 ENCOUNTER — Other Ambulatory Visit (HOSPITAL_COMMUNITY): Payer: Self-pay

## 2024-09-26 NOTE — Telephone Encounter (Signed)
    Ran test claim for sacubitril -valsartan  97/103mg . For a 90 day supply and the co-pay is 0.00 . PA is not needed at this time. Nothing saying this is a transition fill.   This test claim was processed through [facility]- copay amounts may vary at other pharmacies due to pharmacy/plan contracts, or as the patient moves through the different stages of their insurance plan.     I called [facility] and left a message since they were closed

## 2024-09-26 NOTE — Telephone Encounter (Signed)
 No pa needed. I called and notified [facility] and messaged patient

## 2024-09-29 ENCOUNTER — Other Ambulatory Visit (HOSPITAL_COMMUNITY): Payer: Self-pay

## 2024-09-29 ENCOUNTER — Telehealth: Payer: Self-pay | Admitting: Pharmacy Technician

## 2024-09-29 MED ORDER — ENTRESTO 97-103 MG PO TABS: 1.0000 | ORAL_TABLET | Freq: Two times a day (BID) | ORAL | 1 refills | Status: AC

## 2024-09-29 NOTE — Telephone Encounter (Signed)
 Hi, the patient's daughter called and asked for the patient to stay on brand entresto . Can someone please send in brand only dispense as written-entresto  to [facility] please? She is out of medication. Thank you  Per CMM [id number] for entresto 

## 2024-09-29 NOTE — Telephone Encounter (Signed)
 Refill for BRAND NAME ONLY Sl[place] has been sent.

## 2024-09-29 NOTE — Addendum Note (Signed)
 Addended by: MEMORY DELON POUR on: 09/29/2024 10:04 AM   Modules accepted: Orders

## 2024-10-17 ENCOUNTER — Ambulatory Visit: Payer: Self-pay

## 2024-10-17 NOTE — Telephone Encounter (Signed)
 FYI Only or Action Required?: Action required by provider: clinical question for provider.  Patient was last seen in primary care on 06/27/2024 by Allwardt, Mardy HERO, PA-C.  Called Nurse Triage reporting No chief complaint on file..  Symptoms began several days ago.  Interventions attempted: Rest, hydration, or home remedies.  Symptoms are: unchanged.  Triage Disposition: Home Care  Patient/caregiver understands and will follow disposition?: Yes  Copied from CRM #[id number]. Topic: Clinical - Red Word Triage >> Oct 17, 2024  1:39 PM Alfonso HERO wrote: Red Word that prompted transfer to Nurse Triage: daughter calling because patient has diarrhea and because of her age and is highly concerned Reason for Disposition  [1] MILD diarrhea AND [2] taking antibiotics  Answer Assessment - Initial Assessment Questions The patient's requesting   1. ANTIBIOTIC: What antibiotic are you taking? How many times per day?     Amoxicillin  2. ANTIBIOTIC ONSET: When was the antibiotic started?     Monday  3. DIARRHEA SEVERITY: How bad is the diarrhea? How many more stools have you had in the past 24 hours than normal?      Multiple, Several movements  4. ONSET: When did the diarrhea begin?      Wednesday Night  5. BM CONSISTENCY: How loose or watery is the diarrhea?      Loose and Watery  6. VOMITING: Are you also vomiting? If Yes, ask: How many times in the past 24 hours?      No  7. ABDOMEN PAIN: Are you having any abdomen pain? If Yes, ask: What does it feel like? (e.g., crampy, dull, intermittent, constant)      No  8. ABDOMEN PAIN SEVERITY: If present, ask: How bad is the pain?  (e.g., Scale 1-10; mild, moderate, or severe)     No  9. ORAL INTAKE: If vomiting, Have you been able to drink liquids? How much liquids have you had in the past 24 hours?     One liter fluid restriction due to heart   10. HYDRATION: Any signs of dehydration? (e.g., dry mouth [not  just dry lips], too weak to stand, dizziness, new weight loss) When did you last urinate?       Denies  11. EXPOSURE: Have you traveled to a foreign country recently? Have you been exposed to anyone with diarrhea? Could you have eaten any food that was spoiled?       No  12. OTHER SYMPTOMS: Do you have any other symptoms? (e.g., fever, blood in stool)       Denies any other symptoms  13. PREGNANCY: Is there any chance you are pregnant? When was your last menstrual period?       No and No  Protocols used: Diarrhea on Antibiotics-A-AH

## 2024-10-17 NOTE — Telephone Encounter (Signed)
 Was triaged earlier today for similar symptoms.   Copied from CRM [id number]. Topic: Clinical - Medical Advice >> Oct 17, 2024  9:36 AM Ashley R wrote: Reason for CRM: pt started taking Amoxicillin post oral surgery, causing diarrhea. Stopped and started Clindamycin instead but still having diarrhea. Oral surgeon suggested continuing and starting a probiotic. Would like advice on how to proceed. Pt daugher Freddy Rushing [phone]

## 2024-10-17 NOTE — Telephone Encounter (Signed)
Please see triage note and advise.  ?

## 2024-10-20 NOTE — Telephone Encounter (Signed)
 Spoke with patient daughter and was advised pt is much better since taking probiotic. Pt will follow instructions provided and complete Clindamycin with probiotics. Pt and daughter advised if there is any change or pt needs anything to reach out to us  here in the office. Pt verbalized understanding

## 2024-12-18 ENCOUNTER — Other Ambulatory Visit: Payer: Self-pay | Admitting: Physician Assistant

## 2024-12-18 DIAGNOSIS — I10 Essential (primary) hypertension: Secondary | ICD-10-CM

## 2024-12-29 ENCOUNTER — Ambulatory Visit: Admitting: Physician Assistant

## 2025-01-20 ENCOUNTER — Ambulatory Visit
